# Patient Record
Sex: Male | Born: 1935 | Race: White | Hispanic: No | Marital: Married | Smoking: Former smoker
Health system: Southern US, Community
[De-identification: ages and names within clinical notes are randomized; demographics above are authoritative.]

## PROBLEM LIST (undated history)

## (undated) DIAGNOSIS — C801 Malignant (primary) neoplasm, unspecified: Secondary | ICD-10-CM

## (undated) DIAGNOSIS — I509 Heart failure, unspecified: Secondary | ICD-10-CM

## (undated) DIAGNOSIS — I82409 Acute embolism and thrombosis of unspecified deep veins of unspecified lower extremity: Secondary | ICD-10-CM

## (undated) DIAGNOSIS — D759 Disease of blood and blood-forming organs, unspecified: Secondary | ICD-10-CM

## (undated) DIAGNOSIS — I739 Peripheral vascular disease, unspecified: Secondary | ICD-10-CM

## (undated) DIAGNOSIS — K219 Gastro-esophageal reflux disease without esophagitis: Secondary | ICD-10-CM

## (undated) HISTORY — PX: BREAST SURGERY: SHX581

## (undated) HISTORY — PX: HERNIA REPAIR: SHX51

---

## 2005-04-05 ENCOUNTER — Ambulatory Visit: Payer: Self-pay | Admitting: Unknown Physician Specialty

## 2005-05-10 ENCOUNTER — Other Ambulatory Visit: Payer: Self-pay

## 2005-05-13 ENCOUNTER — Inpatient Hospital Stay: Payer: Self-pay | Admitting: Unknown Physician Specialty

## 2005-06-04 ENCOUNTER — Ambulatory Visit: Payer: Self-pay | Admitting: Surgery

## 2005-06-08 ENCOUNTER — Encounter: Payer: Self-pay | Admitting: Unknown Physician Specialty

## 2005-06-17 ENCOUNTER — Encounter: Payer: Self-pay | Admitting: Unknown Physician Specialty

## 2005-08-02 ENCOUNTER — Encounter: Payer: Self-pay | Admitting: Unknown Physician Specialty

## 2005-08-16 ENCOUNTER — Ambulatory Visit: Payer: Self-pay | Admitting: Urology

## 2005-08-27 ENCOUNTER — Ambulatory Visit: Payer: Self-pay | Admitting: Urology

## 2006-03-22 ENCOUNTER — Ambulatory Visit: Payer: Self-pay | Admitting: Urology

## 2006-07-18 ENCOUNTER — Ambulatory Visit: Payer: Self-pay | Admitting: Surgery

## 2007-02-09 ENCOUNTER — Ambulatory Visit: Payer: Self-pay | Admitting: Surgery

## 2007-02-14 ENCOUNTER — Ambulatory Visit: Payer: Self-pay | Admitting: Surgery

## 2007-06-27 ENCOUNTER — Ambulatory Visit: Payer: Self-pay | Admitting: Gastroenterology

## 2007-06-27 ENCOUNTER — Ambulatory Visit: Payer: Self-pay | Admitting: Unknown Physician Specialty

## 2007-12-07 IMAGING — CT CT ABDOMEN AND PELVIS WITHOUT AND WITH CONTRAST
2 of 4 series · 14 of 32 positions shown, 19 images · non-contrast
Comparison: none

REASON FOR EXAM: Renal Abnormalities seen on US [DATE] hyperechoic area
midpole of kidney
COMMENTS:

PROCEDURE:     CT  - CT ABDOMEN / PELVIS  W/WO  - August 27, 2005  [DATE]
RESULT:
REASON FOR CONSULTATION:   Renal abnormalities noted on Ultrasound of
08/16/05.

[Series 4: with · axial · 0.71mm/px · z∈[+282,+618]mm · 8 of 55 slices shown, 13 images]
[im 7/55  soft-tissue]
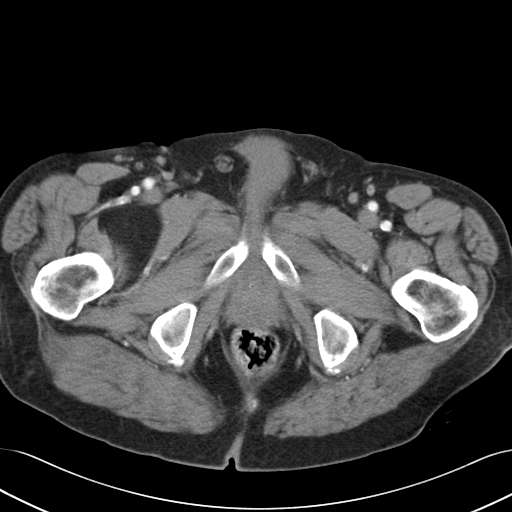
[im 7/55  bone]
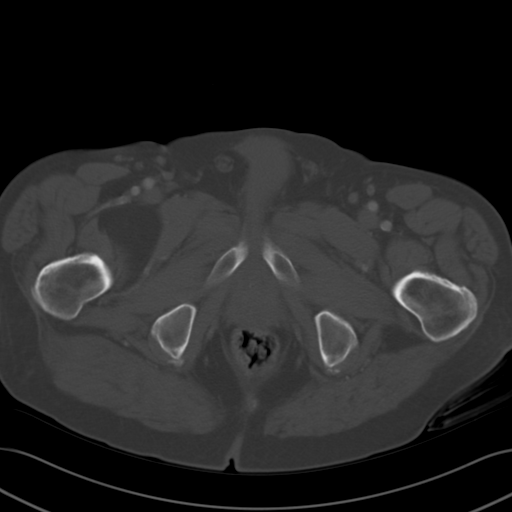
[im 13/55  soft-tissue]
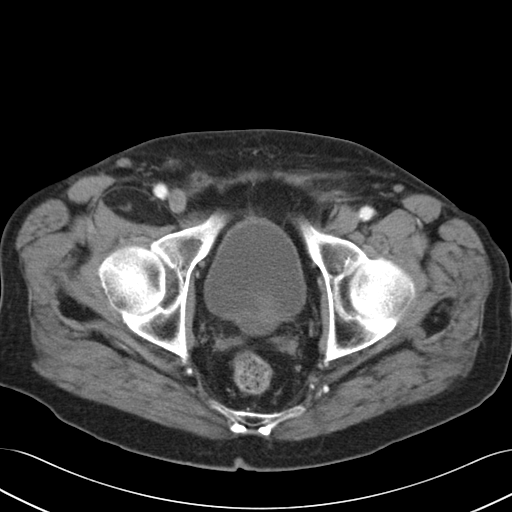
[im 19/55  soft-tissue]
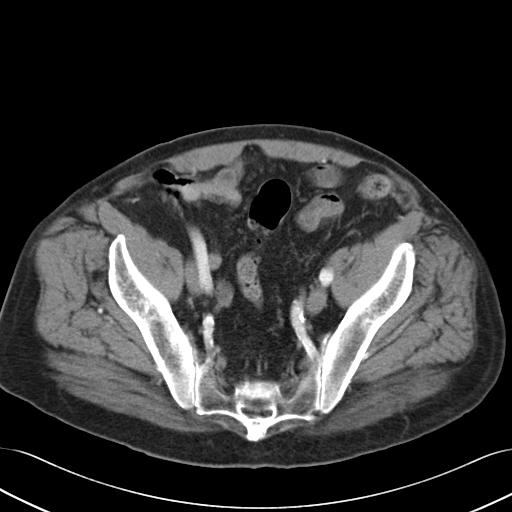
[im 25/55  soft-tissue]
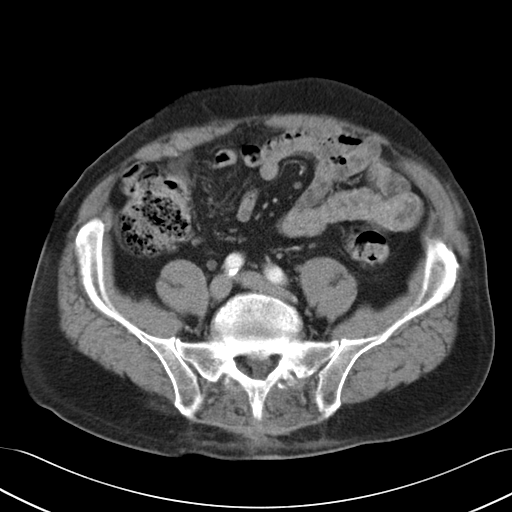
[im 31/55  soft-tissue]
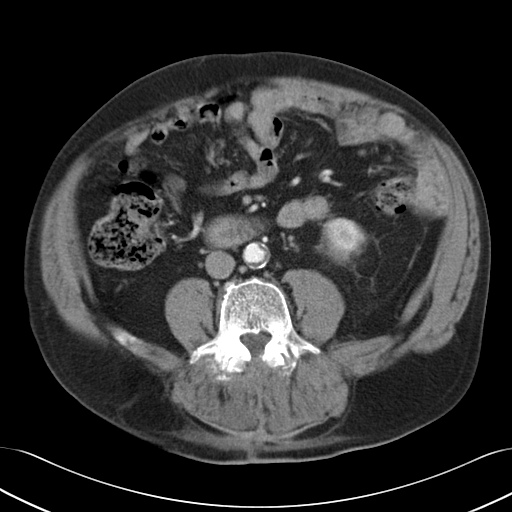
[im 31/55  lung]
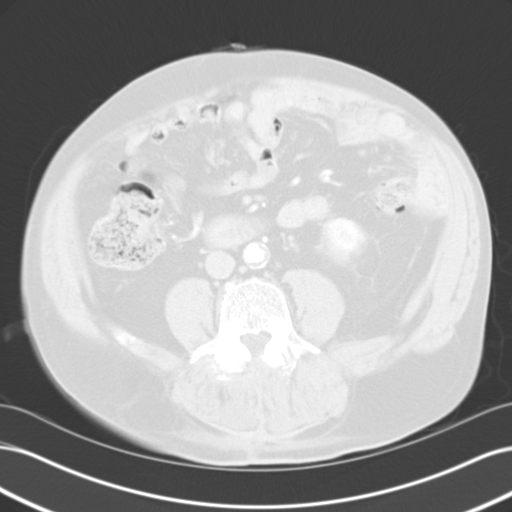
[im 37/55  soft-tissue]
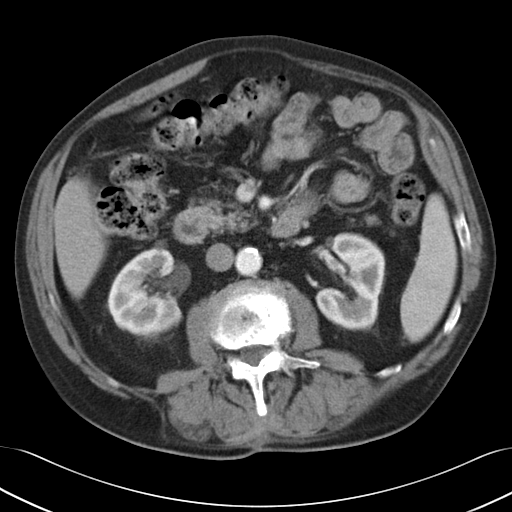
[im 37/55  lung]
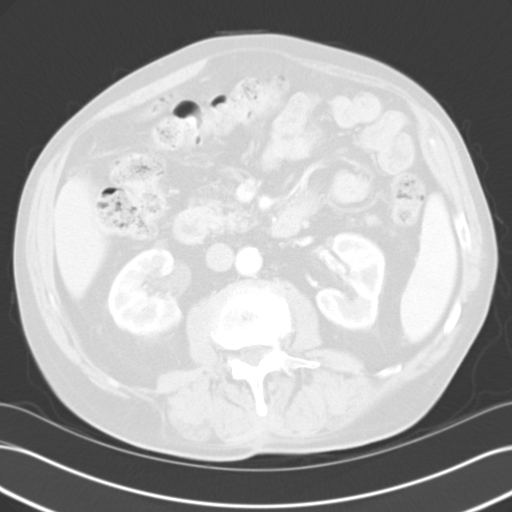
[im 43/55  soft-tissue]
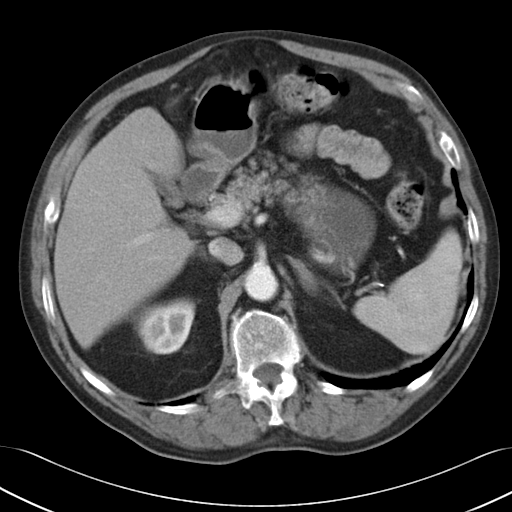
[im 43/55  lung]
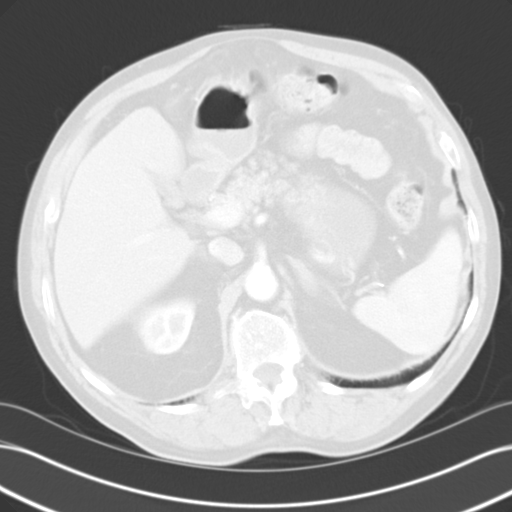
[im 49/55  soft-tissue]
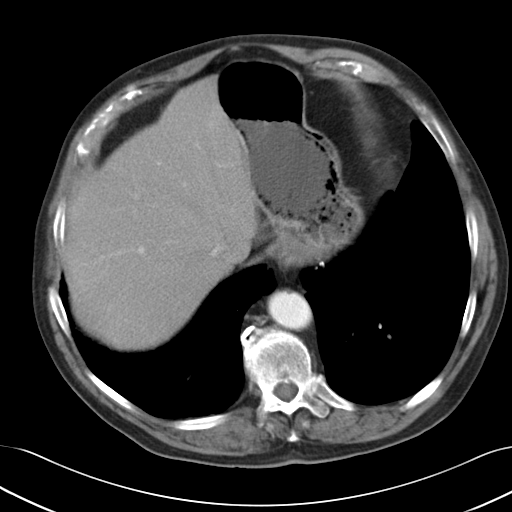
[im 49/55  lung]
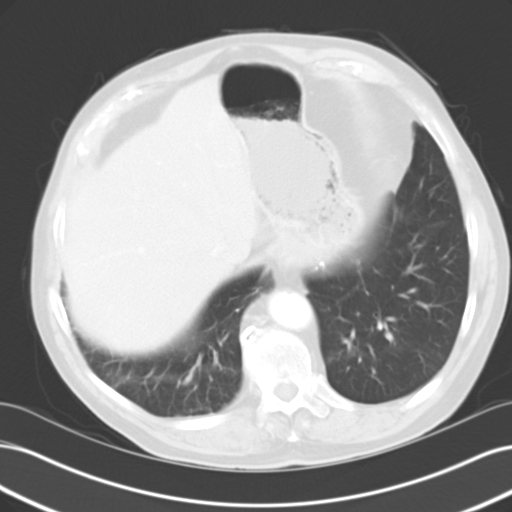

[Series 6: delay · axial · delayed · 0.71mm/px · z∈[+282,+522]mm · 6 of 55 slices shown]
[im 7/55  soft-tissue]
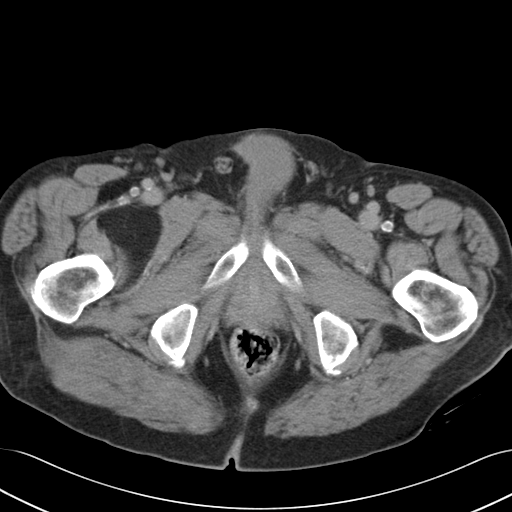
[im 13/55  soft-tissue]
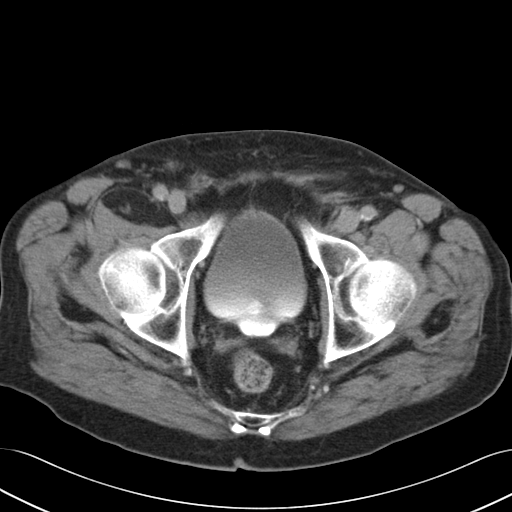
[im 19/55  soft-tissue]
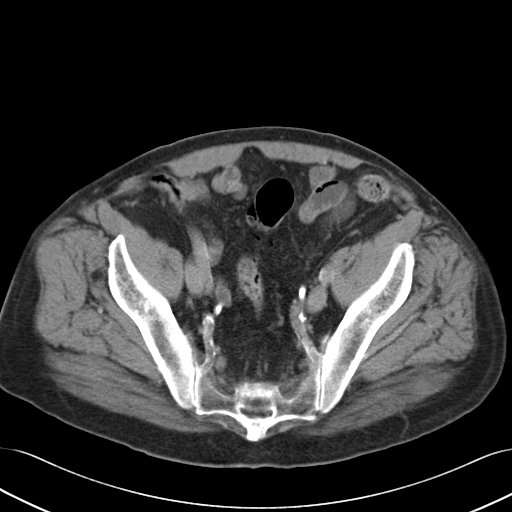
[im 25/55  soft-tissue]
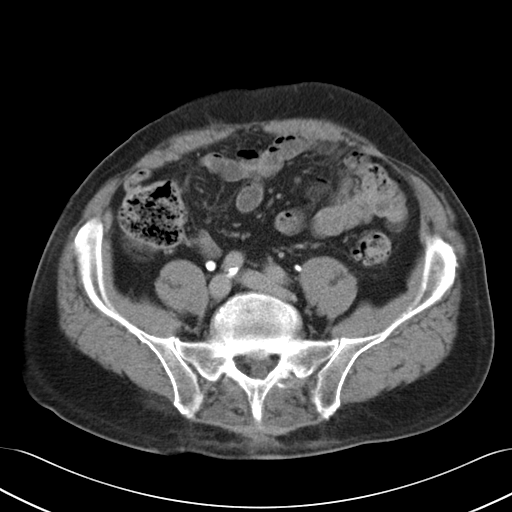
[im 31/55  soft-tissue]
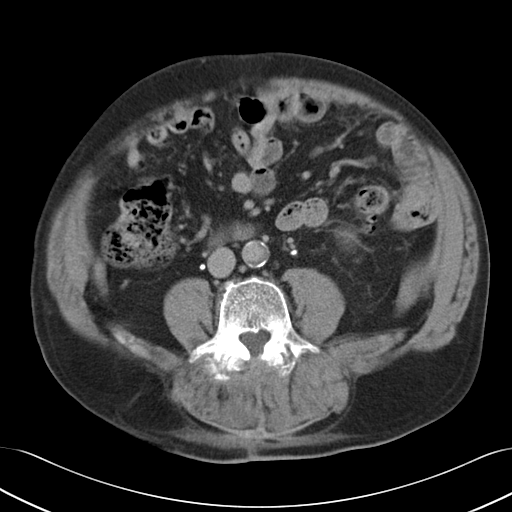
[im 37/55  soft-tissue]
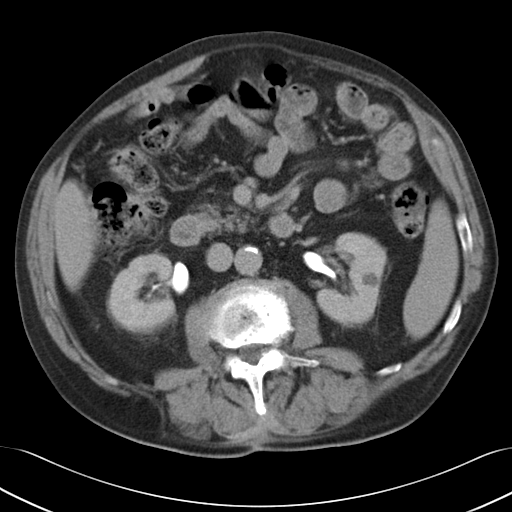

[14 of 32 positions shown; findings below may reference images not displayed]

FINDINGS: There is noted a 0.91 x 0.77 low attenuation area on the delayed
imaging in the midpole of the LEFT kidney.  It is barely perceptible on the
noncontrast study and CT attenuations of 38 on immediate post contrast  24
and 26 on the delayed images.  It does not represent a simple cyst. It does
not appear to enhance.  Follow-up CT or MR would be recommended.  There are
noted two smaller cysts in the RIGHT kidney, one exophytic in the midpole
anteriorly and one posteriorly in the renal pelvis.

Adrenal glands appear intact.  No retroperitoneal adenopathy is noted.
Pelvic adenopathy is seen.  There is incidentally noted a lipoma in the
distal iliacus on the RIGHT.  The liver and spleen appear intact.
IMPRESSION: Two small cysts identified in the RIGHT kidney.  Indeterminate small lesion
in the midpole of the LEFT kidney for which follow-up CT or MR would be
recommended.

## 2008-10-01 ENCOUNTER — Ambulatory Visit: Payer: Self-pay | Admitting: Urology

## 2009-11-07 ENCOUNTER — Ambulatory Visit: Payer: Self-pay | Admitting: Internal Medicine

## 2011-03-19 ENCOUNTER — Ambulatory Visit: Payer: Self-pay | Admitting: Internal Medicine

## 2011-08-02 ENCOUNTER — Ambulatory Visit: Payer: Self-pay | Admitting: Unknown Physician Specialty

## 2012-08-09 ENCOUNTER — Encounter: Payer: Self-pay | Admitting: Family Medicine

## 2012-08-17 ENCOUNTER — Encounter: Payer: Self-pay | Admitting: Family Medicine

## 2014-03-25 ENCOUNTER — Ambulatory Visit: Payer: Self-pay

## 2014-03-25 LAB — URINALYSIS, COMPLETE
BACTERIA: NEGATIVE
Bilirubin,UR: NEGATIVE
Blood: NEGATIVE
GLUCOSE, UR: NEGATIVE
KETONE: NEGATIVE
Leukocyte Esterase: NEGATIVE
NITRITE: NEGATIVE
Ph: 5 (ref 5.0–8.0)
SPECIFIC GRAVITY: 1.015 (ref 1.000–1.030)

## 2014-03-27 LAB — URINE CULTURE

## 2014-04-22 ENCOUNTER — Ambulatory Visit: Payer: Self-pay | Admitting: Family Medicine

## 2014-05-02 ENCOUNTER — Encounter: Payer: Self-pay | Admitting: General Surgery

## 2014-05-09 ENCOUNTER — Encounter: Payer: Self-pay | Admitting: Surgery

## 2014-05-20 ENCOUNTER — Encounter: Payer: Self-pay | Admitting: General Surgery

## 2014-05-20 ENCOUNTER — Encounter: Payer: Self-pay | Admitting: Surgery

## 2014-06-13 ENCOUNTER — Ambulatory Visit: Payer: Self-pay | Admitting: Emergency Medicine

## 2014-06-18 ENCOUNTER — Encounter
Admit: 2014-06-18 | Disposition: A | Discharge status: Home / Self Care | Payer: Self-pay | Attending: General Surgery | Admitting: General Surgery

## 2014-06-18 ENCOUNTER — Encounter
Admit: 2014-06-18 | Disposition: A | Discharge status: Home / Self Care | Payer: Self-pay | Attending: Surgery | Admitting: Surgery

## 2014-07-19 ENCOUNTER — Encounter
Admit: 2014-07-19 | Disposition: A | Discharge status: Home / Self Care | Payer: Self-pay | Attending: General Surgery | Admitting: General Surgery

## 2014-08-23 ENCOUNTER — Encounter: Payer: Medicare Other | Attending: Surgery | Admitting: Surgery

## 2014-08-23 DIAGNOSIS — I82401 Acute embolism and thrombosis of unspecified deep veins of right lower extremity: Secondary | ICD-10-CM | POA: Diagnosis not present

## 2014-08-23 DIAGNOSIS — I739 Peripheral vascular disease, unspecified: Secondary | ICD-10-CM | POA: Diagnosis not present

## 2014-08-23 DIAGNOSIS — I509 Heart failure, unspecified: Secondary | ICD-10-CM | POA: Insufficient documentation

## 2014-08-23 DIAGNOSIS — Z9221 Personal history of antineoplastic chemotherapy: Secondary | ICD-10-CM | POA: Insufficient documentation

## 2014-08-23 DIAGNOSIS — Y839 Surgical procedure, unspecified as the cause of abnormal reaction of the patient, or of later complication, without mention of misadventure at the time of the procedure: Secondary | ICD-10-CM | POA: Diagnosis not present

## 2014-08-23 DIAGNOSIS — S91301A Unspecified open wound, right foot, initial encounter: Secondary | ICD-10-CM | POA: Insufficient documentation

## 2014-08-23 DIAGNOSIS — C44722 Squamous cell carcinoma of skin of right lower limb, including hip: Secondary | ICD-10-CM | POA: Diagnosis not present

## 2014-08-23 NOTE — Progress Notes (Signed)
Philip [name] ([id number]) Visit Report for 08/23/2014 Chief Complaint Document Details Patient Name: Philip [name], Philip [name]. Date of Service: 08/23/2014 10:15 AM Medical Record Number: [id number] Patient Account Number: [id number] Date of Birth/Sex: 08-Jul-1918 (80 y.o. Male) Treating RN: Primary Care Physician: Hortencia Pilar Other Clinician: Referring Physician: Hortencia Pilar Treating Physician/Extender: Frann Rider in Treatment: 16 Information Obtained from: Patient Chief Complaint R foot ulcer. L forearm ulcer. 07/19/2014 -- about 2 weeks ago he had a surgical procedure done by dermatologist in [place] and has an open surgical wound on the dorsum of the right foot. Electronic Signature(s) Signed: 08/23/2014 12:08:58 PM By: Christin Fudge MD, FACS Entered By: Christin Fudge on 08/23/2014 10:50:26 Philip [name] ([id number]) -------------------------------------------------------------------------------- Debridement Details Patient Name: Philip [name]. Date of Service: 08/23/2014 10:15 AM Medical Record Number: [id number] Patient Account Number: [id number] Date of Birth/Sex: Jun 14, 1918 (81 y.o. Male) Treating RN: Primary Care Physician: Hortencia Pilar Other Clinician: Referring Physician: Hortencia Pilar Treating Physician/Extender: Frann Rider in Treatment: 16 Debridement Performed for Wound #3 Right,Dorsal Foot Assessment: Performed By: Physician Pat Patrick., MD Debridement: Open Wound/Selective Debridement Selective Description: Pre-procedure Yes Verification/Time Out Taken: Start Time: 10:40 Pain Control: Lidocaine 5% topical ointment Level: Non-Viable Tissue Total Area Debrided (L x 1 (cm) x 1 (cm) = 1 (cm) W): Tissue and other Non-Viable, Eschar, Exudate, Fibrin/Slough material debrided: Instrument: Forceps Bleeding: None End Time: 10:45 Procedural Pain: 2 Post Procedural Pain: 0 Response to Treatment: Procedure was tolerated well Post  Debridement Measurements of Total Wound Length: (cm) 1.1 Width: (cm) 1.1 Depth: (cm) 0.3 Volume: (cm) 0.285 Electronic Signature(s) Signed: 08/23/2014 12:08:58 PM By: Christin Fudge MD, FACS Entered By: Christin Fudge on 08/23/2014 10:50:17 Philip [name] ([id number]) -------------------------------------------------------------------------------- HPI Details Patient Name: Philip [name]. Date of Service: 08/23/2014 10:15 AM Medical Record Number: [id number] Patient Account Number: [id number] Date of Birth/Sex: December 25, 1918 (81 y.o. Male) Treating RN: Primary Care Physician: Hortencia Pilar Other Clinician: Referring Physician: Hortencia Pilar Treating Physician/Extender: Frann Rider in Treatment: 16 History of Present Illness Location: right leg Duration: Dec 2015 Modifying Factors: history of an injury to the right leg with resulting hematoma and thrombophlebitis and later an ulcer of posterior leg Associated Signs and Symptoms: marked lymphedema of the right leg. He is already on Eloquis. HPI Description: 06/14/14 -- He returns for followup today. He denies any fevers. no fresh issues and his daughter says he's been doing fine. 06/21/14 -- after he sustained a fall this week earlier he applied a bandage over this himself and did not seek any medical attention. he did however manage to control the bleeding and had a dressing in place the next morning when his son to the visit. In this dressing was removed there was further damaged skin. His right leg has been doing fine otherwise. 07/12/14 --Very pleasant 81 year old with past medical history significant for congestive heart failure (EF 15%), peripheral vascular disease, and chronic kidney disease. He was hospitalized at [facility] in December 2015 for congestive heart failure. He says that he fell during his hospital course and developed a hematoma over his right calf. He was also diagnosed with a right lower extremity DVT for which he  takes Eliquis. The hematoma subsequently turned into an ulceration around Christmas, which has healed. He subsequently developed an ulcer on his right dorsal foot and a traumatic left forearm ulcer. Per his report, he underwent biopsy of the right dorsal foot ulceration which demonstrated a skin cancer. His PCP and dermatologist office are both  closed today. I reviewed his records in [place] but find no report of biopsy or pathology. He s without complaints today. No significant pain. No fever or chills. Minimal drainage. 07/19/2014 - the patient and his son tell me that about 2 weeks ago the dermatologist did a skin biopsy and this was a large area on the dorsum of his right foot which was left open and no dressing instructions were recommended. Since then he has been called and told that it is a cancer and the need to do a further procedure but that will not happen until about 2 weeks from now. In the meanwhile the patient has not been taking care of his right foot. The left forearm where he had an abrasion and laceration is doing very well. 07/26/2014 -- Reports from 07/08/2014 from the dermatology group reviewed. A excision was done of a lesion located on the dorsum of the right foot and this was 1.7 cm in diameter which was a shave biopsy performed. The wound was left open after appropriate cauterization and the patient was given this dressing instructions. 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 08/02/2014 -- all the original problems he came with have completely resolved. He now has a surgical wound on his right foot dorsum where a skin cancer was excised. He goes to see his dermatologist this DASAN, HARDMAN ([id number]) coming Tuesday and will have definite news next Friday. 08/09/2014 he had gone to his dermatologist on Tuesday and she has injected the base of his ulcer with some  chemotherapeutic agent. He was supposed to bring some papers with him but forgot to get them and will bring them in next week. Other than that the dermatologist had suggested using Mehdi honey on the wound. 08/16/2014 -- the patient has brought in his notes from the dermatologist and on 08/06/2014 he received a injection of 5 FU, 500 mg grams into the lesion. the pathology report was also sent and it was a squamous cell carcinoma well-differentiated and edges were involved. They wanted him to use many honey for the wound dressing changes to be done 3 times a week. Electronic Signature(s) Signed: 08/23/2014 12:08:58 PM By: Christin Fudge MD, FACS Entered By: Christin Fudge on 08/23/2014 10:50:40 Philip [name] ([id number]) -------------------------------------------------------------------------------- Physical Exam Details Patient Name: MICHAIAH, HOLSOPPLE. Date of Service: 08/23/2014 10:15 AM Medical Record Number: [id number] Patient Account Number: [id number] Date of Birth/Sex: 1918/07/29 (81 y.o. Male) Treating RN: Primary Care Physician: Hortencia Pilar Other Clinician: Referring Physician: Hortencia Pilar Treating Physician/Extender: Frann Rider in Treatment: 16 Constitutional . Pulse regular. Respirations normal and unlabored. Afebrile. . Eyes Nonicteric. Reactive to light. Ears, Nose, Mouth, and Throat Lips, teeth, and gums WNL.Marland Kitchen Moist mucosa without lesions . Neck supple and nontender. No palpable supraclavicular or cervical adenopathy. Normal sized without goiter. Respiratory WNL. No retractions.. Cardiovascular Pedal Pulses WNL. No clubbing, cyanosis or edema. Integumentary (Hair, Skin) the ulcer base is down to the fascia on the dorsum of his foot and some of his is granulating now. Size of the wound has gotten smaller.Marland Kitchen Psychiatric Judgement and insight Intact.. No evidence of depression, anxiety, or agitation.. Electronic Signature(s) Signed: 08/23/2014 12:08:58 PM By:  Christin Fudge MD, FACS Entered By: Christin Fudge on 08/23/2014 10:51:25 Philip [name] ([id number]) -------------------------------------------------------------------------------- Physician Orders Details Patient Name: VISHAAL, STROLLO. Date of Service: 08/23/2014 10:15 AM Medical Record Number: [id number] Patient Account Number: [id number] Date of Birth/Sex:  October 26, 1918 (81 y.o. Male) Treating RN: Cornell Barman Primary Care Physician: Hortencia Pilar Other Clinician: Referring Physician: Hortencia Pilar Treating Physician/Extender: Frann Rider in Treatment: 44 Verbal / Phone Orders: Yes Clinician: Cornell Barman Read Back and Verified: Yes Diagnosis Coding Wound Cleansing Wound #3 Right,Dorsal Foot o Clean wound with Normal Saline. o May Shower, gently pat wound dry prior to applying new dressing. o May shower with protection. - DO NOT GET DRESSING WET. Anesthetic Wound #3 Right,Dorsal Foot o Topical Lidocaine 4% cream applied to wound bed prior to debridement Primary Wound Dressing Wound #3 Right,Dorsal Foot o Aquacel Ag Secondary Dressing Wound #3 Right,Dorsal Foot o Gauze and Kerlix/Conform Dressing Change Frequency Wound #3 Right,Dorsal Foot o Change dressing every other day. Follow-up Appointments Wound #3 Right,Dorsal Foot o Return Appointment in 1 week. Edema Control Wound #3 Right,Dorsal Foot o Tubigrip Home Health Wound #3 [place] Visits - [place] Nurse may visit PRN to address patientos wound care needs. JOEY, LIERMAN ([id number]) o FACE TO FACE ENCOUNTER: MEDICARE and MEDICAID PATIENTS: I certify that this patient is under my care and that I had a face-to-face encounter that meets the physician face-to-face encounter requirements with this patient on this date. The encounter with the patient was in whole or in part for the following MEDICAL CONDITION: (primary reason for [place]) MEDICAL NECESSITY: I certify, that based on my findings, NURSING services are a medically necessary home health service. HOME BOUND STATUS: I certify that my clinical findings support that this patient is homebound (i.e., Due to illness or injury, pt requires aid of supportive devices such as crutches, cane, wheelchairs, walkers, the use of special transportation or the assistance of another person to leave their place of residence. There is a normal inability to leave the home and doing so requires considerable and taxing effort. Other absences are for medical reasons / religious services and are infrequent or of short duration when for other reasons). o If current dressing causes regression in wound condition, may D/C ordered dressing product/s and apply Normal Saline Moist Dressing daily until next [place] / Other MD appointment. [place] of regression in wound condition at [phone]. o Please direct any NON-WOUND related issues/requests for orders to patient's Primary Care Physician Electronic Signature(s) Signed: 08/23/2014 12:08:58 PM By: Christin Fudge MD, FACS Signed: 08/23/2014 2:43:41 PM By: Gretta Cool RN, BSN, Kim RN, BSN Entered By: Gretta Cool, RN, BSN, Kim on 08/23/2014 10:45:31 Philip [name] ([id number]) -------------------------------------------------------------------------------- Problem List Details Patient Name: ELBRIDGE, MAGOWAN. Date of Service: 08/23/2014 10:15 AM Medical Record Number: [id number] Patient Account Number: [id number] Date of Birth/Sex: 10/08/1918 (81 y.o. Male) Treating RN: Primary Care Physician: Hortencia Pilar Other Clinician: Referring Physician: Hortencia Pilar Treating Physician/Extender: Frann Rider in Treatment: 16 Active Problems ICD-10 Encounter Code Description Active Date Diagnosis I70.232 Atherosclerosis of native arteries of right leg with 06/21/2014 Yes ulceration of calf I82.401 Acute  embolism and thrombosis of unspecified deep veins 06/21/2014 Yes of right lower extremity S91.301A Unspecified open wound, right foot, initial encounter 07/19/2014 Yes Z92.21 Personal history of antineoplastic chemotherapy 08/23/2014 Yes Inactive Problems Resolved Problems ICD-10 Code Description Active Date Resolved Date L97.212 Non-pressure chronic ulcer of right calf with fat layer 06/21/2014 06/21/2014 exposed S51.812A Laceration without foreign body of left forearm, initial 06/21/2014 06/21/2014 encounter Electronic Signature(s) Signed: 08/23/2014 12:08:58 PM By: Christin Fudge MD, FACS Entered By: Christin Fudge on 08/23/2014 10:48:47 Philip [name] ([id number]) Arlan Organ  F. ([id number]) -------------------------------------------------------------------------------- Progress Note Details Patient Name: ARMARION, GREEK. Date of Service: 08/23/2014 10:15 AM Medical Record Number: [id number] Patient Account Number: [id number] Date of Birth/Sex: 25-Sep-1918 (81 y.o. Male) Treating RN: Primary Care Physician: Hortencia Pilar Other Clinician: Referring Physician: Hortencia Pilar Treating Physician/Extender: Frann Rider in Treatment: 16 Subjective Chief Complaint Information obtained from Patient R foot ulcer. L forearm ulcer. 07/19/2014 -- about 2 weeks ago he had a surgical procedure done by dermatologist in [place] and has an open surgical wound on the dorsum of the right foot. History of Present Illness (HPI) The following HPI elements were documented for the patient's wound: Location: right leg Duration: Dec 2015 Modifying Factors: history of an injury to the right leg with resulting hematoma and thrombophlebitis and later an ulcer of posterior leg Associated Signs and Symptoms: marked lymphedema of the right leg. He is already on Eloquis. 06/14/14 -- He returns for followup today. He denies any fevers. no fresh issues and his daughter says he's been doing fine. 06/21/14 -- after he  sustained a fall this week earlier he applied a bandage over this himself and did not seek any medical attention. he did however manage to control the bleeding and had a dressing in place the next morning when his son to the visit. In this dressing was removed there was further damaged skin. His right leg has been doing fine otherwise. 07/12/14 --Very pleasant 81 year old with past medical history significant for congestive heart failure (EF 15%), peripheral vascular disease, and chronic kidney disease. He was hospitalized at [facility] in December 2015 for congestive heart failure. He says that he fell during his hospital course and developed a hematoma over his right calf. He was also diagnosed with a right lower extremity DVT for which he takes Eliquis. The hematoma subsequently turned into an ulceration around Christmas, which has healed. He subsequently developed an ulcer on his right dorsal foot and a traumatic left forearm ulcer. Per his report, he underwent biopsy of the right dorsal foot ulceration which demonstrated a skin cancer. His PCP and dermatologist office are both closed today. I reviewed his records in [place] but find no report of biopsy or pathology. He s without complaints today. No significant pain. No fever or chills. Minimal drainage. 07/19/2014 - the patient and his son tell me that about 2 weeks ago the dermatologist did a skin biopsy and this was a large area on the dorsum of his right foot which was left open and no dressing instructions were recommended. Since then he has been called and told that it is a cancer and the need to do a further procedure but that will not happen until about 2 weeks from now. In the meanwhile the patient has not been taking care of his right foot. The left forearm where he had an abrasion and laceration is doing very well. RUPERT, AZZARA ([id number]) 07/26/2014 -- Reports from 07/08/2014 from the dermatology group reviewed. A excision was done of  a lesion located on the dorsum of the right foot and this was 1.7 cm in diameter which was a shave biopsy performed. The wound was left open after appropriate cauterization and the patient was given this dressing instructions. 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 08/02/2014 -- all the original problems he came with have completely resolved. He now has a surgical wound on his right foot dorsum where a skin cancer was excised.  He goes to see his dermatologist this coming Tuesday and will have definite news next Friday. 08/09/2014 he had gone to his dermatologist on Tuesday and she has injected the base of his ulcer with some chemotherapeutic agent. He was supposed to bring some papers with him but forgot to get them and will bring them in next week. Other than that the dermatologist had suggested using Mehdi honey on the wound. 08/16/2014 -- the patient has brought in his notes from the dermatologist and on 08/06/2014 he received a injection of 5 FU, 500 mg grams into the lesion. the pathology report was also sent and it was a squamous cell carcinoma well-differentiated and edges were involved. They wanted him to use many honey for the wound dressing changes to be done 3 times a week. Objective Constitutional Pulse regular. Respirations normal and unlabored. Afebrile. Vitals Time Taken: 10:29 AM, Height: 69 in, Weight: 179 lbs, BMI: 26.4, Temperature: 97.8 F, Pulse: 59 bpm, Respiratory Rate: 18 breaths/min, Blood Pressure: 119/36 mmHg. Eyes Nonicteric. Reactive to light. Ears, Nose, Mouth, and Throat Lips, teeth, and gums WNL.Marland Kitchen Moist mucosa without lesions . Neck supple and nontender. No palpable supraclavicular or cervical adenopathy. Normal sized without goiter. Respiratory WNL. No retractions.. Cardiovascular Pedal Pulses WNL. No clubbing, cyanosis or edema. ARNOLD, KESTER  ([id number]) Psychiatric Judgement and insight Intact.. No evidence of depression, anxiety, or agitation.. Integumentary (Hair, Skin) the ulcer base is down to the fascia on the dorsum of his foot and some of his is granulating now. Size of the wound has gotten smaller.. Wound #3 status is Open. Original cause of wound was Surgical Injury. The wound is located on the Right,Dorsal Foot. The wound measures 1.1cm length x 1.1cm width x 0.3cm depth; 0.95cm^2 area and 0.285cm^3 volume. The wound is limited to skin breakdown. There is no tunneling or undermining noted. There is a small amount of serosanguineous drainage noted. The wound margin is distinct with the outline attached to the wound base. There is no granulation within the wound bed. There is a large (67-100%) amount of necrotic tissue within the wound bed including Adherent Slough. The periwound skin appearance exhibited: Localized Edema, Moist. The periwound skin appearance did not exhibit: Callus, Crepitus, Excoriation, Fluctuance, Friable, Induration, Rash, Scarring, Dry/Scaly, Maceration, Atrophie Blanche, Cyanosis, Ecchymosis, Hemosiderin Staining, Mottled, Pallor, Rubor, Erythema. Periwound temperature was noted as No Abnormality. 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 I82.401 - Acute embolism and thrombosis of unspecified deep veins of right lower extremity S91.301A - Unspecified open wound, right foot, initial encounter Z92.21 - Personal history of antineoplastic chemotherapy His wound is doing very well with the silver alginate and we will continue to use this. He does understand that because of his injection with chemotherapy locally the wound healing is going to be a bit slow. He receives his next injection of 5-FU next week. He will come back and see [place] in a week's time. Procedures ANDRUE, DINI ([id number]) Wound #3 Wound #3 is a Malignant  Wound located on the Right,Dorsal Foot . There was a Non-Viable Tissue Open Wound/Selective [phone]) debridement with total area of 1 sq cm performed by Sawsan Riggio, Jackson Latino., MD. with the following instrument(s): Forceps to remove Non-Viable tissue/material including Exudate, Fibrin/Slough, and Eschar after achieving pain control using Lidocaine 5% topical ointment. A time out was conducted prior to the start of the procedure. There was no bleeding. The procedure was tolerated well with a pain  level of 2 throughout and a pain level of 0 following the procedure. Post Debridement Measurements: 1.1cm length x 1.1cm width x 0.3cm depth; 0.285cm^3 volume. Plan Wound Cleansing: Wound #3 Right,Dorsal Foot: Clean wound with Normal Saline. May Shower, gently pat wound dry prior to applying new dressing. May shower with protection. - DO NOT GET DRESSING WET. Anesthetic: Wound #3 Right,Dorsal Foot: Topical Lidocaine 4% cream applied to wound bed prior to debridement Primary Wound Dressing: Wound #3 Right,Dorsal Foot: Aquacel Ag Secondary Dressing: Wound #3 Right,Dorsal Foot: Gauze and Kerlix/Conform Dressing Change Frequency: Wound #3 Right,Dorsal Foot: Change dressing every other day. Follow-up Appointments: Wound #3 Right,Dorsal Foot: Return Appointment in 1 week. Edema Control: Wound #3 Right,Dorsal Foot: Tubigrip Home Health: Wound #3 Right,Dorsal Foot: [place] Visits - [place] Nurse may visit PRN to address patient s wound care needs. FACE TO FACE ENCOUNTER: MEDICARE and MEDICAID PATIENTS: I certify that this patient is under my care and that I had a face-to-face encounter that meets the physician face-to-face encounter requirements with this patient on this date. The encounter with the patient was in whole or in part for the following MEDICAL CONDITION: (primary reason for [place]) MEDICAL NECESSITY: I certify, that based on my findings, NURSING  services are a medically necessary home health service. HOME BOUND STATUS: I certify that my clinical findings support that this patient is homebound (i.e., Due to illness or injury, pt requires aid of supportive devices such as crutches, cane, wheelchairs, walkers, the use TRINI, CHRISTIANSEN. ([phone]) of special transportation or the assistance of another person to leave their place of residence. There is a normal inability to leave the home and doing so requires considerable and taxing effort. Other absences are for medical reasons / religious services and are infrequent or of short duration when for other reasons). If current dressing causes regression in wound condition, may D/C ordered dressing product/s and apply Normal Saline Moist Dressing daily until next [place] / Other MD appointment. [place] of regression in wound condition at [phone]. Please direct any NON-WOUND related issues/requests for orders to patient's Primary Care Physician His wound is doing very well with the silver alginate and we will continue to use this. He does understand that because of his injection with chemotherapy locally the wound healing is going to be a bit slow. He receives his next injection of 5-FU next week. He will come back and see [place] in a week's time. Electronic Signature(s) Signed: 08/23/2014 12:08:58 PM By: Christin Fudge MD, FACS Entered By: Christin Fudge on 08/23/2014 10:52:26

## 2014-08-23 NOTE — Progress Notes (Signed)
BERND, CROM ([id number]) Visit Report for 08/23/2014 Arrival Information Details Patient Name: Philip [name], Philip [name]. Date of Service: 08/23/2014 10:15 AM Medical Record Number: [id number] Patient Account Number: [id number] Date of Birth/Sex: [birth year] (81 y.o. Male) Treating RN: Montey Hora Primary Care Physician: Hortencia Pilar Other Clinician: Referring Physician: Hortencia Pilar Treating Physician/Extender: Frann Rider in Treatment: 16 Visit Information History Since Last Visit Added or deleted any medications: No Patient Arrived: Cane Any new allergies or adverse reactions: No Arrival Time: 10:27 Had a fall or experienced change in No Accompanied By: cg activities of daily living that may affect Transfer Assistance: None risk of falls: Patient Identification Verified: Yes Signs or symptoms of abuse/neglect since last No Secondary Verification Process Yes visito Completed: Hospitalized since last visit: No Patient Requires Transmission- No Pain Present Now: No Based Precautions: Patient Has Alerts: Yes Patient Alerts: Patient on Blood Thinner Electronic Signature(s) Signed: 08/23/2014 2:47:10 PM By: Montey Hora Entered By: Montey Hora on 08/23/2014 10:28:21 [name] ([id number]) -------------------------------------------------------------------------------- Encounter Discharge Information Details Patient Name: Philip [name], Philip [name]. Date of Service: 08/23/2014 10:15 AM Medical Record Number: [id number] Patient Account Number: [id number] Date of Birth/Sex: [birth year] (81 y.o. Male) Treating RN: Montey Hora Primary Care Physician: Hortencia Pilar Other Clinician: Referring Physician: Hortencia Pilar Treating Physician/Extender: Frann Rider in Treatment: 16 Encounter Discharge Information Items Discharge Pain Level: 0 Discharge Condition: Stable Ambulatory Status: Cane Discharge Destination: Home Transportation: Private Auto Accompanied By:  Mady Gemma Schedule Follow-up Appointment: Yes Medication Reconciliation completed No and provided to Patient/Care Osborne Serio: Provided on Clinical Summary of Care: 08/23/2014 Form Type Recipient Paper Patient GT Electronic Signature(s) Signed: 08/23/2014 10:57:13 AM By: Ruthine Dose Entered By: Ruthine Dose on 08/23/2014 10:57:13 [name] ([id number]) -------------------------------------------------------------------------------- Lower Extremity Assessment Details Patient Name: Philip [name], Philip [name]. Date of Service: 08/23/2014 10:15 AM Medical Record Number: [id number] Patient Account Number: [id number] Date of Birth/Sex: 1918-09-05 (81 y.o. Male) Treating RN: Montey Hora Primary Care Physician: Hortencia Pilar Other Clinician: Referring Physician: Hortencia Pilar Treating Physician/Extender: Frann Rider in Treatment: 16 Edema Assessment Assessed: [Left: No] [Right: No] Edema: [Left: Ye] [Right: s] Vascular Assessment Pulses: Posterior Tibial Palpable: [Right:Yes] Dorsalis Pedis Palpable: [Right:Yes] Extremity colors, hair growth, and conditions: Extremity Color: [Right:Normal] Hair Growth on Extremity: [Right:No] Temperature of Extremity: [Right:Warm] Capillary Refill: [Right:< 3 seconds] Toe Nail Assessment Left: Right: Thick: Yes Discolored: Yes Deformed: No Improper Length and Hygiene: No Electronic Signature(s) Signed: 08/23/2014 2:47:10 PM By: Montey Hora Entered By: Montey Hora on 08/23/2014 10:32:50 [name] ([id number]) -------------------------------------------------------------------------------- Multi Wound Chart Details Patient Name: [name]. Date of Service: 08/23/2014 10:15 AM Medical Record Number: [id number] Patient Account Number: [id number] Date of Birth/Sex: 04/26/1918 (81 y.o. 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] [N/A:N/A] Comorbid History: [3:Cataracts, Arrhythmia, Congestive Heart Failure] [N/A:N/A] Date Acquired: [3:07/01/2014] [N/A:N/A] Weeks of Treatment: [3:6] [N/A:N/A] Wound Status: [3:Open] [N/A:N/A] Measurements L x W x D 1.1x1.1x0.3 [N/A:N/A] (cm) Area (cm) : [3:0.95] [N/A:N/A] Volume (cm) : [3:0.285] [N/A:N/A] % Reduction in Area: [3:49.60%] [N/A:N/A] % Reduction in Volume: 62.20% [N/A:N/A] Classification: [3:Full Thickness Without Exposed Support Structures] [N/A:N/A] Exudate Amount: [3:Small] [N/A:N/A] Exudate Type: [3:Serosanguineous] [N/A:N/A] Exudate Color: [  3:red, brown] [N/A:N/A] Wound Margin: [3:Distinct, outline attached] [N/A:N/A] Granulation Amount: [3:None Present (0%)] [N/A:N/A] Necrotic Amount: [3:Large (67-100%)] [N/A:N/A] Exposed Structures: [3:Fascia: No Fat: No Tendon: No Muscle: No Joint: No Bone: No] [N/A:N/A] Limited to Skin Breakdown Epithelialization: Small (1-33%) N/A N/A Periwound Skin Texture: Edema: Yes N/A N/A Excoriation: No Induration: No Callus: No Crepitus: No Fluctuance: No Friable: No Rash: No Scarring: No Periwound Skin Moist: Yes N/A N/A Moisture: Maceration: No Dry/Scaly: No Periwound Skin Color: Atrophie Blanche: No N/A N/A Cyanosis: No Ecchymosis: No Erythema: No Hemosiderin Staining: No Mottled: No Pallor: No Rubor: No Temperature: No Abnormality N/A N/A Tenderness on No N/A N/A Palpation: Wound Preparation: Ulcer Cleansing: N/A N/A Rinsed/Irrigated with Saline Topical Anesthetic Applied: Other:  lidocaine 4% Treatment Notes Electronic Signature(s) Signed: 08/23/2014 2:43:41 PM By: Gretta Cool, RN, BSN, Kim RN, BSN Entered By: Gretta Cool, RN, BSN, Kim on 08/23/2014 10:41:14 [name] ([id number]) -------------------------------------------------------------------------------- [place] Plan Details Patient Name: Philip [name], Philip [name]. Date of Service: 08/23/2014 10:15 AM Medical Record Number: [id number] Patient Account Number: [id number] Date of Birth/Sex: 1919/03/18 (81 y.o. Male) Treating RN: Cornell Barman Primary Care Physician: Hortencia Pilar Other Clinician: Referring Physician: Hortencia Pilar Treating Physician/Extender: Frann Rider in Treatment: 42 Active Inactive Abuse / Safety / Falls / Self Care Management Nursing Diagnoses: Abuse or neglect; actual or potential Potential for falls Goals: Patient will remain injury free Date Initiated: 05/02/2014 Goal Status: Active Interventions: Assess fall risk on admission and as needed Notes: Necrotic Tissue Nursing Diagnoses: Impaired tissue integrity related to necrotic/devitalized tissue Goals: Necrotic/devitalized tissue will be minimized in the wound bed Date Initiated: 05/02/2014 Goal Status: Active Interventions: Assess patient pain level pre-, during and post procedure and prior to discharge Treatment Activities: Apply topical anesthetic as ordered : 08/23/2014 Notes: Orientation to the Wound Care Program Nursing Diagnoses: Knowledge deficit related to the wound healing center program JAXXEN, VOONG ([id number]) Goals: Patient/caregiver will verbalize understanding of the [facility] Program Date Initiated: 05/02/2014 Goal Status: Active Interventions: Provide education on orientation to the wound center Notes: Electronic Signature(s) Signed: 08/23/2014 2:43:41 PM By: Gretta Cool, RN, BSN, Kim RN, BSN Entered By: Gretta Cool, RN, BSN, Kim on 08/23/2014 10:41:04 [name]  ([id number]) -------------------------------------------------------------------------------- Patient/Caregiver Education Details Patient Name: Philip [name], Philip [name]. Date of Service: 08/23/2014 10:15 AM Medical Record Number: [id number] Patient Account Number: [id number] Date of Birth/Gender: 1918-05-03 (81 y.o. Male) Treating RN: Montey Hora Primary Care Physician: Hortencia Pilar Other Clinician: Referring Physician: Hortencia Pilar Treating Physician/Extender: Frann Rider in Treatment: 16 Education Assessment Education Provided To: Patient and Caregiver Education Topics Provided Wound/Skin Impairment: Handouts: Caring for Your Ulcer, Skin Care Do's and Dont's Methods: Demonstration, Explain/Verbal Responses: State content correctly Electronic Signature(s) Signed: 08/23/2014 2:47:10 PM By: Montey Hora Entered By: Montey Hora on 08/23/2014 10:53:22 [name] ([id number]) -------------------------------------------------------------------------------- Wound Assessment Details Patient Name: Philip [name], Philip [name]. Date of Service: 08/23/2014 10:15 AM Medical Record Number: [id number] Patient Account Number: [id number] Date of Birth/Sex: 06/02/1918 (81 y.o. Male) Treating RN: Montey Hora Primary Care Physician: Hortencia Pilar Other Clinician: Referring Physician: Hortencia Pilar Treating Physician/Extender: Frann Rider in Treatment: 16 Wound Status Wound Number: 3 Primary Malignant Wound Etiology: Wound Location: Right Foot - Dorsal Wound Status: Open Wounding Event: Surgical Injury Comorbid Cataracts, Arrhythmia, Congestive Date Acquired: 07/01/2014 History: Heart Failure Weeks Of Treatment: 6 Clustered Wound: No Photos Photo Uploaded By: Montey Hora on 08/23/2014 13:55:24 Wound Measurements Length: (cm) 1.1 Width: (cm) 1.1 Depth: (cm) 0.3 Area: (cm) 0.95 Volume: (cm)  0.285 % Reduction in Area: 49.6% % Reduction in Volume:  62.2% Epithelialization: Small (1-33%) Tunneling: No Undermining: No Wound Description Full Thickness Without Exposed Classification: Support Structures Wound Margin: Distinct, outline attached Exudate Small Amount: Exudate Type: Serosanguineous Exudate Color: red, brown Foul Odor After Cleansing: No Wound Bed Granulation Amount: None Present (0%) Exposed Structure Necrotic Amount: Large (67-100%) Fascia Exposed: No Necrotic Quality: Adherent Slough Fat Layer Exposed: No Philip [name], Philip [name] ([id number]) Tendon Exposed: No Muscle Exposed: No Joint Exposed: No Bone Exposed: No Limited to Skin Breakdown Periwound Skin Texture Texture Color No Abnormalities Noted: No No Abnormalities Noted: No Callus: No Atrophie Blanche: No Crepitus: No Cyanosis: No Excoriation: No Ecchymosis: No Fluctuance: No Erythema: No Friable: No Hemosiderin Staining: No Induration: No Mottled: No Localized Edema: Yes Pallor: No Rash: No Rubor: No Scarring: No Temperature / Pain Moisture Temperature: No Abnormality No Abnormalities Noted: No Dry / Scaly: No Maceration: No Moist: Yes Wound Preparation Ulcer Cleansing: Rinsed/Irrigated with Saline Topical Anesthetic Applied: Other: lidocaine 4%, Treatment Notes Wound #3 (Right, Dorsal Foot) 1. Cleansed with: Clean wound with Normal Saline 2. Anesthetic Topical Lidocaine 4% cream to wound bed prior to debridement 3. Peri-wound Care: Skin Prep 4. Dressing Applied: Aquacel Ag 5. Secondary Dressing Applied Bordered Foam Dressing 7. Secured with Tubigrip Electronic Signature(s) Signed: 08/23/2014 2:47:10 PM By: Montey Hora Entered By: Montey Hora on 08/23/2014 10:36:48 Philip [name], Philip [name] ([id number], CRISCO ([id number]) -------------------------------------------------------------------------------- [place] Details Patient Name: Philip [name], Philip [name]. Date of Service: 08/23/2014 10:15 AM Medical Record Number:  [id number] Patient Account Number: [id number] Date of Birth/Sex: 04/26/1918 (81 y.o. Male) Treating RN: Montey Hora Primary Care Physician: Hortencia Pilar Other Clinician: Referring Physician: Hortencia Pilar Treating Physician/Extender: Frann Rider in Treatment: 16 Vital Signs Time Taken: 10:29 Temperature (F): 97.8 Height (in): 69 Pulse (bpm): 59 Weight (lbs): 179 Respiratory Rate (breaths/min): 18 Body Mass Index (BMI): 26.4 Blood Pressure (mmHg): 119/36 Reference Range: 80 - 120 mg / dl Electronic Signature(s) Signed: 08/23/2014 2:47:10 PM By: Montey Hora Entered By: Montey Hora on 08/23/2014 10:31:46

## 2014-08-30 ENCOUNTER — Encounter: Payer: Medicare Other | Admitting: Surgery

## 2014-08-30 DIAGNOSIS — S91301A Unspecified open wound, right foot, initial encounter: Secondary | ICD-10-CM | POA: Diagnosis not present

## 2014-08-30 NOTE — Progress Notes (Signed)
JAMERE, STIDHAM ([id number]) Visit Report for 08/30/2014 Arrival Information Details Patient Name: Philip [name], Philip [name]. Date of Service: 08/30/2014 9:30 AM Medical Record Number: [id number] Patient Account Number: [id number] Date of Birth/Sex: Sep 16, 1918 (81 y.o. Male) Treating RN: Cornell Barman Primary Care Physician: Hortencia Pilar Other Clinician: Referring Physician: Hortencia Pilar Treating Physician/Extender: Frann Rider in Treatment: 3 Visit Information History Since Last Visit Added or deleted any medications: No Patient Arrived: Philip [name] Any new allergies or adverse reactions: No Arrival Time: 09:40 Had a fall or experienced change in No Accompanied By: CAREGIVER activities of daily living that may affect Transfer Assistance: None risk of falls: Patient Identification Verified: Yes Signs or symptoms of abuse/neglect since last No Secondary Verification Process Yes visito Completed: Hospitalized since last visit: No Patient Requires Transmission- No Has Dressing in Place as Prescribed: Yes Based Precautions: Pain Present Now: No Patient Has Alerts: Yes Patient Alerts: Patient on Blood Thinner Electronic Signature(s) Signed: 08/30/2014 3:41:50 PM By: Gretta Cool, RN, BSN, Kim RN, BSN Entered By: Gretta Cool, RN, BSN, Kim on 08/30/2014 09:41:14 [name] ([id number]) -------------------------------------------------------------------------------- Encounter Discharge Information Details Patient Name: Philip [name], Philip [name]. Date of Service: 08/30/2014 9:30 AM Medical Record Number: [id number] Patient Account Number: [id number] Date of Birth/Sex: Jul 03, 1918 (81 y.o. Male) Treating RN: Cornell Barman Primary Care Physician: Hortencia Pilar Other Clinician: Referring Physician: Hortencia Pilar Treating Physician/Extender: Frann Rider in Treatment: 39 Encounter Discharge Information Items Discharge Pain Level: 0 Discharge Condition: Stable Ambulatory Status: Cane Discharge  Destination: Home Transportation: Private Auto [employer], Accompanied By: caregiver Schedule Follow-up Appointment: Yes Medication Reconciliation completed Yes and provided to Patient/Care Myrna Vonseggern: Provided on Clinical Summary of Care: 08/30/2014 Form Type Recipient Paper Patient GT Electronic Signature(s) Signed: 08/30/2014 10:06:11 AM By: Ruthine Dose Entered By: Ruthine Dose on 08/30/2014 10:06:11 [name] ([id number]) -------------------------------------------------------------------------------- Lower Extremity Assessment Details Patient Name: Philip [name]. Date of Service: 08/30/2014 9:30 AM Medical Record Number: [id number] Patient Account Number: [id number] Date of Birth/Sex: [birth year] (81 y.o. Male) Treating RN: Cornell Barman Primary Care Physician: Hortencia Pilar Other Clinician: Referring Physician: Hortencia Pilar Treating Physician/Extender: Frann Rider in Treatment: 17 Edema Assessment Assessed: [Left: No] [Right: No] Edema: [Left: N] [Right: o] Vascular Assessment Pulses: Posterior Tibial Palpable: [Right:Yes] Dorsalis Pedis Palpable: [Right:Yes] Extremity colors, hair growth, and conditions: Extremity Color: [Right:Mottled] Hair Growth on Extremity: [Right:Yes] Temperature of Extremity: [Right:Cool] Capillary Refill: [Right:< 3 seconds] Toe Nail Assessment Left: Right: Thick: Yes Discolored: Yes Deformed: No Improper Length and Hygiene: No Electronic Signature(s) Signed: 08/30/2014 3:41:50 PM By: Gretta Cool, RN, BSN, Kim RN, BSN Entered By: Gretta Cool, RN, BSN, Kim on 08/30/2014 09:46:16 [name] ([id number]) -------------------------------------------------------------------------------- Multi Wound Chart Details Patient Name: Philip [name], Philip [name]. Date of Service: 08/30/2014 9:30 AM Medical Record Number: [id number] Patient Account Number: [id number] Date of Birth/Sex: [birth year] (81 y.o. 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] [N/A:N/A] Comorbid History: [3:Cataracts, Arrhythmia, Congestive Heart Failure] [N/A:N/A] Date Acquired: [3:07/01/2014] [N/A:N/A] Weeks of Treatment: [3:7] [N/A:N/A] Wound Status: [3:Open] [N/A:N/A] Measurements L x W x D 1.1x0.9x0.3 [N/A:N/A] (cm) Area (cm) : [3:0.778] [N/A:N/A] Volume (cm) : [3:0.233] [N/A:N/A] % Reduction in Area: [3:58.70%] [N/A:N/A] % Reduction in Volume:  69.10% [N/A:N/A] Classification: [3:Full Thickness Without Exposed Support Structures] [N/A:N/A] Exudate Amount: [3:Small] [N/A:N/A] Exudate Type: [3:Serosanguineous] [N/A:N/A] Exudate Color: [3:red, brown] [N/A:N/A] Wound Margin: [3:Distinct, outline attached] [N/A:N/A] Granulation Amount: [3:None Present (0%)] [N/A:N/A] Necrotic Amount: [3:Large (67-100%)] [N/A:N/A] Exposed Structures: [3:Fascia: No Fat: No Tendon: No Muscle: No Joint: No Bone: No] [N/A:N/A] Limited to Skin Breakdown Epithelialization: Small (1-33%) N/A N/A Periwound Skin Texture: Edema: No N/A N/A Excoriation: No Induration: No Callus: No Crepitus: No Fluctuance: No Friable: No Rash: No Scarring: No Periwound Skin Maceration: Yes N/A N/A Moisture: Moist: Yes Dry/Scaly: No Periwound Skin Color: Atrophie Blanche: No N/A N/A Cyanosis: No Ecchymosis: No Erythema: No Hemosiderin Staining: No Mottled: No Pallor: No Rubor: No Temperature: No Abnormality N/A N/A Tenderness on No N/A N/A Palpation: Wound Preparation: Ulcer Cleansing: N/A  N/A Rinsed/Irrigated with Saline Topical Anesthetic Applied: Other: lidocaine 4% Treatment Notes Electronic Signature(s) Signed: 08/30/2014 3:41:50 PM By: Gretta Cool, RN, BSN, Kim RN, BSN Entered By: Gretta Cool, RN, BSN, Kim on 08/30/2014 09:49:13 [name] ([id number]) -------------------------------------------------------------------------------- Multi-Disciplinary Care Plan Details Patient Name: Philip [name], Philip [name]. Date of Service: 08/30/2014 9:30 AM Medical Record Number: [id number] Patient Account Number: [id number] Date of Birth/Sex: 09/11/1918 (81 y.o. Male) Treating RN: Cornell Barman Primary Care Physician: Hortencia Pilar Other Clinician: Referring Physician: Hortencia Pilar Treating Physician/Extender: Frann Rider in Treatment: 45 Active Inactive Abuse / Safety / Falls / Self Care Management Nursing Diagnoses: Abuse or neglect; actual or potential Potential for falls Goals: Patient will remain injury free Date Initiated: 05/02/2014 Goal Status: Active Interventions: Assess fall risk on admission and as needed Notes: Necrotic Tissue Nursing Diagnoses: Impaired tissue integrity related to necrotic/devitalized tissue Goals: Necrotic/devitalized tissue will be minimized in the wound bed Date Initiated: 05/02/2014 Goal Status: Active Interventions: Assess patient pain level pre-, during and post procedure and prior to discharge Treatment Activities: Apply topical anesthetic as ordered : 08/30/2014 Notes: Orientation to the Wound Care Program Nursing Diagnoses: Knowledge deficit related to the wound healing center program SANI, MADARIAGA ([id number]) Goals: Patient/caregiver will verbalize understanding of the [facility] Program Date Initiated: 05/02/2014 Goal Status: Active Interventions: Provide education on orientation to the wound center Notes: Electronic Signature(s) Signed: 08/30/2014 3:41:50 PM By: Gretta Cool, RN, BSN, Kim RN, BSN Entered By: Gretta Cool, RN,  BSN, Kim on 08/30/2014 09:48:24 [name] ([id number]) -------------------------------------------------------------------------------- Pain Assessment Details Patient Name: Philip [name], Philip [name]. Date of Service: 08/30/2014 9:30 AM Medical Record Number: [id number] Patient Account Number: [id number] Date of Birth/Sex: 01/24/1919 (81 y.o. Male) Treating RN: Cornell Barman Primary Care Physician: Hortencia Pilar Other Clinician: Referring Physician: Hortencia Pilar Treating Physician/Extender: Frann Rider in Treatment: 17 Active Problems Location of Pain Severity and Description of Pain Patient Has Paino No Site Locations Pain Management and Medication Current Pain Management: Electronic Signature(s) Signed: 08/30/2014 3:41:50 PM By: Gretta Cool, RN, BSN, Kim RN, BSN Entered By: Gretta Cool, RN, BSN, Kim on 08/30/2014 09:41:22 [name] ([id number]) -------------------------------------------------------------------------------- Patient/Caregiver Education Details Patient Name: WESLIE, PRETLOW. Date of Service: 08/30/2014 9:30 AM Medical Record Number: [id number] Patient Account Number: [id number] Date of Birth/Gender: January 19, 1919 (81 y.o. Male) Treating RN: Cornell Barman Primary Care Physician: Hortencia Pilar Other Clinician: Referring Physician: Hortencia Pilar Treating Physician/Extender: Frann Rider in Treatment: 61 Education Assessment Education Provided To: Patient Education Topics Provided Wound/Skin Impairment: Handouts: Caring for Your Ulcer, Skin Care Do's and Dont's Methods: Demonstration Responses: State content correctly Electronic Signature(s) Signed: 08/30/2014 3:41:50 PM By: Gretta Cool, RN, BSN, Kim RN, BSN Entered By: Gretta Cool, RN, BSN,  Kim on 08/30/2014 10:05:59 Philip [name], Philip [name] ([id number]) -------------------------------------------------------------------------------- Wound Assessment Details Patient Name: Philip [name], Philip [name]. Date of Service: 08/30/2014 9:30  AM Medical Record Number: [id number] Patient Account Number: [id number] Date of Birth/Sex: 03/23/1919 (81 y.o. Male) Treating RN: Cornell Barman Primary Care Physician: Hortencia Pilar Other Clinician: Referring Physician: Hortencia Pilar Treating Physician/Extender: Frann Rider in Treatment: 17 Wound Status Wound Number: 3 Primary Malignant Wound Etiology: Wound Location: Right Foot - Dorsal Wound Status: Open Wounding Event: Surgical Injury Comorbid Cataracts, Arrhythmia, Congestive Date Acquired: 07/01/2014 History: Heart Failure Weeks Of Treatment: 7 Clustered Wound: No Photos Photo Uploaded By: Gretta Cool, RN, BSN, Kim on 08/30/2014 10:43:28 Wound Measurements Length: (cm) 1.1 Width: (cm) 0.9 Depth: (cm) 0.3 Area: (cm) 0.778 Volume: (cm) 0.233 % Reduction in Area: 58.7% % Reduction in Volume: 69.1% Epithelialization: Small (1-33%) Tunneling: No Undermining: No Wound Description Full Thickness Without Exposed Classification: Support Structures Wound Margin: Distinct, outline attached Exudate Small Amount: Exudate Type: Serosanguineous Exudate Color: red, brown Foul Odor After Cleansing: No Wound Bed Granulation Amount: None Present (0%) Exposed Structure Necrotic Amount: Large (67-100%) Fascia Exposed: No Necrotic Quality: Adherent Slough Fat Layer Exposed: No CARROLL, LINGELBACH ([id number]) Tendon Exposed: No Muscle Exposed: No Joint Exposed: No Bone Exposed: No Limited to Skin Breakdown Periwound Skin Texture Texture Color No Abnormalities Noted: No No Abnormalities Noted: No Callus: No Atrophie Blanche: No Crepitus: No Cyanosis: No Excoriation: No Ecchymosis: No Fluctuance: No Erythema: No Friable: No Hemosiderin Staining: No Induration: No Mottled: No Localized Edema: No Pallor: No Rash: No Rubor: No Scarring: No Temperature / Pain Moisture Temperature: No Abnormality No Abnormalities Noted: No Dry / Scaly: No Maceration: Yes Moist:  Yes Wound Preparation Ulcer Cleansing: Rinsed/Irrigated with Saline Topical Anesthetic Applied: Other: lidocaine 4%, Treatment Notes Wound #3 (Right, Dorsal Foot) 1. Cleansed with: Clean wound with Normal Saline 2. Anesthetic Topical Lidocaine 4% cream to wound bed prior to debridement 4. Dressing Applied: Aquacel Ag 5. Secondary Dressing Applied Bordered Foam Dressing Electronic Signature(s) Signed: 08/30/2014 3:41:50 PM By: Gretta Cool, RN, BSN, Kim RN, BSN Entered By: Gretta Cool, RN, BSN, Kim on 08/30/2014 09:47:15 [name] ([id number]) -------------------------------------------------------------------------------- [place] Details Patient Name: Philip [name], Philip [name]. Date of Service: 08/30/2014 9:30 AM Medical Record Number: [id number] Patient Account Number: [id number] Date of Birth/Sex: Feb 13, 1919 (81 y.o. Male) Treating RN: Cornell Barman Primary Care Physician: Hortencia Pilar Other Clinician: Referring Physician: Hortencia Pilar Treating Physician/Extender: Frann Rider in Treatment: 17 Vital Signs Time Taken: 09:42 Temperature (F): 98 Height (in): 69 Pulse (bpm): 62 Weight (lbs): 179 Respiratory Rate (breaths/min): 18 Body Mass Index (BMI): 26.4 Blood Pressure (mmHg): 124/39 Reference Range: 80 - 120 mg / dl Electronic Signature(s) Signed: 08/30/2014 3:41:50 PM By: Gretta Cool, RN, BSN, Kim RN, BSN Entered By: Gretta Cool, RN, BSN, Kim on 08/30/2014 09:45:03

## 2014-08-30 NOTE — Progress Notes (Signed)
LEDARIUS, LEESON ([id number]) Visit Report for 08/30/2014 Chief Complaint Document Details Patient Name: Philip [name], Philip [name]. Date of Service: 08/30/2014 9:30 AM Medical Record Number: [id number] Patient Account Number: [id number] Date of Birth/Sex: 02-17-1919 (81 y.o. Male) Treating RN: Primary Care Physician: Hortencia Pilar Other Clinician: Referring Physician: Hortencia Pilar Treating Physician/Extender: Frann Rider in Treatment: 31 Information Obtained from: Patient Chief Complaint R foot ulcer. L forearm ulcer. 07/19/2014 -- about 2 weeks ago he had a surgical procedure done by dermatologist in [place] and has an open surgical wound on the dorsum of the right foot. Electronic Signature(s) Signed: 08/30/2014 12:47:20 PM By: Christin Fudge MD, FACS Entered By: Christin Fudge on 08/30/2014 10:04:11 Philip [name] ([id number]) -------------------------------------------------------------------------------- Debridement Details Patient Name: Philip [name]. Date of Service: 08/30/2014 9:30 AM Medical Record Number: [id number] Patient Account Number: [id number] Date of Birth/Sex: [birth year] (81 y.o. Male) Treating RN: Primary Care Physician: Hortencia Pilar Other Clinician: Referring Physician: Hortencia Pilar Treating Physician/Extender: Frann Rider in Treatment: 17 Debridement Performed for Wound #3 Right,Dorsal Foot Assessment: Performed By: Physician Pat Patrick., MD Debridement: Debridement Pre-procedure Yes Verification/Time Out Taken: Start Time: 09:54 Pain Control: Lidocaine 5% topical ointment Level: Skin/Subcutaneous Tissue Total Area Debrided (L x 1.1 (cm) x 0.9 (cm) = 0.99 (cm) W): Tissue and other Viable, Non-Viable, Eschar, Fibrin/Slough, Subcutaneous material debrided: Instrument: Curette Bleeding: Minimum End Time: 09:58 Procedural Pain: 0 Post Procedural Pain: 0 Response to Treatment: Procedure was tolerated well Post Debridement Measurements  of Total Wound Length: (cm) 1.1 Width: (cm) 0.9 Depth: (cm) 0.3 Volume: (cm) 0.233 Electronic Signature(s) Signed: 08/30/2014 12:47:20 PM By: Christin Fudge MD, FACS Entered By: Christin Fudge on 08/30/2014 10:04:04 Philip [name] ([id number]) -------------------------------------------------------------------------------- HPI Details Patient Name: Philip [name]. Date of Service: 08/30/2014 9:30 AM Medical Record Number: [id number] Patient Account Number: [id number] Date of Birth/Sex: [birth year] (81 y.o. Male) Treating RN: Primary Care Physician: Hortencia Pilar Other Clinician: Referring Physician: Hortencia Pilar Treating Physician/Extender: Frann Rider in Treatment: 17 History of Present Illness Location: right leg Duration: Dec 2015 Modifying Factors: history of an injury to the right leg with resulting hematoma and thrombophlebitis and later an ulcer of posterior leg Associated Signs and Symptoms: marked lymphedema of the right leg. He is already on Eloquis. HPI Description: 06/14/14 -- He returns for followup today. He denies any fevers. no fresh issues and his daughter says he's been doing fine. 06/21/14 -- after he sustained a fall this week earlier he applied a bandage over this himself and did not seek any medical attention. he did however manage to control the bleeding and had a dressing in place the next morning when his son to the visit. In this dressing was removed there was further damaged skin. His right leg has been doing fine otherwise. 07/12/14 --Very pleasant 81 year old with past medical history significant for congestive heart failure (EF 15%), peripheral vascular disease, and chronic kidney disease. He was hospitalized at [facility] in December 2015 for congestive heart failure. He says that he fell during his hospital course and developed a hematoma over his right calf. He was also diagnosed with a right lower extremity DVT for which he takes Eliquis.  The hematoma subsequently turned into an ulceration around Christmas, which has healed. He subsequently developed an ulcer on his right dorsal foot and a traumatic left forearm ulcer. Per his report, he underwent biopsy of the right dorsal foot ulceration which demonstrated a skin cancer. His PCP and dermatologist office are both closed today. I  reviewed his records in [place] but find no report of biopsy or pathology. He s without complaints today. No significant pain. No fever or chills. Minimal drainage. 07/19/2014 - the patient and his son tell me that about 2 weeks ago the dermatologist did a skin biopsy and this was a large area on the dorsum of his right foot which was left open and no dressing instructions were recommended. Since then he has been called and told that it is a cancer and the need to do a further procedure but that will not happen until about 2 weeks from now. In the meanwhile the patient has not been taking care of his right foot. The left forearm where he had an abrasion and laceration is doing very well. 07/26/2014 -- Reports from 07/08/2014 from the dermatology group reviewed. A excision was done of a lesion located on the dorsum of the right foot and this was 1.7 cm in diameter which was a shave biopsy performed. The wound was left open after appropriate cauterization and the patient was given this dressing instructions. 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 08/02/2014 -- all the original problems he came with have completely resolved. He now has a surgical wound on his right foot dorsum where a skin cancer was excised. He goes to see his dermatologist this [name], Philip [name] ([id number]) coming Tuesday and will have definite news next Friday. 08/09/2014 he had gone to his dermatologist on Tuesday and she has injected the base of his ulcer with some chemotherapeutic agent. He  was supposed to bring some papers with him but forgot to get them and will bring them in next week. Other than that the dermatologist had suggested using Mehdi honey on the wound. 08/16/2014 -- the patient has brought in his notes from the dermatologist and on 08/06/2014 he received a injection of 5 FU, 500 mg grams into the lesion. the pathology report was also sent and it was a squamous cell carcinoma well-differentiated and edges were involved. They wanted him to use many honey for the wound dressing changes to be done 3 times a week. 08/30/2014 -- he has finished his second injection of 5-FU and has the next one in 2 weeks' time. He is doing fine otherwise. Electronic Signature(s) Signed: 08/30/2014 12:47:20 PM By: Christin Fudge MD, FACS Entered By: Christin Fudge on 08/30/2014 10:04:41 Philip [name] ([id number]) -------------------------------------------------------------------------------- Physical Exam Details Patient Name: ANDREE, GOLPHIN. Date of Service: 08/30/2014 9:30 AM Medical Record Number: [id number] Patient Account Number: [id number] Date of Birth/Sex: 1918-11-25 (81 y.o. Male) Treating RN: Primary Care Physician: Hortencia Pilar Other Clinician: Referring Physician: Hortencia Pilar Treating Physician/Extender: Frann Rider in Treatment: 17 Constitutional . Pulse regular. Respirations normal and unlabored. Afebrile. . Eyes Nonicteric. Reactive to light. Ears, Nose, Mouth, and Throat Lips, teeth, and gums WNL.Marland Kitchen Moist mucosa without lesions . Neck supple and nontender. No palpable supraclavicular or cervical adenopathy. Normal sized without goiter. Respiratory WNL. No retractions.. Cardiovascular Pedal Pulses WNL. No clubbing, cyanosis or edema. Integumentary (Hair, Skin) the ulcerated area on the dorsum of his right foot has some slough and is down to his fascia.. No crepitus or fluctuance. No peri-wound warmth or erythema. No masses.Marland Kitchen Psychiatric Judgement  and insight Intact.. No evidence of depression, anxiety, or agitation.. Electronic Signature(s) Signed: 08/30/2014 12:47:20 PM By: Christin Fudge MD, FACS Entered By: Christin Fudge on 08/30/2014 10:05:28 Philip [name] ([id number]) -------------------------------------------------------------------------------- Physician Orders  Details Patient Name: DHRUV, CHRISTINA. Date of Service: 08/30/2014 9:30 AM Medical Record Number: [id number] Patient Account Number: [id number] Date of Birth/Sex: Jul 31, 1918 (81 y.o. Male) Treating RN: Cornell Barman Primary Care Physician: Hortencia Pilar Other Clinician: Referring Physician: Hortencia Pilar Treating Physician/Extender: Frann Rider in Treatment: 44 Verbal / Phone Orders: Yes Clinician: Cornell Barman Read Back and Verified: Yes Diagnosis Coding Wound Cleansing Wound #3 Right,Dorsal Foot o Clean wound with Normal Saline. o May Shower, gently pat wound dry prior to applying new dressing. o May shower with protection. - DO NOT GET DRESSING WET. Anesthetic Wound #3 Right,Dorsal Foot o Topical Lidocaine 4% cream applied to wound bed prior to debridement Primary Wound Dressing Wound #3 Right,Dorsal Foot o Aquacel Ag Secondary Dressing Wound #3 Right,Dorsal Foot o Gauze and Kerlix/Conform Dressing Change Frequency Wound #3 Right,Dorsal Foot o Change dressing every other day. Follow-up Appointments Wound #3 Right,Dorsal Foot o Return Appointment in 1 week. Home Health Wound #3 [place] Visits - [place] Nurse may visit PRN to address patientos wound care needs. o FACE TO FACE ENCOUNTER: MEDICARE and MEDICAID PATIENTS: I certify that this patient is under my care and that I had a face-to-face encounter that meets the physician face-to-face encounter requirements with this patient on this date. The encounter with the patient was in whole or in part for the following MEDICAL  CONDITION: (primary reason for [place]) MEDICAL NECESSITY: I certify, that based on my findings, NURSING services are a medically ANFERNEE, PESCHKE ([id number]) necessary home health service. HOME BOUND STATUS: I certify that my clinical findings support that this patient is homebound (i.e., Due to illness or injury, pt requires aid of supportive devices such as crutches, cane, wheelchairs, walkers, the use of special transportation or the assistance of another person to leave their place of residence. There is a normal inability to leave the home and doing so requires considerable and taxing effort. Other absences are for medical reasons / religious services and are infrequent or of short duration when for other reasons). o If current dressing causes regression in wound condition, may D/C ordered dressing product/s and apply Normal Saline Moist Dressing daily until next [place] / Other MD appointment. [place] of regression in wound condition at [phone]. o Please direct any NON-WOUND related issues/requests for orders to patient's Primary Care Physician Electronic Signature(s) Signed: 08/30/2014 12:47:20 PM By: Christin Fudge MD, FACS Signed: 08/30/2014 3:41:50 PM By: Gretta Cool RN, BSN, Kim RN, BSN Entered By: Gretta Cool, RN, BSN, Kim on 08/30/2014 09:58:05 Philip [name] ([id number]) -------------------------------------------------------------------------------- Problem List Details Patient Name: GERONIMO, DILIBERTO. Date of Service: 08/30/2014 9:30 AM Medical Record Number: [id number] Patient Account Number: [id number] Date of Birth/Sex: [birth year] (81 y.o. Male) Treating RN: Primary Care Physician: Hortencia Pilar Other Clinician: Referring Physician: Hortencia Pilar Treating Physician/Extender: Frann Rider in Treatment: 17 Active Problems ICD-10 Encounter Code Description Active Date Diagnosis I70.232 Atherosclerosis of native arteries of  right leg with 06/21/2014 Yes ulceration of calf I82.401 Acute embolism and thrombosis of unspecified deep veins 06/21/2014 Yes of right lower extremity S91.301A Unspecified open wound, right foot, initial encounter 07/19/2014 Yes Z92.21 Personal history of antineoplastic chemotherapy 08/23/2014 Yes Inactive Problems Resolved Problems ICD-10 Code Description Active Date Resolved Date L97.212 Non-pressure chronic ulcer of right calf with fat layer 06/21/2014 06/21/2014 exposed S51.812A Laceration without foreign body of left forearm, initial 06/21/2014 06/21/2014 encounter Electronic Signature(s) Signed: 08/30/2014 12:47:20 PM By: Christin Fudge  MD, FACS Entered By: Christin Fudge on 08/30/2014 10:02:51 Philip [name] ([id number], FALLIN ([id number]) -------------------------------------------------------------------------------- Progress Note Details Patient Name: GIOVAN, PINSKY. Date of Service: 08/30/2014 9:30 AM Medical Record Number: [id number] Patient Account Number: [id number] Date of Birth/Sex: March 07, 1919 (81 y.o. Male) Treating RN: Primary Care Physician: Hortencia Pilar Other Clinician: Referring Physician: Hortencia Pilar Treating Physician/Extender: Frann Rider in Treatment: 17 Subjective Chief Complaint Information obtained from Patient R foot ulcer. L forearm ulcer. 07/19/2014 -- about 2 weeks ago he had a surgical procedure done by dermatologist in [place] and has an open surgical wound on the dorsum of the right foot. History of Present Illness (HPI) The following HPI elements were documented for the patient's wound: Location: right leg Duration: Dec 2015 Modifying Factors: history of an injury to the right leg with resulting hematoma and thrombophlebitis and later an ulcer of posterior leg Associated Signs and Symptoms: marked lymphedema of the right leg. He is already on Eloquis. 06/14/14 -- He returns for followup today. He denies any fevers. no fresh issues and  his daughter says he's been doing fine. 06/21/14 -- after he sustained a fall this week earlier he applied a bandage over this himself and did not seek any medical attention. he did however manage to control the bleeding and had a dressing in place the next morning when his son to the visit. In this dressing was removed there was further damaged skin. His right leg has been doing fine otherwise. 07/12/14 --Very pleasant 81 year old with past medical history significant for congestive heart failure (EF 15%), peripheral vascular disease, and chronic kidney disease. He was hospitalized at [facility] in December 2015 for congestive heart failure. He says that he fell during his hospital course and developed a hematoma over his right calf. He was also diagnosed with a right lower extremity DVT for which he takes Eliquis. The hematoma subsequently turned into an ulceration around Christmas, which has healed. He subsequently developed an ulcer on his right dorsal foot and a traumatic left forearm ulcer. Per his report, he underwent biopsy of the right dorsal foot ulceration which demonstrated a skin cancer. His PCP and dermatologist office are both closed today. I reviewed his records in [place] but find no report of biopsy or pathology. He s without complaints today. No significant pain. No fever or chills. Minimal drainage. 07/19/2014 - the patient and his son tell me that about 2 weeks ago the dermatologist did a skin biopsy and this was a large area on the dorsum of his right foot which was left open and no dressing instructions were recommended. Since then he has been called and told that it is a cancer and the need to do a further procedure but that will not happen until about 2 weeks from now. In the meanwhile the patient has not been taking care of his right foot. The left forearm where he had an abrasion and laceration is doing very well. HOLLISTER, WESSLER ([id number]) 07/26/2014 -- Reports from 07/08/2014  from the dermatology group reviewed. A excision was done of a lesion located on the dorsum of the right foot and this was 1.7 cm in diameter which was a shave biopsy performed. The wound was left open after appropriate cauterization and the patient was given this dressing instructions. 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 08/02/2014 -- all the original problems he came with have completely resolved. He now  has a surgical wound on his right foot dorsum where a skin cancer was excised. He goes to see his dermatologist this coming Tuesday and will have definite news next Friday. 08/09/2014 he had gone to his dermatologist on Tuesday and she has injected the base of his ulcer with some chemotherapeutic agent. He was supposed to bring some papers with him but forgot to get them and will bring them in next week. Other than that the dermatologist had suggested using Mehdi honey on the wound. 08/16/2014 -- the patient has brought in his notes from the dermatologist and on 08/06/2014 he received a injection of 5 FU, 500 mg grams into the lesion. the pathology report was also sent and it was a squamous cell carcinoma well-differentiated and edges were involved. They wanted him to use many honey for the wound dressing changes to be done 3 times a week. 08/30/2014 -- he has finished his second injection of 5-FU and has the next one in 2 weeks' time. He is doing fine otherwise. Objective Constitutional Pulse regular. Respirations normal and unlabored. Afebrile. Vitals Time Taken: 9:42 AM, Height: 69 in, Weight: 179 lbs, BMI: 26.4, Temperature: 98 F, Pulse: 62 bpm, Respiratory Rate: 18 breaths/min, Blood Pressure: 124/39 mmHg. Eyes Nonicteric. Reactive to light. Ears, Nose, Mouth, and Throat Lips, teeth, and gums WNL.Marland Kitchen Moist mucosa without lesions . Neck supple and nontender. No palpable supraclavicular  or cervical adenopathy. Normal sized without goiter. Respiratory WNL. No retractions.Philip [name] ([id number]) Cardiovascular Pedal Pulses WNL. No clubbing, cyanosis or edema. Psychiatric Judgement and insight Intact.. No evidence of depression, anxiety, or agitation.. Integumentary (Hair, Skin) the ulcerated area on the dorsum of his right foot has some slough and is down to his fascia.. No crepitus or fluctuance. No peri-wound warmth or erythema. No masses.. Wound #3 status is Open. Original cause of wound was Surgical Injury. The wound is located on the Right,Dorsal Foot. The wound measures 1.1cm length x 0.9cm width x 0.3cm depth; 0.778cm^2 area and 0.233cm^3 volume. The wound is limited to skin breakdown. There is no tunneling or undermining noted. There is a small amount of serosanguineous drainage noted. The wound margin is distinct with the outline attached to the wound base. There is no granulation within the wound bed. There is a large (67-100%) amount of necrotic tissue within the wound bed including Adherent Slough. The periwound skin appearance exhibited: Maceration, Moist. The periwound skin appearance did not exhibit: Callus, Crepitus, Excoriation, Fluctuance, Friable, Induration, Localized Edema, Rash, Scarring, Dry/Scaly, Atrophie Blanche, Cyanosis, Ecchymosis, Hemosiderin Staining, Mottled, Pallor, Rubor, Erythema. Periwound temperature was noted as No Abnormality. 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 I82.401 - Acute embolism and thrombosis of unspecified deep veins of right lower extremity S91.301A - Unspecified open wound, right foot, initial encounter Z92.21 - Personal history of antineoplastic chemotherapy After sharp debridement with a curette he has some granulation tissue but most of the slough has still a little way to go. I will use silver alginate for another week and we will  continue local care. He will come back and see [place] next week. Procedures ADEM, [place] ([id number]) Wound #3 Wound #3 is a Malignant Wound located on the Right,Dorsal Foot . There was a Skin/Subcutaneous Tissue Debridement ([id number]) debridement with total area of 0.99 sq cm performed by Shamica Moree, Jackson Latino., MD. with the following instrument(s): Curette to remove Viable and Non-Viable tissue/material including Fibrin/Slough, Eschar, and Subcutaneous after achieving pain  control using Lidocaine 5% topical ointment. A time out was conducted prior to the start of the procedure. A Minimum amount of bleeding was controlled with N/A. The procedure was tolerated well with a pain level of 0 throughout and a pain level of 0 following the procedure. Post Debridement Measurements: 1.1cm length x 0.9cm width x 0.3cm depth; 0.233cm^3 volume. Plan Wound Cleansing: Wound #3 Right,Dorsal Foot: Clean wound with Normal Saline. May Shower, gently pat wound dry prior to applying new dressing. May shower with protection. - DO NOT GET DRESSING WET. Anesthetic: Wound #3 Right,Dorsal Foot: Topical Lidocaine 4% cream applied to wound bed prior to debridement Primary Wound Dressing: Wound #3 Right,Dorsal Foot: Aquacel Ag Secondary Dressing: Wound #3 Right,Dorsal Foot: Gauze and Kerlix/Conform Dressing Change Frequency: Wound #3 Right,Dorsal Foot: Change dressing every other day. Follow-up Appointments: Wound #3 Right,Dorsal Foot: Return Appointment in 1 week. Home Health: Wound #3 Right,Dorsal Foot: [place] Visits - [place] Nurse may visit PRN to address patient s wound care needs. FACE TO FACE ENCOUNTER: MEDICARE and MEDICAID PATIENTS: I certify that this patient is under my care and that I had a face-to-face encounter that meets the physician face-to-face encounter requirements with this patient on this date. The encounter with the patient was in whole or in part for  the following MEDICAL CONDITION: (primary reason for [place]) MEDICAL NECESSITY: I certify, that based on my findings, NURSING services are a medically necessary home health service. HOME BOUND STATUS: I certify that my clinical findings support that this patient is homebound (i.e., Due to illness or injury, pt requires aid of supportive devices such as crutches, cane, wheelchairs, walkers, the use of special transportation or the assistance of another person to leave their place of residence. There is a normal inability to leave the home and doing so requires considerable and taxing effort. Other absences are [place]ER[place], U[place] ([id number]) for medical reasons / religious services and are infrequent or of short duration when for other reasons). If current dressing causes regression in wound condition, may D/C ordered dressing product/s and apply Normal Saline Moist Dressing daily until next [place] / Other MD appointment. [place] of regression in wound condition at [phone]. Please direct any NON-WOUND related issues/requests for orders to patient's Primary Care Physician After sharp debridement with a curette he has some granulation tissue but most of the slough has still a little way to go. I will use silver alginate for another week and we will continue local care. He will come back and see [place] next week. Electronic Signature(s) Signed: 08/30/2014 12:47:20 PM By: Christin Fudge MD, FACS Entered By: Christin Fudge on 08/30/2014 10:06:34 Philip [name] ([id number]) -------------------------------------------------------------------------------- SuperBill Details Patient Name: JANTZ, MAIN. Date of Service: 08/30/2014 Medical Record Number: [id number] Patient Account Number: [id number] Date of Birth/Sex: 07/10/1918 (81 y.o. Male) Treating RN: Primary Care Physician: Hortencia Pilar Other Clinician: Referring Physician: Hortencia Pilar Treating  Physician/Extender: Frann Rider in Treatment: 17 Diagnosis Coding ICD-10 Codes Code Description [phone] 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 I82.401 Acute embolism and thrombosis of unspecified deep veins of right lower extremity S91.301A Unspecified open wound, right foot, initial encounter Z92.21 Personal history of antineoplastic chemotherapy Facility Procedures The patient participates with Medicare or their insurance follows the Medicare Facility Guidelines: CPT4 Description Modifier Quantity Code [id number] 11042 - DEB SUBQ TISSUE 20 SQ CM/< 1 ICD-10 Description Diagnosis I70.232 Atherosclerosis of native  arteries of right  leg with ulceration of calf I82.401 Acute embolism and thrombosis of unspecified deep veins of right lower extremity S91.301A Unspecified open wound, right foot, initial encounter Z92.21 Personal history of antineoplastic chemotherapy Physician Procedures CPT4: Description Modifier Quantity Code [id number] 20947 - WC PHYS SUBQ TISS 22 SQ CM 1 ICD-10 Description Diagnosis 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

## 2014-09-06 ENCOUNTER — Encounter: Payer: Medicare Other | Admitting: Surgery

## 2014-09-06 DIAGNOSIS — S91301A Unspecified open wound, right foot, initial encounter: Secondary | ICD-10-CM | POA: Diagnosis not present

## 2014-09-06 NOTE — Progress Notes (Addendum)
SHUAIB, CORSINO ([id number]) Visit Report for 09/06/2014 Chief Complaint Document Details Patient Name: Philip [name], Philip [name]. Date of Service: 09/06/2014 8:45 AM Medical Record Number: [id number] Patient Account Number: [id number] Date of Birth/Sex: 1919/03/14 (81 y.o. Male) Treating RN: Primary Care Physician: Hortencia Pilar Other Clinician: Referring Physician: Hortencia Pilar Treating Physician/Extender: Frann Rider in Treatment: 18 Information Obtained from: Patient Chief Complaint R foot ulcer. L forearm ulcer. 07/19/2014 -- about 2 weeks ago he had a surgical procedure done by dermatologist in [place] and has an open surgical wound on the dorsum of the right foot. Electronic Signature(s) Signed: 09/06/2014 12:33:17 PM By: Christin Fudge MD, FACS Entered By: Christin Fudge on 09/06/2014 09:24:15 Philip [name] ([id number]) -------------------------------------------------------------------------------- Debridement Details Patient Name: Philip [name], Philip [name]. Date of Service: 09/06/2014 8:45 AM Medical Record Number: [id number] Patient Account Number: [id number] Date of Birth/Sex: 1918/10/19 (81 y.o. Male) Treating RN: Primary Care Physician: Hortencia Pilar Other Clinician: Referring Physician: Hortencia Pilar Treating Physician/Extender: Frann Rider in Treatment: 18 Debridement Performed for Wound #3 Right,Dorsal Foot Assessment: Performed By: Physician Pat Patrick., MD Debridement: Debridement Pre-procedure Yes Verification/Time Out Taken: Start Time: 09:18 Pain Control: Lidocaine 5% topical ointment Level: Skin/Subcutaneous Tissue Total Area Debrided (L x 1.2 (cm) x 1.2 (cm) = 1.44 (cm) W): Tissue and other Viable, Non-Viable, Exudate, Fibrin/Slough, Subcutaneous material debrided: Instrument: Curette Bleeding: Minimum Hemostasis Achieved: Pressure End Time: 09:22 Procedural Pain: 0 Post Procedural Pain: 0 Response to Treatment: Procedure was tolerated  well Post Debridement Measurements of Total Wound Length: (cm) 1.2 Width: (cm) 1.2 Depth: (cm) 0.3 Volume: (cm) 0.339 Electronic Signature(s) Signed: 09/06/2014 12:33:17 PM By: Christin Fudge MD, FACS Entered By: Christin Fudge on 09/06/2014 09:24:05 Philip [name] ([id number]) -------------------------------------------------------------------------------- HPI Details Patient Name: Philip [name]. Date of Service: 09/06/2014 8:45 AM Medical Record Number: [id number] Patient Account Number: [id number] Date of Birth/Sex: [birth year] (81 y.o. Male) Treating RN: Primary Care Physician: Hortencia Pilar Other Clinician: Referring Physician: Hortencia Pilar Treating Physician/Extender: Frann Rider in Treatment: 18 History of Present Illness Location: right leg Duration: Dec 2015 Modifying Factors: history of an injury to the right leg with resulting hematoma and thrombophlebitis and later an ulcer of posterior leg Associated Signs and Symptoms: marked lymphedema of the right leg. He is already on Eloquis. HPI Description: 06/14/14 -- He returns for followup today. He denies any fevers. no fresh issues and his daughter says he's been doing fine. 06/21/14 -- after he sustained a fall this week earlier he applied a bandage over this himself and did not seek any medical attention. he did however manage to control the bleeding and had a dressing in place the next morning when his son to the visit. In this dressing was removed there was further damaged skin. His right leg has been doing fine otherwise. 07/12/14 --Very pleasant 81 year old with past medical history significant for congestive heart failure (EF 15%), peripheral vascular disease, and chronic kidney disease. He was hospitalized at [facility] in December 2015 for congestive heart failure. He says that he fell during his hospital course and developed a hematoma over his right calf. He was also diagnosed with a right lower extremity DVT  for which he takes Eliquis. The hematoma subsequently turned into an ulceration around Christmas, which has healed. He subsequently developed an ulcer on his right dorsal foot and a traumatic left forearm ulcer. Per his report, he underwent biopsy of the right dorsal foot ulceration which demonstrated a skin cancer. His PCP and dermatologist office are both  closed today. I reviewed his records in [place] but find no report of biopsy or pathology. He s without complaints today. No significant pain. No fever or chills. Minimal drainage. 07/19/2014 - the patient and his son tell me that about 2 weeks ago the dermatologist did a skin biopsy and this was a large area on the dorsum of his right foot which was left open and no dressing instructions were recommended. Since then he has been called and told that it is a cancer and the need to do a further procedure but that will not happen until about 2 weeks from now. In the meanwhile the patient has not been taking care of his right foot. The left forearm where he had an abrasion and laceration is doing very well. 07/26/2014 -- Reports from 07/08/2014 from the dermatology group reviewed. A excision was done of a lesion located on the dorsum of the right foot and this was 1.7 cm in diameter which was a shave biopsy performed. The wound was left open after appropriate cauterization and the patient was given this dressing instructions. 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 08/02/2014 -- all the original problems he came with have completely resolved. He now has a surgical wound on his right foot dorsum where a skin cancer was excised. He goes to see his dermatologist this Philip [name], Philip [name] ([id number]) coming Tuesday and will have definite news next Friday. 08/09/2014 he had gone to his dermatologist on Tuesday and she has injected the base of his ulcer with some  chemotherapeutic agent. He was supposed to bring some papers with him but forgot to get them and will bring them in next week. Other than that the dermatologist had suggested using Mehdi honey on the wound. 08/16/2014 -- the patient has brought in his notes from the dermatologist and on 08/06/2014 he received a injection of 5 FU, 500 mg grams into the lesion. the pathology report was also sent and it was a squamous cell carcinoma well-differentiated and edges were involved. They wanted him to use many honey for the wound dressing changes to be done 3 times a week. 08/30/2014 -- he has finished his second injection of 5-FU and has the next one in 2 weeks' time. He is doing fine otherwise. Electronic Signature(s) Signed: 09/06/2014 12:33:17 PM By: Christin Fudge MD, FACS Entered By: Christin Fudge on 09/06/2014 09:24:25 Philip [name] ([id number]) -------------------------------------------------------------------------------- Physical Exam Details Patient Name: Philip [name], Philip [name]. Date of Service: 09/06/2014 8:45 AM Medical Record Number: [id number] Patient Account Number: [id number] Date of Birth/Sex: 12-09-1918 (81 y.o. Male) Treating RN: Primary Care Physician: Hortencia Pilar Other Clinician: Referring Physician: Hortencia Pilar Treating Physician/Extender: Frann Rider in Treatment: 18 Constitutional . Pulse regular. Respirations normal and unlabored. Afebrile. . Eyes Nonicteric. Reactive to light. Ears, Nose, Mouth, and Throat Lips, teeth, and gums WNL.Marland Kitchen Moist mucosa without lesions . Neck supple and nontender. No palpable supraclavicular or cervical adenopathy. Normal sized without goiter. Respiratory WNL. No retractions.. Cardiovascular Pedal Pulses WNL. No clubbing, cyanosis or edema. Integumentary (Hair, Skin) there is minimum maceration on around his ulcer on the right dorsum of the foot but the base of the ulcer is fairly clean with minimal slough and debris.. No  crepitus or fluctuance. No peri-wound warmth or erythema. No masses.Marland Kitchen Psychiatric Judgement and insight Intact.. No evidence of depression, anxiety, or agitation.. Electronic Signature(s) Signed: 09/06/2014 12:33:17 PM By: Christin Fudge MD, FACS Entered  By: Christin Fudge on 09/06/2014 09:25:45 Philip [name] ([id number]) -------------------------------------------------------------------------------- Physician Orders Details Patient Name: Philip [name], Philip [name]. Date of Service: 09/06/2014 8:45 AM Medical Record Number: [id number] Patient Account Number: [id number] Date of Birth/Sex: [birth year] (81 y.o. Male) Treating RN: Cornell Barman Primary Care Physician: Hortencia Pilar Other Clinician: Referring Physician: Hortencia Pilar Treating Physician/Extender: Frann Rider in Treatment: 57 Verbal / Phone Orders: Yes Clinician: Cornell Barman Read Back and Verified: Yes Diagnosis Coding Wound Cleansing Wound #3 Right,Dorsal Foot o Clean wound with Normal Saline. o May Shower, gently pat wound dry prior to applying new dressing. o May shower with protection. - DO NOT GET DRESSING WET. Anesthetic Wound #3 Right,Dorsal Foot o Topical Lidocaine 4% cream applied to wound bed prior to debridement Skin Barriers/Peri-Wound Care Wound #3 Right,Dorsal Foot o Barrier cream Primary Wound Dressing Wound #3 Right,Dorsal Foot o Prisma Ag Secondary Dressing Wound #3 Right,Dorsal Foot o Boardered Foam Dressing Dressing Change Frequency Wound #3 Right,Dorsal Foot o Change dressing every other day. Follow-up Appointments Wound #3 Right,Dorsal Foot o Return Appointment in 1 week. Edema Control Wound #3 Right,Dorsal Foot o Tubigrip KRAIG, GENIS ([id number]) Home Health Wound #3 [place] Visits - [place] Nurse may visit PRN to address patientos wound care needs. o FACE TO FACE ENCOUNTER: MEDICARE and MEDICAID PATIENTS: I  certify that this patient is under my care and that I had a face-to-face encounter that meets the physician face-to-face encounter requirements with this patient on this date. The encounter with the patient was in whole or in part for the following MEDICAL CONDITION: (primary reason for [place]) MEDICAL NECESSITY: I certify, that based on my findings, NURSING services are a medically necessary home health service. HOME BOUND STATUS: I certify that my clinical findings support that this patient is homebound (i.e., Due to illness or injury, pt requires aid of supportive devices such as crutches, cane, wheelchairs, walkers, the use of special transportation or the assistance of another person to leave their place of residence. There is a normal inability to leave the home and doing so requires considerable and taxing effort. Other absences are for medical reasons / religious services and are infrequent or of short duration when for other reasons). o If current dressing causes regression in wound condition, may D/C ordered dressing product/s and apply Normal Saline Moist Dressing daily until next [place] / Other MD appointment. [place] of regression in wound condition at [phone]. o Please direct any NON-WOUND related issues/requests for orders to patient's Primary Care Physician Electronic Signature(s) Signed: 09/06/2014 12:33:17 PM By: Christin Fudge MD, FACS Signed: 09/06/2014 3:25:33 PM By: Gretta Cool RN, BSN, Kim RN, BSN Entered By: Gretta Cool, RN, BSN, Kim on 09/06/2014 09:21:30 Philip [name] ([id number]) -------------------------------------------------------------------------------- Problem List Details Patient Name: Philip [name], Philip [name]. Date of Service: 09/06/2014 8:45 AM Medical Record Number: [id number] Patient Account Number: [id number] Date of Birth/Sex: 06/21/1918 (81 y.o. Male) Treating RN: Primary Care Physician: Hortencia Pilar Other  Clinician: Referring Physician: Hortencia Pilar Treating Physician/Extender: Frann Rider in Treatment: 16 Active Problems ICD-10 Encounter Code Description Active Date Diagnosis I70.232 Atherosclerosis of native arteries of right leg with 06/21/2014 Yes ulceration of calf I82.401 Acute embolism and thrombosis of unspecified deep veins 06/21/2014 Yes of right lower extremity S91.301A Unspecified open wound, right foot, initial encounter 07/19/2014 Yes Z92.21 Personal history of antineoplastic chemotherapy 08/23/2014 Yes Inactive Problems Resolved Problems ICD-10 Code Description Active Date Resolved Date L97.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 Date of Service: 09/06/2014 8:45 AM Medical Record Number: [id number] Patient Account Number: [id number] Date of Birth/Sex: 1918/12/24 (81 y.o. Male) Treating RN: Primary Care Physician: Hortencia Pilar Other Clinician: Referring Physician: Hortencia Pilar Treating Physician/Extender: Frann Rider in Treatment: 59 Subjective Chief Complaint Information obtained from Patient R foot ulcer. L forearm ulcer. 07/19/2014 -- about 2 weeks ago he had a surgical procedure done by dermatologist in [place] and has an open surgical wound on the dorsum of the right foot. History of Present Illness (HPI) The following HPI elements were documented for the patient's wound: Location: right leg Duration: Dec 2015 Modifying Factors: history of an injury to the right leg with resulting  hematoma and thrombophlebitis and later an ulcer of posterior leg Associated Signs and Symptoms: marked lymphedema of the right leg. He is already on Eloquis. 06/14/14 -- He returns for followup today. He denies any fevers. no fresh issues and his daughter says he's been doing fine. 06/21/14 -- after he sustained a fall this week earlier he applied a bandage over this himself and did not seek any medical attention. he did however manage to control the bleeding and had a dressing in place the next morning when his son to the visit. In this dressing was removed there was further damaged skin. His right leg has been doing fine otherwise. 07/12/14 --Very pleasant 81 year old with past medical history significant for congestive heart failure (EF 15%), peripheral vascular disease, and chronic kidney disease. He was hospitalized at [facility] in December 2015 for congestive heart failure. He says that he fell during his hospital course and developed a hematoma over his right calf. He was also diagnosed with a right lower extremity DVT for which he takes Eliquis. The hematoma subsequently turned into an ulceration around Christmas, which has healed. He subsequently developed an ulcer on his right dorsal foot and a traumatic left forearm ulcer. Per his report, he underwent biopsy of the right dorsal foot ulceration which demonstrated a skin cancer. His PCP and dermatologist office are both closed today. I reviewed his records in [place] but find no report of biopsy or pathology. He s without complaints today. No significant pain. No fever or chills. Minimal drainage. 07/19/2014 - the patient and his son tell me that about 2 weeks ago the dermatologist did a skin biopsy and this was a large area on the dorsum of his right foot which was left open and no dressing instructions were recommended. Since then he has been called and told that it is a cancer and the need to do a further procedure but that will not happen  until about 2 weeks from now. In the meanwhile the patient has not been taking care of his right foot. The left forearm where he had an abrasion and laceration is doing very well. Philip [name], Philip [name] ([id number]) 07/26/2014 -- Reports from 07/08/2014 from the dermatology group reviewed. A excision was done of a lesion located on the dorsum of the right foot and this was 1.7 cm in diameter which was a shave biopsy performed. The wound was left open after appropriate cauterization and the patient was given this dressing instructions. 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 08/02/2014 -- all the original problems he came with have completely resolved. He now has a surgical wound on his right foot dorsum where a skin cancer was excised. He goes to see his dermatologist this coming Tuesday and will have definite news next Friday. 08/09/2014 he had gone to his dermatologist on Tuesday and she has injected the base of his ulcer with some chemotherapeutic agent. He was supposed to bring some papers with him but forgot to get them and will bring them in next week. Other than that the dermatologist had suggested using Mehdi honey on the wound. 08/16/2014 -- the patient has brought in his notes from the dermatologist and on 08/06/2014 he received a injection of 5 FU, 500 mg grams into the lesion. the pathology report was also sent and it was a squamous cell carcinoma well-differentiated and edges were involved. They wanted him to use many honey for the wound dressing changes to be done 3 times a week. 08/30/2014 -- he has finished his second injection of 5-FU and has the next one in 2 weeks' time. He is doing fine otherwise. Objective Constitutional Pulse regular. Respirations normal and unlabored. Afebrile. Vitals Time Taken: 9:01 AM, Height: 69 in, Weight: 179 lbs, BMI: 26.4, Temperature: 98.2 F, Pulse:  75 bpm, Respiratory Rate: 18 breaths/min, Blood Pressure: 144/73 mmHg. Eyes Nonicteric. Reactive to light. Ears, Nose, Mouth, and Throat Lips, teeth, and gums WNL.Marland Kitchen Moist mucosa without lesions . Neck supple and nontender. No palpable supraclavicular or cervical adenopathy. Normal sized without goiter. Respiratory WNL. No retractions.Philip [name] ([id number]) Cardiovascular Pedal Pulses WNL. No clubbing, cyanosis or edema. Psychiatric Judgement and insight Intact.. No evidence of depression, anxiety, or agitation.. Integumentary (Hair, Skin) there is minimum maceration on around his ulcer on the right dorsum of the foot but the base of the ulcer is fairly clean with minimal slough and debris.. No crepitus or fluctuance. No peri-wound warmth or erythema. No masses.. Wound #3 status is Open. Original cause of wound was Surgical Injury. The wound is located on the Right,Dorsal Foot. The wound measures 1.2cm length x 1.2cm width x 0.3cm depth; 1.131cm^2 area and 0.339cm^3 volume. The wound is limited to skin breakdown. There is no tunneling or undermining noted. There is a small amount of serosanguineous drainage noted. The wound margin is distinct with the outline attached to the wound base. There is medium (34-66%) red granulation within the wound bed. There is a medium (34-66%) amount of necrotic tissue within the wound bed including Adherent Slough. The periwound skin appearance exhibited: Maceration, Moist. The periwound skin appearance did not exhibit: Callus, Crepitus, Excoriation, Fluctuance, Friable, Induration, Localized Edema, Rash, Scarring, Dry/Scaly, Atrophie Blanche, Cyanosis, Ecchymosis, Hemosiderin Staining, Mottled, Pallor, Rubor, Erythema. Periwound temperature was noted as No Abnormality. 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 I82.401 - Acute embolism and thrombosis of unspecified deep  veins of right lower extremity S91.301A - Unspecified open wound, right foot, initial encounter Z92.21 - Personal history of antineoplastic chemotherapy The wound is coming along nicely and I will change over from silver alginate to silver collagen dressing and a light compression. He is going to see his dermatologist for the next injection of 5-FU this coming week and will see [place] on Friday Philip [name], Philip [name]. ([id number]) Procedures Wound #3 Wound #3 is a Malignant Wound located on the Right,Dorsal Foot .  There was a Skin/Subcutaneous Tissue Debridement ([id number]) debridement with total area of 1.44 sq cm performed by Philip [name], Philip [name]., MD. with the following instrument(s): Curette to remove Viable and Non-Viable tissue/material including Exudate, Fibrin/Slough, and Subcutaneous after achieving pain control using Lidocaine 5% topical ointment. A time out was conducted prior to the start of the procedure. A Minimum amount of bleeding was controlled with Pressure. The procedure was tolerated well with a pain level of 0 throughout and a pain level of 0 following the procedure. Post Debridement Measurements: 1.2cm length x 1.2cm width x 0.3cm depth; 0.339cm^3 volume. Plan Wound Cleansing: Wound #3 Right,Dorsal Foot: Clean wound with Normal Saline. May Shower, gently pat wound dry prior to applying new dressing. May shower with protection. - DO NOT GET DRESSING WET. Anesthetic: Wound #3 Right,Dorsal Foot: Topical Lidocaine 4% cream applied to wound bed prior to debridement Skin Barriers/Peri-Wound Care: Wound #3 Right,Dorsal Foot: Barrier cream Primary Wound Dressing: Wound #3 Right,Dorsal Foot: Prisma Ag Secondary Dressing: Wound #3 Right,Dorsal Foot: Boardered Foam Dressing Dressing Change Frequency: Wound #3 Right,Dorsal Foot: Change dressing every other day. Follow-up Appointments: Wound #3 Right,Dorsal Foot: Return Appointment in 1 week. Edema Control: Wound #3 Right,Dorsal  Foot: Tubigrip Home Health: Wound #3 Right,Dorsal Foot: [place] Visits - [place] Nurse may visit PRN to address patient s wound care needs. FACE TO FACE ENCOUNTER: MEDICARE and MEDICAID PATIENTS: I certify that this patient is under Philip [name], Philip [name] ([id number]) my care and that I had a face-to-face encounter that meets the physician face-to-face encounter requirements with this patient on this date. The encounter with the patient was in whole or in part for the following MEDICAL CONDITION: (primary reason for [place]) MEDICAL NECESSITY: I certify, that based on my findings, NURSING services are a medically necessary home health service. HOME BOUND STATUS: I certify that my clinical findings support that this patient is homebound (i.e., Due to illness or injury, pt requires aid of supportive devices such as crutches, cane, wheelchairs, walkers, the use of special transportation or the assistance of another person to leave their place of residence. There is a normal inability to leave the home and doing so requires considerable and taxing effort. Other absences are for medical reasons / religious services and are infrequent or of short duration when for other reasons). If current dressing causes regression in wound condition, may D/C ordered dressing product/s and apply Normal Saline Moist Dressing daily until next [place] / Other MD appointment. [place] of regression in wound condition at [phone]. Please direct any NON-WOUND related issues/requests for orders to patient's Primary Care Physician The wound is coming along nicely and I will change over from silver alginate to silver collagen dressing and a light compression. He is going to see his dermatologist for the next injection of 5-FU this coming week and will see [place] on Friday Electronic Signature(s) Signed: 09/09/2014 1:03:07 PM By: Christin Fudge MD, FACS Previous  Signature: 09/06/2014 12:33:17 PM Version By: Christin Fudge MD, FACS Entered By: Christin Fudge on 09/09/2014 12:57:53 Philip [name] ([id number]) -------------------------------------------------------------------------------- [place] Details Patient Name: Philip [name], NISHI. Date of Service: 09/06/2014 Medical Record Number: [id number] Patient Account Number: [id number] Date of Birth/Sex: 1918-05-27 (81 y.o. Male) Treating RN: Primary Care Physician: Hortencia Pilar Other Clinician: Referring Physician: Hortencia Pilar Treating Physician/Extender: Frann Rider in Treatment: 18 Diagnosis Coding ICD-10 Codes Code Description [phone] 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 I82.401 Acute embolism  and thrombosis of unspecified deep veins of right lower extremity S91.301A Unspecified open wound, right foot, initial encounter Z92.21 Personal history of antineoplastic chemotherapy Facility Procedures The patient participates with Medicare or their insurance follows the Medicare Facility Guidelines: CPT4 Code Description Modifier Quantity [id number] 11042 - DEB SUBQ TISSUE 20 SQ CM/< 1 ICD-10 Description Diagnosis I70.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

## 2014-09-06 NOTE — Progress Notes (Signed)
ISSIAC, JAMAR ([id number]) Visit Report for 09/06/2014 Arrival Information Details Patient Name: Philip [name], Philip [name]. Date of Service: 09/06/2014 8:45 AM Medical Record Number: [id number] Patient Account Number: [id number] Date of Birth/Sex: [birth year] (81 y.o. Male) Treating RN: Montey Hora Primary Care Physician: Hortencia Pilar Other Clinician: Referring Physician: Hortencia Pilar Treating Physician/Extender: Frann Rider in Treatment: 18 Visit Information History Since Last Visit Added or deleted any medications: No Patient Arrived: Ambulatory Any new allergies or adverse reactions: No Arrival Time: 09:03 Had a fall or experienced change in No Accompanied By: cg activities of daily living that may affect Transfer Assistance: None risk of falls: Patient Identification Verified: Yes Signs or symptoms of abuse/neglect since last No Secondary Verification Process Yes visito Completed: Hospitalized since last visit: No Patient Requires Transmission- No Pain Present Now: No Based Precautions: Patient Has Alerts: Yes Patient Alerts: Patient on Blood Thinner Electronic Signature(s) Signed: 09/06/2014 3:22:42 PM By: Montey Hora Entered By: Montey Hora on 09/06/2014 09:03:59 Philip [name] ([id number]) -------------------------------------------------------------------------------- Encounter Discharge Information Details Patient Name: Philip [name], Philip [name]. Date of Service: 09/06/2014 8:45 AM Medical Record Number: [id number] Patient Account Number: [id number] Date of Birth/Sex: 1918-06-30 (81 y.o. Male) Treating RN: Primary Care Physician: Hortencia Pilar Other Clinician: Referring Physician: Hortencia Pilar Treating Physician/Extender: Frann Rider in Treatment: 65 Encounter Discharge Information Items Discharge Pain Level: 0 Discharge Condition: Stable Ambulatory Status: Cane Discharge Destination: Home Transportation: Private Auto Accompanied By:  self Schedule Follow-up Appointment: Yes Medication Reconciliation completed No and provided to Patient/Care Tameron Lama: Provided on Clinical Summary of Care: 09/06/2014 Form Type Recipient Paper Patient GT Electronic Signature(s) Signed: 09/06/2014 3:22:42 PM By: Montey Hora Previous Signature: 09/06/2014 9:32:59 AM Version By: Ruthine Dose Entered By: Montey Hora on 09/06/2014 09:39:08 Philip [name] ([id number]) -------------------------------------------------------------------------------- Lower Extremity Assessment Details Patient Name: Philip [name], Philip [name]. Date of Service: 09/06/2014 8:45 AM Medical Record Number: [id number] Patient Account Number: [id number] Date of Birth/Sex: Feb 07, 1919 (81 y.o. Male) Treating RN: Montey Hora Primary Care Physician: Hortencia Pilar Other Clinician: Referring Physician: Hortencia Pilar Treating Physician/Extender: Frann Rider in Treatment: 60 Vascular Assessment Pulses: Posterior Tibial Palpable: [Right:Yes] Dorsalis Pedis Palpable: [Right:Yes] Extremity colors, hair growth, and conditions: Extremity Color: [Right:Hyperpigmented] Hair Growth on Extremity: [Right:No] Temperature of Extremity: [Right:Warm] Capillary Refill: [Right:< 3 seconds] Toe Nail Assessment Left: Right: Thick: Yes Discolored: No Deformed: No Improper Length and Hygiene: No Electronic Signature(s) Signed: 09/06/2014 3:22:42 PM By: Montey Hora Entered By: Montey Hora on 09/06/2014 09:11:41 Philip [name] ([id number]) -------------------------------------------------------------------------------- Multi Wound Chart Details Patient Name: Philip [name]. Date of Service: 09/06/2014 8:45 AM Medical Record Number: [id number] Patient Account Number: [id number] Date of Birth/Sex: [birth year] (81 y.o. 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] [N/A:N/A] Comorbid History: [3:Cataracts, Arrhythmia, Congestive Heart Failure] [N/A:N/A] Date Acquired: [3:07/01/2014] [N/A:N/A] Weeks of Treatment: [3:8] [N/A:N/A] Wound Status: [3:Open] [N/A:N/A] Measurements L x W x D 1.2x1.2x0.3 [N/A:N/A] (cm) Area (cm) : [3:1.131] [N/A:N/A] Volume (cm) : [3:0.339] [N/A:N/A] % Reduction in Area: [3:40.00%] [N/A:N/A] % Reduction in Volume: 55.00% [N/A:N/A] Classification: [3:Full Thickness Without Exposed Support Structures] [N/A:N/A] Exudate Amount: [3:Small] [N/A:N/A] Exudate Type: [3:Serosanguineous] [N/A:N/A] Exudate Color: [3:red, brown] [N/A:N/A] Wound Margin: [  3:Distinct, outline attached] [N/A:N/A] Granulation Amount: [3:Medium (34-66%)] [N/A:N/A] Granulation Quality: [3:Red] [N/A:N/A] Necrotic Amount: [3:Medium (34-66%)] [N/A:N/A] Exposed Structures: [3:Fascia: No Fat: No Tendon: No Muscle: No Joint: No] [N/A:N/A] Bone: No Limited to Skin Breakdown Epithelialization: Small (1-33%) N/A N/A Periwound Skin Texture: Edema: No N/A N/A Excoriation: No Induration: No Callus: No Crepitus: No Fluctuance: No Friable: No Rash: No Scarring: No Periwound Skin Maceration: Yes N/A N/A Moisture: Moist: Yes Dry/Scaly: No Periwound Skin Color: Atrophie Blanche: No N/A N/A Cyanosis: No Ecchymosis: No Erythema: No Hemosiderin Staining: No Mottled: No Pallor: No Rubor: No Temperature: No Abnormality N/A N/A Tenderness on No N/A N/A Palpation: Wound Preparation: Ulcer Cleansing: N/A N/A Rinsed/Irrigated with Saline Topical Anesthetic Applied: Other:  lidocaine 4% Treatment Notes Electronic Signature(s) Signed: 09/06/2014 3:25:33 PM By: Gretta Cool, RN, BSN, Kim RN, BSN Entered By: Gretta Cool, RN, BSN, Kim on 09/06/2014 09:16:25 Philip [name] ([id number]) -------------------------------------------------------------------------------- [place] Details Patient Name: Philip [name], Philip [name]. Date of Service: 09/06/2014 8:45 AM Medical Record Number: [id number] Patient Account Number: [id number] Date of Birth/Sex: 17-Nov-1918 (81 y.o. Male) Treating RN: Cornell Barman Primary Care Physician: Hortencia Pilar Other Clinician: Referring Physician: Hortencia Pilar Treating Physician/Extender: Frann Rider in Treatment: 64 Active Inactive Abuse / Safety / Falls / Self Care Management Nursing Diagnoses: Abuse or neglect; actual or potential Potential for falls Goals: Patient will remain injury free Date Initiated: 05/02/2014 Goal Status: Active Interventions: Assess fall risk on admission and as needed Notes: Necrotic Tissue Nursing Diagnoses: Impaired tissue integrity related to necrotic/devitalized tissue Goals: Necrotic/devitalized tissue will be minimized in the wound bed Date Initiated: 05/02/2014 Goal Status: Active Interventions: Assess patient pain level pre-, during and post procedure and prior to discharge Treatment Activities: Apply topical anesthetic as ordered : 09/06/2014 Notes: Orientation to the Wound Care Program Nursing Diagnoses: Knowledge deficit related to the wound healing center program Philip [name], Philip [name] ([id number]) Goals: Patient/caregiver will verbalize understanding of the [place] Program Date Initiated: 05/02/2014 Goal Status: Active Interventions: Provide education on orientation to the wound center Notes: Electronic Signature(s) Signed: 09/06/2014 3:25:33 PM By: Gretta Cool, RN, BSN, Kim RN, BSN Entered By: Gretta Cool, RN, BSN, Kim on 09/06/2014 09:16:17 Philip [name]  ([id number]) -------------------------------------------------------------------------------- Patient/Caregiver Education Details Patient Name: Philip [name], Philip [name]. Date of Service: 09/06/2014 8:45 AM Medical Record Number: [id number] Patient Account Number: [id number] Date of Birth/Gender: [birth year] (81 y.o. Male) Treating RN: Cornell Barman Primary Care Physician: Hortencia Pilar Other Clinician: Referring Physician: Hortencia Pilar Treating Physician/Extender: Frann Rider in Treatment: 39 Education Assessment Education Provided To: Patient Education Topics Provided Venous: Controlling Swelling with Compression Stockings , Other: Wear tubigrib from early morning to Handouts: bedtime Methods: Demonstration, Explain/Verbal Responses: State content correctly Electronic Signature(s) Signed: 09/06/2014 3:25:33 PM By: Gretta Cool, RN, BSN, Kim RN, BSN Entered By: Gretta Cool, RN, BSN, Kim on 09/06/2014 09:23:36 Philip [name] ([id number]) -------------------------------------------------------------------------------- Wound Assessment Details Patient Name: Philip [name], Philip [name]. Date of Service: 09/06/2014 8:45 AM Medical Record Number: [id number] Patient Account Number: [id number] Date of Birth/Sex: 06/22/1918 (81 y.o. Male) Treating RN: Montey Hora Primary Care Physician: Hortencia Pilar Other Clinician: Referring Physician: Hortencia Pilar Treating Physician/Extender: Frann Rider in Treatment: 18 Wound Status Wound Number: 3 Primary Malignant Wound Etiology: Wound Location: Right Foot - Dorsal Wound Status: Open Wounding Event: Surgical Injury Comorbid Cataracts, Arrhythmia, Congestive Date Acquired: 07/01/2014 History: Heart Failure Weeks Of Treatment: 8 Clustered Wound: No Photos Wound Measurements Length: (cm) 1.2 Width: (cm) 1.2 Depth: (cm) 0.3 Area: (cm) 1.131 Volume: (cm) 0.339 %  Reduction in Area: 40% % Reduction in Volume: 55% Epithelialization: Small  (1-33%) Tunneling: No Undermining: No Wound Description Full Thickness Without Exposed Classification: Support Structures Wound Margin: Distinct, outline attached Exudate Small Amount: Exudate Type: Serosanguineous Exudate Color: red, brown Foul Odor After Cleansing: No Wound Bed Granulation Amount: Medium (34-66%) Exposed Structure Granulation Quality: Red Fascia Exposed: No Necrotic Amount: Medium (34-66%) Fat Layer Exposed: No Philip [name], Philip [name] ([id number]) Necrotic Quality: Adherent Slough Tendon Exposed: No Muscle Exposed: No Joint Exposed: No Bone Exposed: No Limited to Skin Breakdown Periwound Skin Texture Texture Color No Abnormalities Noted: No No Abnormalities Noted: No Callus: No Atrophie Blanche: No Crepitus: No Cyanosis: No Excoriation: No Ecchymosis: No Fluctuance: No Erythema: No Friable: No Hemosiderin Staining: No Induration: No Mottled: No Localized Edema: No Pallor: No Rash: No Rubor: No Scarring: No Temperature / Pain Moisture Temperature: No Abnormality No Abnormalities Noted: No Dry / Scaly: No Maceration: Yes Moist: Yes Wound Preparation Ulcer Cleansing: Rinsed/Irrigated with Saline Topical Anesthetic Applied: Other: lidocaine 4%, Treatment Notes Wound #3 (Right, Dorsal Foot) 1. Cleansed with: Clean wound with Normal Saline 2. Anesthetic Topical Lidocaine 4% cream to wound bed prior to debridement 3. Peri-wound Care: Barrier cream Skin Prep 4. Dressing Applied: Prisma Ag 5. Secondary Dressing Applied Bordered Foam Dressing Electronic Signature(s) Signed: 09/06/2014 1:12:38 PM By: Montey Hora Entered By: Montey Hora on 09/06/2014 13:12:38 Philip [name], Philip [name] ([id number], Philip [name] ([id number]) -------------------------------------------------------------------------------- [place] Details Patient Name: Philip [name], Philip [name]. Date of Service: 09/06/2014 8:45 AM Medical Record Number: [id number] Patient Account Number:  [id number] Date of Birth/Sex: 04-Mar-1919 (81 y.o. Male) Treating RN: Montey Hora Primary Care Physician: Hortencia Pilar Other Clinician: Referring Physician: Hortencia Pilar Treating Physician/Extender: Frann Rider in Treatment: 18 Vital Signs Time Taken: 09:01 Temperature (F): 98.2 Height (in): 69 Pulse (bpm): 75 Weight (lbs): 179 Respiratory Rate (breaths/min): 18 Body Mass Index (BMI): 26.4 Blood Pressure (mmHg): 144/73 Reference Range: 80 - 120 mg / dl Electronic Signature(s) Signed: 09/06/2014 3:22:42 PM By: Montey Hora Entered By: Montey Hora on 09/06/2014 09:04:53

## 2014-09-13 ENCOUNTER — Encounter: Payer: Medicare Other | Admitting: Surgery

## 2014-09-13 DIAGNOSIS — S91301A Unspecified open wound, right foot, initial encounter: Secondary | ICD-10-CM | POA: Diagnosis not present

## 2014-09-17 NOTE — Progress Notes (Signed)
CORRELL, DENBOW ([id number]) Visit Report for 09/13/2014 Chief Complaint Document Details Patient Name: Philip [name], Philip [name]. Date of Service: 09/13/2014 9:30 AM Medical Record Number: [id number] Patient Account Number: [id number] Date of Birth/Sex: Aug 21, 1918 (81 y.o. Male) Treating RN: Primary Care Physician: Hortencia Pilar Other Clinician: Referring Physician: Hortencia Pilar Treating Physician/Extender: Frann Rider in Treatment: 3 Information Obtained from: Patient Chief Complaint R foot ulcer. L forearm ulcer. 07/19/2014 -- about 2 weeks ago he had a surgical procedure done by dermatologist in [place] and has an open surgical wound on the dorsum of the right foot. Electronic Signature(s) Signed: 09/13/2014 2:14:37 PM By: Loletha Grayer MD Signed: 09/17/2014 7:58:07 AM By: Christin Fudge MD, FACS Entered By: Loletha Grayer on 09/13/2014 10:29:06 [name] ([id number]) -------------------------------------------------------------------------------- Debridement Details Patient Name: Philip, [name]. Date of Service: 09/13/2014 9:30 AM Medical Record Number: [id number] Patient Account Number: [id number] Date of Birth/Sex: 08/23/1918 (81 y.o. Male) Treating RN: Primary Care Physician: Hortencia Pilar Other Clinician: Referring Physician: Hortencia Pilar Treating Physician/Extender: Frann Rider in Treatment: 19 Debridement Performed for Wound #3 Right,Dorsal Foot Assessment: Performed By: Physician Pat Patrick., MD Debridement: Debridement Pre-procedure No Verification/Time Out Taken: Start Time: 10:00 Pain Control: Other : lidocaine 4% Level: Skin/Subcutaneous Tissue Total Area Debrided (L x 1 (cm) x 1.1 (cm) = 1.1 (cm) W): Tissue and other Viable, Non-Viable, Exudate, Fat, Fibrin/Slough, Subcutaneous material debrided: Instrument: Curette Bleeding: Minimum Hemostasis Achieved: Pressure End Time: 10:05 Procedural Pain: 0 Post Procedural Pain:  0 Response to Treatment: Procedure was tolerated well Post Debridement Measurements of Total Wound Length: (cm) 1 Width: (cm) 1.1 Depth: (cm) 0.4 Volume: (cm) 0.346 Electronic Signature(s) Signed: 09/13/2014 2:14:37 PM By: Loletha Grayer MD Signed: 09/17/2014 7:58:07 AM By: Christin Fudge MD, FACS Entered By: Loletha Grayer on 09/13/2014 10:28:57 [name] ([id number]) -------------------------------------------------------------------------------- HPI Details Patient Name: Philip, [name]. Date of Service: 09/13/2014 9:30 AM Medical Record Number: [id number] Patient Account Number: [id number] Date of Birth/Sex: Dec 08, 1918 (81 y.o. Male) Treating RN: Primary Care Physician: Hortencia Pilar Other Clinician: Referring Physician: Hortencia Pilar Treating Physician/Extender: Frann Rider in Treatment: 4 History of Present Illness Location: right leg Duration: Dec 2015 Modifying Factors: history of an injury to the right leg with resulting hematoma and thrombophlebitis and later an ulcer of posterior leg Associated Signs and Symptoms: marked lymphedema of the right leg. He is already on Eloquis. HPI Description: 06/14/14 -- He returns for followup today. He denies any fevers. no fresh issues and his daughter says he's been doing fine. 06/21/14 -- after he sustained a fall this week earlier he applied a bandage over this himself and did not seek any medical attention. he did however manage to control the bleeding and had a dressing in place the next morning when his son to the visit. In this dressing was removed there was further damaged skin. His right leg has been doing fine otherwise. 07/12/14 --Very pleasant 81 year old with past medical history significant for congestive heart failure (EF 15%), peripheral vascular disease, and chronic kidney disease. He was hospitalized at [facility] in December 2015 for congestive heart failure. He says that he fell during his hospital  course and developed a hematoma over his right calf. He was also diagnosed with a right lower extremity DVT for which he takes Eliquis. The hematoma subsequently turned into an ulceration around Christmas, which has healed. He subsequently developed an ulcer on his right dorsal foot and a traumatic left forearm ulcer. Per his report,  he underwent biopsy of the right dorsal foot ulceration which demonstrated a skin cancer. His PCP and dermatologist office are both closed today. I reviewed his records in [place] but find no report of biopsy or pathology. He s without complaints today. No significant pain. No fever or chills. Minimal drainage. 07/19/2014 - the patient and his son tell me that about 2 weeks ago the dermatologist did a skin biopsy and this was a large area on the dorsum of his right foot which was left open and no dressing instructions were recommended. Since then he has been called and told that it is a cancer and the need to do a further procedure but that will not happen until about 2 weeks from now. In the meanwhile the patient has not been taking care of his right foot. The left forearm where he had an abrasion and laceration is doing very well. 07/26/2014 -- Reports from 07/08/2014 from the dermatology group reviewed. A excision was done of a lesion located on the dorsum of the right foot and this was 1.7 cm in diameter which was a shave biopsy performed. The wound was left open after appropriate cauterization and the patient was given this dressing instructions. 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 08/02/2014 -- all the original problems he came with have completely resolved. He now has a surgical wound on his right foot dorsum where a skin cancer was excised. He goes to see his dermatologist this Philip, [name] ([id number]) coming Tuesday and will have definite news next  Friday. 08/09/2014 he had gone to his dermatologist on Tuesday and she has injected the base of his ulcer with some chemotherapeutic agent. He was supposed to bring some papers with him but forgot to get them and will bring them in next week. Other than that the dermatologist had suggested using Mehdi honey on the wound. 08/16/2014 -- the patient has brought in his notes from the dermatologist and on 08/06/2014 he received a injection of 5 FU, 500 mg grams into the lesion. the pathology report was also sent and it was a squamous cell carcinoma well-differentiated and edges were involved. They wanted him to use many honey for the wound dressing changes to be done 3 times a week. 08/30/2014 -- he has finished his second injection of 5-FU and has the next one in 2 weeks' time. He is doing fine otherwise. 09/13/2014 - No new complaints. No significant pain. No fever or chills. Minimal drainage. Still receiving 5-FU injections. Electronic Signature(s) Signed: 09/13/2014 2:14:37 PM By: Loletha Grayer MD Signed: 09/17/2014 7:58:07 AM By: Christin Fudge MD, FACS Entered By: Loletha Grayer on 09/13/2014 10:30:09 [name] ([id number]) -------------------------------------------------------------------------------- Physical Exam Details Patient Name: RONALDO, CRILLY. Date of Service: 09/13/2014 9:30 AM Medical Record Number: [id number] Patient Account Number: [id number] Date of Birth/Sex: February 04, 1919 (81 y.o. Male) Treating RN: Primary Care Physician: Hortencia Pilar Other Clinician: Referring Physician: Hortencia Pilar Treating Physician/Extender: Frann Rider in Treatment: 19 Constitutional . Pulse regular. Respirations normal and unlabored. Afebrile. Marland Kitchen Respiratory WNL. No retractions.. Cardiovascular . Integumentary (Hair, Skin) .Marland Kitchen Neurological Sensation normal to touch, pin,and vibration. Psychiatric Judgement and insight Intact.. Oriented times 3.. No evidence of  depression, anxiety, or agitation.. Notes Right dorsal foot ulceration. Full-thickness. No exposed deep structures. No evidence for infection. Minimal edema. Faintly palpable PT. Electronic Signature(s) Signed: 09/13/2014 2:14:37 PM By: Loletha Grayer MD Signed: 09/17/2014 7:58:07  AM By: Christin Fudge MD, FACS Entered By: Loletha Grayer on 09/13/2014 10:31:16 [name] ([id number]) -------------------------------------------------------------------------------- Physician Orders Details Patient Name: JAMORRIS, NDIAYE. Date of Service: 09/13/2014 9:30 AM Medical Record Number: [id number] Patient Account Number: [id number] Date of Birth/Sex: 1919-01-21 (81 y.o. Male) Treating RN: Cornell Barman Primary Care Physician: Hortencia Pilar Other Clinician: Referring Physician: Hortencia Pilar Treating Physician/Extender: Frann Rider in Treatment: 40 Verbal / Phone Orders: Yes Clinician: Cornell Barman Read Back and Verified: Yes Diagnosis Coding Wound Cleansing Wound #3 Right,Dorsal Foot o Clean wound with Normal Saline. o May Shower, gently pat wound dry prior to applying new dressing. o May shower with protection. - DO NOT GET DRESSING WET. Anesthetic Wound #3 Right,Dorsal Foot o Topical Lidocaine 4% cream applied to wound bed prior to debridement Skin Barriers/Peri-Wound Care Wound #3 Right,Dorsal Foot o Barrier cream Primary Wound Dressing Wound #3 Right,Dorsal Foot o Prisma Ag Secondary Dressing Wound #3 Right,Dorsal Foot o Boardered Foam Dressing Dressing Change Frequency Wound #3 Right,Dorsal Foot o Change dressing every other day. Follow-up Appointments Wound #3 Right,Dorsal Foot o Return Appointment in 1 week. Edema Control Wound #3 Right,Dorsal Foot o Tubigrip YEHYA, BRENDLE ([id number]) Home Health Wound #3 [place] Visits - [place] Nurse may visit PRN to address patientos wound care  needs. o FACE TO FACE ENCOUNTER: MEDICARE and MEDICAID PATIENTS: I certify that this patient is under my care and that I had a face-to-face encounter that meets the physician face-to-face encounter requirements with this patient on this date. The encounter with the patient was in whole or in part for the following MEDICAL CONDITION: (primary reason for [place]) MEDICAL NECESSITY: I certify, that based on my findings, NURSING services are a medically necessary home health service. HOME BOUND STATUS: I certify that my clinical findings support that this patient is homebound (i.e., Due to illness or injury, pt requires aid of supportive devices such as crutches, cane, wheelchairs, walkers, the use of special transportation or the assistance of another person to leave their place of residence. There is a normal inability to leave the home and doing so requires considerable and taxing effort. Other absences are for medical reasons / religious services and are infrequent or of short duration when for other reasons). o If current dressing causes regression in wound condition, may D/C ordered dressing product/s and apply Normal Saline Moist Dressing daily until next [place] / Other MD appointment. [place]ner of regression in wound condition at [phone]. o Please direct any NON-WOUND related issues/requests for orders to patient's Primary Care Physician Electronic Signature(s) Signed: 09/13/2014 2:10:21 PM By: Gretta Cool RN, BSN, Kim RN, BSN Signed: 09/17/2014 7:58:07 AM By: Christin Fudge MD, FACS Entered By: Gretta Cool RN, BSN, Kim on 09/13/2014 10:05:59 [name] ([id number]) -------------------------------------------------------------------------------- Problem List Details Patient Name: NURI, LARMER. Date of Service: 09/13/2014 9:30 AM Medical Record Number: [id number] Patient Account Number: [id number] Date of Birth/Sex: 1918/07/24 (81 y.o.  Male) Treating RN: Primary Care Physician: Hortencia Pilar Other Clinician: Referring Physician: Hortencia Pilar Treating Physician/Extender: Frann Rider in Treatment: 60 Active Problems ICD-10 Encounter Code Description Active Date Diagnosis I70.232 Atherosclerosis of native arteries of right leg with 06/21/2014 Yes ulceration of calf I82.401 Acute embolism and thrombosis of unspecified deep veins 06/21/2014 Yes of right lower extremity S91.301A Unspecified open wound, right foot, initial encounter 07/19/2014 Yes Z92.21 Personal history of antineoplastic chemotherapy 08/23/2014 Yes Inactive Problems Resolved Problems ICD-10 Code  Description Active Date Resolved Date L97.212 Non-pressure chronic ulcer of right calf with fat layer 06/21/2014 06/21/2014 exposed S51.812A Laceration without foreign body of left forearm, initial 06/21/2014 06/21/2014 encounter Electronic Signature(s) Signed: 09/13/2014 2:14:37 PM By: Loletha Grayer MD Signed: 09/17/2014 7:58:07 AM By: Christin Fudge MD, FACS Entered By: Loletha Grayer on 09/13/2014 10:28:40 [name] ([id number]) XYLON, CROOM ([id number]) -------------------------------------------------------------------------------- Progress Note Details Patient Name: BRALLAN, DENIO. Date of Service: 09/13/2014 9:30 AM Medical Record Number: [id number] Patient Account Number: [id number] Date of Birth/Sex: January 07, 1919 (81 y.o. Male) Treating RN: Primary Care Physician: Hortencia Pilar Other Clinician: Referring Physician: Hortencia Pilar Treating Physician/Extender: Frann Rider in Treatment: 61 Subjective Chief Complaint Information obtained from Patient R foot ulcer. L forearm ulcer. 07/19/2014 -- about 2 weeks ago he had a surgical procedure done by dermatologist in [place] and has an open surgical wound on the dorsum of the right foot. History of Present Illness (HPI) The following HPI elements were documented for the patient's  wound: Location: right leg Duration: Dec 2015 Modifying Factors: history of an injury to the right leg with resulting hematoma and thrombophlebitis and later an ulcer of posterior leg Associated Signs and Symptoms: marked lymphedema of the right leg. He is already on Eloquis. 06/14/14 -- He returns for followup today. He denies any fevers. no fresh issues and his daughter says he's been doing fine. 06/21/14 -- after he sustained a fall this week earlier he applied a bandage over this himself and did not seek any medical attention. he did however manage to control the bleeding and had a dressing in place the next morning when his son to the visit. In this dressing was removed there was further damaged skin. His right leg has been doing fine otherwise. 07/12/14 --Very pleasant 81 year old with past medical history significant for congestive heart failure (EF 15%), peripheral vascular disease, and chronic kidney disease. He was hospitalized at [facility] in December 2015 for congestive heart failure. He says that he fell during his hospital course and developed a hematoma over his right calf. He was also diagnosed with a right lower extremity DVT for which he takes Eliquis. The hematoma subsequently turned into an ulceration around Christmas, which has healed. He subsequently developed an ulcer on his right dorsal foot and a traumatic left forearm ulcer. Per his report, he underwent biopsy of the right dorsal foot ulceration which demonstrated a skin cancer. His PCP and dermatologist office are both closed today. I reviewed his records in [place] but find no report of biopsy or pathology. He s without complaints today. No significant pain. No fever or chills. Minimal drainage. 07/19/2014 - the patient and his son tell me that about 2 weeks ago the dermatologist did a skin biopsy and this was a large area on the dorsum of his right foot which was left open and no dressing instructions were recommended. Since  then he has been called and told that it is a cancer and the need to do a further procedure but that will not happen until about 2 weeks from now. In the meanwhile the patient has not been taking care of his right foot. The left forearm where he had an abrasion and laceration is doing very well. ANCIL, DEWAN ([id number]) 07/26/2014 -- Reports from 07/08/2014 from the dermatology group reviewed. A excision was done of a lesion located on the dorsum of the right foot and this was 1.7 cm in diameter which was a shave biopsy performed. The  wound was left open after appropriate cauterization and the patient was given this dressing instructions. 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 08/02/2014 -- all the original problems he came with have completely resolved. He now has a surgical wound on his right foot dorsum where a skin cancer was excised. He goes to see his dermatologist this coming Tuesday and will have definite news next Friday. 08/09/2014 he had gone to his dermatologist on Tuesday and she has injected the base of his ulcer with some chemotherapeutic agent. He was supposed to bring some papers with him but forgot to get them and will bring them in next week. Other than that the dermatologist had suggested using Mehdi honey on the wound. 08/16/2014 -- the patient has brought in his notes from the dermatologist and on 08/06/2014 he received a injection of 5 FU, 500 mg grams into the lesion. the pathology report was also sent and it was a squamous cell carcinoma well-differentiated and edges were involved. They wanted him to use many honey for the wound dressing changes to be done 3 times a week. 08/30/2014 -- he has finished his second injection of 5-FU and has the next one in 2 weeks' time. He is doing fine otherwise. 09/13/2014 - No new complaints. No significant pain. No fever or chills.  Minimal drainage. Still receiving 5-FU injections. Objective Constitutional Pulse regular. Respirations normal and unlabored. Afebrile. Vitals Time Taken: 9:48 AM, Height: 69 in, Weight: 179 lbs, BMI: 26.4, Temperature: 97.7 F, Pulse: 66 bpm, Respiratory Rate: 18 breaths/min, Blood Pressure: 119/81 mmHg. Respiratory WNL. No retractions.. Neurological Sensation normal to touch, pin,and vibration. Psychiatric Judgement and insight Intact.. Oriented times 3.. No evidence of depression, anxiety, or agitation.JOSEMANUEL, EAKINS ([id number]) General Notes: Right dorsal foot ulceration. Full-thickness. No exposed deep structures. No evidence for infection. Minimal edema. Faintly palpable PT. Integumentary (Hair, Skin) Wound #3 status is Open. Original cause of wound was Surgical Injury. The wound is located on the Right,Dorsal Foot. The wound measures 1cm length x 1.1cm width x 0.3cm depth; 0.864cm^2 area and 0.259cm^3 volume. The wound is limited to skin breakdown. There is no tunneling or undermining noted. There is a small amount of serous drainage noted. The wound margin is distinct with the outline attached to the wound base. There is medium (34-66%) red granulation within the wound bed. There is a medium (34- 66%) amount of necrotic tissue within the wound bed including Adherent Slough. The periwound skin appearance exhibited: Localized Edema, Moist, Erythema. The periwound skin appearance did not exhibit: Callus, Crepitus, Excoriation, Fluctuance, Friable, Induration, Rash, Scarring, Dry/Scaly, Maceration, Atrophie Blanche, Cyanosis, Ecchymosis, Hemosiderin Staining, Mottled, Pallor, Rubor. The surrounding wound skin color is noted with erythema which is circumferential. Erythema is measured at 3 cm. Periwound temperature was noted as No Abnormality. 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 I82.401 - Acute  embolism and thrombosis of unspecified deep veins of right lower extremity S91.301A - Unspecified open wound, right foot, initial encounter Z92.21 - Personal history of antineoplastic chemotherapy Right dorsal foot ulceration, status post excision of squamous cell carcinoma. 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 There was a Skin/Subcutaneous Tissue Debridement ([id number]) debridement with total area of 1.1 sq cm performed by Britto, Jackson Latino., MD. with the following instrument(s): Curette to remove Viable and Non-Viable tissue/material including Exudate,  Fat, Fibrin/Slough, and Subcutaneous after achieving pain control using Other (lidocaine 4%). A time out was not conducted prior to the start of the procedure. A Minimum amount of bleeding was controlled with Pressure. The procedure was tolerated well with a pain level of 0 throughout and a pain level of 0 following the procedure. Post Debridement Measurements: 1cm length x 1.1cm width x 0.4cm depth; 0.346cm^3 OSRIC, KLOPF. ([id number]) volume. Plan Wound Cleansing: Wound #3 Right,Dorsal Foot: Clean wound with Normal Saline. May Shower, gently pat wound dry prior to applying new dressing. May shower with protection. - DO NOT GET DRESSING WET. Anesthetic: Wound #3 Right,Dorsal Foot: Topical Lidocaine 4% cream applied to wound bed prior to debridement Skin Barriers/Peri-Wound Care: Wound #3 Right,Dorsal Foot: Barrier cream Primary Wound Dressing: Wound #3 Right,Dorsal Foot: Prisma Ag Secondary Dressing: Wound #3 Right,Dorsal Foot: Boardered Foam Dressing Dressing Change Frequency: Wound #3 Right,Dorsal Foot: Change dressing every other day. Follow-up Appointments: Wound #3 Right,Dorsal Foot: Return Appointment in 1 week. Edema Control: Wound #3 Right,Dorsal Foot: Tubigrip Home Health: Wound #3 Right,Dorsal Foot: [place] Visits - [place] Nurse may  visit PRN to address patient s wound care needs. FACE TO FACE ENCOUNTER: MEDICARE and MEDICAID PATIENTS: I certify that this patient is under my care and that I had a face-to-face encounter that meets the physician face-to-face encounter requirements with this patient on this date. The encounter with the patient was in whole or in part for the following MEDICAL CONDITION: (primary reason for [place]) MEDICAL NECESSITY: I certify, that based on my findings, NURSING services are a medically necessary home health service. HOME BOUND STATUS: I certify that my clinical findings support that this patient is homebound (i.e., Due to illness or injury, pt requires aid of supportive devices such as crutches, cane, wheelchairs, walkers, the use of special transportation or the assistance of another person to leave their place of residence. There is a normal inability to leave the home and doing so requires considerable and taxing effort. Other absences are for medical reasons / religious services and are infrequent or of short duration when for other reasons). If current dressing causes regression in wound condition, may D/C ordered dressing product/s and apply Normal Saline Moist Dressing daily until next [place] / Other MD appointment. Notify Wound MARTY, UY ([id number]) [place] of regression in wound condition at [phone]. Please direct any NON-WOUND related issues/requests for orders to patient's Primary Care Physician Continue with Promogran Prisma dressing changes. Tubigrip for edema control. Consider skin substitute once he has completed 5-FU injections. Electronic Signature(s) Signed: 09/13/2014 2:14:37 PM By: Loletha Grayer MD Signed: 09/17/2014 7:58:07 AM By: Christin Fudge MD, FACS Entered By: Loletha Grayer on 09/13/2014 10:32:51 [name] ([id number]) -------------------------------------------------------------------------------- SuperBill  Details Patient Name: ESAIAH, WANLESS. Date of Service: 09/13/2014 Medical Record Number: [id number] Patient Account Number: [id number] Date of Birth/Sex: Aug 25, 1918 (81 y.o. Male) Treating RN: Primary Care Physician: Hortencia Pilar Other Clinician: Referring Physician: Hortencia Pilar Treating Physician/Extender: BURNS, Charlean Sanfilippo in Treatment: 19 Diagnosis Coding ICD-10 Codes Code Description I70.232 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 I82.401 Acute embolism and thrombosis of unspecified deep veins of right lower extremity S91.301A Unspecified open wound, right foot, initial encounter Z92.21 Personal history of antineoplastic chemotherapy Facility Procedures The patient participates with Medicare or their insurance follows the Medicare Facility Guidelines: CPT4 Code Description Modifier Quantity [id number] 11042 - DEB SUBQ TISSUE 20 SQ CM/< 1 ICD-10  Description Diagnosis S91.301A Unspecified open wound, right  foot, initial encounter Physician Procedures CPT4 Code: [id number] Description: WC PHYS LEVEL 3 o NEW PT ICD-10 Description Diagnosis S91.301A Unspecified open wound, right foot, initial encounter Modifier: Quantity: 1 CPT4 Code: [id number] Description: [place] - WC PHYS SUBQ TISS 20 SQ CM ICD-10 Description Diagnosis S91.301A Unspecified open wound, right foot, initial encounter Modifier: Quantity: 1 Electronic Signature(s) Signed: 09/13/2014 2:14:37 PM By: Loletha Grayer MD Entered By: Loletha Grayer on 09/13/2014 10:33:14

## 2014-09-17 NOTE — Progress Notes (Signed)
Philip, [name] ([id number]) Visit Report for 09/13/2014 Arrival Information Details Patient Name: Philip [name], Philip [name]. Date of Service: 09/13/2014 9:30 AM Medical Record Number: [id number] Patient Account Number: [id number] Date of Birth/Sex: 02/19/1919 (81 y.o. Male) Treating RN: Cornell Barman Primary Care Physician: Hortencia Pilar Other Clinician: Referring Physician: Hortencia Pilar Treating Physician/Extender: Frann Rider in Treatment: 55 Visit Information History Since Last Visit Added or deleted any medications: No Patient Arrived: Ambulatory Any new allergies or adverse reactions: No Arrival Time: 09:48 Had a fall or experienced change in No Accompanied By: self activities of daily living that may affect Transfer Assistance: None risk of falls: Patient Identification Verified: Yes Signs or symptoms of abuse/neglect since last No Secondary Verification Process Yes visito Completed: Hospitalized since last visit: No Patient Requires Transmission- No Has Dressing in Place as Prescribed: Yes Based Precautions: Pain Present Now: No Patient Has Alerts: Yes Patient Alerts: Patient on Blood Thinner Electronic Signature(s) Signed: 09/13/2014 2:10:21 PM By: Gretta Cool, RN, BSN, Kim RN, BSN Entered By: Gretta Cool, RN, BSN, Kim on 09/13/2014 09:48:50 Philip [name] ([id number]) -------------------------------------------------------------------------------- Encounter Discharge Information Details Patient Name: Philip [name], Philip [name]. Date of Service: 09/13/2014 9:30 AM Medical Record Number: [id number] Patient Account Number: [id number] Date of Birth/Sex: Jul 07, 1918 (81 y.o. Male) Treating RN: Primary Care Physician: Hortencia Pilar Other Clinician: Referring Physician: Hortencia Pilar Treating Physician/Extender: Frann Rider in Treatment: 46 Encounter Discharge Information Items Discharge Pain Level: 0 Discharge Condition: Stable Ambulatory Status: Cane Discharge Destination:  Home Transportation: Private Auto Accompanied By: caregiver Schedule Follow-up Appointment: Yes Medication Reconciliation completed Yes and provided to Patient/Care Philip [name]: Provided on Clinical Summary of Care: 09/13/2014 Form Type Recipient Paper Patient GT Electronic Signature(s) Signed: 09/13/2014 2:10:21 PM By: Gretta Cool RN, BSN, Kim RN, BSN Previous Signature: 09/13/2014 10:12:00 AM Version By: Ruthine Dose Entered By: Gretta Cool RN, BSN, Kim on 09/13/2014 10:18:43 Philip [name] ([id number]) -------------------------------------------------------------------------------- Lower Extremity Assessment Details Patient Name: Philip [name], Philip [name]. Date of Service: 09/13/2014 9:30 AM Medical Record Number: [id number] Patient Account Number: [id number] Date of Birth/Sex: 1918-09-08 (81 y.o. Male) Treating RN: Cornell Barman Primary Care Physician: Hortencia Pilar Other Clinician: Referring Physician: Hortencia Pilar Treating Physician/Extender: Frann Rider in Treatment: 44 Vascular Assessment Pulses: Posterior Tibial Dorsalis Pedis Palpable: [Right:Yes] Extremity colors, hair growth, and conditions: Extremity Color: [Right:Normal] Hair Growth on Extremity: [Right:No] Temperature of Extremity: [Right:Warm] Capillary Refill: [Right:< 3 seconds] Blanched when Elevated: [Right:No] Lipodermatosclerosis: [Right:No] Toe Nail Assessment Left: Right: Thick: Yes Discolored: Yes Deformed: No Improper Length and Hygiene: No Electronic Signature(s) Signed: 09/13/2014 2:10:21 PM By: Gretta Cool, RN, BSN, Kim RN, BSN Entered By: Gretta Cool, RN, BSN, Kim on 09/13/2014 09:53:30 Philip [name] ([id number]) -------------------------------------------------------------------------------- Multi Wound Chart Details Patient Name: Philip [name], Philip [name]. Date of Service: 09/13/2014 9:30 AM Medical Record Number: [id number] Patient Account Number: [id number] Date of Birth/Sex: 10/27/1918 (81 y.o. 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] [N/A:N/A] Comorbid History: [3:Cataracts, Arrhythmia, Congestive Heart Failure] [N/A:N/A] Date Acquired: [3:07/01/2014] [N/A:N/A] Weeks of Treatment: [3:9] [N/A:N/A] Wound Status: [3:Open] [N/A:N/A] Measurements L x W x D 1x1.1x0.3 [N/A:N/A] (cm) Area (cm) : [3:0.864] [N/A:N/A] Volume (cm) : [3:0.259] [N/A:N/A] % Reduction in Area: [3:54.20%] [N/A:N/A] %  Reduction in Volume: 65.60% [N/A:N/A] Classification: [3:Full Thickness Without Exposed Support Structures] [N/A:N/A] Exudate Amount: [3:Small] [N/A:N/A] Exudate Type: [3:Serous] [N/A:N/A] Exudate Color: [3:amber] [N/A:N/A] Wound Margin: [3:Distinct, outline attached] [N/A:N/A] Granulation Amount: [3:Medium (34-66%)] [N/A:N/A] Granulation Quality: [3:Red] [N/A:N/A] Necrotic Amount: [3:Medium (34-66%)] [N/A:N/A] Exposed Structures: [3:Fascia: No Fat: No Tendon: No Muscle: No Joint: No] [N/A:N/A] Bone: No Limited to Skin Breakdown Epithelialization: Small (1-33%) N/A N/A Periwound Skin Texture: Edema: Yes N/A N/A Excoriation: No Induration: No Callus: No Crepitus: No Fluctuance: No Friable: No Rash: No Scarring: No Periwound Skin Moist: Yes N/A N/A Moisture: Maceration: No Dry/Scaly: No Periwound Skin Color: Erythema: Yes N/A N/A Atrophie Blanche: No Cyanosis: No Ecchymosis: No Hemosiderin Staining: No Mottled: No Pallor: No Rubor: No Erythema Location: Circumferential N/A N/A Erythema Measurement:  Measured: 3cm N/A N/A Temperature: No Abnormality N/A N/A Tenderness on No N/A N/A Palpation: Wound Preparation: Ulcer Cleansing: N/A N/A Rinsed/Irrigated with Saline Topical Anesthetic Applied: Other: lidocaine 4% Treatment Notes Electronic Signature(s) Signed: 09/13/2014 2:10:21 PM By: Gretta Cool, RN, BSN, Kim RN, BSN Entered By: Gretta Cool, RN, BSN, Kim on 09/13/2014 09:58:03 Philip [name] ([id number]) -------------------------------------------------------------------------------- [place] Details Patient Name: Philip [name], Philip [name]. Date of Service: 09/13/2014 9:30 AM Medical Record Number: [id number] Patient Account Number: [id number] Date of Birth/Sex: Mar 18, 1919 (81 y.o. Male) Treating RN: Cornell Barman Primary Care Physician: Hortencia Pilar Other Clinician: Referring Physician: Hortencia Pilar Treating Physician/Extender: Frann Rider in Treatment: 60 Active Inactive Abuse / Safety / Falls / Self Care Management Nursing Diagnoses: Abuse or neglect; actual or potential Potential for falls Goals: Patient will remain injury free Date Initiated: 05/02/2014 Goal Status: Active Interventions: Assess fall risk on admission and as needed Notes: Necrotic Tissue Nursing Diagnoses: Impaired tissue integrity related to necrotic/devitalized tissue Goals: Necrotic/devitalized tissue will be minimized in the wound bed Date Initiated: 05/02/2014 Goal Status: Active Interventions: Assess patient pain level pre-, during and post procedure and prior to discharge Treatment Activities: Apply topical anesthetic as ordered : 09/13/2014 Notes: Orientation to the Wound Care Program Nursing Diagnoses: Knowledge deficit related to the wound healing center program Philip [name], Philip [name] ([id number]) Goals: Patient/caregiver will verbalize understanding of the [facility] Program Date Initiated: 05/02/2014 Goal Status: Active Interventions: Provide education on orientation  to the wound center Notes: Electronic Signature(s) Signed: 09/13/2014 2:10:21 PM By: Gretta Cool, RN, BSN, Kim RN, BSN Entered By: Gretta Cool, RN, BSN, Kim on 09/13/2014 09:57:56 Philip [name] ([id number]) -------------------------------------------------------------------------------- Pain Assessment Details Patient Name: Philip [name], Philip [name]. Date of Service: 09/13/2014 9:30 AM Medical Record Number: [id number] Patient Account Number: [id number] Date of Birth/Sex: [birth year] (81 y.o. Male) Treating RN: Cornell Barman Primary Care Physician: Hortencia Pilar Other Clinician: Referring Physician: Hortencia Pilar Treating Physician/Extender: Frann Rider in Treatment: 33 Active Problems Location of Pain Severity and Description of Pain Patient Has Paino No Site Locations Pain Management and Medication Current Pain Management: Electronic Signature(s) Signed: 09/13/2014 2:10:21 PM By: Gretta Cool, RN, BSN, Kim RN, BSN Entered By: Gretta Cool, RN, BSN, Kim on 09/13/2014 09:48:55 Philip [name] ([id number]) -------------------------------------------------------------------------------- Patient/Caregiver Education Details Patient Name: Philip [name], Philip [name]. Date of Service: 09/13/2014 9:30 AM Medical Record Number: [id number] Patient Account Number: [id number] Date of Birth/Gender: 08-10-1918 (81 y.o. Male) Treating RN: Cornell Barman Primary Care Physician: Hortencia Pilar Other Clinician: Referring Physician: Hortencia Pilar Treating Physician/Extender: Frann Rider in Treatment: 7 Education Assessment Education Provided To: Patient Education Topics Provided Wound/Skin Impairment: Handouts: Caring for Your Ulcer, Skin Care Do's and Dont's, Other: Do not get dressings wet. Methods: Demonstration  Responses: State content correctly Electronic Signature(s) Signed: 09/13/2014 2:10:21 PM By: Gretta Cool, RN, BSN, Kim RN, BSN Entered By: Gretta Cool, RN, BSN, Kim on 09/13/2014 10:19:21 Philip [name]  ([id number]) -------------------------------------------------------------------------------- Wound Assessment Details Patient Name: Philip [name], Philip [name]. Date of Service: 09/13/2014 9:30 AM Medical Record Number: [id number] Patient Account Number: [id number] Date of Birth/Sex: [birth year] (81 y.o. Male) Treating RN: Cornell Barman Primary Care Physician: Hortencia Pilar Other Clinician: Referring Physician: Hortencia Pilar Treating Physician/Extender: Frann Rider in Treatment: 19 Wound Status Wound Number: 3 Primary Malignant Wound Etiology: Wound Location: Right Foot - Dorsal Wound Status: Open Wounding Event: Surgical Injury Comorbid Cataracts, Arrhythmia, Congestive Date Acquired: 07/01/2014 History: Heart Failure Weeks Of Treatment: 9 Clustered Wound: No Photos Photo Uploaded By: Montey Hora on 09/13/2014 16:04:29 Wound Measurements Length: (cm) 1 Width: (cm) 1.1 Depth: (cm) 0.3 Area: (cm) 0.864 Volume: (cm) 0.259 % Reduction in Area: 54.2% % Reduction in Volume: 65.6% Epithelialization: Small (1-33%) Tunneling: No Undermining: No Wound Description Full Thickness Without Exposed Foul Odor Afte Classification: Support Structures Wound Margin: Distinct, outline attached Exudate Small Amount: Exudate Type: Serous Exudate Color: amber r Cleansing: No Wound Bed Granulation Amount: Medium (34-66%) Exposed Structure Granulation Quality: Red Fascia Exposed: No Necrotic Amount: Medium (34-66%) Fat Layer Exposed: No Philip [name], Philip [name] ([id number]) Necrotic Quality: Adherent Slough Tendon Exposed: No Muscle Exposed: No Joint Exposed: No Bone Exposed: No Limited to Skin Breakdown Periwound Skin Texture Texture Color No Abnormalities Noted: No No Abnormalities Noted: No Callus: No Atrophie Blanche: No Crepitus: No Cyanosis: No Excoriation: No Ecchymosis: No Fluctuance: No Erythema: Yes Friable: No Erythema Location: Circumferential Induration:  No Erythema Measurement: Measured Localized Edema: Yes 3 cm Rash: No Hemosiderin Staining: No Scarring: No Mottled: No Pallor: No Moisture Rubor: No No Abnormalities Noted: No Dry / Scaly: No Temperature / Pain Maceration: No Temperature: No Abnormality Moist: Yes Wound Preparation Ulcer Cleansing: Rinsed/Irrigated with Saline Topical Anesthetic Applied: Other: lidocaine 4%, Treatment Notes Wound #3 (Right, Dorsal Foot) 1. Cleansed with: Clean wound with Normal Saline 2. Anesthetic Topical Lidocaine 4% cream to wound bed prior to debridement 3. Peri-wound Care: Barrier cream 4. Dressing Applied: Prisma Ag 5. Secondary Dressing Applied Bordered Foam Dressing 7. Secured with Financial risk analyst) Signed: 09/13/2014 2:10:21 PM By: Gretta Cool, RN, BSN, Kim RN, BSN Entered By: Gretta Cool, RN, BSN, Kim on 09/13/2014 09:55:28 Philip [name], Philip [name] ([id number], Philip [name] ([id number]) -------------------------------------------------------------------------------- [place] Details Patient Name: [place], [place]. Date of Service: 09/13/2014 9:30 AM Medical Record Number: [id number] Patient Account Number: [id number] Date of Birth/Sex: 12/13/1918 (81 y.o. Male) Treating RN: Cornell Barman Primary Care Physician: Hortencia Pilar Other Clinician: Referring Physician: Hortencia Pilar Treating Physician/Extender: Frann Rider in Treatment: 56 Vital Signs Time Taken: 09:48 Temperature (F): 97.7 Height (in): 69 Pulse (bpm): 66 Weight (lbs): 179 Respiratory Rate (breaths/min): 18 Body Mass Index (BMI): 26.4 Blood Pressure (mmHg): 119/81 Reference Range: 80 - 120 mg / dl Electronic Signature(s) Signed: 09/13/2014 2:10:21 PM By: Gretta Cool, RN, BSN, Kim RN, BSN Entered By: Gretta Cool, RN, BSN, Kim on 09/13/2014 09:49:12

## 2014-09-20 ENCOUNTER — Encounter: Payer: Medicare Other | Attending: Surgery | Admitting: Surgery

## 2014-09-20 DIAGNOSIS — I739 Peripheral vascular disease, unspecified: Secondary | ICD-10-CM | POA: Diagnosis not present

## 2014-09-20 DIAGNOSIS — I82401 Acute embolism and thrombosis of unspecified deep veins of right lower extremity: Secondary | ICD-10-CM | POA: Insufficient documentation

## 2014-09-20 DIAGNOSIS — I509 Heart failure, unspecified: Secondary | ICD-10-CM | POA: Insufficient documentation

## 2014-09-20 DIAGNOSIS — I70232 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: Secondary | ICD-10-CM | POA: Diagnosis not present

## 2014-09-20 DIAGNOSIS — N189 Chronic kidney disease, unspecified: Secondary | ICD-10-CM | POA: Insufficient documentation

## 2014-09-20 DIAGNOSIS — Z9221 Personal history of antineoplastic chemotherapy: Secondary | ICD-10-CM | POA: Insufficient documentation

## 2014-09-20 DIAGNOSIS — X58XXXA Exposure to other specified factors, initial encounter: Secondary | ICD-10-CM | POA: Diagnosis not present

## 2014-09-20 DIAGNOSIS — C4492 Squamous cell carcinoma of skin, unspecified: Secondary | ICD-10-CM | POA: Insufficient documentation

## 2014-09-20 DIAGNOSIS — L97519 Non-pressure chronic ulcer of other part of right foot with unspecified severity: Secondary | ICD-10-CM | POA: Diagnosis not present

## 2014-09-20 DIAGNOSIS — L97522 Non-pressure chronic ulcer of other part of left foot with fat layer exposed: Secondary | ICD-10-CM | POA: Diagnosis not present

## 2014-09-20 DIAGNOSIS — S91301A Unspecified open wound, right foot, initial encounter: Secondary | ICD-10-CM | POA: Insufficient documentation

## 2014-09-20 NOTE — Progress Notes (Signed)
Philip [name] ([id number]) Visit Report for 09/20/2014 Chief Complaint Document Details Patient Name: Philip [name], Philip [name]. Date of Service: 09/20/2014 10:15 AM Medical Record Number: [id number] Patient Account Number: [id number] Date of Birth/Sex: 1918-05-27 (81 y.o. Male) Treating RN: Primary Care Physician: Hortencia Pilar Other Clinician: Referring Physician: Hortencia Pilar Treating Physician/Extender: Philip [name] in Treatment: 20 Information Obtained from: Patient Chief Complaint R foot ulcer. L forearm ulcer. 07/19/2014 -- about 2 weeks ago he had a surgical procedure done by dermatologist in [place] and has an open surgical wound on the dorsum of the right foot. Electronic Signature(s) Signed: 09/20/2014 12:18:49 PM By: Christin Fudge MD, FACS Entered By: Christin Fudge on 09/20/2014 11:35:31 Philip [name] ([id number]) -------------------------------------------------------------------------------- Debridement Details Patient Name: Philip [name]. Date of Service: 09/20/2014 10:15 AM Medical Record Number: [id number] Patient Account Number: [id number] Date of Birth/Sex: [birth year] (81 y.o. Male) Treating RN: Primary Care Physician: Hortencia Pilar Other Clinician: Referring Physician: Hortencia Pilar Treating Physician/Extender: Philip [name] in Treatment: 20 Debridement Performed for Wound #4 Left,Medial Malleolus Assessment: Performed By: Physician Pat Patrick., MD Debridement: Open Wound/Selective Debridement Selective Description: Pre-procedure Yes Verification/Time Out Taken: Start Time: 11:08 Pain Control: Other : lidocaine 4% Level: Skin/Epidermis Total Area Debrided (L x 0.4 (cm) x 0.5 (cm) = 0.2 (cm) W): Tissue and other Non-Viable, Eschar, Exudate, Fibrin/Slough material debrided: Instrument: Forceps Bleeding: None End Time: 11:13 Procedural Pain: 1 Post Procedural Pain: 1 Response to Treatment: Procedure was tolerated well Post Debridement  Measurements of Total Wound Length: (cm) 0.4 Width: (cm) 0.5 Depth: (cm) 0.1 Volume: (cm) 0.016 Electronic Signature(s) Signed: 09/20/2014 12:18:49 PM By: Christin Fudge MD, FACS Entered By: Christin Fudge on 09/20/2014 11:34:53 Philip [name] ([id number]) -------------------------------------------------------------------------------- Debridement Details Patient Name: Philip [name]. Date of Service: 09/20/2014 10:15 AM Medical Record Number: [id number] Patient Account Number: [id number] Date of Birth/Sex: [birth year] (81 y.o. Male) Treating RN: Primary Care Physician: Hortencia Pilar Other Clinician: Referring Physician: Hortencia Pilar Treating Physician/Extender: Philip [name] in Treatment: 20 Debridement Performed for Wound #3 Right,Dorsal Foot Assessment: Performed By: Physician Pat Patrick., MD Debridement: Open Wound/Selective Debridement Selective Description: Pre-procedure Yes Verification/Time Out Taken: Start Time: 11:08 Pain Control: Other : lidocaine 4% Level: Skin/Epidermis Total Area Debrided (L x 1.3 (cm) x 1.3 (cm) = 1.69 (cm) W): Tissue and other Non-Viable, Exudate, Fibrin/Slough material debrided: Instrument: Forceps, Other : gauze Bleeding: None End Time: 11:13 Procedural Pain: 1 Post Procedural Pain: 1 Response to Treatment: Procedure was tolerated well Post Debridement Measurements of Total Wound Length: (cm) 1.3 Width: (cm) 1.3 Depth: (cm) 0.3 Volume: (cm) 0.398 Electronic Signature(s) Signed: 09/20/2014 12:18:49 PM By: Christin Fudge MD, FACS Entered By: Christin Fudge on 09/20/2014 11:35:08 Philip [name] ([id number]) -------------------------------------------------------------------------------- HPI Details Patient Name: Philip [name]. Date of Service: 09/20/2014 10:15 AM Medical Record Number: [id number] Patient Account Number: [id number] Date of Birth/Sex: 1918-08-31 (81 y.o. Male) Treating RN: Primary Care Physician:  Hortencia Pilar Other Clinician: Referring Physician: Hortencia Pilar Treating Physician/Extender: Philip [name] in Treatment: 20 History of Present Illness Location: right leg Duration: Dec 2015 Modifying Factors: history of an injury to the right leg with resulting hematoma and thrombophlebitis and later an ulcer of posterior leg Associated Signs and Symptoms: marked lymphedema of the right leg. He is already on Eloquis. HPI Description: 06/14/14 -- He returns for followup today. He denies any fevers. no fresh issues and his daughter says he's been doing fine. 06/21/14 -- after he sustained a fall this week earlier he  applied a bandage over this himself and did not seek any medical attention. he did however manage to control the bleeding and had a dressing in place the next morning when his son to the visit. In this dressing was removed there was further damaged skin. His right leg has been doing fine otherwise. 07/12/14 --Very pleasant 81 year old with past medical history significant for congestive heart failure (EF 15%), peripheral vascular disease, and chronic kidney disease. He was hospitalized at [facility] in December 2015 for congestive heart failure. He says that he fell during his hospital course and developed a hematoma over his right calf. He was also diagnosed with a right lower extremity DVT for which he takes Eliquis. The hematoma subsequently turned into an ulceration around Christmas, which has healed. He subsequently developed an ulcer on his right dorsal foot and a traumatic left forearm ulcer. Per his report, he underwent biopsy of the right dorsal foot ulceration which demonstrated a skin cancer. His PCP and dermatologist office are both closed today. I reviewed his records in [place] but find no report of biopsy or pathology. He s without complaints today. No significant pain. No fever or chills. Minimal drainage. 07/19/2014 - the patient and his son tell me that about 2  weeks ago the dermatologist did a skin biopsy and this was a large area on the dorsum of his right foot which was left open and no dressing instructions were recommended. Since then he has been called and told that it is a cancer and the need to do a further procedure but that will not happen until about 2 weeks from now. In the meanwhile the patient has not been taking care of his right foot. The left forearm where he had an abrasion and laceration is doing very well. 07/26/2014 -- Reports from 07/08/2014 from the dermatology group reviewed. A excision was done of a lesion located on the dorsum of the right foot and this was 1.7 cm in diameter which was a shave biopsy performed. The wound was left open after appropriate cauterization and the patient was given this dressing instructions. 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 08/02/2014 -- all the original problems he came with have completely resolved. He now has a surgical wound on his right foot dorsum where a skin cancer was excised. He goes to see his dermatologist this Philip [name], Philip [name] ([id number]) coming Tuesday and will have definite news next Friday. 08/09/2014 he had gone to his dermatologist on Tuesday and she has injected the base of his ulcer with some chemotherapeutic agent. He was supposed to bring some papers with him but forgot to get them and will bring them in next week. Other than that the dermatologist had suggested using Mehdi honey on the wound. 08/16/2014 -- the patient has brought in his notes from the dermatologist and on 08/06/2014 he received a injection of 5 FU, 500 mg grams into the lesion. the pathology report was also sent and it was a squamous cell carcinoma well-differentiated and edges were involved. They wanted him to use many honey for the wound dressing changes to be done 3 times a week. 08/30/2014 -- he has  finished his second injection of 5-FU and has the next one in 2 weeks' time. He is doing fine otherwise. 09/13/2014 - No new complaints. No significant pain. No fever or chills. Minimal drainage. Still receiving 5-FU injections. 09/20/2014 -- He was seen by the  dermatologist on 09/10/2014 and Dr. Phillip Heal injected his foot both the right on the dorsum and left near the medial malleolus with 5-FU. The next dose of 5-FU is to be given after 3 months. The patient says he now has a spot on the left medial malleolus where he was injected with 5-FU. Electronic Signature(s) Signed: 09/20/2014 12:18:49 PM By: Christin Fudge MD, FACS Entered By: Christin Fudge on 09/20/2014 11:36:22 Philip [name], Philip [name] ([id number]) -------------------------------------------------------------------------------- Physical Exam Details Patient Name: CAYMEN, DUBRAY. Date of Service: 09/20/2014 10:15 AM Medical Record Number: [id number] Patient Account Number: [id number] Date of Birth/Sex: Apr 01, 1919 (81 y.o. Male) Treating RN: Primary Care Physician: Hortencia Pilar Other Clinician: Referring Physician: Hortencia Pilar Treating Physician/Extender: Philip [name] in Treatment: 20 Constitutional . Pulse regular. Respirations normal and unlabored. Afebrile. . Eyes Nonicteric. Reactive to light. Ears, Nose, Mouth, and Throat Lips, teeth, and gums WNL.Marland Kitchen Moist mucosa without lesions . Neck supple and nontender. No palpable supraclavicular or cervical adenopathy. Normal sized without goiter. Respiratory WNL. No retractions.. Cardiovascular Pedal Pulses WNL. No clubbing, cyanosis or edema. Integumentary (Hair, Skin) The dorsum of the right leg has a bit of debris which is possibly the Prisma which is stuck there. He has a new area on the left foot near the medial malleoli and this is where he has been recently given an injection.. No crepitus or fluctuance. No peri-wound warmth or erythema. No masses.Marland Kitchen Psychiatric Judgement  and insight Intact.. No evidence of depression, anxiety, or agitation.. Electronic Signature(s) Signed: 09/20/2014 12:18:49 PM By: Christin Fudge MD, FACS Entered By: Christin Fudge on 09/20/2014 11:37:52 Philip [name] ([id number]) -------------------------------------------------------------------------------- Physician Orders Details Patient Name: Philip [name], Philip [name]. Date of Service: 09/20/2014 10:15 AM Medical Record Number: [id number] Patient Account Number: [id number] Date of Birth/Sex: [birth year] (81 y.o. Male) Treating RN: Cornell Barman Primary Care Physician: Hortencia Pilar Other Clinician: Referring Physician: Hortencia Pilar Treating Physician/Extender: Philip [name] in Treatment: 20 Verbal / Phone Orders: Yes Clinician: Cornell Barman Read Back and Verified: Yes Diagnosis Coding Wound Cleansing Wound #3 Right,Dorsal Foot o Clean wound with Normal Saline. o May Shower, gently pat wound dry prior to applying new dressing. o May shower with protection. - DO NOT GET DRESSING WET. Wound #4 Left,Medial Malleolus o Clean wound with Normal Saline. o May Shower, gently pat wound dry prior to applying new dressing. o May shower with protection. - DO NOT GET DRESSING WET. Anesthetic Wound #3 Right,Dorsal Foot o Topical Lidocaine 4% cream applied to wound bed prior to debridement Wound #4 Left,Medial Malleolus o Topical Lidocaine 4% cream applied to wound bed prior to debridement Skin Barriers/Peri-Wound Care Wound #3 Right,Dorsal Foot o Barrier cream Wound #4 Left,Medial Malleolus o Barrier cream Primary Wound Dressing Wound #3 Right,Dorsal Foot o Prisma Ag Wound #4 Left,Medial Malleolus o Aquacel Ag Secondary Dressing Wound #3 Right,Dorsal Foot o Boardered Foam Dressing Philip [name], Philip [name]. ([id number]) Wound #4 Left,Medial Malleolus o Boardered Foam Dressing Dressing Change Frequency Wound #3 Right,Dorsal Foot o Change dressing every other  day. Wound #4 Left,Medial Malleolus o Change dressing every other day. Follow-up Appointments Wound #3 Right,Dorsal Foot o Return Appointment in 1 week. Wound #4 Left,Medial Malleolus o Return Appointment in 1 week. Home Health Wound #3 [place] Visits - [place] Nurse may visit PRN to address patientos wound care needs. o FACE TO FACE ENCOUNTER: MEDICARE and MEDICAID PATIENTS: I certify that this patient is under my care and that I had a face-to-face encounter that  meets the physician face-to-face encounter requirements with this patient on this date. The encounter with the patient was in whole or in part for the following MEDICAL CONDITION: (primary reason for [place]) MEDICAL NECESSITY: I certify, that based on my findings, NURSING services are a medically necessary home health service. HOME BOUND STATUS: I certify that my clinical findings support that this patient is homebound (i.e., Due to illness or injury, pt requires aid of supportive devices such as crutches, cane, wheelchairs, walkers, the use of special transportation or the assistance of another person to leave their place of residence. There is a normal inability to leave the home and doing so requires considerable and taxing effort. Other absences are for medical reasons / religious services and are infrequent or of short duration when for other reasons). o If current dressing causes regression in wound condition, may D/C ordered dressing product/s and apply Normal Saline Moist Dressing daily until next [place] / Other MD appointment. [place] of regression in wound condition at [phone]. o Please direct any NON-WOUND related issues/requests for orders to patient's Primary Care Physician Wound #4 [place] Visits - [place] Nurse may visit PRN to address patientos  wound care needs. o FACE TO FACE ENCOUNTER: MEDICARE and MEDICAID PATIENTS: I certify that this patient is under my care and that I had a face-to-face encounter that meets the physician face-to-face encounter requirements with this patient on this date. The encounter with the patient was in whole or in part for the following MEDICAL CONDITION: (primary reason for [place]) MEDICAL NECESSITY: I certify, that based on my findings, NURSING services are a medically Philip [name], Philip [name] ([id number]) necessary home health service. HOME BOUND STATUS: I certify that my clinical findings support that this patient is homebound (i.e., Due to illness or injury, pt requires aid of supportive devices such as crutches, cane, wheelchairs, walkers, the use of special transportation or the assistance of another person to leave their place of residence. There is a normal inability to leave the home and doing so requires considerable and taxing effort. Other absences are for medical reasons / religious services and are infrequent or of short duration when for other reasons). o If current dressing causes regression in wound condition, may D/C ordered dressing product/s and apply Normal Saline Moist Dressing daily until next [place] / Other MD appointment. [place] of regression in wound condition at [phone]. o Please direct any NON-WOUND related issues/requests for orders to patient's Primary Care Physician Electronic Signature(s) Signed: 09/20/2014 12:18:49 PM By: Christin Fudge MD, FACS Signed: 09/20/2014 1:28:45 PM By: Gretta Cool RN, BSN, Kim RN, BSN Entered By: Gretta Cool, RN, BSN, Kim on 09/20/2014 11:17:43 Philip [name] ([id number]) -------------------------------------------------------------------------------- Problem List Details Patient Name: Philip [name], Philip [name]. Date of Service: 09/20/2014 10:15 AM Medical Record Number: [id number] Patient Account Number: [id number] Date  of Birth/Sex: [birth year] (81 y.o. Male) Treating RN: Primary Care Physician: Hortencia Pilar Other Clinician: Referring Physician: Hortencia Pilar Treating Physician/Extender: Philip [name] in Treatment: 20 Active Problems ICD-10 Encounter Code Description Active Date Diagnosis I70.232 Atherosclerosis of native arteries of right leg with 06/21/2014 Yes ulceration of calf I82.401 Acute embolism and thrombosis of unspecified deep veins 06/21/2014 Yes of right lower extremity S91.301A Unspecified open wound, right foot, initial encounter 07/19/2014 Yes Z92.21 Personal history of antineoplastic chemotherapy 08/23/2014 Yes L97.522 Non-pressure chronic ulcer of other part of left foot with fat 09/20/2014 Yes layer exposed  Inactive Problems Resolved Problems ICD-10 Code Description Active Date Resolved Date L97.212 Non-pressure chronic ulcer of right calf with fat layer 06/21/2014 06/21/2014 exposed S51.812A Laceration without foreign body of left forearm, initial 06/21/2014 06/21/2014 encounter NYLES, MITTON ([id number]) Electronic Signature(s) Signed: 09/20/2014 12:18:49 PM By: Christin Fudge MD, FACS Entered By: Christin Fudge on 09/20/2014 11:22:38 Philip [name] ([id number]) -------------------------------------------------------------------------------- Progress Note Details Patient Name: Philip [name], Philip [name]. Date of Service: 09/20/2014 10:15 AM Medical Record Number: [id number] Patient Account Number: [id number] Date of Birth/Sex: 1918/09/10 (81 y.o. Male) Treating RN: Primary Care Physician: Hortencia Pilar Other Clinician: Referring Physician: Hortencia Pilar Treating Physician/Extender: Philip [name] in Treatment: 20 Subjective Chief Complaint Information obtained from Patient R foot ulcer. L forearm ulcer. 07/19/2014 -- about 2 weeks ago he had a surgical procedure done by dermatologist in [place] and has an open surgical wound on the dorsum of the right foot. History of Present  Illness (HPI) The following HPI elements were documented for the patient's wound: Location: right leg Duration: Dec 2015 Modifying Factors: history of an injury to the right leg with resulting hematoma and thrombophlebitis and later an ulcer of posterior leg Associated Signs and Symptoms: marked lymphedema of the right leg. He is already on Eloquis. 06/14/14 -- He returns for followup today. He denies any fevers. no fresh issues and his daughter says he's been doing fine. 06/21/14 -- after he sustained a fall this week earlier he applied a bandage over this himself and did not seek any medical attention. he did however manage to control the bleeding and had a dressing in place the next morning when his son to the visit. In this dressing was removed there was further damaged skin. His right leg has been doing fine otherwise. 07/12/14 --Very pleasant 81 year old with past medical history significant for congestive heart failure (EF 15%), peripheral vascular disease, and chronic kidney disease. He was hospitalized at [facility] in December 2015 for congestive heart failure. He says that he fell during his hospital course and developed a hematoma over his right calf. He was also diagnosed with a right lower extremity DVT for which he takes Eliquis. The hematoma subsequently turned into an ulceration around Christmas, which has healed. He subsequently developed an ulcer on his right dorsal foot and a traumatic left forearm ulcer. Per his report, he underwent biopsy of the right dorsal foot ulceration which demonstrated a skin cancer. His PCP and dermatologist office are both closed today. I reviewed his records in [place] but find no report of biopsy or pathology. He s without complaints today. No significant pain. No fever or chills. Minimal drainage. 07/19/2014 - the patient and his son tell me that about 2 weeks ago the dermatologist did a skin biopsy and this was a large area on the dorsum of his right  foot which was left open and no dressing instructions were recommended. Since then he has been called and told that it is a cancer and the need to do a further procedure but that will not happen until about 2 weeks from now. In the meanwhile the patient has not been taking care of his right foot. The left forearm where he had an abrasion and laceration is doing very well. Philip [name], Philip [name] ([id number]) 07/26/2014 -- Reports from 07/08/2014 from the dermatology group reviewed. A excision was done of a lesion located on the dorsum of the right foot and this was 1.7 cm in diameter which was a shave biopsy performed. The wound was left open  after appropriate cauterization and the patient was given this dressing instructions. 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 08/02/2014 -- all the original problems he came with have completely resolved. He now has a surgical wound on his right foot dorsum where a skin cancer was excised. He goes to see his dermatologist this coming Tuesday and will have definite news next Friday. 08/09/2014 he had gone to his dermatologist on Tuesday and she has injected the base of his ulcer with some chemotherapeutic agent. He was supposed to bring some papers with him but forgot to get them and will bring them in next week. Other than that the dermatologist had suggested using Mehdi honey on the wound. 08/16/2014 -- the patient has brought in his notes from the dermatologist and on 08/06/2014 he received a injection of 5 FU, 500 mg grams into the lesion. the pathology report was also sent and it was a squamous cell carcinoma well-differentiated and edges were involved. They wanted him to use many honey for the wound dressing changes to be done 3 times a week. 08/30/2014 -- he has finished his second injection of 5-FU and has the next one in 2 weeks' time. He is doing fine  otherwise. 09/13/2014 - No new complaints. No significant pain. No fever or chills. Minimal drainage. Still receiving 5-FU injections. 09/20/2014 -- He was seen by the dermatologist on 09/10/2014 and Dr. Phillip Heal injected his foot both the right on the dorsum and left near the medial malleolus with 5-FU. The next dose of 5-FU is to be given after 3 months. The patient says he now has a spot on the left medial malleolus where he was injected with 5-FU. Objective Constitutional Pulse regular. Respirations normal and unlabored. Afebrile. Vitals Time Taken: 10:55 AM, Height: 69 in, Weight: 179 lbs, BMI: 26.4, Temperature: 97.8 F, Pulse: 61 bpm, Respiratory Rate: 16 breaths/min, Blood Pressure: 124/49 mmHg. Eyes Nonicteric. Reactive to light. Ears, Nose, Mouth, and Throat Philip [name], Philip [name]. ([id number]) Lips, teeth, and gums WNL.Marland Kitchen Moist mucosa without lesions . Neck supple and nontender. No palpable supraclavicular or cervical adenopathy. Normal sized without goiter. Respiratory WNL. No retractions.. Cardiovascular Pedal Pulses WNL. No clubbing, cyanosis or edema. Psychiatric Judgement and insight Intact.. No evidence of depression, anxiety, or agitation.. Integumentary (Hair, Skin) The dorsum of the right leg has a bit of debris which is possibly the Prisma which is stuck there. He has a new area on the left foot near the medial malleoli and this is where he has been recently given an injection.. No crepitus or fluctuance. No peri-wound warmth or erythema. No masses.. Wound #3 status is Open. Original cause of wound was Surgical Injury. The wound is located on the Right,Dorsal Foot. The wound measures 1.3cm length x 1.3cm width x 0.3cm depth; 1.327cm^2 area and 0.398cm^3 volume. The wound is limited to skin breakdown. There is no tunneling or undermining noted. There is a small amount of serous drainage noted. The wound margin is distinct with the outline attached to the wound base. There is  medium (34-66%) red granulation within the wound bed. There is a medium (34- 66%) amount of necrotic tissue within the wound bed including Adherent Slough. The periwound skin appearance exhibited: Localized Edema, Moist, Erythema. The periwound skin appearance did not exhibit: Callus, Crepitus, Excoriation, Fluctuance, Friable, Induration, Rash, Scarring, Dry/Scaly, Maceration, Atrophie Blanche, Cyanosis, Ecchymosis, Hemosiderin Staining, Mottled, Pallor, Rubor. The surrounding wound skin color is noted  with erythema which is circumferential. Erythema is measured at 3 cm. Periwound temperature was noted as No Abnormality. Wound #4 status is Open. Original cause of wound was Other Lesion. The wound is located on the Left,Medial Malleolus. The wound measures 0.4cm length x 0.5cm width x 0.1cm depth; 0.157cm^2 area and 0.016cm^3 volume. The wound is limited to skin breakdown. There is no tunneling or undermining noted. There is a small amount of serous drainage noted. The wound margin is flat and intact. There is small (1-33%) red, pink granulation within the wound bed. There is a large (67-100%) amount of necrotic tissue within the wound bed including Adherent Slough. The periwound skin appearance did not exhibit: Callus, Crepitus, Excoriation, Fluctuance, Friable, Induration, Localized Edema, Rash, Scarring, Dry/Scaly, Maceration, Moist, Atrophie Blanche, Cyanosis, Ecchymosis, Hemosiderin Staining, Mottled, Pallor, Rubor, Erythema. Periwound temperature was noted as No Abnormality. The dorsum of the right leg has a bit of debris which is possibly the Prisma which is stuck there. He has a new area on the left foot near the medial malleoli and this is where he has been recently given an injection. 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 However there is a new wound on the left medial ankle where recently he has been injected with 5-FU. I presume this was for a seborrheic keratosis lesion. We will continue with Prisma on the right foot dorsum and on the left medial ankle I will start using silver alginate. He will come back and see me next week. 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 There was a Skin/Epidermis Open Wound/Selective [phone]) debridement with total area of 1.69 sq cm performed by Leyton Magoon, Jackson Latino., MD. with the following instrument(s): Forceps and gauze to remove Non-Viable tissue/material including Exudate and Fibrin/Slough after achieving pain control using Other (lidocaine 4%). A time out was conducted prior to the start of the procedure. There was no bleeding. The procedure was tolerated well with a pain level of 1 throughout and a pain level of 1 following the procedure. Post Debridement Measurements: 1.3cm length x 1.3cm width x 0.3cm depth; 0.398cm^3 volume. Wound #4 Wound #4 is a Malignant Wound located on the Left,Medial Malleolus . There was a Skin/Epidermis Open Wound/Selective [phone]) debridement with total area of 0.2 sq cm performed by Leonell Lobdell, Jackson Latino., MD. with the following instrument(s): Forceps to remove Non-Viable tissue/material including Exudate, Fibrin/Slough, and Eschar after achieving pain control using Other (lidocaine 4%). A time out was conducted prior to the start of the procedure. There was no bleeding. The procedure was tolerated well with a pain level of 1 throughout and a pain level of 1 following the procedure.  Post Debridement Measurements: 0.4cm length x 0.5cm width x 0.1cm depth; 0.016cm^3 volume. Plan Wound Cleansing: Philip [name], GASSMAN ([id number]) Wound #3 Right,Dorsal Foot: Clean wound with Normal Saline. May Shower, gently pat wound dry prior to applying new dressing. May shower with protection. - DO NOT GET DRESSING WET. Wound #4 Left,Medial Malleolus: Clean wound with Normal Saline. May Shower, gently pat wound dry  prior to applying new dressing. May shower with protection. - DO NOT GET DRESSING WET. Anesthetic: Wound #3 Right,Dorsal Foot: Topical Lidocaine 4% cream applied to wound bed prior to debridement Wound #4 Left,Medial Malleolus: Topical Lidocaine 4% cream applied to wound bed prior to debridement Skin Barriers/Peri-Wound Care: Wound #3 Right,Dorsal Foot: Barrier cream Wound #4 Left,Medial Malleolus: Barrier cream Primary Wound Dressing: Wound #3 Right,Dorsal Foot: Prisma Ag Wound #4 Left,Medial Malleolus: Aquacel Ag Secondary Dressing: Wound #3 Right,Dorsal Foot: Boardered Foam Dressing Wound #4 Left,Medial Malleolus: Boardered Foam Dressing Dressing Change Frequency: Wound #3 Right,Dorsal Foot: Change dressing every other day. Wound #4 Left,Medial Malleolus: Change dressing every other day. Follow-up Appointments: Wound #3 Right,Dorsal Foot: Return Appointment in 1 week. Wound #4 Left,Medial Malleolus: Return Appointment in 1 week. Home Health: Wound #3 Right,Dorsal Foot: [place] Visits - [place] Nurse may visit PRN to address patient s wound care needs. FACE TO FACE ENCOUNTER: MEDICARE and MEDICAID PATIENTS: I certify that this patient is under my care and that I had a face-to-face encounter that meets the physician face-to-face encounter requirements with this patient on this date. The encounter with the patient was in whole or in part for the following MEDICAL CONDITION: (primary reason for [place]) MEDICAL NECESSITY: I  certify, that based on my findings, NURSING services are a medically necessary home health service. HOME BOUND STATUS: I certify that my clinical findings support that this patient is homebound (i.e., Due to illness or injury, pt requires aid of supportive devices such as crutches, cane, wheelchairs, walkers, the use of special transportation or the assistance of another person to leave their place of residence. There is a normal inability to leave the home and doing so requires considerable and taxing effort. Other absences are JAIVIAN, BATTAGLINI ([id number]) for medical reasons / religious services and are infrequent or of short duration when for other reasons). If current dressing causes regression in wound condition, may D/C ordered dressing product/s and apply Normal Saline Moist Dressing daily until next [place] / Other MD appointment. [place] of regression in wound condition at [phone]. Please direct any NON-WOUND related issues/requests for orders to patient's Primary Care Physician Wound #4 Left,Medial Malleolus: [place] Visits - [place] Nurse may visit PRN to address patient s wound care needs. FACE TO FACE ENCOUNTER: MEDICARE and MEDICAID PATIENTS: I certify that this patient is under my care and that I had a face-to-face encounter that meets the physician face-to-face encounter requirements with this patient on this date. The encounter with the patient was in whole or in part for the following MEDICAL CONDITION: (primary reason for [place]) MEDICAL NECESSITY: I certify, that based on my findings, NURSING services are a medically necessary home health service. HOME BOUND STATUS: I certify that my clinical findings support that this patient is homebound (i.e., Due to illness or injury, pt requires aid of supportive devices such as crutches, cane, wheelchairs, walkers, the use of special transportation or the  assistance of another person to leave their place of residence. There is a normal inability to leave the home and doing so requires considerable and taxing effort. Other absences are for medical reasons / religious services and are infrequent or of short duration when for other reasons). If current dressing causes regression in wound condition, may D/C ordered dressing product/s and apply Normal Saline Moist Dressing daily until next [place] / Other MD appointment. [place] of  regression in wound condition at [phone]. Please direct any NON-WOUND related issues/requests for orders to patient's Primary Care Physician The right foot dorsum was fairly clean and we will continue with Prisma. However there is a new wound on the left medial ankle where recently he has been injected with 5-FU. I presume this was for a seborrheic keratosis lesion. We will continue with Prisma on the right foot dorsum and on the left medial ankle I will start using silver alginate. He will come back and see me next week. Electronic Signature(s) Signed: 09/20/2014 12:18:49 PM By: Christin Fudge MD, FACS Entered By: Christin Fudge on 09/20/2014 11:40:06 Philip [name] ([id number]) -------------------------------------------------------------------------------- SuperBill Details Patient Name: CLEVESTER, HELZER. Date of Service: 09/20/2014 Medical Record Number: [id number] Patient Account Number: [id number] Date of Birth/Sex: 08-03-1918 (81 y.o. 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 I82.401 Acute embolism and thrombosis of unspecified deep veins of right lower extremity S91.301A Unspecified open wound, right foot, initial  encounter Z92.21 Personal history of antineoplastic chemotherapy L97.522 Non-pressure chronic ulcer of other part of left foot with fat layer exposed Facility Procedures The patient participates with Medicare or their insurance follows the Medicare Facility Guidelines: CPT4 Code Description Modifier Quantity [id number] [phone] - WOUND CARE VISIT-LEV 2 EST PT 1 The patient participates with Medicare or their insurance follows the Medicare Facility Guidelines: [id number] - DEBRIDE WOUND 1ST 20 SQ CM OR < 1 ICD-10 Description Diagnosis S91.301A Unspecified open wound, right foot, initial encounter Z92.21  Personal history of antineoplastic chemotherapy L97.522 Non-pressure chronic ulcer of other part of left foot with fat layer exposed Physician Procedures CPT4 Code Description: [id number] - WC PHYS LEVEL 3 - EST PT ICD-10 Description Diagnosis S91.301A Unspecified open wound, right foot, initial encounter L97.522 Non-pressure chronic ulcer of other part of left foot with Modifier: 25 fat layer e Quantity: 1 xposed CPT4 Code Description: [id number] - WC PHYS DEBR WO ANESTH 20 SQ CM ICD-10 Description Diagnosis S91.301A Unspecified open wound, right foot, initial encounter Z92.21 Personal history of antineoplastic chemotherapy L97.522 Non-pressure chronic ulcer  of other part of left foot with ARIF, AMENDOLA ([id number]) Modifier: fat layer e Quantity: 1 xposed Electronic Signature(s) Signed: 09/20/2014 12:18:49 PM By: Christin Fudge MD, FACS Entered By: Christin Fudge on 09/20/2014 11:40:38

## 2014-09-21 NOTE — Progress Notes (Signed)
TYREASE, VANDEBERG ([id number]) Visit Report for 09/20/2014 Arrival Information Details Patient Name: Philip [name], Philip [name]. Date of Service: 09/20/2014 10:15 AM Medical Record Number: [id number] Patient Account Number: [id number] Date of Birth/Sex: 01/31/1919 (81 y.o. Male) Treating RN: Montey Hora Primary Care Physician: Hortencia Pilar Other Clinician: Referring Physician: Hortencia Pilar Treating Physician/Extender: Frann Rider in Treatment: 20 Visit Information History Since Last Visit Added or deleted any medications: No Patient Arrived: [name] Any new allergies or adverse reactions: No Arrival Time: 10:48 Had a fall or experienced change in No Accompanied By: cg activities of daily living that may affect Transfer Assistance: None risk of falls: Patient Identification Verified: Yes Signs or symptoms of abuse/neglect since last No Secondary Verification Process Yes visito Completed: Hospitalized since last visit: No Patient Requires Transmission- No Pain Present Now: No Based Precautions: Patient Has Alerts: Yes Patient Alerts: Patient on Blood Thinner Electronic Signature(s) Signed: 09/20/2014 5:16:47 PM By: Montey Hora Entered By: Montey Hora on 09/20/2014 10:54:46 Philip [name] ([id number]) -------------------------------------------------------------------------------- Clinic Level of Care Assessment Details Patient Name: Philip [name]. Date of Service: 09/20/2014 10:15 AM Medical Record Number: [id number] Patient Account Number: [id number] Date of Birth/Sex: March 08, 1919 (81 y.o. Male) Treating RN: Cornell Barman Primary Care Physician: Hortencia Pilar Other Clinician: Referring Physician: Hortencia Pilar Treating Physician/Extender: Frann Rider in Treatment: 20 Clinic Level of Care Assessment Items TOOL 3 Quantity Score []  - Use when EandM and Procedure is performed on FOLLOW-UP visit 0 ASSESSMENTS - Nursing Assessment / Reassessment X - Reassessment of  Co-morbidities (includes updates in patient status) 1 10 []  - Reassessment of Adherence to Treatment Plan 0 ASSESSMENTS - Wound and Skin Assessment / Reassessment []  - Points for Wound Assessment can only be taken for a new wound of unknown 0 or different etiology and a procedure is NOT performed to that wound X - Simple Wound Assessment / Reassessment - one wound 1 5 []  - Complex Wound Assessment / Reassessment - multiple wounds 0 []  - Dermatologic / Skin Assessment (not related to wound area) 0 ASSESSMENTS - Focused Assessment []  - Circumferential Edema Measurements - multi extremities 0 []  - Nutritional Assessment / Counseling / Intervention 0 []  - Lower Extremity Assessment (monofilament, tuning fork, pulses) 0 []  - Peripheral Arterial Disease Assessment (using hand held doppler) 0 ASSESSMENTS - Ostomy and/or Continence Assessment and Care []  - Incontinence Assessment and Management 0 []  - Ostomy Care Assessment and Management (repouching, etc.) 0 PROCESS - Coordination of Care []  - Points for Discharge Coordination can only be taken for a new wound of 0 unknown or different etiology and a procedure is NOT performed to that wound X - Simple Patient / Family Education for ongoing care 1 15 []  - Complex (extensive) Patient / Family Education for ongoing care 0 Philip [name], Philip [name] ([id number]) []  - Staff obtains Consents, Records, Test Results / Process Orders 0 []  - Staff telephones HHA, Nursing Homes / Clarify orders / etc 0 []  - Routine Transfer to another Facility (non-emergent condition) 0 []  - Routine Hospital Admission (non-emergent condition) 0 []  - New Admissions / Biomedical engineer / Ordering NPWT, Apligraf, etc. 0 []  - Emergency Hospital Admission (emergent condition) 0 X - Simple Discharge Coordination 1 10 []  - Complex (extensive) Discharge Coordination 0 PROCESS - Special Needs []  - Pediatric / Minor Patient Management 0 []  - Isolation Patient Management 0 []  -  Hearing / Language / Visual special needs 0 []  - Assessment of Community assistance (transportation, D/C planning, etc.) 0 []  -  Additional assistance / Altered mentation 0 []  - Support Surface(s) Assessment (bed, cushion, seat, etc.) 0 INTERVENTIONS - Wound Cleansing / Measurement []  - Points for Wound Cleaning / Measurement, Wound Dressing, Specimen 0 Collection and Specimen taken to lab can only be taken for a new wound of unknown or different etiology and a procedure is NOT performed to that wound X - Simple Wound Cleansing - one wound 1 5 []  - Complex Wound Cleansing - multiple wounds 0 X - Wound Imaging (photographs - any number of wounds) 1 5 []  - Wound Tracing (instead of photographs) 0 X - Simple Wound Measurement - one wound 1 5 []  - Complex Wound Measurement - multiple wounds 0 INTERVENTIONS - Wound Dressings X - Small Wound Dressing one or multiple wounds 1 10 Philip [name], Philip [name]. ([id number]) []  - Medium Wound Dressing one or multiple wounds 0 []  - Large Wound Dressing one or multiple wounds 0 INTERVENTIONS - Miscellaneous []  - External ear exam 0 []  - Specimen Collection (cultures, biopsies, blood, body fluids, etc.) 0 []  - Specimen(s) / Culture(s) sent or taken to Lab for analysis 0 []  - Patient Transfer (multiple staff / Civil Service fast streamer / Similar devices) 0 []  - Simple Staple / Suture removal (25 or less) 0 []  - Complex Staple / Suture removal (26 or more) 0 []  - Hypo / Hyperglycemic Management (close monitor of Blood Glucose) 0 []  - Ankle / Brachial Index (ABI) - do not check if billed separately 0 X - Vital Signs 1 5 Has the patient been seen at the hospital within the last three years: Yes Total Score: 70 Level Of Care: New/Established - Level 2 Electronic Signature(s) Signed: 09/20/2014 1:28:45 PM By: Gretta Cool, RN, BSN, Kim RN, BSN Entered By: Gretta Cool, RN, BSN, Kim on 09/20/2014 11:19:26 Philip [name]  ([id number]) -------------------------------------------------------------------------------- Encounter Discharge Information Details Patient Name: Philip [name], Philip [name]. Date of Service: 09/20/2014 10:15 AM Medical Record Number: [id number] Patient Account Number: [id number] Date of Birth/Sex: 1918-07-13 (81 y.o. Male) Treating RN: Primary Care Physician: Hortencia Pilar Other Clinician: Referring Physician: Hortencia Pilar Treating Physician/Extender: Frann Rider in Treatment: 20 Encounter Discharge Information Items Discharge Pain Level: 0 Discharge Condition: Stable Ambulatory Status: Cane Discharge Destination: Home Transportation: Private Auto Accompanied By: Mady Gemma Schedule Follow-up Appointment: Yes Medication Reconciliation completed No and provided to Patient/Care Manuel Lawhead: Provided on Clinical Summary of Care: 09/20/2014 Form Type Recipient Paper Patient GT Electronic Signature(s) Signed: 09/20/2014 5:16:47 PM By: Montey Hora Previous Signature: 09/20/2014 11:28:08 AM Version By: Ruthine Dose Entered By: Montey Hora on 09/20/2014 11:43:41 Philip [name] ([id number]) -------------------------------------------------------------------------------- Lower Extremity Assessment Details Patient Name: Philip [name], Philip [name]. Date of Service: 09/20/2014 10:15 AM Medical Record Number: [id number] Patient Account Number: [id number] Date of Birth/Sex: 10/21/1918 (81 y.o. Male) Treating RN: Montey Hora Primary Care Physician: Hortencia Pilar Other Clinician: Referring Physician: Hortencia Pilar Treating Physician/Extender: Frann Rider in Treatment: 20 Vascular Assessment Pulses: Posterior Tibial Dorsalis Pedis Palpable: [Left:Yes] [Right:Yes] Extremity colors, hair growth, and conditions: Extremity Color: [Left:Normal] [Right:Normal] Hair Growth on Extremity: [Left:No] [Right:No] Temperature of Extremity: [Left:Warm] [Right:Warm] Capillary Refill: [Left:< 3 seconds]  [Right:< 3 seconds] Toe Nail Assessment Left: Right: Thick: Yes Yes Discolored: Yes Yes Deformed: Yes Yes Improper Length and Hygiene: No No Electronic Signature(s) Signed: 09/20/2014 5:16:47 PM By: Montey Hora Entered By: Montey Hora on 09/20/2014 11:06:15 Philip [name] ([id number]) -------------------------------------------------------------------------------- Multi Wound Chart Details Patient Name: Philip [name]. Date of Service: 09/20/2014 10:15 AM Medical Record Number: [id number] Patient Account Number: [id number] Date  of Birth/Sex: [birth year] (81 y.o. 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] [4:Malignant Wound] [N/A:N/A] Comorbid History: [3:Cataracts, Arrhythmia, Congestive Heart Failure] [4:Cataracts, Arrhythmia, Congestive Heart Failure] [N/A:N/A] Date Acquired: [3:07/01/2014] [4:09/20/2014] [N/A:N/A] Weeks of Treatment: [3:10] [4:0] [N/A:N/A] Wound Status: [3:Open] [4:Open] [N/A:N/A] Measurements L x W x D 1.3x1.3x0.3 [4:0.4x0.5x0.1] [N/A:N/A] (cm) Area (cm) : [3:1.327] [4:0.157] [N/A:N/A] Volume (cm) : [3:0.398] [4:0.016] [N/A:N/A] % Reduction in Area: [3:29.60%] [4:0.00%] [N/A:N/A] % Reduction in Volume: 47.20% [4:0.00%] [N/A:N/A] Classification: [3:Full Thickness Without Exposed Support Structures] [4:Partial Thickness] [N/A:N/A] Exudate Amount: [3:Small] [4:Small] [N/A:N/A] Exudate Type: [3:Serous] [4:Serous]  [N/A:N/A] Exudate Color: [3:amber] [4:amber] [N/A:N/A] Wound Margin: [3:Distinct, outline attached] [4:Flat and Intact] [N/A:N/A] Granulation Amount: [3:Medium (34-66%)] [4:Small (1-33%)] [N/A:N/A] Granulation Quality: [3:Red] [4:Red, Pink] [N/A:N/A] Necrotic Amount: [3:Medium (34-66%)] [4:Large (67-100%)] [N/A:N/A] Exposed Structures: [3:Fascia: No Fat: No Tendon: No Muscle: No Joint: No] [4:Fascia: No Fat: No Tendon: No Muscle: No Joint: No] [N/A:N/A] Bone: No Bone: No Limited to Skin Limited to Skin Breakdown Breakdown Epithelialization: Small (1-33%) None N/A Periwound Skin Texture: Edema: Yes Edema: No N/A Excoriation: No Excoriation: No Induration: No Induration: No Callus: No Callus: No Crepitus: No Crepitus: No Fluctuance: No Fluctuance: No Friable: No Friable: No Rash: No Rash: No Scarring: No Scarring: No Periwound Skin Moist: Yes Maceration: No N/A Moisture: Maceration: No Moist: No Dry/Scaly: No Dry/Scaly: No Periwound Skin Color: Erythema: Yes Atrophie Blanche: No N/A Atrophie Blanche: No Cyanosis: No Cyanosis: No Ecchymosis: No Ecchymosis: No Erythema: No Hemosiderin Staining: No Hemosiderin Staining: No Mottled: No Mottled: No Pallor: No Pallor: No Rubor: No Rubor: No Erythema Location: Circumferential N/A N/A Erythema Measurement: Measured: 3cm N/A N/A Temperature: No Abnormality No Abnormality N/A Tenderness on No No N/A Palpation: Wound Preparation: Ulcer Cleansing: Ulcer Cleansing: N/A Rinsed/Irrigated with Rinsed/Irrigated with Saline Saline Topical Anesthetic Topical Anesthetic Applied: Other: lidocaine Applied: Other: lidocaine 4% 4% Treatment Notes Electronic Signature(s) Signed: 09/20/2014 1:28:45 PM By: Gretta Cool, RN, BSN, Kim RN, BSN Entered By: Gretta Cool, RN, BSN, Kim on 09/20/2014 11:09:35 Philip [name] ([id number]) -------------------------------------------------------------------------------- Multi-Disciplinary Care Plan  Details Patient Name: Philip [name], Philip [name]. Date of Service: 09/20/2014 10:15 AM Medical Record Number: [id number] Patient Account Number: [id number] Date of Birth/Sex: 05-26-1918 (81 y.o. Male) Treating RN: Cornell Barman Primary Care Physician: Hortencia Pilar Other Clinician: Referring Physician: Hortencia Pilar Treating Physician/Extender: Frann Rider in Treatment: 44 Active Inactive Abuse / Safety / Falls / Self Care Management Nursing Diagnoses: Abuse or neglect; actual or potential Potential for falls Goals: Patient will remain injury free Date Initiated: 05/02/2014 Goal Status: Active Interventions: Assess fall risk on admission and as needed Notes: Necrotic Tissue Nursing Diagnoses: Impaired tissue integrity related to necrotic/devitalized tissue Goals: Necrotic/devitalized tissue will be minimized in the wound bed Date Initiated: 05/02/2014 Goal Status: Active Interventions: Assess patient pain level pre-, during and post procedure and prior to discharge Treatment Activities: Apply topical anesthetic as ordered : 09/20/2014 Notes: Orientation to the Wound Care Program Nursing Diagnoses: Knowledge deficit related to the wound healing center program Philip [name], Philip [name] ([id number]) Goals: Patient/caregiver will verbalize understanding of the Jack Date Initiated: 05/02/2014 Goal  Status: Active Interventions: Provide education on orientation to the wound center Notes: Electronic Signature(s) Signed: 09/20/2014 1:28:45 PM By: Gretta Cool, RN, BSN, Kim RN, BSN Entered By: Gretta Cool, RN, BSN, Kim on 09/20/2014 11:09:25 Philip [name] ([id number]) -------------------------------------------------------------------------------- Patient/Caregiver Education Details Patient Name: Philip [name], Philip [name]. Date of Service: 09/20/2014 10:15 AM Medical Record Number: [id number] Patient Account Number: [id number] Date of Birth/Gender: 1918/11/07 (81 y.o. Male) Treating RN: Montey Hora Primary Care Physician: Hortencia Pilar Other Clinician: Referring Physician: Hortencia Pilar Treating Physician/Extender: Frann Rider in Treatment: 20 Education Assessment Education Provided To: Patient and Caregiver Education Topics Provided Wound/Skin Impairment: Handouts: Other: wound care as ordered Methods: Demonstration, Explain/Verbal Responses: State content correctly Electronic Signature(s) Signed: 09/20/2014 5:16:47 PM By: Montey Hora Entered By: Montey Hora on 09/20/2014 11:43:59 Philip [name] ([id number]) -------------------------------------------------------------------------------- Wound Assessment Details Patient Name: Philip [name], Philip [name]. Date of Service: 09/20/2014 10:15 AM Medical Record Number: [id number] Patient Account Number: [id number] Date of Birth/Sex: 04-Aug-1918 (81 y.o. Male) Treating RN: Montey Hora Primary Care Physician: Hortencia Pilar Other Clinician: Referring Physician: Hortencia Pilar Treating Physician/Extender: Frann Rider in Treatment: 20 Wound Status Wound Number: 3 Primary Malignant Wound Etiology: Wound Location: Right Foot - Dorsal Wound Status: Open Wounding Event: Surgical Injury Comorbid Cataracts, Arrhythmia, Congestive Date Acquired: 07/01/2014 History: Heart Failure Weeks Of Treatment: 10 Clustered Wound: No Photos Photo Uploaded By: Montey Hora on 09/20/2014 13:06:00 Wound Measurements Length: (cm) 1.3 Width: (cm) 1.3 Depth: (cm) 0.3 Area: (cm) 1.327 Volume: (cm) 0.398 % Reduction in Area: 29.6% % Reduction in Volume: 47.2% Epithelialization: Small (1-33%) Tunneling: No Undermining: No Wound Description Full Thickness Without Exposed Foul Odor Afte Classification: Support Structures Wound Margin: Distinct, outline attached Exudate Small Amount: Exudate Type: Serous Exudate Color: amber r Cleansing: No Wound Bed Granulation Amount: Medium (34-66%) Exposed  Structure Granulation Quality: Red Fascia Exposed: No Necrotic Amount: Medium (34-66%) Fat Layer Exposed: No Philip [name], Philip [name] ([id number]) Necrotic Quality: Adherent Slough Tendon Exposed: No Muscle Exposed: No Joint Exposed: No Bone Exposed: No Limited to Skin Breakdown Periwound Skin Texture Texture Color No Abnormalities Noted: No No Abnormalities Noted: No Callus: No Atrophie Blanche: No Crepitus: No Cyanosis: No Excoriation: No Ecchymosis: No Fluctuance: No Erythema: Yes Friable: No Erythema Location: Circumferential Induration: No Erythema Measurement: Measured Localized Edema: Yes 3 cm Rash: No Hemosiderin Staining: No Scarring: No Mottled: No Pallor: No Moisture Rubor: No No Abnormalities Noted: No Dry / Scaly: No Temperature / Pain Maceration: No Temperature: No Abnormality Moist: Yes Wound Preparation Ulcer Cleansing: Rinsed/Irrigated with Saline Topical Anesthetic Applied: Other: lidocaine 4%, Treatment Notes Wound #3 (Right, Dorsal Foot) 1. Cleansed with: Clean wound with Normal Saline 2. Anesthetic Topical Lidocaine 4% cream to wound bed prior to debridement 3. Peri-wound Care: Skin Prep 4. Dressing Applied: Prisma Ag 5. Secondary Dressing Applied Bordered Foam Dressing Electronic Signature(s) Signed: 09/20/2014 5:16:47 PM By: Montey Hora Entered By: Montey Hora on 09/20/2014 11:06:34 Philip [name] ([id number]) -------------------------------------------------------------------------------- Wound Assessment Details Patient Name: Philip [name], Philip [name]. Date of Service: 09/20/2014 10:15 AM Medical Record Number: [id number] Patient Account Number: [id number] Date of Birth/Sex: 1918/10/10 (81 y.o. Male) Treating RN: Montey Hora Primary Care Physician: Hortencia Pilar Other Clinician: Referring Physician: Hortencia Pilar Treating Physician/Extender: Frann Rider in Treatment: 20 Wound Status Wound Number: 4 Primary Malignant  Wound Etiology: Wound Location: Left Malleolus - Medial Wound Status: Open Wounding Event: Other Lesion Comorbid Cataracts, Arrhythmia, Congestive Date Acquired: 09/20/2014 History: Heart Failure Weeks Of Treatment: 0 Clustered Wound: No Photos Photo Uploaded By:  Montey Hora on 09/20/2014 13:06:33 Wound Measurements Length: (cm) 0.4 Width: (cm) 0.5 Depth: (cm) 0.1 Area: (cm) 0.157 Volume: (cm) 0.016 % Reduction in Area: 0% % Reduction in Volume: 0% Epithelialization: None Tunneling: No Undermining: No Wound Description Classification: Partial Thickness Wound Margin: Flat and Intact Exudate Amount: Small Exudate Type: Serous Exudate Color: amber Foul Odor After Cleansing: No Wound Bed Granulation Amount: Small (1-33%) Exposed Structure Granulation Quality: Red, Pink Fascia Exposed: No Necrotic Amount: Large (67-100%) Fat Layer Exposed: No Necrotic Quality: Adherent Slough Tendon Exposed: No Philip [name], Philip [name] ([id number]) Muscle Exposed: No Joint Exposed: No Bone Exposed: No Limited to Skin Breakdown Periwound Skin Texture Texture Color No Abnormalities Noted: No No Abnormalities Noted: No Callus: No Atrophie Blanche: No Crepitus: No Cyanosis: No Excoriation: No Ecchymosis: No Fluctuance: No Erythema: No Friable: No Hemosiderin Staining: No Induration: No Mottled: No Localized Edema: No Pallor: No Rash: No Rubor: No Scarring: No Temperature / Pain Moisture Temperature: No Abnormality No Abnormalities Noted: No Dry / Scaly: No Maceration: No Moist: No Wound Preparation Ulcer Cleansing: Rinsed/Irrigated with Saline Topical Anesthetic Applied: Other: lidocaine 4%, Treatment Notes Wound #4 (Left, Medial Malleolus) 1. Cleansed with: Clean wound with Normal Saline 2. Anesthetic Topical Lidocaine 4% cream to wound bed prior to debridement 3. Peri-wound Care: Skin Prep 4. Dressing Applied: Aquacel Ag 5. Secondary Dressing Applied Bordered  Foam Dressing Electronic Signature(s) Signed: 09/20/2014 5:16:47 PM By: Montey Hora Entered By: Montey Hora on 09/20/2014 11:07:19 Philip [name] ([id number]) -------------------------------------------------------------------------------- [place] Details Patient Name: Philip [name], CUTLER. Date of Service: 09/20/2014 10:15 AM Medical Record Number: [id number] Patient Account Number: [id number] Date of Birth/Sex: May 25, 1918 (81 y.o. Male) Treating RN: Montey Hora Primary Care Physician: Hortencia Pilar Other Clinician: Referring Physician: Hortencia Pilar Treating Physician/Extender: Frann Rider in Treatment: 20 Vital Signs Time Taken: 10:55 Temperature (F): 97.8 Height (in): 69 Pulse (bpm): 61 Weight (lbs): 179 Respiratory Rate (breaths/min): 16 Body Mass Index (BMI): 26.4 Blood Pressure (mmHg): 124/49 Reference Range: 80 - 120 mg / dl Electronic Signature(s) Signed: 09/20/2014 5:16:47 PM By: Montey Hora Entered By: Montey Hora on 09/20/2014 10:55:19

## 2014-09-27 ENCOUNTER — Encounter: Payer: Medicare Other | Admitting: Surgery

## 2014-09-27 DIAGNOSIS — S91301A Unspecified open wound, right foot, initial encounter: Secondary | ICD-10-CM | POA: Diagnosis not present

## 2014-09-28 NOTE — Progress Notes (Signed)
Philip [name] ([id number]) Visit Report for 09/27/2014 Arrival Information Details Patient Name: Philip [name], Philip [name]. Date of Service: 09/27/2014 9:00 AM Medical Record Number: [id number] Patient Account Number: [id number] Date of Birth/Sex: 11-29-1918 (81 y.o. Male) Treating RN: Montey Hora Primary Care Physician: Hortencia Pilar Other Clinician: Referring Physician: Hortencia Pilar Treating Physician/Extender: Frann Rider in Treatment: 21 Visit Information History Since Last Visit Added or deleted any medications: No Patient Arrived: Cane Any new allergies or adverse reactions: No Arrival Time: 09:08 Had a fall or experienced change in No Accompanied By: dtr activities of daily living that may affect Transfer Assistance: None risk of falls: Patient Identification Verified: Yes Signs or symptoms of abuse/neglect since last No Secondary Verification Process Yes visito Completed: Hospitalized since last visit: No Patient Requires Transmission- No Pain Present Now: No Based Precautions: Patient Has Alerts: Yes Patient Alerts: Patient on Blood Thinner Electronic Signature(s) Signed: 09/27/2014 4:12:17 PM By: Montey Hora Entered By: Montey Hora on 09/27/2014 09:14:04 [name] ([id number]) -------------------------------------------------------------------------------- Encounter Discharge Information Details Patient Name: Philip [name]. Date of Service: 09/27/2014 9:00 AM Medical Record Number: [id number] Patient Account Number: [id number] Date of Birth/Sex: 1919/04/02 (81 y.o. Male) Treating RN: Primary Care Physician: Hortencia Pilar Other Clinician: Referring Physician: Hortencia Pilar Treating Physician/Extender: Frann Rider in Treatment: 21 Encounter Discharge Information Items Discharge Pain Level: 0 Discharge Condition: Stable Ambulatory Status: Cane Discharge Destination: Home Transportation: Private Auto Accompanied By: dtr Schedule  Follow-up Appointment: Yes Medication Reconciliation completed No and provided to Patient/Care Josie Mesa: Provided on Clinical Summary of Care: 09/27/2014 Form Type Recipient Paper Patient GT Electronic Signature(s) Signed: 09/27/2014 2:23:08 PM By: Montey Hora Previous Signature: 09/27/2014 9:54:01 AM Version By: Ruthine Dose Entered By: Montey Hora on 09/27/2014 14:23:08 [name] ([id number]) -------------------------------------------------------------------------------- Lower Extremity Assessment Details Patient Name: Philip [name]. Date of Service: 09/27/2014 9:00 AM Medical Record Number: [id number] Patient Account Number: [id number] Date of Birth/Sex: [birth year] (81 y.o. Male) Treating RN: Montey Hora Primary Care Physician: Hortencia Pilar Other Clinician: Referring Physician: Hortencia Pilar Treating Physician/Extender: Frann Rider in Treatment: 21 Vascular Assessment Pulses: Posterior Tibial Dorsalis Pedis Palpable: [Left:Yes] [Right:Yes] Electronic Signature(s) Signed: 09/27/2014 4:12:17 PM By: Montey Hora Entered By: Montey Hora on 09/27/2014 09:22:35 [name] ([id number]) -------------------------------------------------------------------------------- Multi Wound Chart Details Patient Name: Philip [name]. Date of Service: 09/27/2014 9:00 AM Medical Record Number: [id number] Patient Account Number: [id number] Date of Birth/Sex: [birth year] (81 y.o. Male) Treating RN: Cornell Barman Primary Care Physician: Hortencia Pilar Other Clinician: Referring Physician: Hortencia Pilar Treating Physician/Extender: Frann Rider in Treatment: 21 Vital Signs Height(in): 69 Pulse(bpm): 65 Weight(lbs): 179 Blood Pressure 116/57 (mmHg): Body Mass Index(BMI): 26 Temperature(F): 97.9 Respiratory Rate 18 (breaths/min): Photos: [3:No Photos] [4:No Photos] [N/A:N/A] Wound Location: [3:Right Foot - Dorsal] [4:Left Malleolus - Medial]  [N/A:N/A] Wounding Event: [3:Surgical Injury] [4:Other Lesion] [N/A:N/A] Primary Etiology: [3:Malignant Wound] [4:Malignant Wound] [N/A:N/A] Comorbid History: [3:Cataracts, Arrhythmia, Congestive Heart Failure] [4:Cataracts, Arrhythmia, Congestive Heart Failure] [N/A:N/A] Date Acquired: [3:07/01/2014] [4:09/20/2014] [N/A:N/A] Weeks of Treatment: [3:11] [4:1] [N/A:N/A] Wound Status: [3:Open] [4:Open] [N/A:N/A] Measurements L x W x D 1.2x1.3x0.3 [4:0.6x0.8x0.2] [N/A:N/A] (cm) Area (cm) : [3:1.225] [4:0.377] [N/A:N/A] Volume (cm) : [3:0.368] [4:0.075] [N/A:N/A] % Reduction in Area: [3:35.00%] [4:-140.10%] [N/A:N/A] % Reduction in Volume: 51.20% [4:-368.70%] [N/A:N/A] Classification: [3:Full Thickness Without Exposed Support Structures] [4:Partial Thickness] [N/A:N/A] Exudate Amount: [3:Small] [4:Small] [N/A:N/A] Exudate Type: [3:Serous] [4:Serous] [N/A:N/A] Exudate Color: [3:amber] [4:amber] [N/A:N/A] Wound Margin: [3:Distinct, outline attached] [4:Flat and Intact] [N/A:N/A] Granulation Amount: [3:Medium (34-66%)] [4:Small (1-33%)] [  N/A:N/A] Granulation Quality: [3:Red] [4:Red, Pink] [N/A:N/A] Necrotic Amount: [3:Medium (34-66%)] [4:Large (67-100%)] [N/A:N/A] Exposed Structures: [3:Fascia: No Fat: No Tendon: No Muscle: No Joint: No] [4:Fascia: No Fat: No Tendon: No Muscle: No Joint: No] [N/A:N/A] Bone: No Bone: No Limited to Skin Limited to Skin Breakdown Breakdown Epithelialization: Small (1-33%) None N/A Periwound Skin Texture: Edema: Yes Edema: No N/A Excoriation: No Excoriation: No Induration: No Induration: No Callus: No Callus: No Crepitus: No Crepitus: No Fluctuance: No Fluctuance: No Friable: No Friable: No Rash: No Rash: No Scarring: No Scarring: No Periwound Skin Moist: Yes Maceration: No N/A Moisture: Maceration: No Moist: No Dry/Scaly: No Dry/Scaly: No Periwound Skin Color: Erythema: Yes Atrophie Blanche: No N/A Atrophie Blanche: No Cyanosis:  No Cyanosis: No Ecchymosis: No Ecchymosis: No Erythema: No Hemosiderin Staining: No Hemosiderin Staining: No Mottled: No Mottled: No Pallor: No Pallor: No Rubor: No Rubor: No Erythema Location: Circumferential N/A N/A Erythema Measurement: Measured: 3cm N/A N/A Temperature: No Abnormality No Abnormality N/A Tenderness on No No N/A Palpation: Wound Preparation: Ulcer Cleansing: Ulcer Cleansing: N/A Rinsed/Irrigated with Rinsed/Irrigated with Saline Saline Topical Anesthetic Topical Anesthetic Applied: Other: lidocaine Applied: Other: lidocaine 4% 4% Treatment Notes Electronic Signature(s) Signed: 09/27/2014 4:53:24 PM By: Gretta Cool, RN, BSN, Kim RN, BSN Entered By: Gretta Cool, RN, BSN, Kim on 09/27/2014 09:31:56 [name] ([id number]) -------------------------------------------------------------------------------- [place] Details Patient Name: ANA, LIAW. Date of Service: 09/27/2014 9:00 AM Medical Record Number: [id number] Patient Account Number: [id number] Date of Birth/Sex: 04/06/1919 (81 y.o. Male) Treating RN: Cornell Barman Primary Care Physician: Hortencia Pilar Other Clinician: Referring Physician: Hortencia Pilar Treating Physician/Extender: Frann Rider in Treatment: 78 Active Inactive Abuse / Safety / Falls / Self Care Management Nursing Diagnoses: Abuse or neglect; actual or potential Potential for falls Goals: Patient will remain injury free Date Initiated: 05/02/2014 Goal Status: Active Interventions: Assess fall risk on admission and as needed Notes: Necrotic Tissue Nursing Diagnoses: Impaired tissue integrity related to necrotic/devitalized tissue Goals: Necrotic/devitalized tissue will be minimized in the wound bed Date Initiated: 05/02/2014 Goal Status: Active Interventions: Assess patient pain level pre-, during and post procedure and prior to discharge Treatment Activities: Apply topical anesthetic as ordered :  09/27/2014 Notes: Orientation to the Wound Care Program Nursing Diagnoses: Knowledge deficit related to the wound healing center program JAMONE, GARRIDO ([id number]) Goals: Patient/caregiver will verbalize understanding of the [place] Program Date Initiated: 05/02/2014 Goal Status: Active Interventions: Provide education on orientation to the wound center Notes: Electronic Signature(s) Signed: 09/27/2014 4:53:24 PM By: Gretta Cool, RN, BSN, Kim RN, BSN Entered By: Gretta Cool, RN, BSN, Kim on 09/27/2014 09:31:48 [name] ([id number]) -------------------------------------------------------------------------------- Patient/Caregiver Education Details Patient Name: KEONA, SHEFFLER. Date of Service: 09/27/2014 9:00 AM Medical Record Number: [id number] Patient Account Number: [id number] Date of Birth/Gender: 01-28-1919 (81 y.o. Male) Treating RN: Montey Hora Primary Care Physician: Hortencia Pilar Other Clinician: Referring Physician: Hortencia Pilar Treating Physician/Extender: Frann Rider in Treatment: 21 Education Assessment Education Provided To: Patient and Caregiver Education Topics Provided Wound/Skin Impairment: Handouts: Other: wound care as ordered Methods: Demonstration, Explain/Verbal Responses: State content correctly Electronic Signature(s) Signed: 09/27/2014 2:23:27 PM By: Montey Hora Entered By: Montey Hora on 09/27/2014 14:23:27 [name] ([id number]) -------------------------------------------------------------------------------- Wound Assessment Details Patient Name: JASEAN, AMBROSIA. Date of Service: 09/27/2014 9:00 AM Medical Record Number: [id number] Patient Account Number: [id number] Date of Birth/Sex: 1918-07-29 (81 y.o. Male) Treating RN: Montey Hora Primary Care Physician: Hortencia Pilar Other Clinician: Referring Physician: Hortencia Pilar Treating Physician/Extender: Frann Rider  in Treatment: 21 Wound  Status Wound Number: 3 Primary Malignant Wound Etiology: Wound Location: Right Foot - Dorsal Wound Status: Open Wounding Event: Surgical Injury Comorbid Cataracts, Arrhythmia, Congestive Date Acquired: 07/01/2014 History: Heart Failure Weeks Of Treatment: 11 Clustered Wound: No Photos Photo Uploaded By: Montey Hora on 09/27/2014 13:37:44 Wound Measurements Length: (cm) 1.2 Width: (cm) 1.3 Depth: (cm) 0.3 Area: (cm) 1.225 Volume: (cm) 0.368 % Reduction in Area: 35% % Reduction in Volume: 51.2% Epithelialization: Small (1-33%) Tunneling: No Undermining: No Wound Description Full Thickness Without Exposed Foul Odor Afte Classification: Support Structures Wound Margin: Distinct, outline attached Exudate Small Amount: Exudate Type: Serous Exudate Color: amber r Cleansing: No Wound Bed Granulation Amount: Medium (34-66%) Exposed Structure Granulation Quality: Red Fascia Exposed: No Necrotic Amount: Medium (34-66%) Fat Layer Exposed: No MICHAEAL, DAVIS ([id number]) Necrotic Quality: Adherent Slough Tendon Exposed: No Muscle Exposed: No Joint Exposed: No Bone Exposed: No Limited to Skin Breakdown Periwound Skin Texture Texture Color No Abnormalities Noted: No No Abnormalities Noted: No Callus: No Atrophie Blanche: No Crepitus: No Cyanosis: No Excoriation: No Ecchymosis: No Fluctuance: No Erythema: Yes Friable: No Erythema Location: Circumferential Induration: No Erythema Measurement: Measured Localized Edema: Yes 3 cm Rash: No Hemosiderin Staining: No Scarring: No Mottled: No Pallor: No Moisture Rubor: No No Abnormalities Noted: No Dry / Scaly: No Temperature / Pain Maceration: No Temperature: No Abnormality Moist: Yes Wound Preparation Ulcer Cleansing: Rinsed/Irrigated with Saline Topical Anesthetic Applied: Other: lidocaine 4%, Treatment Notes Wound #3 (Right, Dorsal Foot) 1. Cleansed with: Clean wound with Normal Saline 2.  Anesthetic Topical Lidocaine 4% cream to wound bed prior to debridement 3. Peri-wound Care: Skin Prep 4. Dressing Applied: Prisma Ag 5. Secondary Dressing Applied Bordered Foam Dressing Electronic Signature(s) Signed: 09/27/2014 9:28:54 AM By: Montey Hora Entered By: Montey Hora on 09/27/2014 08:65:78 [name] ([id number]) -------------------------------------------------------------------------------- Wound Assessment Details Patient Name: [name], ASHE. Date of Service: 09/27/2014 9:00 AM Medical Record Number: [id number] Patient Account Number: [id number] Date of Birth/Sex: 1918-06-04 (81 y.o. Male) Treating RN: Montey Hora Primary Care Physician: Hortencia Pilar Other Clinician: Referring Physician: Hortencia Pilar Treating Physician/Extender: Frann Rider in Treatment: 21 Wound Status Wound Number: 4 Primary Malignant Wound Etiology: Wound Location: Left Malleolus - Medial Wound Status: Open Wounding Event: Other Lesion Comorbid Cataracts, Arrhythmia, Congestive Date Acquired: 09/20/2014 History: Heart Failure Weeks Of Treatment: 1 Clustered Wound: No Photos Photo Uploaded By: Montey Hora on 09/27/2014 13:37:45 Wound Measurements Length: (cm) 0.6 Width: (cm) 0.8 Depth: (cm) 0.2 Area: (cm) 0.377 Volume: (cm) 0.075 % Reduction in Area: -140.1% % Reduction in Volume: -368.7% Epithelialization: None Tunneling: No Undermining: No Wound Description Classification: Partial Thickness Foul Odor Afte Wound Margin: Flat and Intact Exudate Amount: Small Exudate Type: Serous Exudate Color: amber r Cleansing: No Wound Bed Granulation Amount: Small (1-33%) Exposed Structure Granulation Quality: Red, Pink Fascia Exposed: No Necrotic Amount: Large (67-100%) Fat Layer Exposed: No Necrotic Quality: Adherent Slough Tendon Exposed: No AKASHDEEP, CHUBA ([id number]) Muscle Exposed: No Joint Exposed: No Bone Exposed: No Limited to Skin  Breakdown Periwound Skin Texture Texture Color No Abnormalities Noted: No No Abnormalities Noted: No Callus: No Atrophie Blanche: No Crepitus: No Cyanosis: No Excoriation: No Ecchymosis: No Fluctuance: No Erythema: No Friable: No Hemosiderin Staining: No Induration: No Mottled: No Localized Edema: No Pallor: No Rash: No Rubor: No Scarring: No Temperature / Pain Moisture Temperature: No Abnormality No Abnormalities Noted: No Dry / Scaly: No Maceration: No Moist: No Wound Preparation Ulcer Cleansing: Rinsed/Irrigated with Saline Topical  Anesthetic Applied: Other: lidocaine 4%, Treatment Notes Wound #4 (Left, Medial Malleolus) 1. Cleansed with: Clean wound with Normal Saline 2. Anesthetic Topical Lidocaine 4% cream to wound bed prior to debridement Hurricaine Topical Anesthetic Spray 3. Peri-wound Care: Skin Prep 4. Dressing Applied: Aquacel Ag 5. Secondary Dressing Applied Bordered Foam Dressing Electronic Signature(s) Signed: 09/27/2014 9:29:25 AM By: Montey Hora Entered By: Montey Hora on 09/27/2014 32:44:01 [name] ([id number]) -------------------------------------------------------------------------------- [place] Details Patient Name: QUINNTON, BURY. Date of Service: 09/27/2014 9:00 AM Medical Record Number: [id number] Patient Account Number: [id number] Date of Birth/Sex: Jul 02, 1918 (81 y.o. Male) Treating RN: Montey Hora Primary Care Physician: Hortencia Pilar Other Clinician: Referring Physician: Hortencia Pilar Treating Physician/Extender: Frann Rider in Treatment: 21 Vital Signs Time Taken: 09:14 Temperature (F): 97.9 Height (in): 69 Pulse (bpm): 65 Weight (lbs): 179 Respiratory Rate (breaths/min): 18 Body Mass Index (BMI): 26.4 Blood Pressure (mmHg): 116/57 Reference Range: 80 - 120 mg / dl Electronic Signature(s) Signed: 09/27/2014 4:12:17 PM By: Montey Hora Entered By: Montey Hora on 09/27/2014 09:14:36

## 2014-09-28 NOTE — Progress Notes (Signed)
KERRI, ASCHE ([id number]) Visit Report for 09/27/2014 Chief Complaint Document Details Patient Name: Philip [name], Philip [name]. Date of Service: 09/27/2014 9:00 AM Medical Record Number: [id number] Patient Account Number: [id number] Date of Birth/Sex: 15-Oct-1918 (81 y.o. Male) Treating RN: Primary Care Physician: Hortencia Pilar Other Clinician: Referring Physician: Hortencia Pilar Treating Physician/Extender: Frann Rider in Treatment: 21 Information Obtained from: Patient Chief Complaint R foot ulcer. L forearm ulcer. 07/19/2014 -- about 2 weeks ago he had a surgical procedure done by dermatologist in [place] and has an open surgical wound on the dorsum of the right foot. Electronic Signature(s) Signed: 09/27/2014 12:20:52 PM By: Christin Fudge MD, FACS Entered By: Christin Fudge on 09/27/2014 09:45:13 Philip [name] ([id number]) -------------------------------------------------------------------------------- Debridement Details Patient Name: Philip [name], Philip [name]. Date of Service: 09/27/2014 9:00 AM Medical Record Number: [id number] Patient Account Number: [id number] Date of Birth/Sex: May 23, 1918 (81 y.o. Male) Treating RN: Primary Care Physician: Hortencia Pilar Other Clinician: Referring Physician: Hortencia Pilar Treating Physician/Extender: Frann Rider in Treatment: 21 Debridement Performed for Wound #3 Right,Dorsal Foot Assessment: Performed By: Physician Pat Patrick., MD Debridement: Debridement Pre-procedure Yes Verification/Time Out Taken: Start Time: 09:34 Pain Control: Other : lodocaine 4% Level: Skin/Subcutaneous Tissue Total Area Debrided (L x 1.2 (cm) x 1.3 (cm) = 1.56 (cm) W): Tissue and other Viable, Non-Viable, Exudate, Fibrin/Slough, Subcutaneous material debrided: Instrument: Curette Bleeding: Minimum Hemostasis Achieved: Pressure End Time: 09:36 Procedural Pain: 0 Post Procedural Pain: 0 Response to Treatment: Procedure was tolerated well Post  Debridement Measurements of Total Wound Length: (cm) 1.2 Width: (cm) 1.3 Depth: (cm) 0.4 Volume: (cm) 0.49 Electronic Signature(s) Signed: 09/27/2014 12:20:52 PM By: Christin Fudge MD, FACS Entered By: Christin Fudge on 09/27/2014 09:44:49 Philip [name] ([id number]) -------------------------------------------------------------------------------- Debridement Details Patient Name: Philip [name]. Date of Service: 09/27/2014 9:00 AM Medical Record Number: [id number] Patient Account Number: [id number] Date of Birth/Sex: 04-21-1918 (81 y.o. Male) Treating RN: Primary Care Physician: Hortencia Pilar Other Clinician: Referring Physician: Hortencia Pilar Treating Physician/Extender: Frann Rider in Treatment: 21 Debridement Performed for Wound #4 Left,Medial Malleolus Assessment: Performed By: Physician Pat Patrick., MD Debridement: Debridement Pre-procedure Yes Verification/Time Out Taken: Start Time: 09:34 Pain Control: Other : lodocaine 4% Level: Skin/Subcutaneous Tissue Total Area Debrided (L x 0.6 (cm) x 0.8 (cm) = 0.48 (cm) W): Tissue and other Viable, Non-Viable, Exudate, Subcutaneous material debrided: Instrument: Curette Bleeding: Minimum Hemostasis Achieved: Pressure End Time: 09:36 Procedural Pain: 0 Post Procedural Pain: 0 Response to Treatment: Procedure was tolerated well Post Debridement Measurements of Total Wound Length: (cm) 0.6 Width: (cm) 0.8 Depth: (cm) 0.3 Volume: (cm) 0.113 Electronic Signature(s) Signed: 09/27/2014 12:20:52 PM By: Christin Fudge MD, FACS Entered By: Christin Fudge on 09/27/2014 09:45:03 Philip [name] ([id number]) -------------------------------------------------------------------------------- HPI Details Patient Name: Philip [name]. Date of Service: 09/27/2014 9:00 AM Medical Record Number: [id number] Patient Account Number: [id number] Date of Birth/Sex: 1918-09-23 (81 y.o. Male) Treating RN: Primary Care Physician:  Hortencia Pilar Other Clinician: Referring Physician: Hortencia Pilar Treating Physician/Extender: Frann Rider in Treatment: 21 History of Present Illness Location: right leg Duration: Dec 2015 Modifying Factors: history of an injury to the right leg with resulting hematoma and thrombophlebitis and later an ulcer of posterior leg Associated Signs and Symptoms: marked lymphedema of the right leg. He is already on Eloquis. HPI Description: 06/14/14 -- He returns for followup today. He denies any fevers. no fresh issues and his daughter says he's been doing fine. 06/21/14 -- after he sustained a fall this week earlier he applied  a bandage over this himself and did not seek any medical attention. he did however manage to control the bleeding and had a dressing in place the next morning when his son to the visit. In this dressing was removed there was further damaged skin. His right leg has been doing fine otherwise. 07/12/14 --Very pleasant 81 year old with past medical history significant for congestive heart failure (EF 15%), peripheral vascular disease, and chronic kidney disease. He was hospitalized at [facility] in December 2015 for congestive heart failure. He says that he fell during his hospital course and developed a hematoma over his right calf. He was also diagnosed with a right lower extremity DVT for which he takes Eliquis. The hematoma subsequently turned into an ulceration around Christmas, which has healed. He subsequently developed an ulcer on his right dorsal foot and a traumatic left forearm ulcer. Per his report, he underwent biopsy of the right dorsal foot ulceration which demonstrated a skin cancer. His PCP and dermatologist office are both closed today. I reviewed his records in [place] but find no report of biopsy or pathology. He s without complaints today. No significant pain. No fever or chills. Minimal drainage. 07/19/2014 - the patient and his son tell me that about 2  weeks ago the dermatologist did a skin biopsy and this was a large area on the dorsum of his right foot which was left open and no dressing instructions were recommended. Since then he has been called and told that it is a cancer and the need to do a further procedure but that will not happen until about 2 weeks from now. In the meanwhile the patient has not been taking care of his right foot. The left forearm where he had an abrasion and laceration is doing very well. 07/26/2014 -- Reports from 07/08/2014 from the dermatology group reviewed. A excision was done of a lesion located on the dorsum of the right foot and this was 1.7 cm in diameter which was a shave biopsy performed. The wound was left open after appropriate cauterization and the patient was given this dressing instructions. 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 08/02/2014 -- all the original problems he came with have completely resolved. He now has a surgical wound on his right foot dorsum where a skin cancer was excised. He goes to see his dermatologist this Philip [name], COSENS ([id number]) coming Tuesday and will have definite news next Friday. 08/09/2014 he had gone to his dermatologist on Tuesday and she has injected the base of his ulcer with some chemotherapeutic agent. He was supposed to bring some papers with him but forgot to get them and will bring them in next week. Other than that the dermatologist had suggested using Mehdi honey on the wound. 08/16/2014 -- the patient has brought in his notes from the dermatologist and on 08/06/2014 he received a injection of 5 FU, 500 mg grams into the lesion. the pathology report was also sent and it was a squamous cell carcinoma well-differentiated and edges were involved. They wanted him to use many honey for the wound dressing changes to be done 3 times a week. 08/30/2014 -- he has  finished his second injection of 5-FU and has the next one in 2 weeks' time. He is doing fine otherwise. 09/13/2014 - No new complaints. No significant pain. No fever or chills. Minimal drainage. Still receiving 5-FU injections. 09/20/2014 -- He was seen by the dermatologist  on 09/10/2014 and Dr. Phillip Heal injected his foot both the right on the dorsum and left near the medial malleolus with 5-FU. The next dose of 5-FU is to be given after 3 months. The patient says he now has a spot on the left medial malleolus where he was injected with 5-FU. 09/27/2014 -- the area on the left ankle where he was injected with 5-FU is now a full-blown ulcer. He also has mild pain in both ankle areas. Electronic Signature(s) Signed: 09/27/2014 12:20:52 PM By: Christin Fudge MD, FACS Entered By: Christin Fudge on 09/27/2014 09:45:57 Philip [name] ([id number]) -------------------------------------------------------------------------------- Physical Exam Details Patient Name: Philip [name], Philip [name]. Date of Service: 09/27/2014 9:00 AM Medical Record Number: [id number] Patient Account Number: [id number] Date of Birth/Sex: May 22, 1918 (81 y.o. Male) Treating RN: Primary Care Physician: Hortencia Pilar Other Clinician: Referring Physician: Hortencia Pilar Treating Physician/Extender: Frann Rider in Treatment: 21 Constitutional . Pulse regular. Respirations normal and unlabored. Afebrile. . Eyes Nonicteric. Reactive to light. Ears, Nose, Mouth, and Throat Lips, teeth, and gums WNL.Marland Kitchen Moist mucosa without lesions . Neck supple and nontender. No palpable supraclavicular or cervical adenopathy. Normal sized without goiter. Respiratory WNL. No retractions.. Cardiovascular Pedal Pulses WNL. No clubbing, cyanosis or edema. Integumentary (Hair, Skin) the area on the left ankle where he was injected now is a full-blown ulcer. The ulcer on the dorsum of the right foot looks cleaner.. No crepitus or fluctuance. No  peri-wound warmth or erythema. No masses.Marland Kitchen Psychiatric Judgement and insight Intact.. No evidence of depression, anxiety, or agitation.. Electronic Signature(s) Signed: 09/27/2014 12:20:52 PM By: Christin Fudge MD, FACS Entered By: Christin Fudge on 09/27/2014 09:46:46 Philip [name] ([id number]) -------------------------------------------------------------------------------- Physician Orders Details Patient Name: Philip [name], Philip [name]. Date of Service: 09/27/2014 9:00 AM Medical Record Number: [id number] Patient Account Number: [id number] Date of Birth/Sex: 1918/08/20 (81 y.o. Male) Treating RN: Cornell Barman Primary Care Physician: Hortencia Pilar Other Clinician: Referring Physician: Hortencia Pilar Treating Physician/Extender: Frann Rider in Treatment: 46 Verbal / Phone Orders: Yes Clinician: Cornell Barman Read Back and Verified: Yes Diagnosis Coding Wound Cleansing Wound #3 Right,Dorsal Foot o Clean wound with Normal Saline. o May Shower, gently pat wound dry prior to applying new dressing. o May shower with protection. - DO NOT GET DRESSING WET. Wound #4 Left,Medial Malleolus o Clean wound with Normal Saline. o May Shower, gently pat wound dry prior to applying new dressing. o May shower with protection. - DO NOT GET DRESSING WET. Anesthetic Wound #3 Right,Dorsal Foot o Topical Lidocaine 4% cream applied to wound bed prior to debridement Wound #4 Left,Medial Malleolus o Topical Lidocaine 4% cream applied to wound bed prior to debridement Skin Barriers/Peri-Wound Care Wound #3 Right,Dorsal Foot o Barrier cream Wound #4 Left,Medial Malleolus o Barrier cream Primary Wound Dressing Wound #3 Right,Dorsal Foot o Prisma Ag Wound #4 Left,Medial Malleolus o Aquacel Ag Secondary Dressing Wound #3 Right,Dorsal Foot o Boardered Foam Dressing Philip [name], Philip [name]. ([id number]) Wound #4 Left,Medial Malleolus o Boardered Foam Dressing Dressing Change  Frequency Wound #3 Right,Dorsal Foot o Change dressing every other day. Wound #4 Left,Medial Malleolus o Change dressing every other day. Follow-up Appointments Wound #3 Right,Dorsal Foot o Return Appointment in 1 week. Wound #4 Left,Medial Malleolus o Return Appointment in 1 week. Edema Control Wound #3 Right,Dorsal Foot o Patient to wear own compression stockings Wound #4 Left,Medial Malleolus o Patient to wear own compression stockings Home Health Wound #3 [place] Visits - [place] Nurse may visit  PRN to address patientos wound care needs. o FACE TO FACE ENCOUNTER: MEDICARE and MEDICAID PATIENTS: I certify that this patient is under my care and that I had a face-to-face encounter that meets the physician face-to-face encounter requirements with this patient on this date. The encounter with the patient was in whole or in part for the following MEDICAL CONDITION: (primary reason for [place]) MEDICAL NECESSITY: I certify, that based on my findings, NURSING services are a medically necessary home health service. HOME BOUND STATUS: I certify that my clinical findings support that this patient is homebound (i.e., Due to illness or injury, pt requires aid of supportive devices such as crutches, cane, wheelchairs, walkers, the use of special transportation or the assistance of another person to leave their place of residence. There is a normal inability to leave the home and doing so requires considerable and taxing effort. Other absences are for medical reasons / religious services and are infrequent or of short duration when for other reasons). o If current dressing causes regression in wound condition, may D/C ordered dressing product/s and apply Normal Saline Moist Dressing daily until next [place] / Other MD appointment. [place] of regression in wound condition at  [phone]. o Please direct any NON-WOUND related issues/requests for orders to patient's Primary Care Physician Wound #4 [address] Philip [name], Philip [name] ([id number]) o [place] Nurse may visit PRN to address patientos wound care needs. o FACE TO FACE ENCOUNTER: MEDICARE and MEDICAID PATIENTS: I certify that this patient is under my care and that I had a face-to-face encounter that meets the physician face-to-face encounter requirements with this patient on this date. The encounter with the patient was in whole or in part for the following MEDICAL CONDITION: (primary reason for [place]) MEDICAL NECESSITY: I certify, that based on my findings, NURSING services are a medically necessary home health service. HOME BOUND STATUS: I certify that my clinical findings support that this patient is homebound (i.e., Due to illness or injury, pt requires aid of supportive devices such as crutches, cane, wheelchairs, walkers, the use of special transportation or the assistance of another person to leave their place of residence. There is a normal inability to leave the home and doing so requires considerable and taxing effort. Other absences are for medical reasons / religious services and are infrequent or of short duration when for other reasons). o If current dressing causes regression in wound condition, may D/C ordered dressing product/s and apply Normal Saline Moist Dressing daily until next [place] / Other MD appointment. [place] of regression in wound condition at [phone]. o Please direct any NON-WOUND related issues/requests for orders to patient's Primary Care Physician Electronic Signature(s) Signed: 09/27/2014 12:20:52 PM By: Christin Fudge MD, FACS Signed: 09/27/2014 4:53:24 PM By: Gretta Cool RN, BSN, Kim RN, BSN Entered By: Gretta Cool, RN, BSN, Kim on 09/27/2014 09:41:59 Philip [name]  ([id number]) -------------------------------------------------------------------------------- Problem List Details Patient Name: Philip [name], Philip [name]. Date of Service: 09/27/2014 9:00 AM Medical Record Number: [id number] Patient Account Number: [id number] Date of Birth/Sex: 1918-04-29 (81 y.o. Male) Treating RN: Primary Care Physician: Hortencia Pilar Other Clinician: Referring Physician: Hortencia Pilar Treating Physician/Extender: Frann Rider in Treatment: 21 Active Problems ICD-10 Encounter Code Description Active Date Diagnosis I70.232 Atherosclerosis of native arteries of right leg with 06/21/2014 Yes ulceration of calf I82.401 Acute embolism and thrombosis of unspecified deep veins 06/21/2014 Yes of right lower extremity S91.301A  Unspecified open wound, right foot, initial encounter 07/19/2014 Yes Z92.21 Personal history of antineoplastic chemotherapy 08/23/2014 Yes L97.522 Non-pressure chronic ulcer of other part of left foot with fat 09/20/2014 Yes layer exposed Inactive Problems Resolved Problems ICD-10 Code Description Active Date Resolved Date L97.212 Non-pressure chronic ulcer of right calf with fat layer 06/21/2014 06/21/2014 exposed S51.812A Laceration without foreign body of left forearm, initial 06/21/2014 06/21/2014 encounter JADER, DESAI ([id number]) Electronic Signature(s) Signed: 09/27/2014 12:20:52 PM By: Christin Fudge MD, FACS Entered By: Christin Fudge on 09/27/2014 09:44:31 Philip [name] ([id number]) -------------------------------------------------------------------------------- Progress Note Details Patient Name: Philip [name]. Date of Service: 09/27/2014 9:00 AM Medical Record Number: [id number] Patient Account Number: [id number] Date of Birth/Sex: May 19, 1918 (81 y.o. Male) Treating RN: Primary Care Physician: Hortencia Pilar Other Clinician: Referring Physician: Hortencia Pilar Treating Physician/Extender: Frann Rider in Treatment:  21 Subjective Chief Complaint Information obtained from Patient R foot ulcer. L forearm ulcer. 07/19/2014 -- about 2 weeks ago he had a surgical procedure done by dermatologist in [place] and has an open surgical wound on the dorsum of the right foot. History of Present Illness (HPI) The following HPI elements were documented for the patient's wound: Location: right leg Duration: Dec 2015 Modifying Factors: history of an injury to the right leg with resulting hematoma and thrombophlebitis and later an ulcer of posterior leg Associated Signs and Symptoms: marked lymphedema of the right leg. He is already on Eloquis. 06/14/14 -- He returns for followup today. He denies any fevers. no fresh issues and his daughter says he's been doing fine. 06/21/14 -- after he sustained a fall this week earlier he applied a bandage over this himself and did not seek any medical attention. he did however manage to control the bleeding and had a dressing in place the next morning when his son to the visit. In this dressing was removed there was further damaged skin. His right leg has been doing fine otherwise. 07/12/14 --Very pleasant 81 year old with past medical history significant for congestive heart failure (EF 15%), peripheral vascular disease, and chronic kidney disease. He was hospitalized at [facility] in December 2015 for congestive heart failure. He says that he fell during his hospital course and developed a hematoma over his right calf. He was also diagnosed with a right lower extremity DVT for which he takes Eliquis. The hematoma subsequently turned into an ulceration around Christmas, which has healed. He subsequently developed an ulcer on his right dorsal foot and a traumatic left forearm ulcer. Per his report, he underwent biopsy of the right dorsal foot ulceration which demonstrated a skin cancer. His PCP and dermatologist office are both closed today. I reviewed his records in [place] but find no report  of biopsy or pathology. He s without complaints today. No significant pain. No fever or chills. Minimal drainage. 07/19/2014 - the patient and his son tell me that about 2 weeks ago the dermatologist did a skin biopsy and this was a large area on the dorsum of his right foot which was left open and no dressing instructions were recommended. Since then he has been called and told that it is a cancer and the need to do a further procedure but that will not happen until about 2 weeks from now. In the meanwhile the patient has not been taking care of his right foot. The left forearm where he had an abrasion and laceration is doing very well. Philip [name], Philip [name] ([id number]) 07/26/2014 -- Reports from 07/08/2014 from the dermatology group reviewed.  A excision was done of a lesion located on the dorsum of the right foot and this was 1.7 cm in diameter which was a shave biopsy performed. The wound was left open after appropriate cauterization and the patient was given this dressing instructions. 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 08/02/2014 -- all the original problems he came with have completely resolved. He now has a surgical wound on his right foot dorsum where a skin cancer was excised. He goes to see his dermatologist this coming Tuesday and will have definite news next Friday. 08/09/2014 he had gone to his dermatologist on Tuesday and she has injected the base of his ulcer with some chemotherapeutic agent. He was supposed to bring some papers with him but forgot to get them and will bring them in next week. Other than that the dermatologist had suggested using Mehdi honey on the wound. 08/16/2014 -- the patient has brought in his notes from the dermatologist and on 08/06/2014 he received a injection of 5 FU, 500 mg grams into the lesion. the pathology report was also sent and it was a squamous cell  carcinoma well-differentiated and edges were involved. They wanted him to use many honey for the wound dressing changes to be done 3 times a week. 08/30/2014 -- he has finished his second injection of 5-FU and has the next one in 2 weeks' time. He is doing fine otherwise. 09/13/2014 - No new complaints. No significant pain. No fever or chills. Minimal drainage. Still receiving 5-FU injections. 09/20/2014 -- He was seen by the dermatologist on 09/10/2014 and Dr. Phillip Heal injected his foot both the right on the dorsum and left near the medial malleolus with 5-FU. The next dose of 5-FU is to be given after 3 months. The patient says he now has a spot on the left medial malleolus where he was injected with 5-FU. 09/27/2014 -- the area on the left ankle where he was injected with 5-FU is now a full-blown ulcer. He also has mild pain in both ankle areas. Objective Constitutional Pulse regular. Respirations normal and unlabored. Afebrile. Vitals Time Taken: 9:14 AM, Height: 69 in, Weight: 179 lbs, BMI: 26.4, Temperature: 97.9 F, Pulse: 65 bpm, Respiratory Rate: 18 breaths/min, Blood Pressure: 116/57 mmHg. Eyes Nonicteric. Reactive to light. JELANI, TRUEBA ([id number]) Ears, Nose, Mouth, and Throat Lips, teeth, and gums WNL.Marland Kitchen Moist mucosa without lesions . Neck supple and nontender. No palpable supraclavicular or cervical adenopathy. Normal sized without goiter. Respiratory WNL. No retractions.. Cardiovascular Pedal Pulses WNL. No clubbing, cyanosis or edema. Psychiatric Judgement and insight Intact.. No evidence of depression, anxiety, or agitation.. Integumentary (Hair, Skin) the area on the left ankle where he was injected now is a full-blown ulcer. The ulcer on the dorsum of the right foot looks cleaner.. No crepitus or fluctuance. No peri-wound warmth or erythema. No masses.. Wound #3 status is Open. Original cause of wound was Surgical Injury. The wound is located on the Right,Dorsal  Foot. The wound measures 1.2cm length x 1.3cm width x 0.3cm depth; 1.225cm^2 area and 0.368cm^3 volume. The wound is limited to skin breakdown. There is no tunneling or undermining noted. There is a small amount of serous drainage noted. The wound margin is distinct with the outline attached to the wound base. There is medium (34-66%) red granulation within the wound bed. There is a medium (34- 66%) amount of necrotic tissue within the wound bed including Adherent  Slough. The periwound skin appearance exhibited: Localized Edema, Moist, Erythema. The periwound skin appearance did not exhibit: Callus, Crepitus, Excoriation, Fluctuance, Friable, Induration, Rash, Scarring, Dry/Scaly, Maceration, Atrophie Blanche, Cyanosis, Ecchymosis, Hemosiderin Staining, Mottled, Pallor, Rubor. The surrounding wound skin color is noted with erythema which is circumferential. Erythema is measured at 3 cm. Periwound temperature was noted as No Abnormality. Wound #4 status is Open. Original cause of wound was Other Lesion. The wound is located on the Left,Medial Malleolus. The wound measures 0.6cm length x 0.8cm width x 0.2cm depth; 0.377cm^2 area and 0.075cm^3 volume. The wound is limited to skin breakdown. There is no tunneling or undermining noted. There is a small amount of serous drainage noted. The wound margin is flat and intact. There is small (1-33%) red, pink granulation within the wound bed. There is a large (67-100%) amount of necrotic tissue within the wound bed including Adherent Slough. The periwound skin appearance did not exhibit: Callus, Crepitus, Excoriation, Fluctuance, Friable, Induration, Localized Edema, Rash, Scarring, Dry/Scaly, Maceration, Moist, Atrophie Blanche, Cyanosis, Ecchymosis, Hemosiderin Staining, Mottled, Pallor, Rubor, Erythema. Periwound temperature was noted as No Abnormality. 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 He will continue to see as regularly except for the Fourth of July weekend where he goes off to the beach. 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 There was a Skin/Subcutaneous Tissue Debridement (37628-31517) debridement with total area of 1.56 sq cm performed by Damia Bobrowski, Jackson Latino., MD. with the following instrument(s): Curette to remove Viable and Non-Viable tissue/material including Exudate, Fibrin/Slough, and Subcutaneous after achieving pain control using Other (lodocaine 4%). A time out was conducted prior to the start of the procedure. A Minimum amount of bleeding was controlled with Pressure. The procedure was tolerated well with a pain level of 0 throughout and a pain level of 0 following the procedure. Post Debridement Measurements: 1.2cm length x 1.3cm width x 0.4cm depth; 0.49cm^3 volume. Wound #4 Wound #4 is a Malignant Wound located on the Left,Medial Malleolus . There was a Skin/Subcutaneous Tissue Debridement ([id number]) debridement with total area of 0.48 sq cm performed by Rasean Joos, Jackson Latino., MD. with the following instrument(s): Curette to remove Viable and Non-Viable tissue/material including Exudate and Subcutaneous after achieving pain control using Other (lodocaine 4%). A time out was conducted prior to the start of the procedure. A Minimum amount of bleeding was controlled with Pressure. The procedure was tolerated well with a pain level of 0 throughout and a pain level of 0 following  the procedure. Post Debridement Measurements: 0.6cm length x 0.8cm width x 0.3cm depth; 0.113cm^3 volume. Plan Wound Cleansing: Wound #3 Right,Dorsal Foot: Clean wound with Normal Saline. Philip [name], Philip [name] ([id number]) May Shower, gently pat wound dry prior to applying new dressing. May shower with protection. - DO NOT GET DRESSING WET. Wound #4 Left,Medial Malleolus: Clean wound with Normal Saline. May Shower, gently pat wound dry prior to applying new dressing. May shower with protection. - DO NOT GET DRESSING WET. Anesthetic: Wound #3 Right,Dorsal  Foot: Topical Lidocaine 4% cream applied to wound bed prior to debridement Wound #4 Left,Medial Malleolus: Topical Lidocaine 4% cream applied to wound bed prior to debridement Skin Barriers/Peri-Wound Care: Wound #3 Right,Dorsal Foot: Barrier cream Wound #4 Left,Medial Malleolus: Barrier cream Primary Wound Dressing: Wound #3 Right,Dorsal Foot: Prisma Ag Wound #4 Left,Medial Malleolus: Aquacel Ag Secondary Dressing: Wound #3 Right,Dorsal Foot: Boardered Foam Dressing Wound #4 Left,Medial Malleolus: Boardered Foam Dressing Dressing Change Frequency: Wound #3 Right,Dorsal Foot: Change dressing every other day. Wound #4 Left,Medial Malleolus: Change dressing every other day. Follow-up Appointments: Wound #3 Right,Dorsal Foot: Return Appointment in 1 week. Wound #4 Left,Medial Malleolus: Return Appointment in 1 week. Edema Control: Wound #3 Right,Dorsal Foot: Patient to wear own compression stockings Wound #4 Left,Medial Malleolus: Patient to wear own compression stockings Home Health: Wound #3 Right,Dorsal Foot: [place] Visits - [place] Nurse may visit PRN to address patient s wound care needs. FACE TO FACE ENCOUNTER: MEDICARE and MEDICAID PATIENTS: I certify that this patient is under my care and that I had a face-to-face encounter that meets the physician face-to-face encounter requirements  with this patient on this date. The encounter with the patient was in whole or in part for the following MEDICAL CONDITION: (primary reason for [place]) MEDICAL NECESSITY: I certify, that based on my findings, NURSING services are a medically necessary home health service. HOME BOUND STATUS: I certify that my clinical findings support that this patient is homebound (i.e., Due to MARKE, GOODWYN. ([id number]) illness or injury, pt requires aid of supportive devices such as crutches, cane, wheelchairs, walkers, the use of special transportation or the assistance of another person to leave their place of residence. There is a normal inability to leave the home and doing so requires considerable and taxing effort. Other absences are for medical reasons / religious services and are infrequent or of short duration when for other reasons). If current dressing causes regression in wound condition, may D/C ordered dressing product/s and apply Normal Saline Moist Dressing daily until next [place] / Other MD appointment. [place] of regression in wound condition at [phone]. Please direct any NON-WOUND related issues/requests for orders to patient's Primary Care Physician Wound #4 Left,Medial Malleolus: [place] Visits - [place] Nurse may visit PRN to address patient s wound care needs. FACE TO FACE ENCOUNTER: MEDICARE and MEDICAID PATIENTS: I certify that this patient is under my care and that I had a face-to-face encounter that meets the physician face-to-face encounter requirements with this patient on this date. The encounter with the patient was in whole or in part for the following MEDICAL CONDITION: (primary reason for [place]) MEDICAL NECESSITY: I certify, that based on my findings, NURSING services are a medically necessary home health service. HOME BOUND STATUS: I certify that my clinical findings support that this  patient is homebound (i.e., Due to illness or injury, pt requires aid of supportive devices such as crutches, cane, wheelchairs, walkers, the use of special transportation or the assistance of another person to leave their place of residence. There is a normal inability to leave the home and doing so requires considerable and taxing effort. Other absences are for medical reasons / religious services and are infrequent or of short duration when for other reasons). If current dressing causes regression in wound condition, may D/C ordered dressing product/s and apply Normal Saline Moist Dressing daily until next [place] / Other MD appointment. [facility]ify [facility] of regression in wound condition at [phone]. Please direct any NON-WOUND related issues/requests for orders to patient's Primary Care Physician We will continue to use silver alginate on the left ankle and prisma AG on the right dorsum of the foot. He will continue to see as regularly except for the Fourth of July weekend where he goes off to the beach. His daughter is at the bedside today and she has got all the facts straight. Electronic Signature(s) Signed: 09/27/2014 12:20:52 PM By: Christin Fudge MD, FACS Entered By: Christin Fudge on 09/27/2014 09:48:01 Philip [name] ([id number]) -------------------------------------------------------------------------------- SuperBill Details Patient Name: MUSAB, WINGARD. Date of Service: 09/27/2014 Medical Record Number: [id number] Patient Account Number: [id number] Date of Birth/Sex: 1918/09/20 (81 y.o. 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 Facility Procedures The patient participates with Medicare or their insurance follows the Medicare Facility Guidelines: CPT4 Description Modifier Quantity Code [id number] 11042 - DEB SUBQ TISSUE 20 SQ CM/< 1 ICD-10 Description Diagnosis I70.232 Atherosclerosis of native  arteries of right leg with ulceration of calf I82.401 Acute embolism and thrombosis of unspecified deep veins of right lower extremity Z92.21 Personal history of antineoplastic chemotherapy Physician Procedures CPT4: Description Modifier Quantity Code [id number] 11042 - WC PHYS SUBQ TISS 20 SQ CM 1 ICD-10 Description Diagnosis I70.232 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 I82.401 Acute embolism and thrombosis of unspecified deep  veins of right lower extremity Z92.21 Personal history of antineoplastic chemotherapy Electronic Signature(s) Signed: 09/27/2014 12:20:52 PM By: Christin Fudge MD, FACS LIO, WEHRLY ([id number]) Entered By: Christin Fudge on 09/27/2014 09:48:16

## 2014-10-01 ENCOUNTER — Other Ambulatory Visit: Payer: Self-pay | Admitting: Family Medicine

## 2014-10-01 DIAGNOSIS — I82411 Acute embolism and thrombosis of right femoral vein: Secondary | ICD-10-CM

## 2014-10-04 ENCOUNTER — Encounter: Payer: Medicare Other | Admitting: Surgery

## 2014-10-04 DIAGNOSIS — S91301A Unspecified open wound, right foot, initial encounter: Secondary | ICD-10-CM | POA: Diagnosis not present

## 2014-10-07 ENCOUNTER — Ambulatory Visit
Admission: RE | Admit: 2014-10-07 | Discharge: 2014-10-07 | Disposition: A | Discharge status: Home / Self Care | Payer: Medicare Other | Source: Ambulatory Visit | Attending: Family Medicine | Admitting: Family Medicine

## 2014-10-07 DIAGNOSIS — I82411 Acute embolism and thrombosis of right femoral vein: Secondary | ICD-10-CM | POA: Diagnosis not present

## 2014-10-11 ENCOUNTER — Encounter: Payer: Medicare Other | Admitting: Surgery

## 2014-10-11 DIAGNOSIS — S91301A Unspecified open wound, right foot, initial encounter: Secondary | ICD-10-CM | POA: Diagnosis not present

## 2014-10-11 NOTE — Progress Notes (Addendum)
[name], [name] ([id number]) Visit Report for 10/11/2014 Chief Complaint Document Details Patient Name: Philip [name], Philip [name]. Date of Service: 10/11/2014 9:00 AM Medical Record Number: [id number] Patient Account Number: [id number] Date of Birth/Sex: December 29, 1918 (81 y.o. Male) Treating RN: Primary Care Physician: Hortencia Pilar Other Clinician: Referring Physician: Hortencia Pilar Treating Physician/Extender: Frann Rider in Treatment: 55 Information Obtained from: Patient Chief Complaint R foot ulcer. L forearm ulcer. 07/19/2014 -- about 2 weeks ago he had a surgical procedure done by dermatologist in [place] and has an open surgical wound on the dorsum of the right foot. Electronic Signature(s) Signed: 10/11/2014 10:16:48 AM By: Christin Fudge MD, FACS Entered By: Christin Fudge on 10/11/2014 09:33:10 Philip [name] ([id number]) -------------------------------------------------------------------------------- Debridement Details Patient Name: Philip [name]. Date of Service: 10/11/2014 9:00 AM Medical Record Number: [id number] Patient Account Number: [id number] Date of Birth/Sex: 1918/09/27 (81 y.o. Male) Treating RN: Primary Care Physician: Hortencia Pilar Other Clinician: Referring Physician: Hortencia Pilar Treating Physician/Extender: Frann Rider in Treatment: 23 Debridement Performed for Wound #4 Left,Medial Malleolus Assessment: Performed By: Physician Pat Patrick., MD Debridement: Open Wound/Selective Debridement Selective Description: Pre-procedure Yes Verification/Time Out Taken: Start Time: 09:28 Pain Control: Lidocaine 4% Topical Solution Level: Non-Viable Tissue Total Area Debrided (L x 1 (cm) x 1.2 (cm) = 1.2 (cm) W): Tissue and other Non-Viable, Exudate, Fibrin/Slough, Subcutaneous material debrided: Instrument: Forceps Bleeding: Minimum Hemostasis Achieved: Pressure End Time: 09:28 Procedural Pain: 0 Post Procedural Pain: 0 Response to  Treatment: Procedure was tolerated well Post Debridement Measurements of Total Wound Length: (cm) 1 Width: (cm) 1.2 Depth: (cm) 0.3 Volume: (cm) 0.283 Electronic Signature(s) Signed: 10/11/2014 10:16:48 AM By: Christin Fudge MD, FACS Entered By: Christin Fudge on 10/11/2014 09:33:03 Philip [name] ([id number]) -------------------------------------------------------------------------------- Debridement Details Patient Name: Philip [name]. Date of Service: 10/11/2014 9:00 AM Medical Record Number: [id number] Patient Account Number: [id number] Date of Birth/Sex: 1918/11/29 (81 y.o. Male) Treating RN: Afful, RN, BSN, [place] Primary Care Physician: Hortencia Pilar Other Clinician: Referring Physician: Hortencia Pilar Treating Physician/Extender: Frann Rider in Treatment: 23 Debridement Performed for Wound #3 Right,Dorsal Foot Assessment: Performed By: Physician Pat Patrick., MD Debridement: Open Wound/Selective Debridement Selective Description: Pre-procedure Yes Verification/Time Out Taken: Start Time: 09:29 Pain Control: Lidocaine 4% Topical Solution Level: Non-Viable Tissue Total Area Debrided (L x 1.3 (cm) x 1.3 (cm) = 1.69 (cm) W): Tissue and other Non-Viable, Exudate, Fibrin/Slough, Subcutaneous material debrided: Instrument: Forceps Bleeding: Minimum Hemostasis Achieved: Pressure End Time: 09:29 Procedural Pain: 0 Post Procedural Pain: 0 Response to Treatment: Procedure was tolerated well Post Debridement Measurements of Total Wound Length: (cm) 1.3 Width: (cm) 1.3 Depth: (cm) 0.3 Volume: (cm) 0.398 Electronic Signature(s) Signed: 10/11/2014 10:16:48 AM By: Christin Fudge MD, FACS Signed: 10/11/2014 2:01:00 PM By: Regan Lemming BSN, RN Entered By: Regan Lemming on 10/11/2014 09:49:00 Philip [name] ([id number]) -------------------------------------------------------------------------------- HPI Details Patient Name: Philip [name], Philip [name]. Date of Service:  10/11/2014 9:00 AM Medical Record Number: [id number] Patient Account Number: [id number] Date of Birth/Sex: 13-Feb-1919 (81 y.o. Male) Treating RN: Primary Care Physician: Hortencia Pilar Other Clinician: Referring Physician: Hortencia Pilar Treating Physician/Extender: Frann Rider in Treatment: 23 History of Present Illness Location: right leg Duration: Dec 2015 Modifying Factors: history of an injury to the right leg with resulting hematoma and thrombophlebitis and later an ulcer of posterior leg Associated Signs and Symptoms: marked lymphedema of the right leg. He is already on Eloquis. HPI Description: 06/14/14 -- He returns for followup today. He denies any fevers. no fresh issues  and his daughter says he's been doing fine. 06/21/14 -- after he sustained a fall this week earlier he applied a bandage over this himself and did not seek any medical attention. he did however manage to control the bleeding and had a dressing in place the next morning when his son to the visit. In this dressing was removed there was further damaged skin. His right leg has been doing fine otherwise. 07/12/14 --Very pleasant 81 year old with past medical history significant for congestive heart failure (EF 15%), peripheral vascular disease, and chronic kidney disease. He was hospitalized at [facility] in December 2015 for congestive heart failure. He says that he fell during his hospital course and developed a hematoma over his right calf. He was also diagnosed with a right lower extremity DVT for which he takes Eliquis. The hematoma subsequently turned into an ulceration around Christmas, which has healed. He subsequently developed an ulcer on his right dorsal foot and a traumatic left forearm ulcer. Per his report, he underwent biopsy of the right dorsal foot ulceration which demonstrated a skin cancer. His PCP and dermatologist office are both closed today. I reviewed his records in [place] but find no report  of biopsy or pathology. He s without complaints today. No significant pain. No fever or chills. Minimal drainage. 07/19/2014 - the patient and his son tell me that about 2 weeks ago the dermatologist did a skin biopsy and this was a large area on the dorsum of his right foot which was left open and no dressing instructions were recommended. Since then he has been called and told that it is a cancer and the need to do a further procedure but that will not happen until about 2 weeks from now. In the meanwhile the patient has not been taking care of his right foot. The left forearm where he had an abrasion and laceration is doing very well. 07/26/2014 -- Reports from 07/08/2014 from the dermatology group reviewed. A excision was done of a lesion located on the dorsum of the right foot and this was 1.7 cm in diameter which was a shave biopsy performed. The wound was left open after appropriate cauterization and the patient was given this dressing instructions. 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 08/02/2014 -- all the original problems he came with have completely resolved. He now has a surgical wound on his right foot dorsum where a skin cancer was excised. He goes to see his dermatologist this Philip [name], Philip [name] ([id number]) coming Tuesday and will have definite news next Friday. 08/09/2014 he had gone to his dermatologist on Tuesday and she has injected the base of his ulcer with some chemotherapeutic agent. He was supposed to bring some papers with him but forgot to get them and will bring them in next week. Other than that the dermatologist had suggested using Mehdi honey on the wound. 08/16/2014 -- the patient has brought in his notes from the dermatologist and on 08/06/2014 he received a injection of 5 FU, 500 mg grams into the lesion. the pathology report was also sent and it was a squamous cell  carcinoma well-differentiated and edges were involved. They wanted him to use many honey for the wound dressing changes to be done 3 times a week. 08/30/2014 -- he has finished his second injection of 5-FU and has the next one in 2 weeks' time. He is doing fine otherwise. 09/13/2014 - No new complaints. No  significant pain. No fever or chills. Minimal drainage. Still receiving 5-FU injections. 09/20/2014 -- He was seen by the dermatologist on 09/10/2014 and Dr. Phillip Heal injected his foot both the right on the dorsum and left near the medial malleolus with 5-FU. The next dose of 5-FU is to be given after 3 months. The patient says he now has a spot on the left medial malleolus where he was injected with 5-FU. 09/27/2014 -- the area on the left ankle where he was injected with 5-FU is now a full-blown ulcer. He also has mild pain in both ankle areas. 10/04/2014 - large had a bit of a fall and injured his right arm last evening and has had a laceration with no evidence of any foreign body in the right forearm. Electronic Signature(s) Signed: 10/11/2014 10:16:48 AM By: Christin Fudge MD, FACS Entered By: Christin Fudge on 10/11/2014 09:33:14 Philip [name] ([id number]) -------------------------------------------------------------------------------- Physical Exam Details Patient Name: JARRYD, Philip [name]. Date of Service: 10/11/2014 9:00 AM Medical Record Number: [id number] Patient Account Number: [id number] Date of Birth/Sex: 01/05/1919 (81 y.o. Male) Treating RN: Primary Care Physician: Hortencia Pilar Other Clinician: Referring Physician: Hortencia Pilar Treating Physician/Extender: Frann Rider in Treatment: 23 Constitutional . Pulse regular. Respirations normal and unlabored. Afebrile. . Eyes Nonicteric. Reactive to light. Ears, Nose, Mouth, and Throat Lips, teeth, and gums WNL.Marland Kitchen Moist mucosa without lesions . Neck supple and nontender. No palpable supraclavicular or cervical  adenopathy. Normal sized without goiter. Respiratory WNL. No retractions.. Cardiovascular Pedal Pulses WNL. No clubbing, cyanosis or edema. Integumentary (Hair, Skin) the right forearm has got fairly healthy granulation tissue in the V-shaped skin is intact. Not much change as far as the slough on the left ankle and the right dorsum of the foot.Marland Kitchen No crepitus or fluctuance. No peri- wound warmth or erythema. No masses.Marland Kitchen Psychiatric Judgement and insight Intact.. No evidence of depression, anxiety, or agitation.. Notes the right forearm has got fairly healthy granulation tissue in the V-shaped skin is intact. Not much change as far as the slough on the left ankle and the right dorsum of the foot. Electronic Signature(s) Signed: 10/11/2014 10:16:48 AM By: Christin Fudge MD, FACS Entered By: Christin Fudge on 10/11/2014 09:36:32 Philip [name] ([id number]) -------------------------------------------------------------------------------- Physician Orders Details Patient Name: Philip [name], Philip [name]. Date of Service: 10/11/2014 9:00 AM Medical Record Number: [id number] Patient Account Number: [id number] Date of Birth/Sex: May 10, 1918 (81 y.o. Male) Treating RN: Baruch Gouty, RN, BSN, Velva Harman Primary Care Physician: Hortencia Pilar Other Clinician: Referring Physician: Hortencia Pilar Treating Physician/Extender: Frann Rider in Treatment: 37 Verbal / Phone Orders: Yes Clinician: Afful, RN, BSN, Rita Read Back and Verified: Yes Diagnosis Coding Wound Cleansing Wound #3 Right,Dorsal Foot o Cleanse wound with mild soap and water o May Shower, gently pat wound dry prior to applying new dressing. o May shower with protection. Wound #4 Left,Medial Malleolus o Cleanse wound with mild soap and water o May Shower, gently pat wound dry prior to applying new dressing. o May shower with protection. Skin Barriers/Peri-Wound Care Wound #3 Right,Dorsal Foot o Skin Prep Wound #4 Left,Medial  Malleolus o Skin Prep Wound #5 Right,Lateral Forearm o Skin Prep Primary Wound Dressing Wound #3 Right,Dorsal Foot o Aquacel Ag Wound #4 Left,Medial Malleolus o Aquacel Ag Wound #5 Right,Lateral Forearm o Prisma Ag o Other: - mepitel Secondary Dressing Wound #3 Right,Dorsal Foot o Boardered Foam Dressing Wound #4 Left,Medial Malleolus Philip [name], Philip [name]. ([id number]) o Boardered Foam Dressing Wound #5 Right,Lateral Forearm o Boardered Foam Dressing Dressing Change Frequency  Wound #3 Right,Dorsal Foot o Change dressing every other day. Wound #4 Left,Medial Malleolus o Change dressing every other day. Wound #5 Right,Lateral Forearm o Change dressing every other day. Follow-up Appointments Wound #3 Right,Dorsal Foot o Return Appointment in 1 week. Wound #4 Left,Medial Malleolus o Return Appointment in 1 week. Wound #5 Right,Lateral Forearm o Return Appointment in 1 week. Home Health Wound #3 [place] Visits - [place] Nurse may visit PRN to address patientos wound care needs. o FACE TO FACE ENCOUNTER: MEDICARE and MEDICAID PATIENTS: I certify that this patient is under my care and that I had a face-to-face encounter that meets the physician face-to-face encounter requirements with this patient on this date. The encounter with the patient was in whole or in part for the following MEDICAL CONDITION: (primary reason for [place]) MEDICAL NECESSITY: I certify, that based on my findings, NURSING services are a medically necessary home health service. HOME BOUND STATUS: I certify that my clinical findings support that this patient is homebound (i.e., Due to illness or injury, pt requires aid of supportive devices such as crutches, cane, wheelchairs, walkers, the use of special transportation or the assistance of another person to leave their place of residence. There is a normal  inability to leave the home and doing so requires considerable and taxing effort. Other absences are for medical reasons / religious services and are infrequent or of short duration when for other reasons). o If current dressing causes regression in wound condition, may D/C ordered dressing product/s and apply Normal Saline Moist Dressing daily until next [place] / Other MD appointment. [place] of regression in wound condition at [phone]. o Please direct any NON-WOUND related issues/requests for orders to patient's Primary Care Physician Wound #4 [id number] Philip [name], Philip [name] ([id number]) o [place] Nurse may visit PRN to address patientos wound care needs. o FACE TO FACE ENCOUNTER: MEDICARE and MEDICAID PATIENTS: I certify that this patient is under my care and that I had a face-to-face encounter that meets the physician face-to-face encounter requirements with this patient on this date. The encounter with the patient was in whole or in part for the following MEDICAL CONDITION: (primary reason for [place]) MEDICAL NECESSITY: I certify, that based on my findings, NURSING services are a medically necessary home health service. HOME BOUND STATUS: I certify that my clinical findings support that this patient is homebound (i.e., Due to illness or injury, pt requires aid of supportive devices such as crutches, cane, wheelchairs, walkers, the use of special transportation or the assistance of another person to leave their place of residence. There is a normal inability to leave the home and doing so requires considerable and taxing effort. Other absences are for medical reasons / religious services and are infrequent or of short duration when for other reasons). o If current dressing causes regression in wound condition, may D/C ordered dressing product/s and apply Normal  Saline Moist Dressing daily until next [place] / Other MD appointment. [place] of regression in wound condition at [phone]. o Please direct any NON-WOUND related issues/requests for orders to patient's Primary Care Physician Wound #5 Right,Lateral Forearm o [place] Visits - [place] Nurse may visit PRN to address patientos wound care needs. o FACE TO FACE ENCOUNTER: MEDICARE and MEDICAID PATIENTS: I certify that this patient  is under my care and that I had a face-to-face encounter that meets the physician face-to-face encounter requirements with this patient on this date. The encounter with the patient was in whole or in part for the following MEDICAL CONDITION: (primary reason for [place]) MEDICAL NECESSITY: I certify, that based on my findings, NURSING services are a medically necessary home health service. HOME BOUND STATUS: I certify that my clinical findings support that this patient is homebound (i.e., Due to illness or injury, pt requires aid of supportive devices such as crutches, cane, wheelchairs, walkers, the use of special transportation or the assistance of another person to leave their place of residence. There is a normal inability to leave the home and doing so requires considerable and taxing effort. Other absences are for medical reasons / religious services and are infrequent or of short duration when for other reasons). o If current dressing causes regression in wound condition, may D/C ordered dressing product/s and apply Normal Saline Moist Dressing daily until next [place] / Other MD appointment. [place] of regression in wound condition at [phone]. o Please direct any NON-WOUND related issues/requests for orders to patient's Primary Care Physician Electronic Signature(s) Signed: 10/11/2014 10:16:48 AM By: Christin Fudge MD, FACS Signed:  10/11/2014 2:01:00 PM By: Regan Lemming BSN, RN Entered By: Regan Lemming on 10/11/2014 09:27:56 Philip [name] ([id number]) -------------------------------------------------------------------------------- Problem List Details Patient Name: GIO, JANOSKI. Date of Service: 10/11/2014 9:00 AM Medical Record Number: [id number] Patient Account Number: [id number] Date of Birth/Sex: [birth year] (81 y.o. Male) Treating RN: Primary Care Physician: Hortencia Pilar Other Clinician: Referring Physician: Hortencia Pilar Treating Physician/Extender: Frann Rider in Treatment: 23 Active Problems ICD-10 Encounter Code Description Active Date Diagnosis I70.232 Atherosclerosis of native arteries of right leg with 06/21/2014 Yes ulceration of calf I82.401 Acute embolism and thrombosis of unspecified deep veins 06/21/2014 Yes of right lower extremity S91.301A Unspecified open wound, right foot, initial encounter 07/19/2014 Yes Z92.21 Personal history of antineoplastic chemotherapy 08/23/2014 Yes L97.522 Non-pressure chronic ulcer of other part of left foot with fat 09/20/2014 Yes layer exposed S41.111A Laceration without foreign body of right upper arm, initial 10/04/2014 Yes encounter Inactive Problems Resolved Problems ICD-10 Code Description Active Date Resolved Date L97.212 Non-pressure chronic ulcer of right calf with fat layer 06/21/2014 06/21/2014 exposed S51.812A Laceration without foreign body of left forearm, initial 06/21/2014 06/21/2014 encounter Philip [name], Philip [name] ([id number]) Electronic Signature(s) Signed: 10/11/2014 10:16:48 AM By: Christin Fudge MD, FACS Entered By: Christin Fudge on 10/11/2014 09:32:50 Philip [name] ([id number]) -------------------------------------------------------------------------------- Progress Note Details Patient Name: Philip [name]. Date of Service: 10/11/2014 9:00 AM Medical Record Number: [id number] Patient Account Number: [id number] Date of Birth/Sex: [birth year] (81  y.o. Male) Treating RN: Primary Care Physician: Hortencia Pilar Other Clinician: Referring Physician: Hortencia Pilar Treating Physician/Extender: Frann Rider in Treatment: 34 Subjective Chief Complaint Information obtained from Patient R foot ulcer. L forearm ulcer. 07/19/2014 -- about 2 weeks ago he had a surgical procedure done by dermatologist in [place] and has an open surgical wound on the dorsum of the right foot. History of Present Illness (HPI) The following HPI elements were documented for the patient's wound: Location: right leg Duration: Dec 2015 Modifying Factors: history of an injury to the right leg with resulting hematoma and thrombophlebitis and later an ulcer of posterior leg Associated Signs and Symptoms: marked lymphedema of the right leg. He is already on Eloquis. 06/14/14 -- He returns for followup today. He denies any fevers. no fresh  issues and his daughter says he's been doing fine. 06/21/14 -- after he sustained a fall this week earlier he applied a bandage over this himself and did not seek any medical attention. he did however manage to control the bleeding and had a dressing in place the next morning when his son to the visit. In this dressing was removed there was further damaged skin. His right leg has been doing fine otherwise. 07/12/14 --Very pleasant 81 year old with past medical history significant for congestive heart failure (EF 15%), peripheral vascular disease, and chronic kidney disease. He was hospitalized at [facility] in December 2015 for congestive heart failure. He says that he fell during his hospital course and developed a hematoma over his right calf. He was also diagnosed with a right lower extremity DVT for which he takes Eliquis. The hematoma subsequently turned into an ulceration around Christmas, which has healed. He subsequently developed an ulcer on his right dorsal foot and a traumatic left forearm ulcer. Per his report, he underwent  biopsy of the right dorsal foot ulceration which demonstrated a skin cancer. His PCP and dermatologist office are both closed today. I reviewed his records in [place] but find no report of biopsy or pathology. He s without complaints today. No significant pain. No fever or chills. Minimal drainage. 07/19/2014 - the patient and his son tell me that about 2 weeks ago the dermatologist did a skin biopsy and this was a large area on the dorsum of his right foot which was left open and no dressing instructions were recommended. Since then he has been called and told that it is a cancer and the need to do a further procedure but that will not happen until about 2 weeks from now. In the meanwhile the patient has not been taking care of his right foot. The left forearm where he had an abrasion and laceration is doing very well. JERMELL, HOLEMAN ([id number]) 07/26/2014 -- Reports from 07/08/2014 from the dermatology group reviewed. A excision was done of a lesion located on the dorsum of the right foot and this was 1.7 cm in diameter which was a shave biopsy performed. The wound was left open after appropriate cauterization and the patient was given this dressing instructions. 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 08/02/2014 -- all the original problems he came with have completely resolved. He now has a surgical wound on his right foot dorsum where a skin cancer was excised. He goes to see his dermatologist this coming Tuesday and will have definite news next Friday. 08/09/2014 he had gone to his dermatologist on Tuesday and she has injected the base of his ulcer with some chemotherapeutic agent. He was supposed to bring some papers with him but forgot to get them and will bring them in next week. Other than that the dermatologist had suggested using Mehdi honey on the wound. 08/16/2014 -- the patient has  brought in his notes from the dermatologist and on 08/06/2014 he received a injection of 5 FU, 500 mg grams into the lesion. the pathology report was also sent and it was a squamous cell carcinoma well-differentiated and edges were involved. They wanted him to use many honey for the wound dressing changes to be done 3 times a week. 08/30/2014 -- he has finished his second injection of 5-FU and has the next one in 2 weeks' time. He is doing fine otherwise. 09/13/2014 - No new complaints.  No significant pain. No fever or chills. Minimal drainage. Still receiving 5-FU injections. 09/20/2014 -- He was seen by the dermatologist on 09/10/2014 and Dr. Phillip Heal injected his foot both the right on the dorsum and left near the medial malleolus with 5-FU. The next dose of 5-FU is to be given after 3 months. The patient says he now has a spot on the left medial malleolus where he was injected with 5-FU. 09/27/2014 -- the area on the left ankle where he was injected with 5-FU is now a full-blown ulcer. He also has mild pain in both ankle areas. 10/04/2014 - large had a bit of a fall and injured his right arm last evening and has had a laceration with no evidence of any foreign body in the right forearm. Objective Constitutional Pulse regular. Respirations normal and unlabored. Afebrile. Vitals Time Taken: 9:08 AM, Height: 69 in, Weight: 179 lbs, BMI: 26.4, Temperature: 97.7 F, Pulse: 75 bpm, Respiratory Rate: 16 breaths/min, Blood Pressure: 138/60 mmHg. Philip [name], WILLEMSEN ([id number]) Eyes Nonicteric. Reactive to light. Ears, Nose, Mouth, and Throat Lips, teeth, and gums WNL.Marland Kitchen Moist mucosa without lesions . Neck supple and nontender. No palpable supraclavicular or cervical adenopathy. Normal sized without goiter. Respiratory WNL. No retractions.. Cardiovascular Pedal Pulses WNL. No clubbing, cyanosis or edema. Psychiatric Judgement and insight Intact.. No evidence of depression, anxiety, or  agitation.. General Notes: the right forearm has got fairly healthy granulation tissue in the V-shaped skin is intact. Not much change as far as the slough on the left ankle and the right dorsum of the foot. Integumentary (Hair, Skin) the right forearm has got fairly healthy granulation tissue in the V-shaped skin is intact. Not much change as far as the slough on the left ankle and the right dorsum of the foot.Marland Kitchen No crepitus or fluctuance. No peri- wound warmth or erythema. No masses.. Wound #3 status is Open. Original cause of wound was Surgical Injury. The wound is located on the Right,Dorsal Foot. The wound measures 1.3cm length x 1.3cm width x 0.3cm depth; 1.327cm^2 area and 0.398cm^3 volume. The wound is limited to skin breakdown. There is no tunneling or undermining noted. There is a medium amount of serosanguineous drainage noted. The wound margin is distinct with the outline attached to the wound base. There is small (1-33%) pink granulation within the wound bed. There is a medium (34-66%) amount of necrotic tissue within the wound bed including Adherent Slough. The periwound skin appearance exhibited: Localized Edema, Moist. The periwound skin appearance did not exhibit: Callus, Crepitus, Excoriation, Fluctuance, Friable, Induration, Rash, Scarring, Dry/Scaly, Maceration, Atrophie Blanche, Cyanosis, Ecchymosis, Hemosiderin Staining, Mottled, Pallor, Rubor, Erythema. Periwound temperature was noted as No Abnormality. Wound #4 status is Open. Original cause of wound was Other Lesion. The wound is located on the Left,Medial Malleolus. The wound measures 1cm length x 1.2cm width x 0.3cm depth; 0.942cm^2 area and 0.283cm^3 volume. The wound is limited to skin breakdown. There is no undermining noted. There is a small amount of serosanguineous drainage noted. The wound margin is distinct with the outline attached to the wound base. There is small (1-33%) pink granulation within the wound bed.  There is a medium (34- 66%) amount of necrotic tissue within the wound bed including Adherent Slough. The periwound skin appearance exhibited: Localized Edema, Moist. The periwound skin appearance did not exhibit: Callus, Crepitus, Excoriation, Fluctuance, Friable, Induration, Rash, Scarring, Dry/Scaly, Maceration, Atrophie Blanche, Cyanosis, Ecchymosis, Hemosiderin Staining, Mottled, Pallor, Rubor, Erythema. Periwound temperature was noted as No Abnormality. Wound #  5 status is Open. Original cause of wound was Shear/Friction. The wound is located on the Right,Lateral Forearm. The wound measures 3.7cm length x 2.6cm width x 0.1cm depth; 7.556cm^2 area Philip [name], Philip [name]. ([id number]) and 0.756cm^3 volume. The wound is limited to skin breakdown. There is no tunneling or undermining noted. There is a small amount of serous drainage noted. The wound margin is distinct with the outline attached to the wound base. There is large (67-100%) red granulation within the wound bed. There is no necrotic tissue within the wound bed. The periwound skin appearance exhibited: Moist. The periwound skin appearance did not exhibit: Callus, Crepitus, Excoriation, Fluctuance, Friable, Induration, Localized Edema, Rash, Scarring, Dry/Scaly, Maceration, Atrophie Blanche, Cyanosis, Ecchymosis, Hemosiderin Staining, Mottled, Pallor, Rubor, Erythema. 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 There was a Non-Viable Tissue Open Wound/Selective [phone]) debridement with total area of 1.69 sq cm performed by Jaystin Mcgarvey, Jackson Latino., MD. with the following instrument(s): Forceps to remove Non-Viable tissue/material including Exudate, Fibrin/Slough, and Subcutaneous after achieving pain control using Lidocaine 4% Topical Solution. A time out was conducted prior to the start of the procedure. A Minimum amount of bleeding was controlled with Pressure. The procedure was tolerated well with a pain level of 0 throughout and a pain level of 0 following the procedure. Post Debridement Measurements: 1.3cm length x 1.3cm width x 0.3cm depth; 0.398cm^3 volume. Wound #4 Wound #4 is a Malignant Wound located on the Left,Medial Malleolus . There was a Non-Viable Tissue Open Wound/Selective [phone]) debridement with total area of 1.2 sq cm performed by Laakea Pereira, Jackson Latino., MD. with the following instrument(s): Forceps to remove Non-Viable tissue/material including Exudate, Fibrin/Slough, and Subcutaneous after achieving pain control using Lidocaine 4% Topical Solution. A time out was conducted prior to the start of the procedure. A Minimum amount of bleeding was controlled with Pressure. The procedure was tolerated well with a pain level of 0 throughout and a pain level of 0 following the procedure. Post Debridement Measurements: 1cm length x 1.2cm width x 0.3cm depth; 0.283cm^3 volume. Philip [name], Philip [name] ([id number]) Plan Wound Cleansing: Wound #3 Right,Dorsal Foot: Cleanse wound with mild soap and water May Shower, gently pat wound dry prior to applying new dressing. May shower with protection. Wound #4 Left,Medial Malleolus: Cleanse wound with mild soap and water May Shower, gently pat wound dry prior to applying new dressing. May shower with protection. Skin Barriers/Peri-Wound Care: Wound #3 Right,Dorsal Foot: Skin Prep Wound #4 Left,Medial Malleolus: Skin Prep Wound #5 Right,Lateral  Forearm: Skin Prep Primary Wound Dressing: Wound #3 Right,Dorsal Foot: Aquacel Ag Wound #4 Left,Medial Malleolus: Aquacel Ag Wound #5 Right,Lateral Forearm: Prisma Ag Other: - mepitel Secondary Dressing: Wound #3 Right,Dorsal Foot: Boardered Foam Dressing Wound #4 Left,Medial Malleolus: Boardered Foam Dressing Wound #5 Right,Lateral Forearm: Boardered Foam Dressing Dressing Change Frequency: Wound #3 Right,Dorsal Foot: Change dressing every other day. Wound #4 Left,Medial Malleolus: Change dressing every other day. Wound #5 Right,Lateral Forearm: Change dressing every other day. Follow-up Appointments: Wound #3 Right,Dorsal Foot: Return Appointment in 1  week. Wound #4 Left,Medial Malleolus: Return Appointment in 1 week. Wound #5 Right,Lateral Forearm: Return Appointment in 1 week. Philip [name], Philip [name] ([id number]) Home Health: Wound #3 Right,Dorsal Foot: [place] Visits - [place] Nurse may visit PRN to address patient s wound care needs. FACE TO FACE ENCOUNTER: MEDICARE and MEDICAID PATIENTS: I certify that this patient is under my care and that I had a face-to-face encounter that meets the physician face-to-face encounter requirements with this patient on this date. The encounter with the patient was in whole or in part for the following MEDICAL CONDITION: (primary reason for [place]) MEDICAL NECESSITY: I certify, that based on my findings, NURSING services are a medically necessary home health service. HOME BOUND STATUS: I certify that my clinical findings support that this patient is homebound (i.e., Due to illness or injury, pt requires aid of supportive devices such as crutches, cane, wheelchairs, walkers, the use of special transportation or the assistance of another person to leave their place of residence. There is a normal inability to leave the home and doing so requires considerable and taxing effort. Other absences  are for medical reasons / religious services and are infrequent or of short duration when for other reasons). If current dressing causes regression in wound condition, may D/C ordered dressing product/s and apply Normal Saline Moist Dressing daily until next [place] / Other MD appointment. [place] of regression in wound condition at [phone]. Please direct any NON-WOUND related issues/requests for orders to patient's Primary Care Physician Wound #4 Left,Medial Malleolus: [place] Visits - [place] Nurse may visit PRN to address patient s wound care needs. FACE TO FACE ENCOUNTER: MEDICARE and MEDICAID PATIENTS: I certify that this patient is under my care and that I had a face-to-face encounter that meets the physician face-to-face encounter requirements with this patient on this date. The encounter with the patient was in whole or in part for the following MEDICAL CONDITION: (primary reason for [place]) MEDICAL NECESSITY: I certify, that based on my findings, NURSING services are a medically necessary home health service. HOME BOUND STATUS: I certify that my clinical findings support that this patient is homebound (i.e., Due to illness or injury, pt requires aid of supportive devices such as crutches, cane, wheelchairs, walkers, the use of special transportation or the assistance of another person to leave their place of residence. There is a normal inability to leave the home and doing so requires considerable and taxing effort. Other absences are for medical reasons / religious services and are infrequent or of short duration when for other reasons). If current dressing causes regression in wound condition, may D/C ordered dressing product/s and apply Normal Saline Moist Dressing daily until next [place] / Other MD appointment. [place] of regression in wound condition at  [phone]. Please direct any NON-WOUND related issues/requests for orders to patient's Primary Care Physician Wound #5 Right,Lateral Forearm: [place] Nurse may visit PRN to address patient s wound care needs. FACE TO FACE ENCOUNTER: MEDICARE and MEDICAID PATIENTS: I certify that this patient is under my care and that I had a face-to-face encounter that meets the physician face-to-face encounter requirements with this patient on this date. The encounter with the patient was in whole or in part for the following MEDICAL CONDITION: (primary reason for [place]) MEDICAL NECESSITY: I certify, that based on my  findings, NURSING services are a medically necessary home health service. HOME BOUND STATUS: I certify that my clinical findings support that this patient is homebound (i.e., Due to illness or injury, pt requires aid of supportive devices such as crutches, cane, wheelchairs, walkers, the use of special transportation or the assistance of another person to leave their place of residence. There is a normal inability to leave the home and doing so requires considerable and taxing effort. Other absences are for medical reasons / religious services and are infrequent or of short duration when for other reasons). If current dressing causes regression in wound condition, may D/C ordered dressing product/s and apply Normal Saline Moist Dressing daily until next [place] / Other MD appointment. [place] of regression in wound condition at [phone]. JOAQUIM, TOLEN ([id number]) Please direct any NON-WOUND related issues/requests for orders to patient's Primary Care Physician We will use a piece of Prisma on the granulation tissue on his right forearm and covered with Mepitel and a Tegaderm. We will leave this on all week. On his right foot I would use Prisma AG on his left ankle I will use silver alginate.  The dressing on the feet can be changed every other day. He'll see me back next week. Electronic Signature(s) Signed: 10/11/2014 3:04:48 PM By: Christin Fudge MD, FACS Previous Signature: 10/11/2014 10:16:48 AM Version By: Christin Fudge MD, FACS Entered By: Christin Fudge on 10/11/2014 15:04:48 Philip [name] ([id number]) -------------------------------------------------------------------------------- SuperBill Details Patient Name: TREYVEN, LAFAUCI. Date of Service: 10/11/2014 Medical Record Number: [id number] Patient Account Number: [id number] Date of Birth/Sex: November 03, 1918 (81 y.o. 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 I82.401 Acute embolism and thrombosis of unspecified deep veins of right lower extremity S91.301A Unspecified open wound, right foot, initial encounter Z92.21 Personal history of antineoplastic chemotherapy L97.522 Non-pressure chronic ulcer of other part of left foot with fat layer exposed S41.111A Laceration without foreign body of right upper arm, initial encounter Facility Procedures The patient participates with Medicare or their insurance follows the Medicare Facility Guidelines: CPT4 Code Description Modifier Quantity [id number] - DEBRIDE WOUND 1ST 20 SQ CM OR < 1 ICD-10 Description Diagnosis I70.232 Atherosclerosis of native  arteries of right leg with ulceration of calf L97.522 Non-pressure chronic ulcer of other part of left foot with fat layer exposed Physician Procedures CPT4 Code Description: [id number] - WC PHYS DEBR WO ANESTH 20 SQ CM ICD-10 Description Diagnosis I70.232 Atherosclerosis of native arteries of right leg with ulcera L97.522 Non-pressure chronic ulcer of other part of left foot  with Modifier: tion of cal fat layer e Quantity: 1 f xposed Electronic Signature(s) Signed: 10/11/2014 10:16:48 AM By: Christin Fudge MD, FACS Entered By: Christin Fudge on 10/11/2014 09:38:05

## 2014-10-11 NOTE — Progress Notes (Signed)
Philip [name] ([id number]) Visit Report for 10/11/2014 Arrival Information Details Patient Name: Philip [name], Philip [name]. Date of Service: 10/11/2014 9:00 AM Medical Record Number: [id number] Patient Account Number: [id number] Date of Birth/Sex: 1918-10-18 (81 y.o. Male) Treating RN: Baruch Gouty, RN, BSN, Velva Harman Primary Care Physician: Hortencia Pilar Other Clinician: Referring Physician: Hortencia Pilar Treating Physician/Extender: Frann Rider in Treatment: 23 Visit Information History Since Last Visit Any new allergies or adverse reactions: No Patient Arrived: [name] Had a fall or experienced change in No Arrival Time: 09:07 activities of daily living that may affect Accompanied By: caergiver risk of falls: Transfer Assistance: None Signs or symptoms of abuse/neglect since last No Patient Identification Verified: Yes visito Secondary Verification Process Yes Hospitalized since last visit: No Completed: Has Dressing in Place as Prescribed: Yes Patient Requires Transmission- No Pain Present Now: No Based Precautions: Patient Has Alerts: Yes Patient Alerts: Patient on Blood Thinner Electronic Signature(s) Signed: 10/11/2014 2:01:00 PM By: Regan Lemming BSN, RN Entered By: Regan Lemming on 10/11/2014 09:07:58 Philip [name] ([id number]) -------------------------------------------------------------------------------- Encounter Discharge Information Details Patient Name: Philip [name]. Date of Service: 10/11/2014 9:00 AM Medical Record Number: [id number] Patient Account Number: [id number] Date of Birth/Sex: 1918/10/28 (81 y.o. Male) Treating RN: Primary Care Physician: Hortencia Pilar Other Clinician: Referring Physician: Hortencia Pilar Treating Physician/Extender: Frann Rider in Treatment: 3 Encounter Discharge Information Items Schedule Follow-up Appointment: No Medication Reconciliation completed No and provided to Patient/Care Jayma Volpi: Provided on Clinical Summary of  Care: 10/11/2014 Form Type Recipient Paper Patient GT Electronic Signature(s) Signed: 10/11/2014 9:45:08 AM By: Ruthine Dose Entered By: Ruthine Dose on 10/11/2014 09:45:08 Philip [name] ([id number]) -------------------------------------------------------------------------------- Lower Extremity Assessment Details Patient Name: Philip [name]. Date of Service: 10/11/2014 9:00 AM Medical Record Number: [id number] Patient Account Number: [id number] Date of Birth/Sex: [birth year] (81 y.o. Male) Treating RN: Baruch Gouty, RN, BSN, Velva Harman Primary Care Physician: Hortencia Pilar Other Clinician: Referring Physician: Hortencia Pilar Treating Physician/Extender: Frann Rider in Treatment: 23 Vascular Assessment Pulses: Posterior Tibial Dorsalis Pedis Palpable: [Right:Yes] Extremity colors, hair growth, and conditions: Extremity Color: [Right:Mottled] Hair Growth on Extremity: [Right:No] Temperature of Extremity: [Right:Warm] Capillary Refill: [Right:< 3 seconds] Dependent Rubor: [Right:No] Blanched when Elevated: [Right:No] Lipodermatosclerosis: [Right:No] Toe Nail Assessment Left: Right: Thick: Yes Discolored: Yes Deformed: Yes Improper Length and Hygiene: No Electronic Signature(s) Signed: 10/11/2014 2:01:00 PM By: Regan Lemming BSN, RN Entered By: Regan Lemming on 10/11/2014 09:10:00 Philip [name] ([id number]) -------------------------------------------------------------------------------- Multi Wound Chart Details Patient Name: Philip [name]. Date of Service: 10/11/2014 9:00 AM Medical Record Number: [id number] Patient Account Number: [id number] Date of Birth/Sex: [birth year] (81 y.o. 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] [4:Malignant Wound] [5:Skin Tear] Comorbid History: [3:Cataracts, Arrhythmia, Congestive Heart Failure] [4:Cataracts, Arrhythmia, Congestive Heart Failure] [5:Cataracts, Arrhythmia, Congestive Heart Failure] Date Acquired: [3:07/01/2014] [4:09/20/2014] [5:10/01/2014] Weeks of Treatment: [3:13] [4:3] [5:1] Wound Status: [3:Open] [4:Open] [5:Open] Measurements L x W x D 1.3x1.3x0.3 [4:1x1.2x0.3] [5:3.7x2.6x0.1] (cm) Area (cm) : [3:1.327] [4:0.942] [5:7.556] Volume (cm) : [3:0.398] [4:0.283] [5:0.756] % Reduction in Area: [3:29.60%] [4:-500.00%] [5:-20.30%] % Reduction in Volume: 47.20% [4:-1668.70%] [5:-20.40%] Classification: [  3:Full Thickness Without Exposed Support Structures] [4:Partial Thickness] [5:Partial Thickness] Exudate Amount: [3:Medium] [4:Small] [5:Small] Exudate Type: [3:Serosanguineous] [4:Serosanguineous] [5:Serous] Exudate Color: [3:red, brown] [4:red, brown] [5:amber] Wound Margin: [3:Distinct, outline attached] [4:Distinct, outline attached] [5:Distinct, outline attached] Granulation Amount: [3:Small (1-33%)] [4:Small (1-33%)] [5:Large (67-100%)] Granulation Quality: [3:Pink] [4:Pink] [5:Red] Necrotic Amount: [3:Medium (34-66%)] [4:Medium (34-66%)] [5:None Present (0%)] Exposed Structures: [3:Fascia: No Fat: No Tendon: No Muscle: No Joint: No] [4:Fascia: No Fat: No Tendon: No Muscle: No Joint: No] [5:Fascia: No Fat: No Tendon: No Muscle: No Joint: No] Bone: No Bone: No Bone: No Limited to Skin Limited to Skin Limited to Skin Breakdown Breakdown Breakdown Epithelialization: None None Large  (67-100%) Periwound Skin Texture: Edema: Yes Edema: Yes Edema: No Excoriation: No Excoriation: No Excoriation: No Induration: No Induration: No Induration: No Callus: No Callus: No Callus: No Crepitus: No Crepitus: No Crepitus: No Fluctuance: No Fluctuance: No Fluctuance: No Friable: No Friable: No Friable: No Rash: No Rash: No Rash: No Scarring: No Scarring: No Scarring: No Periwound Skin Moist: Yes Moist: Yes Moist: Yes Moisture: Maceration: No Maceration: No Maceration: No Dry/Scaly: No Dry/Scaly: No Dry/Scaly: No Periwound Skin Color: Atrophie Blanche: No Atrophie Blanche: No Atrophie Blanche: No Cyanosis: No Cyanosis: No Cyanosis: No Ecchymosis: No Ecchymosis: No Ecchymosis: No Erythema: No Erythema: No Erythema: No Hemosiderin Staining: No Hemosiderin Staining: No Hemosiderin Staining: No Mottled: No Mottled: No Mottled: No Pallor: No Pallor: No Pallor: No Rubor: No Rubor: No Rubor: No Temperature: No Abnormality No Abnormality N/A Tenderness on No No No Palpation: Wound Preparation: Ulcer Cleansing: Ulcer Cleansing: Ulcer Cleansing: Rinsed/Irrigated with Rinsed/Irrigated with Rinsed/Irrigated with Saline Saline Saline Topical Anesthetic Topical Anesthetic Topical Anesthetic Applied: Other: lidocaine Applied: Other: lidocaine Applied: None 4% 4% Treatment Notes Electronic Signature(s) Signed: 10/11/2014 2:01:00 PM By: Regan Lemming BSN, RN Entered By: Regan Lemming on 10/11/2014 09:18:27 Philip [name] ([id number]) -------------------------------------------------------------------------------- [place] Details Patient Name: RECTOR, DEVONSHIRE. Date of Service: 10/11/2014 9:00 AM Medical Record Number: [id number] Patient Account Number: [id number] Date of Birth/Sex: 08-23-1918 (81 y.o. Male) Treating RN: Baruch Gouty, RN, BSN, Velva Harman Primary Care Physician: Hortencia Pilar Other Clinician: Referring Physician: Hortencia Pilar Treating Physician/Extender: Frann Rider in Treatment: 37 Active Inactive Abuse / Safety / Falls / Self Care Management Nursing Diagnoses: Abuse or neglect; actual or potential Potential for falls Goals: Patient will remain injury free Date Initiated: 05/02/2014 Goal Status: Active Interventions: Assess fall risk on admission and as needed Notes: Necrotic Tissue Nursing Diagnoses: Impaired tissue integrity related to necrotic/devitalized tissue Goals: Necrotic/devitalized tissue will be minimized in the wound bed Date Initiated: 05/02/2014 Goal Status: Active Interventions: Assess patient pain level pre-, during and post procedure and prior to discharge Treatment Activities: Apply topical anesthetic as ordered : 10/11/2014 Notes: Orientation to the Wound Care Program Nursing Diagnoses: Knowledge deficit related to the wound healing center program ALUCARD, FEARNOW ([id number]) Goals: Patient/caregiver will verbalize understanding of the [facility] Date Initiated: 05/02/2014 Goal Status: Active Interventions: Provide education on orientation to the wound center Notes: Electronic Signature(s) Signed: 10/11/2014 2:01:00 PM By: Regan Lemming BSN, RN Entered By: Regan Lemming on 10/11/2014 09:18:17 Philip [name] ([id number]) -------------------------------------------------------------------------------- Pain Assessment Details Patient Name: Philip [name]. Date of Service: 10/11/2014 9:00 AM Medical Record Number: [id number] Patient Account Number: [id number] Date of Birth/Sex: 02-18-1919 (81 y.o. Male) Treating RN: Baruch Gouty, RN, BSN, Velva Harman Primary Care Physician: Hortencia Pilar Other Clinician: Referring Physician: Hortencia Pilar Treating Physician/Extender: Frann Rider in Treatment: 48 Active Problems Location  of Pain Severity and Description of Pain Patient Has Paino No Site Locations Pain Management and Medication Current Pain  Management: Electronic Signature(s) Signed: 10/11/2014 2:01:00 PM By: Regan Lemming BSN, RN Entered By: Regan Lemming on 10/11/2014 09:08:06 Philip [name] ([id number]) -------------------------------------------------------------------------------- Wound Assessment Details Patient Name: BRYSYN, BRANDENBERGER. Date of Service: 10/11/2014 9:00 AM Medical Record Number: [id number] Patient Account Number: [id number] Date of Birth/Sex: 28-Mar-1919 (81 y.o. Male) Treating RN: Baruch Gouty, RN, BSN, [place] Primary Care Physician: Hortencia Pilar Other Clinician: Referring Physician: Hortencia Pilar Treating Physician/Extender: Frann Rider in Treatment: 23 Wound Status Wound Number: 3 Primary Malignant Wound Etiology: Wound Location: Right Foot - Dorsal Wound Status: Open Wounding Event: Surgical Injury Comorbid Cataracts, Arrhythmia, Congestive Date Acquired: 07/01/2014 History: Heart Failure Weeks Of Treatment: 13 Clustered Wound: No Photos Photo Uploaded By: Regan Lemming on 10/11/2014 15:16:46 Wound Measurements Length: (cm) 1.3 Width: (cm) 1.3 Depth: (cm) 0.3 Area: (cm) 1.327 Volume: (cm) 0.398 % Reduction in Area: 29.6% % Reduction in Volume: 47.2% Epithelialization: None Tunneling: No Undermining: No Wound Description Full Thickness Without Exposed Classification: Support Structures Wound Margin: Distinct, outline attached Exudate Medium Amount: Exudate Type: Serosanguineous Exudate Color: red, brown Foul Odor After Cleansing: No Wound Bed Granulation Amount: Small (1-33%) Exposed Structure Granulation Quality: Pink Fascia Exposed: No Necrotic Amount: Medium (34-66%) Fat Layer Exposed: No STACEY, MAURA ([id number]) Necrotic Quality: Adherent Slough Tendon Exposed: No Muscle Exposed: No Joint Exposed: No Bone Exposed: No Limited to Skin Breakdown Periwound Skin Texture Texture Color No Abnormalities Noted: No No Abnormalities Noted: No Callus: No Atrophie  Blanche: No Crepitus: No Cyanosis: No Excoriation: No Ecchymosis: No Fluctuance: No Erythema: No Friable: No Hemosiderin Staining: No Induration: No Mottled: No Localized Edema: Yes Pallor: No Rash: No Rubor: No Scarring: No Temperature / Pain Moisture Temperature: No Abnormality No Abnormalities Noted: No Dry / Scaly: No Maceration: No Moist: Yes Wound Preparation Ulcer Cleansing: Rinsed/Irrigated with Saline Topical Anesthetic Applied: Other: lidocaine 4%, Electronic Signature(s) Signed: 10/11/2014 2:01:00 PM By: Regan Lemming BSN, RN Entered By: Regan Lemming on 10/11/2014 09:17:13 Philip [name] ([id number]) -------------------------------------------------------------------------------- Wound Assessment Details Patient Name: TAE, VONADA. Date of Service: 10/11/2014 9:00 AM Medical Record Number: [id number] Patient Account Number: [id number] Date of Birth/Sex: [birth year] (81 y.o. Male) Treating RN: Baruch Gouty, RN, BSN, [place] Primary Care Physician: Hortencia Pilar Other Clinician: Referring Physician: Hortencia Pilar Treating Physician/Extender: Frann Rider in Treatment: 23 Wound Status Wound Number: 4 Primary Malignant Wound Etiology: Wound Location: Left Malleolus - Medial Wound Status: Open Wounding Event: Other Lesion Comorbid Cataracts, Arrhythmia, Congestive Date Acquired: 09/20/2014 History: Heart Failure Weeks Of Treatment: 3 Clustered Wound: No Photos Photo Uploaded By: Regan Lemming on 10/11/2014 15:16:47 Wound Measurements Length: (cm) 1 Width: (cm) 1.2 Depth: (cm) 0.3 Area: (cm) 0.942 Volume: (cm) 0.283 % Reduction in Area: -500% % Reduction in Volume: -1668.7% Epithelialization: None Undermining: No Wound Description Classification: Partial Thickness Wound Margin: Distinct, outline attached Exudate Amount: Small Exudate Type: Serosanguineous Exudate Color: red, brown Foul Odor After Cleansing: No Wound Bed Granulation Amount: Small  (1-33%) Exposed Structure Granulation Quality: Pink Fascia Exposed: No Necrotic Amount: Medium (34-66%) Fat Layer Exposed: No Necrotic Quality: Adherent Slough Tendon Exposed: No MICHA, DOSANJH ([id number]) Muscle Exposed: No Joint Exposed: No Bone Exposed: No Limited to Skin Breakdown Periwound Skin Texture Texture Color No Abnormalities Noted: No No Abnormalities Noted: No Callus: No Atrophie Blanche: No Crepitus: No Cyanosis: No Excoriation: No Ecchymosis: No Fluctuance: No Erythema: No Friable: No Hemosiderin Staining: No  Induration: No Mottled: No Localized Edema: Yes Pallor: No Rash: No Rubor: No Scarring: No Temperature / Pain Moisture Temperature: No Abnormality No Abnormalities Noted: No Dry / Scaly: No Maceration: No Moist: Yes Wound Preparation Ulcer Cleansing: Rinsed/Irrigated with Saline Topical Anesthetic Applied: Other: lidocaine 4%, Electronic Signature(s) Signed: 10/11/2014 2:01:00 PM By: Regan Lemming BSN, RN Entered By: Regan Lemming on 10/11/2014 09:17:53 Philip [name] ([id number]) -------------------------------------------------------------------------------- Wound Assessment Details Patient Name: PAYNE, GARSKE. Date of Service: 10/11/2014 9:00 AM Medical Record Number: [id number] Patient Account Number: [id number] Date of Birth/Sex: Nov 30, 1918 (81 y.o. Male) Treating RN: Baruch Gouty, RN, BSN, Velva Harman Primary Care Physician: Hortencia Pilar Other Clinician: Referring Physician: Hortencia Pilar Treating Physician/Extender: Frann Rider in Treatment: 23 Wound Status Wound Number: 5 Primary Skin Tear Etiology: Wound Location: Right Forearm - Lateral Wound Status: Open Wounding Event: Shear/Friction Comorbid Cataracts, Arrhythmia, Congestive Date Acquired: 10/01/2014 History: Heart Failure Weeks Of Treatment: 1 Clustered Wound: No Photos Photo Uploaded By: Regan Lemming on 10/11/2014 15:16:47 Wound Measurements Length: (cm) 3.7 Width:  (cm) 2.6 Depth: (cm) 0.1 Area: (cm) 7.556 Volume: (cm) 0.756 % Reduction in Area: -20.3% % Reduction in Volume: -20.4% Epithelialization: Large (67-100%) Tunneling: No Undermining: No Wound Description Classification: Partial Thickness Wound Margin: Distinct, outline attached Exudate Amount: Small Exudate Type: Serous Exudate Color: amber Foul Odor After Cleansing: No Wound Bed Granulation Amount: Large (67-100%) Exposed Structure Granulation Quality: Red Fascia Exposed: No Necrotic Amount: None Present (0%) Fat Layer Exposed: No Tendon Exposed: No JAXTIN, RAIMONDO ([id number]) Muscle Exposed: No Joint Exposed: No Bone Exposed: No Limited to Skin Breakdown Periwound Skin Texture Texture Color No Abnormalities Noted: No No Abnormalities Noted: No Callus: No Atrophie Blanche: No Crepitus: No Cyanosis: No Excoriation: No Ecchymosis: No Fluctuance: No Erythema: No Friable: No Hemosiderin Staining: No Induration: No Mottled: No Localized Edema: No Pallor: No Rash: No Rubor: No Scarring: No Moisture No Abnormalities Noted: No Dry / Scaly: No Maceration: No Moist: Yes Wound Preparation Ulcer Cleansing: Rinsed/Irrigated with Saline Topical Anesthetic Applied: None Electronic Signature(s) Signed: 10/11/2014 2:01:00 PM By: Regan Lemming BSN, RN Entered By: Regan Lemming on 10/11/2014 09:18:07 Philip [name] ([id number]) -------------------------------------------------------------------------------- [place] Details Patient Name: Philip [name]. Date of Service: 10/11/2014 9:00 AM Medical Record Number: [id number] Patient Account Number: [id number] Date of Birth/Sex: [birth year] (81 y.o. Male) Treating RN: Baruch Gouty, RN, BSN, [place] Primary Care Physician: Hortencia Pilar Other Clinician: Referring Physician: Hortencia Pilar Treating Physician/Extender: Frann Rider in Treatment: 23 Vital Signs Time Taken: 09:08 Temperature (F): 97.7 Height (in): 69 Pulse  (bpm): 75 Weight (lbs): 179 Respiratory Rate (breaths/min): 16 Body Mass Index (BMI): 26.4 Blood Pressure (mmHg): 138/60 Reference Range: 80 - 120 mg / dl Electronic Signature(s) Signed: 10/11/2014 2:01:00 PM By: Regan Lemming BSN, RN Entered ByRegan Lemming on 10/11/2014 11:57:26

## 2014-10-17 ENCOUNTER — Encounter: Payer: Medicare Other | Admitting: Surgery

## 2014-10-17 DIAGNOSIS — S91301A Unspecified open wound, right foot, initial encounter: Secondary | ICD-10-CM | POA: Diagnosis not present

## 2014-10-18 NOTE — Progress Notes (Signed)
Philip [name], Philip [name] ([id number]) Visit Report for 10/17/2014 Chief Complaint Document Details Patient Name: Philip [name], Philip [name]. Date of Service: 10/17/2014 9:30 AM Medical Record Number: [id number] Patient Account Number: [id number] Date of Birth/Sex: Nov 03, 1918 (81 y.o. Male) Treating RN: Primary Care Physician: Hortencia Pilar Other Clinician: Referring Physician: Hortencia Pilar Treating Physician/Extender: Frann Rider in Treatment: 24 Information Obtained from: Patient Chief Complaint R foot ulcer. L forearm ulcer. 07/19/2014 -- about 2 weeks ago he had a surgical procedure done by dermatologist in [place] and has an open surgical wound on the dorsum of the right foot. Electronic Signature(s) Signed: 10/17/2014 1:47:03 PM By: Christin Fudge MD, FACS Entered By: Christin Fudge on 10/17/2014 10:28:10 Philip [name] ([id number]) -------------------------------------------------------------------------------- Debridement Details Patient Name: Philip [name], Philip [name]. Date of Service: 10/17/2014 9:30 AM Medical Record Number: [id number] Patient Account Number: [id number] Date of Birth/Sex: 1918/05/19 (81 y.o. Male) Treating RN: Primary Care Physician: Hortencia Pilar Other Clinician: Referring Physician: Hortencia Pilar Treating Physician/Extender: Frann Rider in Treatment: 24 Debridement Performed for Wound #3 Right,Dorsal Foot Assessment: Performed By: Physician Pat Patrick., MD Debridement: Debridement Pre-procedure Yes Verification/Time Out Taken: Start Time: 10:15 Pain Control: Lidocaine 4% Topical Solution Level: Skin/Subcutaneous Tissue Total Area Debrided (L x 1.3 (cm) x 1.3 (cm) = 1.69 (cm) W): Tissue and other Non-Viable, Exudate, Fibrin/Slough, Subcutaneous material debrided: Instrument: Curette Bleeding: Minimum Hemostasis Achieved: Pressure End Time: 10:17 Procedural Pain: 0 Post Procedural Pain: 0 Response to Treatment: Procedure was tolerated well Post  Debridement Measurements of Total Wound Length: (cm) 1.3 Width: (cm) 1.3 Depth: (cm) 0.5 Volume: (cm) 0.664 Electronic Signature(s) Signed: 10/17/2014 1:47:03 PM By: Christin Fudge MD, FACS Entered By: Christin Fudge on 10/17/2014 10:28:03 Philip [name] ([id number]) -------------------------------------------------------------------------------- HPI Details Patient Name: Philip [name], Philip [name]. Date of Service: 10/17/2014 9:30 AM Medical Record Number: [id number] Patient Account Number: [id number] Date of Birth/Sex: 19-Jul-1918 (81 y.o. Male) Treating RN: Primary Care Physician: Hortencia Pilar Other Clinician: Referring Physician: Hortencia Pilar Treating Physician/Extender: Frann Rider in Treatment: 24 History of Present Illness Location: right leg Duration: Dec 2015 Modifying Factors: history of an injury to the right leg with resulting hematoma and thrombophlebitis and later an ulcer of posterior leg Associated Signs and Symptoms: marked lymphedema of the right leg. He is already on Eloquis. HPI Description: 06/14/14 -- He returns for followup today. He denies any fevers. no fresh issues and his daughter says he's been doing fine. 06/21/14 -- after he sustained a fall this week earlier he applied a bandage over this himself and did not seek any medical attention. he did however manage to control the bleeding and had a dressing in place the next morning when his son to the visit. In this dressing was removed there was further damaged skin. His right leg has been doing fine otherwise. 07/12/14 --Very pleasant 81 year old with past medical history significant for congestive heart failure (EF 15%), peripheral vascular disease, and chronic kidney disease. He was hospitalized at [facility] in December 2015 for congestive heart failure. He says that he fell during his hospital course and developed a hematoma over his right calf. He was also diagnosed with a right lower extremity DVT for which he  takes Eliquis. The hematoma subsequently turned into an ulceration around Christmas, which has healed. He subsequently developed an ulcer on his right dorsal foot and a traumatic left forearm ulcer. Per his report, he underwent biopsy of the right dorsal foot ulceration which demonstrated a skin cancer. His PCP and dermatologist office are both closed  today. I reviewed his records in [place] but find no report of biopsy or pathology. He s without complaints today. No significant pain. No fever or chills. Minimal drainage. 07/19/2014 - the patient and his son tell me that about 2 weeks ago the dermatologist did a skin biopsy and this was a large area on the dorsum of his right foot which was left open and no dressing instructions were recommended. Since then he has been called and told that it is a cancer and the need to do a further procedure but that will not happen until about 2 weeks from now. In the meanwhile the patient has not been taking care of his right foot. The left forearm where he had an abrasion and laceration is doing very well. 07/26/2014 -- Reports from 07/08/2014 from the dermatology group reviewed. A excision was done of a lesion located on the dorsum of the right foot and this was 1.7 cm in diameter which was a shave biopsy performed. The wound was left open after appropriate cauterization and the patient was given this dressing instructions. 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 08/02/2014 -- all the original problems he came with have completely resolved. He now has a surgical wound on his right foot dorsum where a skin cancer was excised. He goes to see his dermatologist this AZLAN, HANWAY ([id number]) coming Tuesday and will have definite news next Friday. 08/09/2014 he had gone to his dermatologist on Tuesday and she has injected the base of his ulcer with some  chemotherapeutic agent. He was supposed to bring some papers with him but forgot to get them and will bring them in next week. Other than that the dermatologist had suggested using Mehdi honey on the wound. 08/16/2014 -- the patient has brought in his notes from the dermatologist and on 08/06/2014 he received a injection of 5 FU, 500 mg grams into the lesion. the pathology report was also sent and it was a squamous cell carcinoma well-differentiated and edges were involved. They wanted him to use many honey for the wound dressing changes to be done 3 times a week. 08/30/2014 -- he has finished his second injection of 5-FU and has the next one in 2 weeks' time. He is doing fine otherwise. 09/13/2014 - No new complaints. No significant pain. No fever or chills. Minimal drainage. Still receiving 5-FU injections. 09/20/2014 -- He was seen by the dermatologist on 09/10/2014 and Dr. Phillip Heal injected his foot both the right on the dorsum and left near the medial malleolus with 5-FU. The next dose of 5-FU is to be given after 3 months. The patient says he now has a spot on the left medial malleolus where he was injected with 5-FU. 09/27/2014 -- the area on the left ankle where he was injected with 5-FU is now a full-blown ulcer. He also has mild pain in both ankle areas. 10/04/2014 - large had a bit of a fall and injured his right arm last evening and has had a laceration with no evidence of any foreign body in the right forearm. Electronic Signature(s) Signed: 10/17/2014 1:47:03 PM By: Christin Fudge MD, FACS Entered By: Christin Fudge on 10/17/2014 10:28:14 Philip [name] ([id number]) -------------------------------------------------------------------------------- Physical Exam Details Patient Name: Philip [name], Philip [name]. Date of Service: 10/17/2014 9:30 AM Medical Record Number: [id number] Patient Account Number: [id number] Date of Birth/Sex: 05-06-1918 (81 y.o. Male) Treating RN: Primary Care Physician:  Hortencia Pilar  Other Clinician: Referring Physician: Hortencia Pilar Treating Physician/Extender: Frann Rider in Treatment: 24 Constitutional . Pulse regular. Respirations normal and unlabored. Afebrile. . Eyes Nonicteric. Reactive to light. Ears, Nose, Mouth, and Throat Lips, teeth, and gums WNL.Marland Kitchen Moist mucosa without lesions . Neck supple and nontender. No palpable supraclavicular or cervical adenopathy. Normal sized without goiter. Respiratory WNL. No retractions.. Cardiovascular Pedal Pulses WNL. No clubbing, cyanosis or edema. Musculoskeletal Adexa without tenderness or enlargement.. Digits and nails w/o clubbing, cyanosis, infection, petechiae, ischemia, or inflammatory conditions.. Integumentary (Hair, Skin) No suspicious lesions. No crepitus or fluctuance. No peri-wound warmth or erythema. No masses.Marland Kitchen Psychiatric Judgement and insight Intact.. No evidence of depression, anxiety, or agitation.. Notes The right forearm is almost completely healed and the right dorsum of the foot looks very clean. His left ankle still has some slough and needs sharp debridement. Electronic Signature(s) Signed: 10/17/2014 1:47:03 PM By: Christin Fudge MD, FACS Entered By: Christin Fudge on 10/17/2014 10:29:23 Philip [name] ([id number]) -------------------------------------------------------------------------------- Physician Orders Details Patient Name: KENDRA, WOOLFORD. Date of Service: 10/17/2014 9:30 AM Medical Record Number: [id number] Patient Account Number: [id number] Date of Birth/Sex: April 06, 1919 (81 y.o. Male) Treating RN: Baruch Gouty, RN, BSN, Velva Harman Primary Care Physician: Hortencia Pilar Other Clinician: Referring Physician: Hortencia Pilar Treating Physician/Extender: Frann Rider in Treatment: 5 Verbal / Phone Orders: Yes Clinician: Afful, RN, BSN, Rita Read Back and Verified: Yes Diagnosis Coding Wound Cleansing Wound #3 Right,Dorsal Foot o Cleanse wound with mild  soap and water o May Shower, gently pat wound dry prior to applying new dressing. o May shower with protection. Wound #4 Left,Medial Malleolus o Cleanse wound with mild soap and water o May Shower, gently pat wound dry prior to applying new dressing. o May shower with protection. Skin Barriers/Peri-Wound Care Wound #3 Right,Dorsal Foot o Skin Prep Wound #4 Left,Medial Malleolus o Skin Prep Wound #5 Right,Lateral Forearm o Skin Prep Primary Wound Dressing Wound #3 Right,Dorsal Foot o Prisma Ag Wound #4 Left,Medial Malleolus o Aquacel Ag Wound #5 Right,Lateral Forearm o Prisma Ag o Other: - mepitel Secondary Dressing Wound #3 Right,Dorsal Foot o Boardered Foam Dressing Wound #4 Left,Medial Malleolus DELOS, KLICH. ([id number]) o Boardered Foam Dressing Wound #5 Right,Lateral Forearm o Boardered Foam Dressing Dressing Change Frequency Wound #3 Right,Dorsal Foot o Change dressing every other day. Wound #4 Left,Medial Malleolus o Change dressing every other day. Wound #5 Right,Lateral Forearm o Change dressing every other day. Follow-up Appointments Wound #4 Left,Medial Malleolus o Return Appointment in 2 weeks. Wound #5 Right,Lateral Forearm o Return Appointment in 2 weeks. Home Health Wound #3 [place] Visits - [place] Nurse may visit PRN to address patientos wound care needs. o FACE TO FACE ENCOUNTER: MEDICARE and MEDICAID PATIENTS: I certify that this patient is under my care and that I had a face-to-face encounter that meets the physician face-to-face encounter requirements with this patient on this date. The encounter with the patient was in whole or in part for the following MEDICAL CONDITION: (primary reason for [place]) MEDICAL NECESSITY: I certify, that based on my findings, NURSING services are a medically necessary home health service. HOME BOUND  STATUS: I certify that my clinical findings support that this patient is homebound (i.e., Due to illness or injury, pt requires aid of supportive devices such as crutches, cane, wheelchairs, walkers, the use of special transportation or the assistance of another person to leave their place of residence. There is a normal inability  to leave the home and doing so requires considerable and taxing effort. Other absences are for medical reasons / religious services and are infrequent or of short duration when for other reasons). o If current dressing causes regression in wound condition, may D/C ordered dressing product/s and apply Normal Saline Moist Dressing daily until next [place] / Other MD appointment. [place] of regression in wound condition at [phone]. o Please direct any NON-WOUND related issues/requests for orders to patient's Primary Care Physician Wound #4 [place] Visits - [place] Nurse may visit PRN to address patientos wound care needs. CROSS, JORGE ([id number]) o FACE TO FACE ENCOUNTER: MEDICARE and MEDICAID PATIENTS: I certify that this patient is under my care and that I had a face-to-face encounter that meets the physician face-to-face encounter requirements with this patient on this date. The encounter with the patient was in whole or in part for the following MEDICAL CONDITION: (primary reason for [place]) MEDICAL NECESSITY: I certify, that based on my findings, NURSING services are a medically necessary home health service. HOME BOUND STATUS: I certify that my clinical findings support that this patient is homebound (i.e., Due to illness or injury, pt requires aid of supportive devices such as crutches, cane, wheelchairs, walkers, the use of special transportation or the assistance of another person to leave their place of residence. There is a normal  inability to leave the home and doing so requires considerable and taxing effort. Other absences are for medical reasons / religious services and are infrequent or of short duration when for other reasons). o If current dressing causes regression in wound condition, may D/C ordered dressing product/s and apply Normal Saline Moist Dressing daily until next [place] / Other MD appointment. [place] of regression in wound condition at [phone]. o Please direct any NON-WOUND related issues/requests for orders to patient's Primary Care Physician Wound #5 Right,Lateral Forearm o [place] Visits - [place] Nurse may visit PRN to address patientos wound care needs. o FACE TO FACE ENCOUNTER: MEDICARE and MEDICAID PATIENTS: I certify that this patient is under my care and that I had a face-to-face encounter that meets the physician face-to-face encounter requirements with this patient on this date. The encounter with the patient was in whole or in part for the following MEDICAL CONDITION: (primary reason for [place]) MEDICAL NECESSITY: I certify, that based on my findings, NURSING services are a medically necessary home health service. HOME BOUND STATUS: I certify that my clinical findings support that this patient is homebound (i.e., Due to illness or injury, pt requires aid of supportive devices such as crutches, cane, wheelchairs, walkers, the use of special transportation or the assistance of another person to leave their place of residence. There is a normal inability to leave the home and doing so requires considerable and taxing effort. Other absences are for medical reasons / religious services and are infrequent or of short duration when for other reasons). o If current dressing causes regression in wound condition, may D/C ordered dressing product/s and apply Normal Saline Moist Dressing daily until  next [place] / Other MD appointment. [place] of regression in wound condition at [phone]. o Please direct any NON-WOUND related issues/requests for orders to patient's Primary Care Physician Electronic Signature(s) Signed: 10/17/2014 1:47:03 PM By: Christin Fudge MD, FACS Signed: 10/17/2014 4:58:02 PM By: Baruch Gouty  Velva Harman BSN, RN Entered By: Regan Lemming on 10/17/2014 10:15:54 Philip [name] ([id number]) -------------------------------------------------------------------------------- Problem List Details Patient Name: CHASKA, HAGGER. Date of Service: 10/17/2014 9:30 AM Medical Record Number: [id number] Patient Account Number: [id number] Date of Birth/Sex: 1918-06-08 (81 y.o. Male) Treating RN: Primary Care Physician: Hortencia Pilar Other Clinician: Referring Physician: Hortencia Pilar Treating Physician/Extender: Frann Rider in Treatment: 24 Active Problems ICD-10 Encounter Code Description Active Date Diagnosis I70.232 Atherosclerosis of native arteries of right leg with 06/21/2014 Yes ulceration of calf I82.401 Acute embolism and thrombosis of unspecified deep veins 06/21/2014 Yes of right lower extremity S91.301A Unspecified open wound, right foot, initial encounter 07/19/2014 Yes Z92.21 Personal history of antineoplastic chemotherapy 08/23/2014 Yes L97.522 Non-pressure chronic ulcer of other part of left foot with fat 09/20/2014 Yes layer exposed S41.111A Laceration without foreign body of right upper arm, initial 10/04/2014 Yes encounter Inactive Problems Resolved Problems ICD-10 Code Description Active Date Resolved Date L97.212 Non-pressure chronic ulcer of right calf with fat layer 06/21/2014 06/21/2014 exposed S51.812A Laceration without foreign body of left forearm, initial 06/21/2014 06/21/2014 encounter Philip [name], Philip [name] ([id number]) Electronic Signature(s) Signed: 10/17/2014 1:47:03 PM By: Christin Fudge MD, FACS Entered By: Christin Fudge on  10/17/2014 10:27:54 Philip [name] ([id number]) -------------------------------------------------------------------------------- Progress Note Details Patient Name: Philip [name]. Date of Service: 10/17/2014 9:30 AM Medical Record Number: [id number] Patient Account Number: [id number] Date of Birth/Sex: 11/29/1918 (81 y.o. Male) Treating RN: Primary Care Physician: Hortencia Pilar Other Clinician: Referring Physician: Hortencia Pilar Treating Physician/Extender: Frann Rider in Treatment: 24 Subjective Chief Complaint Information obtained from Patient R foot ulcer. L forearm ulcer. 07/19/2014 -- about 2 weeks ago he had a surgical procedure done by dermatologist in [place] and has an open surgical wound on the dorsum of the right foot. History of Present Illness (HPI) The following HPI elements were documented for the patient's wound: Location: right leg Duration: Dec 2015 Modifying Factors: history of an injury to the right leg with resulting hematoma and thrombophlebitis and later an ulcer of posterior leg Associated Signs and Symptoms: marked lymphedema of the right leg. He is already on Eloquis. 06/14/14 -- He returns for followup today. He denies any fevers. no fresh issues and his daughter says he's been doing fine. 06/21/14 -- after he sustained a fall this week earlier he applied a bandage over this himself and did not seek any medical attention. he did however manage to control the bleeding and had a dressing in place the next morning when his son to the visit. In this dressing was removed there was further damaged skin. His right leg has been doing fine otherwise. 07/12/14 --Very pleasant 81 year old with past medical history significant for congestive heart failure (EF 15%), peripheral vascular disease, and chronic kidney disease. He was hospitalized at [facility] in December 2015 for congestive heart failure. He says that he fell during his hospital course and developed a  hematoma over his right calf. He was also diagnosed with a right lower extremity DVT for which he takes Eliquis. The hematoma subsequently turned into an ulceration around Christmas, which has healed. He subsequently developed an ulcer on his right dorsal foot and a traumatic left forearm ulcer. Per his report, he underwent biopsy of the right dorsal foot ulceration which demonstrated a skin cancer. His PCP and dermatologist office are both closed today. I reviewed his records in [place] but find no report of biopsy or pathology. He s without complaints today. No significant pain. No fever or chills. Minimal drainage.  07/19/2014 - the patient and his son tell me that about 2 weeks ago the dermatologist did a skin biopsy and this was a large area on the dorsum of his right foot which was left open and no dressing instructions were recommended. Since then he has been called and told that it is a cancer and the need to do a further procedure but that will not happen until about 2 weeks from now. In the meanwhile the patient has not been taking care of his right foot. The left forearm where he had an abrasion and laceration is doing very well. Philip [name], Philip [name] ([id number]) 07/26/2014 -- Reports from 07/08/2014 from the dermatology group reviewed. A excision was done of a lesion located on the dorsum of the right foot and this was 1.7 cm in diameter which was a shave biopsy performed. The wound was left open after appropriate cauterization and the patient was given this dressing instructions. 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 08/02/2014 -- all the original problems he came with have completely resolved. He now has a surgical wound on his right foot dorsum where a skin cancer was excised. He goes to see his dermatologist this coming Tuesday and will have definite news next Friday. 08/09/2014 he had gone  to his dermatologist on Tuesday and she has injected the base of his ulcer with some chemotherapeutic agent. He was supposed to bring some papers with him but forgot to get them and will bring them in next week. Other than that the dermatologist had suggested using Mehdi honey on the wound. 08/16/2014 -- the patient has brought in his notes from the dermatologist and on 08/06/2014 he received a injection of 5 FU, 500 mg grams into the lesion. the pathology report was also sent and it was a squamous cell carcinoma well-differentiated and edges were involved. They wanted him to use many honey for the wound dressing changes to be done 3 times a week. 08/30/2014 -- he has finished his second injection of 5-FU and has the next one in 2 weeks' time. He is doing fine otherwise. 09/13/2014 - No new complaints. No significant pain. No fever or chills. Minimal drainage. Still receiving 5-FU injections. 09/20/2014 -- He was seen by the dermatologist on 09/10/2014 and Dr. Phillip Heal injected his foot both the right on the dorsum and left near the medial malleolus with 5-FU. The next dose of 5-FU is to be given after 3 months. The patient says he now has a spot on the left medial malleolus where he was injected with 5-FU. 09/27/2014 -- the area on the left ankle where he was injected with 5-FU is now a full-blown ulcer. He also has mild pain in both ankle areas. 10/04/2014 - large had a bit of a fall and injured his right arm last evening and has had a laceration with no evidence of any foreign body in the right forearm. Objective Constitutional Pulse regular. Respirations normal and unlabored. Afebrile. Vitals Time Taken: 9:40 AM, Height: 69 in, Weight: 179 lbs, BMI: 26.4, Temperature: 97.7 F, Pulse: 73 bpm, Respiratory Rate: 16 breaths/min, Blood Pressure: 128/60 mmHg. Philip [name], Philip [name] ([id number]) Eyes Nonicteric. Reactive to light. Ears, Nose, Mouth, and Throat Lips, teeth, and gums WNL.Marland Kitchen Moist  mucosa without lesions . Neck supple and nontender. No palpable supraclavicular or cervical adenopathy. Normal sized without goiter. Respiratory WNL. No retractions.. Cardiovascular Pedal Pulses WNL. No clubbing, cyanosis or edema. Musculoskeletal  Adexa without tenderness or enlargement.. Digits and nails w/o clubbing, cyanosis, infection, petechiae, ischemia, or inflammatory conditions.Marland Kitchen Psychiatric Judgement and insight Intact.. No evidence of depression, anxiety, or agitation.. General Notes: The right forearm is almost completely healed and the right dorsum of the foot looks very clean. His left ankle still has some slough and needs sharp debridement. Integumentary (Hair, Skin) No suspicious lesions. No crepitus or fluctuance. No peri-wound warmth or erythema. No masses.. Wound #3 status is Open. Original cause of wound was Surgical Injury. The wound is located on the Right,Dorsal Foot. The wound measures 1.3cm length x 1.3cm width x 0.4cm depth; 1.327cm^2 area and 0.531cm^3 volume. The wound is limited to skin breakdown. There is a medium amount of serosanguineous drainage noted. The wound margin is distinct with the outline attached to the wound base. There is small (1-33%) pink granulation within the wound bed. There is a medium (34-66%) amount of necrotic tissue within the wound bed including Adherent Slough. The periwound skin appearance exhibited: Localized Edema, Moist. The periwound skin appearance did not exhibit: Callus, Crepitus, Excoriation, Fluctuance, Friable, Induration, Rash, Scarring, Dry/Scaly, Maceration, Atrophie Blanche, Cyanosis, Ecchymosis, Hemosiderin Staining, Mottled, Pallor, Rubor, Erythema. Periwound temperature was noted as No Abnormality. Wound #4 status is Open. Original cause of wound was Other Lesion. The wound is located on the Left,Medial Malleolus. The wound measures 1cm length x 1.4cm width x 0.4cm depth; 1.1cm^2 area and 0.44cm^3 volume. The wound  is limited to skin breakdown. There is no tunneling or undermining noted. There is a small amount of serosanguineous drainage noted. The wound margin is distinct with the outline attached to the wound base. There is small (1-33%) pink granulation within the wound bed. There is a medium (34-66%) amount of necrotic tissue within the wound bed including Adherent Slough. The periwound skin appearance exhibited: Localized Edema, Moist. The periwound skin appearance did not exhibit: Callus, Crepitus, Excoriation, Fluctuance, Friable, Induration, Rash, Scarring, Dry/Scaly, Maceration, Atrophie Blanche, Cyanosis, Ecchymosis, Hemosiderin Staining, Mottled, Pallor, Rubor, Erythema. Periwound temperature was noted as No Abnormality. The periwound has tenderness on palpation. Philip [name], Philip [name] ([id number]) Wound #5 status is Open. Original cause of wound was Shear/Friction. The wound is located on the Right,Lateral Forearm. The wound measures 4cm length x 2.3cm width x 0.1cm depth; 7.226cm^2 area and 0.723cm^3 volume. The wound is limited to skin breakdown. There is no tunneling or undermining noted. There is a small amount of serous drainage noted. The wound margin is distinct with the outline attached to the wound base. There is large (67-100%) red granulation within the wound bed. There is no necrotic tissue within the wound bed. The periwound skin appearance exhibited: Moist. The periwound skin appearance did not exhibit: Callus, Crepitus, Excoriation, Fluctuance, Friable, Induration, Localized Edema, Rash, Scarring, Dry/Scaly, Maceration, Atrophie Blanche, Cyanosis, Ecchymosis, Hemosiderin Staining, Mottled, Pallor, Rubor, Erythema. Periwound temperature was noted as No Abnormality. The right forearm is almost completely healed and the right dorsum of the foot looks very clean. His left ankle still has some slough and needs sharp debridement. 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 His right lower extremity will use Prisma and change it every other day and his left lower extremity we will use silver alginate. He is going to be away on vacation for a week and come back and see [place] soon after that. 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 There was a Skin/Subcutaneous Tissue Debridement ([id number]) debridement with total area of 1.69 sq cm performed by Pat Patrick., MD. with the following instrument(s): Curette to remove Non-Viable tissue/material including Exudate, Fibrin/Slough, and Subcutaneous after achieving pain control using Lidocaine 4% Topical Solution. A time out was conducted prior to the start of the procedure. A Minimum amount of bleeding was controlled with Pressure. The procedure was tolerated well with a pain level of 0 throughout and a pain level of 0 following the procedure. Post Debridement Measurements: 1.3cm length x 1.3cm width x 0.5cm depth; 0.664cm^3 Philip [name], Philip [name]. ([id number]) volume. Plan Wound Cleansing: Wound #3 Right,Dorsal Foot: Cleanse wound with mild soap and water May Shower, gently pat wound dry prior to applying new dressing. May shower with protection. Wound #4 Left,Medial Malleolus: Cleanse wound with mild soap and water May Shower, gently pat wound dry prior to applying new dressing. May shower with protection. Skin Barriers/Peri-Wound Care: Wound #3  Right,Dorsal Foot: Skin Prep Wound #4 Left,Medial Malleolus: Skin Prep Wound #5 Right,Lateral Forearm: Skin Prep Primary Wound Dressing: Wound #3 Right,Dorsal Foot: Prisma Ag Wound #4 Left,Medial Malleolus: Aquacel Ag Wound #5 Right,Lateral Forearm: Prisma Ag Other: - mepitel Secondary Dressing: Wound #3 Right,Dorsal Foot: Boardered Foam Dressing Wound #4 Left,Medial Malleolus: Boardered Foam Dressing Wound #5 Right,Lateral Forearm: Boardered Foam Dressing Dressing Change Frequency: Wound #3 Right,Dorsal Foot: Change dressing every other day. Wound #4 Left,Medial Malleolus: Change dressing every other day. Wound #5 Right,Lateral Forearm: Change dressing every other day. Follow-up Appointments: Wound #4 Left,Medial Malleolus: Return Appointment in 2 weeks. Wound #5 Right,Lateral Forearm: Philip [name], Philip [name] ([id number]) Return Appointment in 2 weeks. Home Health: Wound #3 Right,Dorsal Foot: [place] Visits - [place] Nurse may visit PRN to address patient s wound care needs. FACE TO FACE ENCOUNTER: MEDICARE and MEDICAID PATIENTS: I certify that this patient is under my care and that I had a face-to-face encounter that meets the physician face-to-face encounter requirements with this patient on this date. The encounter with the patient was in whole or in part for the following MEDICAL CONDITION: (primary reason for [place]) MEDICAL NECESSITY: I certify, that based on my findings, NURSING services are a medically necessary home health service. HOME BOUND STATUS: I certify that my clinical findings support that this patient is homebound (i.e., Due to illness or injury, pt requires aid of supportive devices such as crutches, cane, wheelchairs, walkers, the use of special transportation or the assistance of another person to leave their place of residence. There is a normal inability to leave the home and doing so requires considerable  and taxing effort. Other absences are for medical reasons / religious services and are infrequent or of short duration when for other reasons). If current dressing causes regression in wound condition, may D/C ordered dressing product/s and apply Normal Saline Moist Dressing daily until next [place] / Other MD appointment. [place] of regression in wound condition at [phone]. Please direct any NON-WOUND related issues/requests for orders to patient's Primary Care Physician Wound #4 Left,Medial  Malleolus: [place] Visits - [place] Nurse may visit PRN to address patient s wound care needs. FACE TO FACE ENCOUNTER: MEDICARE and MEDICAID PATIENTS: I certify that this patient is under my care and that I had a face-to-face encounter that meets the physician face-to-face encounter requirements with this patient on this date. The encounter with the patient was in whole or in part for the following MEDICAL CONDITION: (primary reason for [place]) MEDICAL NECESSITY: I certify, that based on my findings, NURSING services are a medically necessary home health service. HOME BOUND STATUS: I certify that my clinical findings support that this patient is homebound (i.e., Due to illness or injury, pt requires aid of supportive devices such as crutches, cane, wheelchairs, walkers, the use of special transportation or the assistance of another person to leave their place of residence. There is a normal inability to leave the home and doing so requires considerable and taxing effort. Other absences are for medical reasons / religious services and are infrequent or of short duration when for other reasons). If current dressing causes regression in wound condition, may D/C ordered dressing product/s and apply Normal Saline Moist Dressing daily until next [place] / Other MD appointment. [place] of  regression in wound condition at [phone]. Please direct any NON-WOUND related issues/requests for orders to patient's Primary Care Physician Wound #5 Right,Lateral Forearm: [place] Nurse may visit PRN to address patient s wound care needs. FACE TO FACE ENCOUNTER: MEDICARE and MEDICAID PATIENTS: I certify that this patient is under my care and that I had a face-to-face encounter that meets the physician face-to-face encounter requirements with this patient on this date. The encounter with the patient was in whole or in part for the following MEDICAL CONDITION: (primary reason for [place]) MEDICAL NECESSITY: I certify, that based on my findings, NURSING services are a medically necessary home health service. HOME BOUND STATUS: I certify that my clinical findings support that this patient is homebound (i.e., Due to illness or injury, pt requires aid of supportive devices such as crutches, cane, wheelchairs, walkers, the use of special transportation or the assistance of another person to leave their place of residence. There is a normal inability to leave the home and doing so requires considerable and taxing effort. Other absences are for medical reasons / religious services and are infrequent or of short duration when for other reasons). If current dressing causes regression in wound condition, may D/C ordered dressing product/s and apply Normal Saline Moist Dressing daily until next [place] / Other MD appointment. Notify Wound Philip [name], ALMOND ([id number]) [place] of regression in wound condition at [phone]. Please direct any NON-WOUND related issues/requests for orders to patient's Primary Care Physician We will use a piece of Prisma and Mepitel on his right forearm and cover this for a week. His right lower extremity will use Prisma and change it every other day and his left lower extremity we will use  silver alginate. He is going to be away on vacation for a week and come back and see [place] soon after that. Electronic Signature(s) Signed: 10/17/2014 1:47:03 PM By: Christin Fudge MD, FACS Entered By: Christin Fudge on 10/17/2014 10:30:24 Philip [name] ([id number]) -------------------------------------------------------------------------------- SuperBill Details Patient Name: REFUGIO, VANDEVOORDE. Date of Service: 10/17/2014 Medical Record Number: [id number] Patient Account Number: [id number] Date of Birth/Sex: [birth year] (81 y.o. 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 S41.111A Laceration without foreign body of right upper arm, initial encounter Facility Procedures The patient participates with Medicare or their insurance follows the Medicare Facility Guidelines: CPT4 Code Description Modifier Quantity [phone] 11042 - DEB SUBQ TISSUE 20 SQ CM/< 1 ICD-10 Description Diagnosis S91.301A Unspecified open wound, right  foot, initial encounter Z92.21 Personal history of antineoplastic chemotherapy Physician Procedures CPT4 Code: [id number] Description: 63335 - WC PHYS SUBQ TISS 20 SQ CM ICD-10 Description Diagnosis S91.301A Unspecified open wound, right foot, initial encounter Z92.21 Personal history of antineoplastic chemotherapy Modifier: Quantity: 1 Electronic Signature(s) Signed: 10/17/2014 1:47:03 PM By: Christin Fudge MD, FACS Entered By: Christin Fudge on 10/17/2014 10:30:42

## 2014-10-18 NOTE — Progress Notes (Signed)
SAVAUGHN, KARWOWSKI ([id number]) Visit Report for 10/17/2014 Arrival Information Details Patient Name: Philip [name], Philip [name]. Date of Service: 10/17/2014 9:30 AM Medical Record Number: [id number] Patient Account Number: [id number] Date of Birth/Sex: 1918-05-03 (81 y.o. Male) Treating RN: Baruch Gouty, RN, BSN, Velva Harman Primary Care Physician: Hortencia Pilar Other Clinician: Referring Physician: Hortencia Pilar Treating Physician/Extender: Frann Rider in Treatment: 24 Visit Information History Since Last Visit Any new allergies or adverse reactions: No Patient Arrived: Crutches Had a fall or experienced change in No Arrival Time: 09:38 activities of daily living that may affect Accompanied By: son risk of falls: Transfer Assistance: None Signs or symptoms of abuse/neglect since last No Patient Identification Verified: Yes visito Secondary Verification Process Yes Has Dressing in Place as Prescribed: Yes Completed: Pain Present Now: No Patient Requires Transmission- No Based Precautions: Patient Has Alerts: Yes Patient Alerts: Patient on Blood Thinner Electronic Signature(s) Signed: 10/17/2014 4:58:02 PM By: Regan Lemming BSN, RN Entered By: Regan Lemming on 10/17/2014 09:39:11 Philip [name] ([id number]) -------------------------------------------------------------------------------- Encounter Discharge Information Details Patient Name: Philip [name], Philip [name]. Date of Service: 10/17/2014 9:30 AM Medical Record Number: [id number] Patient Account Number: [id number] Date of Birth/Sex: December 25, 1918 (81 y.o. Male) Treating RN: Baruch Gouty, RN, BSN, Velva Harman Primary Care Physician: Hortencia Pilar Other Clinician: Referring Physician: Hortencia Pilar Treating Physician/Extender: Frann Rider in Treatment: 24 Encounter Discharge Information Items Discharge Pain Level: 0 Discharge Condition: Stable Ambulatory Status: Cane Discharge Destination: Home Private Transportation: Auto Accompanied By:  son Schedule Follow-up Appointment: No Medication Reconciliation completed and No provided to Patient/Care Larron Armor: Patient Clinical Summary of Care: Declined Electronic Signature(s) Signed: 10/17/2014 10:42:28 AM By: Ruthine Dose Entered By: Ruthine Dose on 10/17/2014 10:42:28 Philip [name] ([id number]) -------------------------------------------------------------------------------- Lower Extremity Assessment Details Patient Name: Philip [name], Philip [name]. Date of Service: 10/17/2014 9:30 AM Medical Record Number: [id number] Patient Account Number: [id number] Date of Birth/Sex: 1918/09/12 (81 y.o. Male) Treating RN: Baruch Gouty, RN, BSN, Velva Harman Primary Care Physician: Hortencia Pilar Other Clinician: Referring Physician: Hortencia Pilar Treating Physician/Extender: Frann Rider in Treatment: 24 Vascular Assessment Pulses: Posterior Tibial Palpable: [Left:No] [Right:No] Doppler: [Left:Monophasic] [Right:Monophasic] Dorsalis Pedis Palpable: [Left:No] [Right:No] Doppler: [Left:Monophasic] [Right:Monophasic] Extremity colors, hair growth, and conditions: Extremity Color: [Left:Mottled] [Right:Mottled] Hair Growth on Extremity: [Left:No] [Right:No] Temperature of Extremity: [Left:Warm] [Right:Warm] Capillary Refill: [Left:< 3 seconds] [Right:< 3 seconds] Dependent Rubor: [Left:No] [Right:No] Blanched when Elevated: [Left:No] [Right:No] Lipodermatosclerosis: [Left:No] [Right:No] Toe Nail Assessment Left: Right: Thick: Yes Yes Discolored: Yes Yes Deformed: No No Improper Length and Hygiene: No No Electronic Signature(s) Signed: 10/17/2014 4:58:02 PM By: Regan Lemming BSN, RN Entered By: Regan Lemming on 10/17/2014 09:48:14 Philip [name] ([id number]) -------------------------------------------------------------------------------- Multi Wound Chart Details Patient Name: Philip [name]. Date of Service: 10/17/2014 9:30 AM Medical Record Number: [id number] Patient Account Number:  [id number] Date of Birth/Sex: [birth year] (81 y.o. 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] [4:Malignant Wound] [5:Skin Tear] Comorbid History: [3:Cataracts, Arrhythmia, Congestive Heart Failure] [4:Cataracts, Arrhythmia, Congestive Heart Failure] [5:Cataracts, Arrhythmia, Congestive Heart Failure] Date Acquired: [3:07/01/2014] [4:09/20/2014] [5:10/01/2014] Weeks of Treatment: [3:13] [4:3] [5:1] Wound Status: [3:Open] [4:Open] [5:Open] Measurements L x  W x D 1.3x1.3x0.4 [4:1x1.4x0.4] [5:4x2.3x0.1] (cm) Area (cm) : [3:1.327] [4:1.1] [5:7.226] Volume (cm) : [3:0.531] [4:0.44] [5:0.723] % Reduction in Area: [3:29.60%] [4:-600.60%] [5:-15.00%] % Reduction in Volume: 29.60% [4:-2650.00%] [5:-15.10%] Classification: [3:Full Thickness Without Exposed Support Structures] [4:Partial Thickness] [5:Partial Thickness] Exudate Amount: [3:Medium] [4:Small] [5:Small] Exudate Type: [3:Serosanguineous] [4:Serosanguineous] [5:Serous] Exudate Color: [3:red, brown] [4:red, brown] [5:amber] Wound Margin: [3:Distinct, outline attached] [4:Distinct, outline attached] [5:Distinct, outline attached] Granulation Amount: [3:Small (1-33%)] [4:Small (1-33%)] [5:Large (67-100%)] Granulation Quality: [3:Pink] [4:Pink] [5:Red] Necrotic Amount: [3:Medium (34-66%)] [4:Medium (34-66%)] [5:None Present (0%)] Exposed Structures: [3:Fascia:  No Fat: No Tendon: No Muscle: No Joint: No] [4:Fascia: No Fat: No Tendon: No Muscle: No Joint: No] [5:Fascia: No Fat: No Tendon: No Muscle: No Joint: No] Bone: No Bone: No Bone: No Limited to Skin Limited to Skin Limited to Skin Breakdown Breakdown Breakdown Epithelialization: None None Large (67-100%) Periwound Skin Texture: Edema: Yes Edema: Yes Edema: No Excoriation: No Excoriation: No Excoriation: No Induration: No Induration: No Induration: No Callus: No Callus: No Callus: No Crepitus: No Crepitus: No Crepitus: No Fluctuance: No Fluctuance: No Fluctuance: No Friable: No Friable: No Friable: No Rash: No Rash: No Rash: No Scarring: No Scarring: No Scarring: No Periwound Skin Moist: Yes Moist: Yes Moist: Yes Moisture: Maceration: No Maceration: No Maceration: No Dry/Scaly: No Dry/Scaly: No Dry/Scaly: No Periwound Skin Color: Atrophie Blanche: No Atrophie Blanche: No Atrophie Blanche: No Cyanosis: No Cyanosis: No Cyanosis: No Ecchymosis: No Ecchymosis: No Ecchymosis: No Erythema: No Erythema: No Erythema: No Hemosiderin Staining: No Hemosiderin Staining: No Hemosiderin Staining: No Mottled: No Mottled: No Mottled: No Pallor: No Pallor: No Pallor: No Rubor: No Rubor: No Rubor: No Temperature: No Abnormality No Abnormality No Abnormality Tenderness on No Yes No Palpation: Wound Preparation: Ulcer Cleansing: Ulcer Cleansing: Ulcer Cleansing: Rinsed/Irrigated with Rinsed/Irrigated with Rinsed/Irrigated with Saline Saline Saline Topical Anesthetic Topical Anesthetic Topical Anesthetic Applied: Other: lidocaine Applied: Other: lidocaine Applied: None 4% 4% Treatment Notes Electronic Signature(s) Signed: 10/17/2014 4:58:02 PM By: Regan Lemming BSN, RN Entered By: Regan Lemming on 10/17/2014 10:12:25 Philip [name] ([id number]) -------------------------------------------------------------------------------- [place]  Details Patient Name: Philip [name], Philip [name]. Date of Service: 10/17/2014 9:30 AM Medical Record Number: [id number] Patient Account Number: [id number] Date of Birth/Sex: February 22, 1919 (81 y.o. Male) Treating RN: Baruch Gouty, RN, BSN, Velva Harman Primary Care Physician: Hortencia Pilar Other Clinician: Referring Physician: Hortencia Pilar Treating Physician/Extender: Frann Rider in Treatment: 39 Active Inactive Abuse / Safety / Falls / Self Care Management Nursing Diagnoses: Abuse or neglect; actual or potential Potential for falls Goals: Patient will remain injury free Date Initiated: 05/02/2014 Goal Status: Active Interventions: Assess fall risk on admission and as needed Notes: Necrotic Tissue Nursing Diagnoses: Impaired tissue integrity related to necrotic/devitalized tissue Goals: Necrotic/devitalized tissue will be minimized in the wound bed Date Initiated: 05/02/2014 Goal Status: Active Interventions: Assess patient pain level pre-, during and post procedure and prior to discharge Treatment Activities: Apply topical anesthetic as ordered : 10/17/2014 Notes: Orientation to the Wound Care Program Nursing Diagnoses: Knowledge deficit related to the wound healing center program Philip [name], Philip [name] ([id number]) Goals: Patient/caregiver will verbalize understanding of the [facility] Program Date Initiated: 05/02/2014 Goal Status: Active Interventions: Provide education on orientation to the wound center Notes: Electronic Signature(s) Signed: 10/17/2014 4:58:02 PM By: Regan Lemming BSN, RN Entered By: Regan Lemming on 10/17/2014 10:11:47 Philip [name] ([id number]) -------------------------------------------------------------------------------- Pain Assessment Details Patient Name: Philip [name]. Date of Service: 10/17/2014 9:30 AM Medical Record Number: [id number] Patient Account Number: [id number]  Date of Birth/Sex: [birth year] (81 y.o. Male) Treating RN: Baruch Gouty, RN, BSN,  Velva Harman Primary Care Physician: Hortencia Pilar Other Clinician: Referring Physician: Hortencia Pilar Treating Physician/Extender: Frann Rider in Treatment: 24 Active Problems Location of Pain Severity and Description of Pain Patient Has Paino No Site Locations Pain Management and Medication Current Pain Management: Electronic Signature(s) Signed: 10/17/2014 4:58:02 PM By: Regan Lemming BSN, RN Entered By: Regan Lemming on 10/17/2014 09:39:16 Philip [name] ([id number]) -------------------------------------------------------------------------------- Patient/Caregiver Education Details Patient Name: NYLES, MITTON. Date of Service: 10/17/2014 9:30 AM Medical Record Number: [id number] Patient Account Number: [id number] Date of Birth/Gender: Feb 03, 1919 (81 y.o. Male) Treating RN: Baruch Gouty, RN, BSN, Velva Harman Primary Care Physician: Hortencia Pilar Other Clinician: Referring Physician: Hortencia Pilar Treating Physician/Extender: Frann Rider in Treatment: 24 Education Assessment Education Provided To: Patient Education Topics Provided Basic Hygiene: Methods: Explain/Verbal Responses: State content correctly Welcome To [facility]: Methods: Explain/Verbal Responses: State content correctly Electronic Signature(s) Signed: 10/17/2014 4:58:02 PM By: Regan Lemming BSN, RN Entered By: Regan Lemming on 10/17/2014 10:18:45 Philip [name] ([id number]) -------------------------------------------------------------------------------- Wound Assessment Details Patient Name: Philip [name], Philip [name]. Date of Service: 10/17/2014 9:30 AM Medical Record Number: [id number] Patient Account Number: [id number] Date of Birth/Sex: 1919/04/04 (81 y.o. Male) Treating RN: Baruch Gouty, RN, BSN, [place] Primary Care Physician: Hortencia Pilar Other Clinician: Referring Physician: Hortencia Pilar Treating Physician/Extender: Frann Rider in Treatment: 24 Wound Status Wound Number: 3 Primary Malignant  Wound Etiology: Wound Location: Right Foot - Dorsal Wound Status: Open Wounding Event: Surgical Injury Comorbid Cataracts, Arrhythmia, Congestive Date Acquired: 07/01/2014 History: Heart Failure Weeks Of Treatment: 13 Clustered Wound: No Photos Photo Uploaded By: Regan Lemming on 10/17/2014 16:57:46 Wound Measurements Length: (cm) 1.3 Width: (cm) 1.3 Depth: (cm) 0.4 Area: (cm) 1.327 Volume: (cm) 0.531 % Reduction in Area: 29.6% % Reduction in Volume: 29.6% Epithelialization: None Wound Description Full Thickness Without Exposed Classification: Support Structures Wound Margin: Distinct, outline attached Exudate Medium Amount: Exudate Type: Serosanguineous Exudate Color: red, brown Foul Odor After Cleansing: No Wound Bed Granulation Amount: Small (1-33%) Exposed Structure Granulation Quality: Pink Fascia Exposed: No Necrotic Amount: Medium (34-66%) Fat Layer Exposed: No DODGE, ATOR ([id number]) Necrotic Quality: Adherent Slough Tendon Exposed: No Muscle Exposed: No Joint Exposed: No Bone Exposed: No Limited to Skin Breakdown Periwound Skin Texture Texture Color No Abnormalities Noted: No No Abnormalities Noted: No Callus: No Atrophie Blanche: No Crepitus: No Cyanosis: No Excoriation: No Ecchymosis: No Fluctuance: No Erythema: No Friable: No Hemosiderin Staining: No Induration: No Mottled: No Localized Edema: Yes Pallor: No Rash: No Rubor: No Scarring: No Temperature / Pain Moisture Temperature: No Abnormality No Abnormalities Noted: No Dry / Scaly: No Maceration: No Moist: Yes Wound Preparation Ulcer Cleansing: Rinsed/Irrigated with Saline Topical Anesthetic Applied: Other: lidocaine 4%, Treatment Notes Wound #3 (Right, Dorsal Foot) 1. Cleansed with: Clean wound with Normal Saline 3. Peri-wound Care: Skin Prep 4. Dressing Applied: Prisma Ag 5. Secondary Dressing Applied Bordered Foam Dressing Electronic Signature(s) Signed:  10/17/2014 4:58:02 PM By: Regan Lemming BSN, RN Entered By: Regan Lemming on 10/17/2014 09:52:19 CHLOE, BAIG ([id number]) -------------------------------------------------------------------------------- Wound Assessment Details Patient Name: Philip [name], Philip [name]. Date of Service: 10/17/2014 9:30 AM Medical Record Number: [id number] Patient Account Number: [id number] Date of Birth/Sex: November 20, 1918 (81 y.o. Male) Treating RN: Baruch Gouty, RN, BSN, Velva Harman Primary Care Physician: Hortencia Pilar Other Clinician: Referring Physician: Hortencia Pilar Treating Physician/Extender: Frann Rider in Treatment: 24 Wound Status Wound Number: 4 Primary Malignant Wound Etiology: Wound Location: Left Malleolus - Medial Wound  Status: Open Wounding Event: Other Lesion Comorbid Cataracts, Arrhythmia, Congestive Date Acquired: 09/20/2014 History: Heart Failure Weeks Of Treatment: 3 Clustered Wound: No Photos Photo Uploaded By: Regan Lemming on 10/17/2014 16:57:47 Wound Measurements Length: (cm) 1 Width: (cm) 1.4 Depth: (cm) 0.4 Area: (cm) 1.1 Volume: (cm) 0.44 % Reduction in Area: -600.6% % Reduction in Volume: -2650% Epithelialization: None Tunneling: No Undermining: No Wound Description Classification: Partial Thickness Wound Margin: Distinct, outline attached Exudate Amount: Small Exudate Type: Serosanguineous Exudate Color: red, brown Foul Odor After Cleansing: No Wound Bed Granulation Amount: Small (1-33%) Exposed Structure Granulation Quality: Pink Fascia Exposed: No Necrotic Amount: Medium (34-66%) Fat Layer Exposed: No Necrotic Quality: Adherent Slough Tendon Exposed: No Philip [name], Philip [name] ([id number]) Muscle Exposed: No Joint Exposed: No Bone Exposed: No Limited to Skin Breakdown Periwound Skin Texture Texture Color No Abnormalities Noted: No No Abnormalities Noted: No Callus: No Atrophie Blanche: No Crepitus: No Cyanosis: No Excoriation: No Ecchymosis: No Fluctuance:  No Erythema: No Friable: No Hemosiderin Staining: No Induration: No Mottled: No Localized Edema: Yes Pallor: No Rash: No Rubor: No Scarring: No Temperature / Pain Moisture Temperature: No Abnormality No Abnormalities Noted: No Tenderness on Palpation: Yes Dry / Scaly: No Maceration: No Moist: Yes Wound Preparation Ulcer Cleansing: Rinsed/Irrigated with Saline Topical Anesthetic Applied: Other: lidocaine 4%, Treatment Notes Wound #4 (Left, Medial Malleolus) 1. Cleansed with: Clean wound with Normal Saline 3. Peri-wound Care: Skin Prep 4. Dressing Applied: Aquacel Ag 5. Secondary Dressing Applied Bordered Foam Dressing Electronic Signature(s) Signed: 10/17/2014 4:58:02 PM By: Regan Lemming BSN, RN Entered By: Regan Lemming on 10/17/2014 09:53:33 Philip [name] ([id number]) -------------------------------------------------------------------------------- Wound Assessment Details Patient Name: Philip [name], Philip [name]. Date of Service: 10/17/2014 9:30 AM Medical Record Number: [id number] Patient Account Number: [id number] Date of Birth/Sex: 03-04-1919 (81 y.o. Male) Treating RN: Baruch Gouty, RN, BSN, [place] Primary Care Physician: Hortencia Pilar Other Clinician: Referring Physician: Hortencia Pilar Treating Physician/Extender: Frann Rider in Treatment: 24 Wound Status Wound Number: 5 Primary Skin Tear Etiology: Wound Location: Right Forearm - Lateral Wound Status: Open Wounding Event: Shear/Friction Comorbid Cataracts, Arrhythmia, Congestive Date Acquired: 10/01/2014 History: Heart Failure Weeks Of Treatment: 1 Clustered Wound: No Photos Photo Uploaded By: Regan Lemming on 10/17/2014 16:57:47 Wound Measurements Length: (cm) 4 Width: (cm) 2.3 Depth: (cm) 0.1 Area: (cm) 7.226 Volume: (cm) 0.723 % Reduction in Area: -15% % Reduction in Volume: -15.1% Epithelialization: Large (67-100%) Tunneling: No Undermining: No Wound Description Classification: Partial  Thickness Wound Margin: Distinct, outline attached Exudate Amount: Small Exudate Type: Serous Exudate Color: amber Foul Odor After Cleansing: No Wound Bed Granulation Amount: Large (67-100%) Exposed Structure Granulation Quality: Red Fascia Exposed: No Necrotic Amount: None Present (0%) Fat Layer Exposed: No Tendon Exposed: No Philip [name], Philip [name] ([id number]) Muscle Exposed: No Joint Exposed: No Bone Exposed: No Limited to Skin Breakdown Periwound Skin Texture Texture Color No Abnormalities Noted: No No Abnormalities Noted: No Callus: No Atrophie Blanche: No Crepitus: No Cyanosis: No Excoriation: No Ecchymosis: No Fluctuance: No Erythema: No Friable: No Hemosiderin Staining: No Induration: No Mottled: No Localized Edema: No Pallor: No Rash: No Rubor: No Scarring: No Temperature / Pain Moisture Temperature: No Abnormality No Abnormalities Noted: No Dry / Scaly: No Maceration: No Moist: Yes Wound Preparation Ulcer Cleansing: Rinsed/Irrigated with Saline Topical Anesthetic Applied: None Treatment Notes Wound #5 (Right, Lateral Forearm) 1. Cleansed with: Clean wound with Normal Saline 3. Peri-wound Care: Skin Prep 4. Dressing Applied: Prisma Ag 5. Secondary Dressing Applied Bordered Foam Dressing Electronic Signature(s) Signed: 10/17/2014 4:58:02 PM By:  [employer], [employer], RN Entered By: Regan Lemming on 10/17/2014 09:54:01 Philip [name] ([id number]) -------------------------------------------------------------------------------- [place] Details Patient Name: AMAREON, PHUNG. Date of Service: 10/17/2014 9:30 AM Medical Record Number: [id number] Patient Account Number: [id number] Date of Birth/Sex: 1918-07-21 (81 y.o. Male) Treating RN: Baruch Gouty, RN, BSN, [place] Primary Care Physician: Hortencia Pilar Other Clinician: Referring Physician: Hortencia Pilar Treating Physician/Extender: Frann Rider in Treatment: 24 Vital Signs Time Taken: 09:40 Temperature  (F): 97.7 Height (in): 69 Pulse (bpm): 73 Weight (lbs): 179 Respiratory Rate (breaths/min): 16 Body Mass Index (BMI): 26.4 Blood Pressure (mmHg): 128/60 Reference Range: 80 - 120 mg / dl Electronic Signature(s) Signed: 10/17/2014 4:58:02 PM By: Regan Lemming BSN, RN Entered By: Regan Lemming on 10/17/2014 09:40:09

## 2014-10-28 ENCOUNTER — Encounter: Payer: Medicare Other | Attending: Surgery | Admitting: Surgery

## 2014-10-28 DIAGNOSIS — I739 Peripheral vascular disease, unspecified: Secondary | ICD-10-CM | POA: Diagnosis not present

## 2014-10-28 DIAGNOSIS — I509 Heart failure, unspecified: Secondary | ICD-10-CM | POA: Diagnosis not present

## 2014-10-28 DIAGNOSIS — L97521 Non-pressure chronic ulcer of other part of left foot limited to breakdown of skin: Secondary | ICD-10-CM | POA: Insufficient documentation

## 2014-10-28 DIAGNOSIS — Z9221 Personal history of antineoplastic chemotherapy: Secondary | ICD-10-CM | POA: Insufficient documentation

## 2014-10-28 DIAGNOSIS — R600 Localized edema: Secondary | ICD-10-CM | POA: Insufficient documentation

## 2014-10-28 DIAGNOSIS — L97511 Non-pressure chronic ulcer of other part of right foot limited to breakdown of skin: Secondary | ICD-10-CM | POA: Insufficient documentation

## 2014-10-28 DIAGNOSIS — N189 Chronic kidney disease, unspecified: Secondary | ICD-10-CM | POA: Insufficient documentation

## 2014-10-28 DIAGNOSIS — I70232 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: Secondary | ICD-10-CM | POA: Insufficient documentation
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
## 2014-10-29 NOTE — Progress Notes (Signed)
TIMTOHY, BROSKI ([id number]) Visit Report for 10/28/2014 Chief Complaint Document Details Patient Name: Philip [name], Philip [name]. Date of Service: 10/28/2014 9:30 AM Medical Record Number: [id number] Patient Account Number: [id number] Date of Birth/Sex: [birth year] (81 y.o. Male) Treating RN: Primary Care Physician: Hortencia Pilar Other Clinician: Referring Physician: Hortencia Pilar Treating Physician/Extender: Frann Rider in Treatment: 25 Information Obtained from: Patient Chief Complaint R foot ulcer. L forearm ulcer. 07/19/2014 -- about 2 weeks ago he had a surgical procedure done by dermatologist in [place] and has an open surgical wound on the dorsum of the right foot. Electronic Signature(s) Signed: 10/28/2014 10:22:51 AM By: Christin Fudge MD, FACS Entered By: Christin Fudge on 10/28/2014 10:22:50 Philip [name] ([id number]) -------------------------------------------------------------------------------- Debridement Details Patient Name: Philip [name]. Date of Service: 10/28/2014 9:30 AM Medical Record Number: [id number] Patient Account Number: [id number] Date of Birth/Sex: [birth year] (81 y.o. Male) Treating RN: Primary Care Physician: Hortencia Pilar Other Clinician: Referring Physician: Hortencia Pilar Treating Physician/Extender: Frann Rider in Treatment: 25 Debridement Performed for Wound #3 Right,Dorsal Foot Assessment: Performed By: Physician Pat Patrick., MD Debridement: Open Wound/Selective Debridement Selective Description: Pre-procedure Yes Verification/Time Out Taken: Start Time: 10:03 Pain Control: Lidocaine 5% topical ointment Level: Non-Viable Tissue Total Area Debrided (L x 1 (cm) x 0.8 (cm) = 0.8 (cm) W): Instrument: Forceps Bleeding: None End Time: 10:12 Procedural Pain: 0 Post Procedural Pain: 0 Response to Treatment: Procedure was tolerated well Post Debridement Measurements of Total Wound Length: (cm) 1 Width: (cm) 0.8 Depth: (cm)  0.3 Volume: (cm) 0.188 Electronic Signature(s) Signed: 10/28/2014 10:20:50 AM By: Christin Fudge MD, FACS Entered By: Christin Fudge on 10/28/2014 10:20:50 Philip [name] ([id number]) -------------------------------------------------------------------------------- Debridement Details Patient Name: Philip [name]. Date of Service: 10/28/2014 9:30 AM Medical Record Number: [id number] Patient Account Number: [id number] Date of Birth/Sex: Apr 22, 1918 (81 y.o. Male) Treating RN: Primary Care Physician: Hortencia Pilar Other Clinician: Referring Physician: Hortencia Pilar Treating Physician/Extender: Frann Rider in Treatment: 25 Debridement Performed for Wound #4 Left,Medial Malleolus Assessment: Performed By: Physician Pat Patrick., MD Debridement: Debridement Pre-procedure Yes Verification/Time Out Taken: Start Time: 10:03 Pain Control: Lidocaine 5% topical ointment Level: Skin/Subcutaneous Tissue Total Area Debrided (L x 1.2 (cm) x 1.5 (cm) = 1.8 (cm) W): Tissue and other Viable, Non-Viable, Eschar, Fibrin/Slough, Subcutaneous material debrided: Instrument: Forceps Bleeding: Minimum Hemostasis Achieved: Pressure End Time: 10:05 Procedural Pain: 3 Post Procedural Pain: 0 Response to Treatment: Procedure was tolerated well Post Debridement Measurements of Total Wound Length: (cm) 1.2 Width: (cm) 1.5 Depth: (cm) 0.3 Volume: (cm) 0.424 Electronic Signature(s) Signed: 10/28/2014 10:21:43 AM By: Christin Fudge MD, FACS Entered By: Christin Fudge on 10/28/2014 10:21:43 Philip [name] ([id number]) -------------------------------------------------------------------------------- Debridement Details Patient Name: Philip [name]. Date of Service: 10/28/2014 9:30 AM Medical Record Number: [id number] Patient Account Number: [id number] Date of Birth/Sex: [birth year] (81 y.o. Male) Treating RN: Primary Care Physician: Hortencia Pilar Other Clinician: Referring Physician:  Hortencia Pilar Treating Physician/Extender: Frann Rider in Treatment: 25 Debridement Performed for Wound #5 Right,Lateral Forearm Assessment: Performed By: Physician Pat Patrick., MD Debridement: Open Wound/Selective Debridement Selective Description: Pre-procedure Yes Verification/Time Out Taken: Start Time: 10:03 Pain Control: Lidocaine 5% topical ointment Level: Non-Viable Tissue Total Area Debrided (L x 3 (cm) x 3.2 (cm) = 9.6 (cm) W): Tissue and other Non-Viable, Eschar material debrided: Instrument: Forceps Bleeding: None Hemostasis Achieved: Pressure End Time: 10:05 Procedural Pain: 0 Post Procedural Pain: 0 Response to Treatment: Procedure was tolerated well Post Debridement Measurements of Total Wound Length: (cm)  0 Width: (cm) 0 Depth: (cm) 0 Volume: (cm) 0 Electronic Signature(s) Signed: 10/28/2014 10:22:44 AM By: Christin Fudge MD, FACS Entered By: Christin Fudge on 10/28/2014 10:22:43 Philip [name] ([id number]) -------------------------------------------------------------------------------- HPI Details Patient Name: Philip, [name]. Date of Service: 10/28/2014 9:30 AM Medical Record Number: [id number] Patient Account Number: [id number] Date of Birth/Sex: [birth year] (81 y.o. Male) Treating RN: Primary Care Physician: Hortencia Pilar Other Clinician: Referring Physician: Hortencia Pilar Treating Physician/Extender: Frann Rider in Treatment: 25 History of Present Illness Location: right leg Duration: Dec 2015 Modifying Factors: history of an injury to the right leg with resulting hematoma and thrombophlebitis and later an ulcer of posterior leg Associated Signs and Symptoms: marked lymphedema of the right leg. He is already on Eloquis. HPI Description: 06/14/14 -- He returns for followup today. He denies any fevers. no fresh issues and his daughter says he's been doing fine. 06/21/14 -- after he sustained a fall this week earlier he  applied a bandage over this himself and did not seek any medical attention. he did however manage to control the bleeding and had a dressing in place the next morning when his son to the visit. In this dressing was removed there was further damaged skin. His right leg has been doing fine otherwise. 07/12/14 --Very pleasant 81 year old with past medical history significant for congestive heart failure (EF 15%), peripheral vascular disease, and chronic kidney disease. He was hospitalized at [facility] in December 2015 for congestive heart failure. He says that he fell during his hospital course and developed a hematoma over his right calf. He was also diagnosed with a right lower extremity DVT for which he takes Eliquis. The hematoma subsequently turned into an ulceration around Christmas, which has healed. He subsequently developed an ulcer on his right dorsal foot and a traumatic left forearm ulcer. Per his report, he underwent biopsy of the right dorsal foot ulceration which demonstrated a skin cancer. His PCP and dermatologist office are both closed today. I reviewed his records in [place] but find no report of biopsy or pathology. He s without complaints today. No significant pain. No fever or chills. Minimal drainage. 07/19/2014 - the patient and his son tell me that about 2 weeks ago the dermatologist did a skin biopsy and this was a large area on the dorsum of his right foot which was left open and no dressing instructions were recommended. Since then he has been called and told that it is a cancer and the need to do a further procedure but that will not happen until about 2 weeks from now. In the meanwhile the patient has not been taking care of his right foot. The left forearm where he had an abrasion and laceration is doing very well. 07/26/2014 -- Reports from 07/08/2014 from the dermatology group reviewed. A excision was done of a lesion located on the dorsum of the right foot and this was 1.7  cm in diameter which was a shave biopsy performed. The wound was left open after appropriate cauterization and the patient was given this dressing instructions. 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 08/02/2014 -- all the original problems he came with have completely resolved. He now has a surgical wound on his right foot dorsum where a skin cancer was excised. He goes to see his dermatologist this RUDDY, SWIRE ([id number]) coming Tuesday and will have definite news next Friday. 08/09/2014 he had gone to his  dermatologist on Tuesday and she has injected the base of his ulcer with some chemotherapeutic agent. He was supposed to bring some papers with him but forgot to get them and will bring them in next week. Other than that the dermatologist had suggested using Mehdi honey on the wound. 08/16/2014 -- the patient has brought in his notes from the dermatologist and on 08/06/2014 he received a injection of 5 FU, 500 mg grams into the lesion. the pathology report was also sent and it was a squamous cell carcinoma well-differentiated and edges were involved. They wanted him to use many honey for the wound dressing changes to be done 3 times a week. 08/30/2014 -- he has finished his second injection of 5-FU and has the next one in 2 weeks' time. He is doing fine otherwise. 09/13/2014 - No new complaints. No significant pain. No fever or chills. Minimal drainage. Still receiving 5-FU injections. 09/20/2014 -- He was seen by the dermatologist on 09/10/2014 and Dr. Phillip Heal injected his foot both the right on the dorsum and left near the medial malleolus with 5-FU. The next dose of 5-FU is to be given after 3 months. The patient says he now has a spot on the left medial malleolus where he was injected with 5-FU. 09/27/2014 -- the area on the left ankle where he was injected with 5-FU is now a full-blown  ulcer. He also has mild pain in both ankle areas. 10/04/2014 - large had a bit of a fall and injured his right arm last evening and has had a laceration with no evidence of any foreign body in the right forearm. Electronic Signature(s) Signed: 10/28/2014 10:22:58 AM By: Christin Fudge MD, FACS Entered By: Christin Fudge on 10/28/2014 10:22:57 Philip [name] ([id number]) -------------------------------------------------------------------------------- Physical Exam Details Patient Name: ABDULAHAD, MEDEROS. Date of Service: 10/28/2014 9:30 AM Medical Record Number: [id number] Patient Account Number: [id number] Date of Birth/Sex: June 05, 1918 (81 y.o. Male) Treating RN: Primary Care Physician: Hortencia Pilar Other Clinician: Referring Physician: Hortencia Pilar Treating Physician/Extender: Frann Rider in Treatment: 25 Constitutional . Pulse regular. Respirations normal and unlabored. Afebrile. . Eyes Nonicteric. Reactive to light. Ears, Nose, Mouth, and Throat Lips, teeth, and gums WNL.Marland Kitchen Moist mucosa without lesions . Neck supple and nontender. No palpable supraclavicular or cervical adenopathy. Normal sized without goiter. Respiratory WNL. No retractions.. Cardiovascular Pedal Pulses WNL. No clubbing, cyanosis or edema. Gastrointestinal (GI) Abdomen without masses or tenderness.. No liver or spleen enlargement or tenderness.. Genitourinary (GU) No hydrocele, spermatocele, tenderness of the cord, or testicular mass.Marland Kitchen Penis without lesions.Lowella Fairy without lesions. No cystocele, or rectocele. Pelvic support intact, no discharge. Marland Kitchen Urethra without masses, tenderness or scarring.Marland Kitchen Lymphatic No adneopathy. No adenopathy. No adenopathy. Musculoskeletal Adexa without tenderness or enlargement.. Digits and nails w/o clubbing, cyanosis, infection, petechiae, ischemia, or inflammatory conditions.. Integumentary (Hair, Skin) No suspicious lesions. No crepitus or fluctuance. No  peri-wound warmth or erythema. No masses.Marland Kitchen Psychiatric Judgement and insight Intact.. No evidence of depression, anxiety, or agitation.. Notes The right forearm wound is completely healed. the right dorsum of the foot has a clean base and good epithelialization and we will continue with Prisma. The left ankle needs debridement and has some slough at the base. Electronic Signature(s) Signed: 10/28/2014 10:23:45 AM By: Christin Fudge MD, FACS BRENNON, OTTERNESS ([id number]) Entered By: Christin Fudge on 10/28/2014 10:23:44 Philip [name] ([id number]) -------------------------------------------------------------------------------- Physician Orders Details Patient Name: BEXLEY, MCLESTER. Date of Service: 10/28/2014 9:30 AM Medical Record Number: [id number] Patient Account Number: [id number]  Date of Birth/Sex: 1919/03/26 (81 y.o. Male) Treating RN: Cornell Barman Primary Care Physician: Hortencia Pilar Other Clinician: Referring Physician: Hortencia Pilar Treating Physician/Extender: Frann Rider in Treatment: 25 Verbal / Phone Orders: Yes Clinician: Cornell Barman Read Back and Verified: Yes Diagnosis Coding Wound Cleansing Wound #3 Right,Dorsal Foot o Cleanse wound with mild soap and water o May Shower, gently pat wound dry prior to applying new dressing. o May shower with protection. Wound #4 Left,Medial Malleolus o Cleanse wound with mild soap and water o May Shower, gently pat wound dry prior to applying new dressing. o May shower with protection. Wound #5 Right,Lateral Forearm o Cleanse wound with mild soap and water o May Shower, gently pat wound dry prior to applying new dressing. o May shower with protection. Anesthetic Wound #3 Right,Dorsal Foot o Topical Lidocaine 4% cream applied to wound bed prior to debridement Wound #4 Left,Medial Malleolus o Topical Lidocaine 4% cream applied to wound bed prior to debridement Wound #5 Right,Lateral Forearm o  Topical Lidocaine 4% cream applied to wound bed prior to debridement Skin Barriers/Peri-Wound Care Wound #3 Right,Dorsal Foot o Skin Prep Wound #4 Left,Medial Malleolus o Skin Prep Wound #5 Right,Lateral Forearm o Skin Prep Primary Wound Dressing OLAND, ARQUETTE ([id number]) Wound #3 Right,Dorsal Foot o Prisma Ag Wound #4 Left,Medial Malleolus o Aquacel Ag Wound #5 Right,Lateral Forearm o Other: - mepitel Secondary Dressing Wound #3 Right,Dorsal Foot o Boardered Foam Dressing Wound #4 Left,Medial Malleolus o Boardered Foam Dressing Wound #5 Right,Lateral Forearm o Boardered Foam Dressing Dressing Change Frequency Wound #3 Right,Dorsal Foot o Change dressing every other day. Wound #4 Left,Medial Malleolus o Change dressing every other day. Wound #5 Right,Lateral Forearm o Change dressing every week Follow-up Appointments Wound #3 Right,Dorsal Foot o Return Appointment in 1 week. Wound #4 Left,Medial Malleolus o Return Appointment in 1 week. Wound #5 Right,Lateral Forearm o Return Appointment in 1 week. Home Health Wound #3 [place] Visits - [place] Nurse may visit PRN to address patientos wound care needs. o FACE TO FACE ENCOUNTER: MEDICARE and MEDICAID PATIENTS: I certify that this patient is under my care and that I had a face-to-face encounter that meets the physician face-to-face encounter requirements with this patient on this date. The encounter with the patient was in whole or in part for the following MEDICAL CONDITION: (primary reason for [place]) MEDICAL NECESSITY: I certify, that based on my findings, NURSING services are a medically LYTLE, MALBURG ([id number]) necessary home health service. HOME BOUND STATUS: I certify that my clinical findings support that this patient is homebound (i.e., Due to illness or injury, pt requires aid of supportive devices  such as crutches, cane, wheelchairs, walkers, the use of special transportation or the assistance of another person to leave their place of residence. There is a normal inability to leave the home and doing so requires considerable and taxing effort. Other absences are for medical reasons / religious services and are infrequent or of short duration when for other reasons). o If current dressing causes regression in wound condition, may D/C ordered dressing product/s and apply Normal Saline Moist Dressing daily until next [place] / Other MD appointment. [place] of regression in wound condition at [phone]. o Please direct any NON-WOUND related issues/requests for orders to patient's Primary Care Physician Wound #4 [place] Visits - [place] Nurse may visit PRN to  address patientos wound care needs. o FACE TO FACE ENCOUNTER: MEDICARE and MEDICAID PATIENTS: I certify that this patient is under my care and that I had a face-to-face encounter that meets the physician face-to-face encounter requirements with this patient on this date. The encounter with the patient was in whole or in part for the following MEDICAL CONDITION: (primary reason for [place]) MEDICAL NECESSITY: I certify, that based on my findings, NURSING services are a medically necessary home health service. HOME BOUND STATUS: I certify that my clinical findings support that this patient is homebound (i.e., Due to illness or injury, pt requires aid of supportive devices such as crutches, cane, wheelchairs, walkers, the use of special transportation or the assistance of another person to leave their place of residence. There is a normal inability to leave the home and doing so requires considerable and taxing effort. Other absences are for medical reasons / religious services and are infrequent or of short duration when  for other reasons). o If current dressing causes regression in wound condition, may D/C ordered dressing product/s and apply Normal Saline Moist Dressing daily until next [place] / Other MD appointment. [place] of regression in wound condition at [phone]. o Please direct any NON-WOUND related issues/requests for orders to patient's Primary Care Physician Wound #5 Right,Lateral Forearm o [place] Visits - [place] Nurse may visit PRN to address patientos wound care needs. o FACE TO FACE ENCOUNTER: MEDICARE and MEDICAID PATIENTS: I certify that this patient is under my care and that I had a face-to-face encounter that meets the physician face-to-face encounter requirements with this patient on this date. The encounter with the patient was in whole or in part for the following MEDICAL CONDITION: (primary reason for [place]) MEDICAL NECESSITY: I certify, that based on my findings, NURSING services are a medically necessary home health service. HOME BOUND STATUS: I certify that my clinical findings support that this patient is homebound (i.e., Due to illness or injury, pt requires aid of supportive devices such as crutches, cane, wheelchairs, walkers, the use of special transportation or the assistance of another person to leave their place of residence. There is a normal inability to leave the home and doing so requires considerable and taxing effort. Other absences are for medical reasons / religious services and are infrequent or of short duration when for other reasons). JACQUEL, MCCAMISH ([id number]) o If current dressing causes regression in wound condition, may D/C ordered dressing product/s and apply Normal Saline Moist Dressing daily until next [place] / Other MD appointment. [place] of regression in wound condition at [phone]. o Please direct any  NON-WOUND related issues/requests for orders to patient's Primary Care Physician Electronic Signature(s) Signed: 10/28/2014 12:34:21 PM By: Christin Fudge MD, FACS Signed: 10/28/2014 5:40:40 PM By: Gretta Cool RN, BSN, Kim RN, BSN Entered By: Gretta Cool, RN, BSN, Kim on 10/28/2014 10:09:26 Philip [name] ([id number]) -------------------------------------------------------------------------------- Problem List Details Patient Name: EDRICK, WHITEHORN. Date of Service: 10/28/2014 9:30 AM Medical Record Number: [id number] Patient Account Number: [id number] Date of Birth/Sex: 1919-02-05 (81 y.o. Male) Treating RN: Primary Care Physician: Hortencia Pilar Other Clinician: Referring Physician: Hortencia Pilar Treating Physician/Extender: Frann Rider in Treatment: 25 Active Problems ICD-10 Encounter Code Description Active Date Diagnosis I70.232 Atherosclerosis of native arteries of right leg with 06/21/2014 Yes ulceration of calf I82.401 Acute embolism and thrombosis of unspecified deep veins 06/21/2014 Yes of right lower extremity S91.301A  Unspecified open wound, right foot, initial encounter 07/19/2014 Yes Z92.21 Personal history of antineoplastic chemotherapy 08/23/2014 Yes L97.522 Non-pressure chronic ulcer of other part of left foot with fat 09/20/2014 Yes layer exposed S41.111A Laceration without foreign body of right upper arm, initial 10/04/2014 Yes encounter Inactive Problems Resolved Problems ICD-10 Code Description Active Date Resolved Date L97.212 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 Date of Service: 10/28/2014 9:30 AM Medical Record Number: [id number] Patient Account Number: [id number] Date of Birth/Sex: 05-Jun-1918 (81 y.o. Male) Treating RN: Primary Care Physician: Hortencia Pilar Other Clinician: Referring Physician: Hortencia Pilar Treating Physician/Extender: Frann Rider in Treatment: 25 Subjective Chief Complaint Information obtained from Patient R foot ulcer. L forearm ulcer. 07/19/2014 -- about 2 weeks ago he had a surgical procedure done by dermatologist in [place] and has an open surgical wound on the dorsum of the right foot. History of Present Illness (HPI) The following HPI elements were documented for the patient's wound: Location: right leg Duration: Dec 2015 Modifying Factors: history of an injury to the right leg with resulting hematoma and thrombophlebitis and later an ulcer of posterior leg Associated Signs and Symptoms: marked lymphedema of the right leg. He is already on Eloquis. 06/14/14 -- He returns for followup today. He denies any fevers. no fresh issues and his daughter says he's been doing fine. 06/21/14 -- after he sustained a fall this week earlier he applied a bandage over this himself and did not seek any medical attention. he did however manage to control the bleeding and had a dressing in place the next morning when his son to the visit. In this dressing was removed there was further damaged skin. His right leg has been doing fine otherwise. 07/12/14 --Very pleasant 81 year old with past medical history significant for congestive heart failure (EF 15%), peripheral vascular disease, and chronic kidney disease. He was hospitalized at [facility] in December 2015 for congestive heart failure. He says that he fell during his hospital course and developed a hematoma over his right calf. He was also  diagnosed with a right lower extremity DVT for which he takes Eliquis. The hematoma subsequently turned into an ulceration around Christmas, which has healed. He subsequently developed an ulcer on his right dorsal foot and a traumatic left forearm ulcer. Per his report, he underwent biopsy of the right dorsal foot ulceration which demonstrated a skin cancer. His PCP and dermatologist office are both closed today. I reviewed his records in [place] but find no report of biopsy or pathology. He s without complaints today. No significant pain. No fever or chills. Minimal drainage. 07/19/2014 - the patient and his son tell me that about 2 weeks ago the dermatologist did a skin biopsy and this was a large area on the dorsum of his right foot which was left open and no dressing instructions were recommended. Since then he has been called and told that it is a cancer and the need to do a further procedure but that will not happen until about 2 weeks from now. In the meanwhile the patient has not been taking care of his right foot. The left forearm where he had an abrasion and laceration is doing very well. Mccartt,  AIZEN DUVAL ([id number]) 07/26/2014 -- Reports from 07/08/2014 from the dermatology group reviewed. A excision was done of a lesion located on the dorsum of the right foot and this was 1.7 cm in diameter which was a shave biopsy performed. The wound was left open after appropriate cauterization and the patient was given this dressing instructions. 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 08/02/2014 -- all the original problems he came with have completely resolved. He now has a surgical wound on his right foot dorsum where a skin cancer was excised. He goes to see his dermatologist this coming Tuesday and will have definite news next Friday. 08/09/2014 he had gone to his dermatologist on Tuesday and she has  injected the base of his ulcer with some chemotherapeutic agent. He was supposed to bring some papers with him but forgot to get them and will bring them in next week. Other than that the dermatologist had suggested using Mehdi honey on the wound. 08/16/2014 -- the patient has brought in his notes from the dermatologist and on 08/06/2014 he received a injection of 5 FU, 500 mg grams into the lesion. the pathology report was also sent and it was a squamous cell carcinoma well-differentiated and edges were involved. They wanted him to use many honey for the wound dressing changes to be done 3 times a week. 08/30/2014 -- he has finished his second injection of 5-FU and has the next one in 2 weeks' time. He is doing fine otherwise. 09/13/2014 - No new complaints. No significant pain. No fever or chills. Minimal drainage. Still receiving 5-FU injections. 09/20/2014 -- He was seen by the dermatologist on 09/10/2014 and Dr. Phillip Heal injected his foot both the right on the dorsum and left near the medial malleolus with 5-FU. The next dose of 5-FU is to be given after 3 months. The patient says he now has a spot on the left medial malleolus where he was injected with 5-FU. 09/27/2014 -- the area on the left ankle where he was injected with 5-FU is now a full-blown ulcer. He also has mild pain in both ankle areas. 10/04/2014 - large had a bit of a fall and injured his right arm last evening and has had a laceration with no evidence of any foreign body in the right forearm. Objective Constitutional Pulse regular. Respirations normal and unlabored. Afebrile. Vitals Time Taken: 9:37 AM, Height: 69 in, Weight: 179 lbs, BMI: 26.4, Temperature: 97.8 F, Pulse: 64 bpm, Respiratory Rate: 16 breaths/min, Blood Pressure: 108/38 mmHg. General Notes: BP 108/38 MD notified. Patient is asymptomatic. MAOR, MECKEL ([id number]) Eyes Nonicteric. Reactive to light. Ears, Nose, Mouth, and Throat Lips, teeth, and  gums WNL.Marland Kitchen Moist mucosa without lesions . Neck supple and nontender. No palpable supraclavicular or cervical adenopathy. Normal sized without goiter. Respiratory WNL. No retractions.. Cardiovascular Pedal Pulses WNL. No clubbing, cyanosis or edema. Gastrointestinal (GI) Abdomen without masses or tenderness.. No liver or spleen enlargement or tenderness.. Genitourinary (GU) No hydrocele, spermatocele, tenderness of the cord, or testicular mass.Marland Kitchen Penis without lesions.Lowella Fairy without lesions. No cystocele, or rectocele. Pelvic support intact, no discharge. Marland Kitchen Urethra without masses, tenderness or scarring.Marland Kitchen Lymphatic No adneopathy. No adenopathy. No adenopathy. Musculoskeletal Adexa without tenderness or enlargement.. Digits and nails w/o clubbing, cyanosis, infection, petechiae, ischemia, or inflammatory conditions.Marland Kitchen Psychiatric Judgement and insight Intact.. No evidence of depression, anxiety, or agitation.. General Notes: The right forearm wound is completely healed. the right  dorsum of the foot has a clean base and good epithelialization and we will continue with Prisma. The left ankle needs debridement and has some slough at the base. Integumentary (Hair, Skin) No suspicious lesions. No crepitus or fluctuance. No peri-wound warmth or erythema. No masses.. Wound #3 status is Open. Original cause of wound was Surgical Injury. The wound is located on the Right,Dorsal Foot. The wound measures 1cm length x 0.8cm width x 0.3cm depth; 0.628cm^2 area and 0.188cm^3 volume. The wound is limited to skin breakdown. There is a medium amount of serosanguineous drainage noted. The wound margin is distinct with the outline attached to the wound base. There is small (1-33%) pink granulation within the wound bed. There is a medium (34-66%) amount of necrotic tissue within the wound bed including Adherent Slough. The periwound skin appearance exhibited: Localized Edema, Moist. The periwound skin  appearance did not exhibit: Callus, Crepitus, Excoriation, Fluctuance, Friable, Induration, Rash, Scarring, Dry/Scaly, Maceration, Atrophie Blanche, Cyanosis, Ecchymosis, Hemosiderin Staining, Mottled, Pallor, Rubor, Erythema. Periwound temperature was noted as DAMANTE, SPRAGG. ([id number]) No Abnormality. Wound #4 status is Open. Original cause of wound was Other Lesion. The wound is located on the Left,Medial Malleolus. The wound measures 1.2cm length x 1.5cm width x 0.3cm depth; 1.414cm^2 area and 0.424cm^3 volume. The wound is limited to skin breakdown. There is a small amount of serosanguineous drainage noted. The wound margin is distinct with the outline attached to the wound base. There is small (1-33%) pink granulation within the wound bed. There is a medium (34-66%) amount of necrotic tissue within the wound bed including Adherent Slough. The periwound skin appearance exhibited: Localized Edema, Moist. The periwound skin appearance did not exhibit: Callus, Crepitus, Excoriation, Fluctuance, Friable, Induration, Rash, Scarring, Dry/Scaly, Maceration, Atrophie Blanche, Cyanosis, Ecchymosis, Hemosiderin Staining, Mottled, Pallor, Rubor, Erythema. Periwound temperature was noted as No Abnormality. The periwound has tenderness on palpation. Wound #5 status is Open. Original cause of wound was Shear/Friction. The wound is located on the Right,Lateral Forearm. The wound measures 3cm length x 3.2cm width x 0.1cm depth; 7.54cm^2 area and 0.754cm^3 volume. The wound is limited to skin breakdown. There is a small amount of serous drainage noted. The wound margin is distinct with the outline attached to the wound base. There is large (67-100%) red granulation within the wound bed. There is no necrotic tissue within the wound bed. The periwound skin appearance exhibited: Dry/Scaly. The periwound skin appearance did not exhibit: Callus, Crepitus, Excoriation, Fluctuance, Friable, Induration, Localized  Edema, Rash, Scarring, Maceration, Moist, Atrophie Blanche, Cyanosis, Ecchymosis, Hemosiderin Staining, Mottled, Pallor, Rubor, Erythema. Periwound temperature was noted as No Abnormality. 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 His right foot will need Prisma AG and the left ankle will need silver alginate. He will come and see me next week. DAISHAWN, LAUF ([id number]) Procedures Wound #3 Wound #3 is a Malignant Wound located on the Right,Dorsal Foot . There was a Non-Viable Tissue Open Wound/Selective [phone]) debridement with total area of 0.8  sq cm performed by Pat Patrick., MD. with the following instrument(s): Forceps after achieving pain control using Lidocaine 5% topical ointment. A time out was conducted prior to the start of the procedure. There was no bleeding. The procedure was tolerated well with a pain level of 0 throughout and a pain level of 0 following the procedure. Post Debridement Measurements: 1cm length x 0.8cm width x 0.3cm depth; 0.188cm^3 volume. Wound #4 Wound #4 is a Malignant Wound located on the Left,Medial Malleolus . There was a Skin/Subcutaneous Tissue Debridement ([id number]) debridement with total area of 1.8 sq cm performed by Akiva Josey, Jackson Latino., MD. with the following instrument(s): Forceps to remove Viable and Non-Viable tissue/material including Fibrin/Slough, Eschar, and  Subcutaneous after achieving pain control using Lidocaine 5% topical ointment. A time out was conducted prior to the start of the procedure. A Minimum amount of bleeding was controlled with Pressure. The procedure was tolerated well with a pain level of 3 throughout and a pain level of 0 following the procedure. Post Debridement Measurements: 1.2cm length x 1.5cm width x 0.3cm depth; 0.424cm^3 volume. Wound #5 Wound #5 is a Skin Tear located on the Right,Lateral Forearm . There was a Non-Viable Tissue Open Wound/Selective [phone]) debridement with total area of 9.6 sq cm performed by Logyn Dedominicis, Jackson Latino., MD. with the following instrument(s): Forceps to remove Non-Viable tissue/material including Eschar after achieving pain control using Lidocaine 5% topical ointment. A time out was conducted prior to the start of the procedure. There was no bleeding. The procedure was tolerated well with a pain level of 0 throughout and a pain level of 0 following the procedure. Post Debridement Measurements: 0cm length x 0cm width x 0cm depth; 0cm^3 volume. Plan Wound Cleansing: Wound #3 Right,Dorsal Foot: Cleanse wound with mild soap and water May Shower, gently pat wound dry prior to applying new dressing. May shower with protection. Wound #4 Left,Medial Malleolus: Cleanse wound with mild soap and water May Shower, gently pat wound dry prior to applying new dressing. May shower with protection. Wound #5 Right,Lateral Forearm: Cleanse wound with mild soap and water May Shower, gently pat wound dry prior to applying new dressing. May shower with protection. Anesthetic: TAEVIN, MCFERRAN ([id number]) Wound #3 Right,Dorsal Foot: Topical Lidocaine 4% cream applied to wound bed prior to debridement Wound #4 Left,Medial Malleolus: Topical Lidocaine 4% cream applied to wound bed prior to debridement Wound #5 Right,Lateral Forearm: Topical Lidocaine 4% cream applied to wound bed prior to debridement Skin  Barriers/Peri-Wound Care: Wound #3 Right,Dorsal Foot: Skin Prep Wound #4 Left,Medial Malleolus: Skin Prep Wound #5 Right,Lateral Forearm: Skin Prep Primary Wound Dressing: Wound #3 Right,Dorsal Foot: Prisma Ag Wound #4 Left,Medial Malleolus: Aquacel Ag Wound #5 Right,Lateral Forearm: Other: - mepitel Secondary Dressing: Wound #3 Right,Dorsal Foot: Boardered Foam Dressing Wound #4 Left,Medial Malleolus: Boardered Foam Dressing Wound #5 Right,Lateral Forearm: Boardered Foam Dressing Dressing Change Frequency: Wound #3 Right,Dorsal Foot: Change dressing every other day. Wound #4 Left,Medial Malleolus: Change dressing every other day. Wound #5 Right,Lateral Forearm: Change dressing every week Follow-up Appointments: Wound #3 Right,Dorsal Foot: Return Appointment in 1 week. Wound #4 Left,Medial Malleolus: Return Appointment in 1 week. Wound #5 Right,Lateral Forearm: Return Appointment in 1 week. Home Health: Wound #3 Right,Dorsal Foot: [place] Visits - [place] Nurse may visit PRN to address patient s wound care needs. FACE TO FACE ENCOUNTER: MEDICARE and MEDICAID PATIENTS: I certify that this patient is under my care and that I had a face-to-face  encounter that meets the physician face-to-face encounter requirements with this patient on this date. The encounter with the patient was in whole or in part for the following MEDICAL CONDITION: (primary reason for [place]) MEDICAL NECESSITY: I certify, that based on my findings, NURSING services are a medically necessary home health service. HOME BOUND STATUS: I certify that my clinical findings support that this patient is homebound (i.e., Due to ALANTE, TOLAN. ([id number]) illness or injury, pt requires aid of supportive devices such as crutches, cane, wheelchairs, walkers, the use of special transportation or the assistance of another person to leave their place of residence. There  is a normal inability to leave the home and doing so requires considerable and taxing effort. Other absences are for medical reasons / religious services and are infrequent or of short duration when for other reasons). If current dressing causes regression in wound condition, may D/C ordered dressing product/s and apply Normal Saline Moist Dressing daily until next [place] / Other MD appointment. Liborio Negron Torres of regression in wound condition at [phone]. Please direct any NON-WOUND related issues/requests for orders to patient's Primary Care Physician Wound #4 Left,Medial Malleolus: [place] Visits - [place] Nurse may visit PRN to address patient s wound care needs. FACE TO FACE ENCOUNTER: MEDICARE and MEDICAID PATIENTS: I certify that this patient is under my care and that I had a face-to-face encounter that meets the physician face-to-face encounter requirements with this patient on this date. The encounter with the patient was in whole or in part for the following MEDICAL CONDITION: (primary reason for [place]) MEDICAL NECESSITY: I certify, that based on my findings, NURSING services are a medically necessary home health service. HOME BOUND STATUS: I certify that my clinical findings support that this patient is homebound (i.e., Due to illness or injury, pt requires aid of supportive devices such as crutches, cane, wheelchairs, walkers, the use of special transportation or the assistance of another person to leave their place of residence. There is a normal inability to leave the home and doing so requires considerable and taxing effort. Other absences are for medical reasons / religious services and are infrequent or of short duration when for other reasons). If current dressing causes regression in wound condition, may D/C ordered dressing product/s and apply Normal Saline Moist Dressing daily until next [place] / Other MD appointment. [place] of regression in wound condition at [phone]. Please direct any NON-WOUND related issues/requests for orders to patient's Primary Care Physician Wound #5 Right,Lateral Forearm: [place] Nurse may visit PRN to address patient s wound care needs. FACE TO FACE ENCOUNTER: MEDICARE and MEDICAID PATIENTS: I certify that this patient is under my care and that I had a face-to-face encounter that meets the physician face-to-face encounter requirements with this patient on this date. The encounter with the patient was in whole or in part for the following MEDICAL CONDITION: (primary reason for [place]) MEDICAL NECESSITY: I certify, that based on my findings, NURSING services are a medically necessary home health service. HOME BOUND STATUS: I certify that my clinical findings support that this patient is homebound (i.e., Due to illness or injury, pt requires aid of supportive devices such as crutches, cane, wheelchairs, walkers, the use of special transportation or the assistance of another person to leave their place of residence. There is a normal inability to leave the home  and doing so requires considerable and taxing effort. Other absences are for medical reasons / religious services and are infrequent or of short duration when for other reasons). If current dressing causes regression in wound condition, may D/C ordered dressing product/s and apply Normal Saline Moist Dressing daily until next [place] / Other MD appointment. [place] of regression in wound condition at [phone]. Please direct any NON-WOUND related issues/requests for orders to patient's Primary Care Physician I have recommended a piece of Mepitel and bordered foam on his right forearm. His right foot will need Prisma AG and the left ankle will need silver  alginate. RONAL, [place] ([id number]) He will come and see me next week. Electronic Signature(s) Signed: 10/28/2014 12:47:03 PM By: Christin Fudge MD, FACS Previous Signature: 10/28/2014 10:24:40 AM Version By: Christin Fudge MD, FACS Entered By: Christin Fudge on 10/28/2014 12:47:03 Philip [name] ([id number]) -------------------------------------------------------------------------------- SuperBill Details Patient Name: ELIA, KEENUM. Date of Service: 10/28/2014 Medical Record Number: [id number] Patient Account Number: [id number] Date of Birth/Sex: Dec 12, 1918 (81 y.o. 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 I82.401 Acute embolism and thrombosis of unspecified deep veins of right lower extremity S91.301A Unspecified open wound, right foot, initial encounter Z92.21 Personal history of antineoplastic chemotherapy L97.522 Non-pressure chronic ulcer of other part of left foot with fat layer exposed S41.111A Laceration without foreign body of right upper arm, initial encounter Facility Procedures The patient participates with Medicare or their insurance follows the Medicare Facility Guidelines: CPT4 Code Description Modifier Quantity [id number] 11042 - DEB SUBQ TISSUE 20 SQ CM/< 1 ICD-10 Description Diagnosis L97.522 Non-pressure chronic ulcer of  other part of left foot with fat layer exposed I70.232 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 The patient participates with Medicare or their insurance follows the Medicare Facility Guidelines: [id number] - DEBRIDE WOUND 1ST 20 SQ CM OR < 59 1 ICD-10 Description Diagnosis S41.111A Laceration without foreign body of right upper arm, initial  encounter  S91.301A Unspecified open wound, right foot, initial encounter I70.232 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

## 2014-11-01 ENCOUNTER — Encounter: Payer: Medicare Other | Admitting: Surgery

## 2014-11-01 DIAGNOSIS — L97521 Non-pressure chronic ulcer of other part of left foot limited to breakdown of skin: Secondary | ICD-10-CM | POA: Diagnosis not present

## 2014-11-02 NOTE — Progress Notes (Addendum)
JDEN, WANT ([id number]) Visit Report for 11/01/2014 Arrival Information Details Patient Name: Philip [name], Philip [name]. Date of Service: 11/01/2014 9:30 AM Medical Record Number: [id number] Patient Account Number: [id number] Date of Birth/Sex: 1919-01-21 (81 y.o. Male) Treating RN: Cornell Barman Primary Care Physician: Hortencia Pilar Philip [name] Clinician: Referring Physician: Hortencia Pilar Treating Physician/Extender: Frann Rider in Treatment: 26 Visit Information History Since Last Visit Added or deleted any medications: No Patient Arrived: Ambulatory Any new allergies or adverse reactions: No Arrival Time: 09:25 Had a fall or experienced change in No Accompanied By: son and aide activities of daily living that may affect Transfer Assistance: None risk of falls: Patient Identification Verified: Yes Signs or symptoms of abuse/neglect since last No Secondary Verification Process Yes visito Completed: Hospitalized since last visit: No Patient Requires Transmission- No Has Dressing in Place as Prescribed: Yes Based Precautions: Pain Present Now: No Patient Has Alerts: Yes Patient Alerts: Patient on Blood Thinner No ABIs dt LE blood clots Electronic Signature(s) Signed: 11/21/2014 3:56:35 PM By: Gretta Cool, RN, BSN, Kim RN, BSN Previous Signature: 11/01/2014 5:11:11 PM Version By: Gretta Cool, RN, BSN, Kim RN, BSN Entered By: Gretta Cool, RN, BSN, Kim on 11/08/2014 07:47:47 [name] ([id number]) -------------------------------------------------------------------------------- Clinic Level of Care Assessment Details Patient Name: Philip [name], Philip [name]. Date of Service: 11/01/2014 9:30 AM Medical Record Number: [id number] Patient Account Number: [id number] Date of Birth/Sex: June 13, 1918 (81 y.o. Male) Treating RN: Cornell Barman Primary Care Physician: Hortencia Pilar Philip [name] Clinician: Referring Physician: Hortencia Pilar Treating Physician/Extender: Frann Rider in Treatment: 26 Clinic Level of  Care Assessment Items TOOL 4 Quantity Score []  - Use when only an EandM is performed on FOLLOW-UP visit 0 ASSESSMENTS - Nursing Assessment / Reassessment []  - Reassessment of Co-morbidities (includes updates in patient status) 0 X - Reassessment of Adherence to Treatment Plan 1 5 ASSESSMENTS - Wound and Skin Assessment / Reassessment []  - Simple Wound Assessment / Reassessment - one wound 0 X - Complex Wound Assessment / Reassessment - multiple wounds 3 5 []  - Dermatologic / Skin Assessment (not related to wound area) 0 ASSESSMENTS - Focused Assessment []  - Circumferential Edema Measurements - multi extremities 0 []  - Nutritional Assessment / Counseling / Intervention 0 []  - Lower Extremity Assessment (monofilament, tuning fork, pulses) 0 []  - Peripheral Arterial Disease Assessment (using hand held doppler) 0 ASSESSMENTS - Ostomy and/or Continence Assessment and Care []  - Incontinence Assessment and Management 0 []  - Ostomy Care Assessment and Management (repouching, etc.) 0 PROCESS - Coordination of Care X - Simple Patient / Family Education for ongoing care 1 15 []  - Complex (extensive) Patient / Family Education for ongoing care 0 X - Staff obtains Programmer, systems, Records, Test Results / Process Orders 1 10 []  - Staff telephones HHA, Nursing Homes / Clarify orders / etc 0 []  - Routine Transfer to another Facility (non-emergent condition) 0 Philip [name], Philip [name] ([id number]) []  - Routine Hospital Admission (non-emergent condition) 0 []  - New Admissions / Biomedical engineer / Ordering NPWT, Apligraf, etc. 0 []  - Emergency Hospital Admission (emergent condition) 0 X - Simple Discharge Coordination 1 10 []  - Complex (extensive) Discharge Coordination 0 PROCESS - Special Needs []  - Pediatric / Minor Patient Management 0 []  - Isolation Patient Management 0 []  - Hearing / Language / Visual special needs 0 []  - Assessment of Community assistance (transportation, D/C planning, etc.) 0 []  -  Additional assistance / Altered mentation 0 []  - Support Surface(s) Assessment (bed, cushion, seat, etc.) 0 INTERVENTIONS - Wound Cleansing / Measurement []  -  Simple Wound Cleansing - one wound 0 X - Complex Wound Cleansing - multiple wounds 3 5 X - Wound Imaging (photographs - any number of wounds) 1 5 []  - Wound Tracing (instead of photographs) 0 X - Simple Wound Measurement - one wound 1 5 []  - Complex Wound Measurement - multiple wounds 0 INTERVENTIONS - Wound Dressings []  - Small Wound Dressing one or multiple wounds 0 X - Medium Wound Dressing one or multiple wounds 3 15 []  - Large Wound Dressing one or multiple wounds 0 []  - Application of Medications - topical 0 []  - Application of Medications - injection 0 INTERVENTIONS - Miscellaneous []  - External ear exam 0 Philip [name], Philip [name] ([id number]) []  - Specimen Collection (cultures, biopsies, blood, body fluids, etc.) 0 []  - Specimen(s) / Culture(s) sent or taken to Lab for analysis 0 []  - Patient Transfer (multiple staff / Harrel Lemon Lift / Similar devices) 0 []  - Simple Staple / Suture removal (25 or less) 0 []  - Complex Staple / Suture removal (26 or more) 0 []  - Hypo / Hyperglycemic Management (close monitor of Blood Glucose) 0 []  - Ankle / Brachial Index (ABI) - do not check if billed separately 0 X - Vital Signs 1 5 Has the patient been seen at the hospital within the last three years: Yes Total Score: 130 Level Of Care: New/Established - Level 4 Electronic Signature(s) Signed: 11/01/2014 5:11:11 PM By: Gretta Cool, RN, BSN, Kim RN, BSN Entered By: Gretta Cool, RN, BSN, Kim on 11/01/2014 10:02:48 [name] ([id number]) -------------------------------------------------------------------------------- Encounter Discharge Information Details Patient Name: Philip [name], Philip [name]. Date of Service: 11/01/2014 9:30 AM Medical Record Number: [id number] Patient Account Number: [id number] Date of Birth/Sex: Jun 09, 1918 (81 y.o. Male) Treating RN: Cornell Barman Primary Care Physician: Hortencia Pilar Philip [name] Clinician: Referring Physician: Hortencia Pilar Treating Physician/Extender: Frann Rider in Treatment: 45 Encounter Discharge Information Items Discharge Pain Level: 0 Discharge Condition: Stable Ambulatory Status: Cane Discharge Destination: Home Transportation: Private Auto son and Accompanied By: caregiver Schedule Follow-up Appointment: Yes Medication Reconciliation completed Yes and provided to Patient/Care Onetha Gaffey: Clinical Summary of Care: Electronic Signature(s) Signed: 11/01/2014 5:11:11 PM By: Gretta Cool, RN, BSN, Kim RN, BSN Entered By: Gretta Cool, RN, BSN, Kim on 11/01/2014 10:04:34 [name] ([id number]) -------------------------------------------------------------------------------- Lower Extremity Assessment Details Patient Name: Philip [name], Philip [name]. Date of Service: 11/01/2014 9:30 AM Medical Record Number: [id number] Patient Account Number: [id number] Date of Birth/Sex: September 30, 1918 (81 y.o. Male) Treating RN: Cornell Barman Primary Care Physician: Hortencia Pilar Philip [name] Clinician: Referring Physician: Hortencia Pilar Treating Physician/Extender: Frann Rider in Treatment: 26 Vascular Assessment Pulses: Posterior Tibial Dorsalis Pedis Palpable: [Left:No] [Right:No] Doppler: [Left:Monophasic] [Right:Monophasic] Extremity colors, hair growth, and conditions: Extremity Color: [Left:Mottled] [Right:N/A] Hair Growth on Extremity: [Left:No] [Right:No] Temperature of Extremity: [Left:Warm] [Right:Warm] Capillary Refill: [Left:< 3 seconds] [Right:< 3 seconds] Toe Nail Assessment Left: Right: Thick: Yes Yes Discolored: Yes Yes Deformed: No No Improper Length and Hygiene: No No Electronic Signature(s) Signed: 11/01/2014 5:11:11 PM By: Gretta Cool, RN, BSN, Kim RN, BSN Entered By: Gretta Cool, RN, BSN, Kim on 11/01/2014 09:36:48 [name]  ([id number]) -------------------------------------------------------------------------------- Multi Wound Chart Details Patient Name: Philip [name], Philip [name]. Date of Service: 11/01/2014 9:30 AM Medical Record Number: [id number] Patient Account Number: [id number] Date of Birth/Sex: 1919-03-02 (81 y.o. 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] [4:Malignant Wound] [5:Skin Tear] Comorbid History: [3:Cataracts, Arrhythmia, Congestive Heart Failure] [4:Cataracts, Arrhythmia, Congestive Heart Failure] [5:Cataracts, Arrhythmia, Congestive Heart Failure] Date Acquired: [3:07/01/2014] [4:09/20/2014] [5:10/01/2014] Weeks of Treatment: [3:16] [4:6] [5:4] Wound Status: [3:Open] [4:Open] [5:Open] Measurements L x W x D 1x1x0.2 [4:1.5x2.2x0.3] [5:1x0.5x0.1] (cm) Area (cm) : [3:0.785] [4:2.592] [5:0.393] Volume (cm) : [3:0.157] [4:0.778] [5:0.039] % Reduction in Area: [3:58.40%] [4:-1551.00%] [5:93.70%] % Reduction in Volume: 79.20% [4:-4762.50%] [5:93.80%] Classification: [3:Full Thickness Without Exposed Support Structures] [4:Partial Thickness] [5:Partial Thickness] Exudate Amount: [3:Medium] [4:Small] [5:Small] Exudate Type: [3:Serosanguineous] [4:Serosanguineous] [5:Serous] Exudate Color: [3:red, brown] [4:red, brown] [5:amber] Wound Margin: [3:Distinct, outline attached] [4:Distinct, outline attached] [5:Distinct, outline  attached] Granulation Amount: [3:Small (1-33%)] [4:Small (1-33%)] [5:Large (67-100%)] Granulation Quality: [3:Pink] [4:Pink] [5:Red] Necrotic Amount: [3:Medium (34-66%)] [4:Medium (34-66%)] [5:None Present (0%)] Exposed Structures: [3:Fascia: No Fat: No Tendon: No Muscle: No Joint: No] [4:Fascia: No Fat: No Tendon: No Muscle: No Joint: No] [5:Fascia: No Fat: No Tendon: No Muscle: No Joint: No] Bone: No Bone: No Bone: No Limited to Skin Limited to Skin Limited to Skin Breakdown Breakdown Breakdown Epithelialization: None None Large (67-100%) Periwound Skin Texture: Edema: Yes Edema: Yes Edema: No Excoriation: No Excoriation: No Excoriation: No Induration: No Induration: No Induration: No Callus: No Callus: No Callus: No Crepitus: No Crepitus: No Crepitus: No Fluctuance: No Fluctuance: No Fluctuance: No Friable: No Friable: No Friable: No Rash: No Rash: No Rash: No Scarring: No Scarring: No Scarring: No Periwound Skin Moist: Yes Moist: Yes Dry/Scaly: Yes Moisture: Maceration: No Maceration: No Maceration: No Dry/Scaly: No Dry/Scaly: No Moist: No Periwound Skin Color: Atrophie Blanche: No Atrophie Blanche: No Atrophie Blanche: No Cyanosis: No Cyanosis: No Cyanosis: No Ecchymosis: No Ecchymosis: No Ecchymosis: No Erythema: No Erythema: No Erythema: No Hemosiderin Staining: No Hemosiderin Staining: No Hemosiderin Staining: No Mottled: No Mottled: No Mottled: No Pallor: No Pallor: No Pallor: No Rubor: No Rubor: No Rubor: No Temperature: No Abnormality No Abnormality No Abnormality Tenderness on No Yes No Palpation: Wound Preparation: Ulcer Cleansing: Ulcer Cleansing: Ulcer Cleansing: Rinsed/Irrigated with Rinsed/Irrigated with Rinsed/Irrigated with Saline Saline Saline Topical Anesthetic Topical Anesthetic Topical Anesthetic Applied: Philip [name]: lidocaine Applied: Philip [name]: lidocaine Applied: Xylocaine 4% 4% 4% Topical Solution Treatment  Notes Electronic Signature(s) Signed: 11/01/2014 5:11:11 PM By: Gretta Cool, RN, BSN, Kim RN, BSN Entered By: Gretta Cool, RN, BSN, Kim on 11/01/2014 09:41:30 [name] ([id number]) -------------------------------------------------------------------------------- [place] [facility] Details Patient Name: Philip [name], Philip [name]. Date of Service: 11/01/2014 9:30 AM Medical Record Number: [id number] Patient Account Number: [id number] Date of Birth/Sex: [birth year] (81 y.o. Male) Treating RN: Cornell Barman Primary Care Physician: Hortencia Pilar Philip [name] Clinician: Referring Physician: Hortencia Pilar Treating Physician/Extender: Frann Rider in Treatment: 49 Active Inactive Abuse / Safety / Falls / Self Care Management Nursing Diagnoses: Abuse or neglect; actual or potential Potential for falls Goals: Patient will remain injury free Date Initiated: 05/02/2014 Goal Status: Active Interventions: Assess fall risk on admission and as needed Notes: Necrotic Tissue Nursing Diagnoses: Impaired tissue integrity related to necrotic/devitalized tissue Goals: Necrotic/devitalized tissue will be minimized in the wound bed Date Initiated: 05/02/2014 Goal Status: Active Interventions: Assess patient pain level pre-, during and post procedure and prior to discharge Treatment Activities: Apply topical anesthetic as ordered : 11/01/2014 Notes: Orientation to the Wound Care Program Nursing Diagnoses: Knowledge deficit related to  the wound healing center program JOREL, GRAVLIN ([id number]) Goals: Patient/caregiver will verbalize understanding of the [facility] Program Date Initiated: 05/02/2014 Goal Status: Active Interventions: Provide education on orientation to the wound center Notes: Electronic Signature(s) Signed: 11/01/2014 5:11:11 PM By: Gretta Cool, RN, BSN, Kim RN, BSN Entered By: Gretta Cool, RN, BSN, Kim on 11/01/2014 09:41:22 [name]  ([id number]) -------------------------------------------------------------------------------- Pain Assessment Details Patient Name: Philip [name], Philip [name]. Date of Service: 11/01/2014 9:30 AM Medical Record Number: [id number] Patient Account Number: [id number] Date of Birth/Sex: 04-14-1919 (81 y.o. Male) Treating RN: Cornell Barman Primary Care Physician: Hortencia Pilar Philip [name] Clinician: Referring Physician: Hortencia Pilar Treating Physician/Extender: Frann Rider in Treatment: 26 Active Problems Location of Pain Severity and Description of Pain Patient Has Paino No Site Locations Pain Management and Medication Current Pain Management: Electronic Signature(s) Signed: 11/01/2014 5:11:11 PM By: Gretta Cool, RN, BSN, Kim RN, BSN Entered By: Gretta Cool, RN, BSN, Kim on 11/01/2014 84:69:62 [name] ([id number]) -------------------------------------------------------------------------------- Patient/Caregiver Education Details Patient Name: Philip [name], Philip [name]. Date of Service: 11/01/2014 9:30 AM Medical Record Number: [id number] Patient Account Number: [id number] Date of Birth/Gender: 11-23-1918 (81 y.o. Male) Treating RN: Cornell Barman Primary Care Physician: Hortencia Pilar Philip [name] Clinician: Referring Physician: Hortencia Pilar Treating Physician/Extender: Frann Rider in Treatment: 69 Education Assessment Education Provided To: Patient Education Topics Provided Wound/Skin Impairment: Handouts: Caring for Your Ulcer, Philip [name]: HHRn to continue wound care as ordered Electronic Signature(s) Signed: 11/01/2014 5:11:11 PM By: Gretta Cool, RN, BSN, Kim RN, BSN Entered By: Gretta Cool, RN, BSN, Kim on 11/01/2014 10:04:52 [name] ([id number]) -------------------------------------------------------------------------------- Wound Assessment Details Patient Name: Philip [name], Philip [name]. Date of Service: 11/01/2014 9:30 AM Medical Record Number: [id number] Patient Account Number: [id number] Date of Birth/Sex:  1919-01-22 (81 y.o. Male) Treating RN: Cornell Barman Primary Care Physician: Hortencia Pilar Philip [name] Clinician: Referring Physician: Hortencia Pilar Treating Physician/Extender: Frann Rider in Treatment: 26 Wound Status Wound Number: 3 Primary Malignant Wound Etiology: Wound Location: Right Foot - Dorsal Wound Status: Open Wounding Event: Surgical Injury Comorbid Cataracts, Arrhythmia, Congestive Date Acquired: 07/01/2014 History: Heart Failure Weeks Of Treatment: 16 Clustered Wound: No Photos Photo Uploaded By: Gretta Cool, RN, BSN, Kim on 11/01/2014 17:49:35 Wound Measurements Length: (cm) 1 Width: (cm) 1 Depth: (cm) 0.2 Area: (cm) 0.785 Volume: (cm) 0.157 % Reduction in Area: 58.4% % Reduction in Volume: 79.2% Epithelialization: None Wound Description Full Thickness Without Exposed Classification: Support Structures Wound Margin: Distinct, outline attached Exudate Medium Amount: Exudate Type: Serosanguineous Exudate Color: red, brown Foul Odor After Cleansing: No Wound Bed Granulation Amount: Small (1-33%) Exposed Structure Granulation Quality: Pink Fascia Exposed: No Necrotic Amount: Medium (34-66%) Fat Layer Exposed: No DENZAL, MEIR ([id number]) Necrotic Quality: Adherent Slough Tendon Exposed: No Muscle Exposed: No Joint Exposed: No Bone Exposed: No Limited to Skin Breakdown Periwound Skin Texture Texture Color No Abnormalities Noted: No No Abnormalities Noted: No Callus: No Atrophie Blanche: No Crepitus: No Cyanosis: No Excoriation: No Ecchymosis: No Fluctuance: No Erythema: No Friable: No Hemosiderin Staining: No Induration: No Mottled: No Localized Edema: Yes Pallor: No Rash: No Rubor: No Scarring: No Temperature / Pain Moisture Temperature: No Abnormality No Abnormalities Noted: No Dry / Scaly: No Maceration: No Moist: Yes Wound Preparation Ulcer Cleansing: Rinsed/Irrigated with Saline Topical Anesthetic Applied: Philip [name]:  lidocaine 4%, Electronic Signature(s) Signed: 11/01/2014 5:11:11 PM By: Gretta Cool, RN, BSN, Kim RN, BSN Entered By: Gretta Cool, RN, BSN, Kim on 11/01/2014 09:39:35 [name] ([id number]) -------------------------------------------------------------------------------- Wound Assessment Details Patient Name: Philip [name], Philip [name]. Date of Service: 11/01/2014 9:30 AM  Medical Record Number: [id number] Patient Account Number: [id number] Date of Birth/Sex: 1919/01/16 (81 y.o. Male) Treating RN: Cornell Barman Primary Care Physician: Hortencia Pilar Philip [name] Clinician: Referring Physician: Hortencia Pilar Treating Physician/Extender: Frann Rider in Treatment: 26 Wound Status Wound Number: 4 Primary Malignant Wound Etiology: Wound Location: Left Malleolus - Medial Wound Status: Open Wounding Event: Philip [name] Lesion Comorbid Cataracts, Arrhythmia, Congestive Date Acquired: 09/20/2014 History: Heart Failure Weeks Of Treatment: 6 Clustered Wound: No Photos Photo Uploaded By: Gretta Cool, RN, BSN, Kim on 11/01/2014 17:49:36 Wound Measurements Length: (cm) 1.5 Width: (cm) 2.2 Depth: (cm) 0.3 Area: (cm) 2.592 Volume: (cm) 0.778 % Reduction in Area: -1551% % Reduction in Volume: -4762.5% Epithelialization: None Wound Description Classification: Partial Thickness Wound Margin: Distinct, outline attached Exudate Amount: Small Exudate Type: Serosanguineous Exudate Color: red, brown Foul Odor After Cleansing: No Wound Bed Granulation Amount: Small (1-33%) Exposed Structure Granulation Quality: Pink Fascia Exposed: No Necrotic Amount: Medium (34-66%) Fat Layer Exposed: No Necrotic Quality: Adherent Slough Tendon Exposed: No ALFORD, GAMERO ([id number]) Muscle Exposed: No Joint Exposed: No Bone Exposed: No Limited to Skin Breakdown Periwound Skin Texture Texture Color No Abnormalities Noted: No No Abnormalities Noted: No Callus: No Atrophie Blanche: No Crepitus: No Cyanosis: No Excoriation:  No Ecchymosis: No Fluctuance: No Erythema: No Friable: No Hemosiderin Staining: No Induration: No Mottled: No Localized Edema: Yes Pallor: No Rash: No Rubor: No Scarring: No Temperature / Pain Moisture Temperature: No Abnormality No Abnormalities Noted: No Tenderness on Palpation: Yes Dry / Scaly: No Maceration: No Moist: Yes Wound Preparation Ulcer Cleansing: Rinsed/Irrigated with Saline Topical Anesthetic Applied: Philip [name]: lidocaine 4%, Electronic Signature(s) Signed: 11/01/2014 5:11:11 PM By: Gretta Cool, RN, BSN, Kim RN, BSN Entered By: Gretta Cool, RN, BSN, Kim on 11/01/2014 09:39:53 [name] ([id number]) -------------------------------------------------------------------------------- Wound Assessment Details Patient Name: Philip [name], LATOUCHE. Date of Service: 11/01/2014 9:30 AM Medical Record Number: [id number] Patient Account Number: [id number] Date of Birth/Sex: 1919-02-10 (81 y.o. Male) Treating RN: Cornell Barman Primary Care Physician: Hortencia Pilar Philip [name] Clinician: Referring Physician: Hortencia Pilar Treating Physician/Extender: Frann Rider in Treatment: 26 Wound Status Wound Number: 5 Primary Skin Tear Etiology: Wound Location: Right Forearm - Lateral Wound Status: Open Wounding Event: Shear/Friction Comorbid Cataracts, Arrhythmia, Congestive Date Acquired: 10/01/2014 History: Heart Failure Weeks Of Treatment: 4 Clustered Wound: No Photos Photo Uploaded By: Gretta Cool, RN, BSN, Kim on 11/01/2014 17:50:29 Wound Measurements Length: (cm) 1 Width: (cm) 0.5 Depth: (cm) 0.1 Area: (cm) 0.393 Volume: (cm) 0.039 % Reduction in Area: 93.7% % Reduction in Volume: 93.8% Epithelialization: Large (67-100%) Wound Description Classification: Partial Thickness Wound Margin: Distinct, outline attached Exudate Amount: Small Exudate Type: Serous Exudate Color: amber Foul Odor After Cleansing: No Wound Bed Granulation Amount: Large (67-100%) Exposed  Structure Granulation Quality: Red Fascia Exposed: No Necrotic Amount: None Present (0%) Fat Layer Exposed: No Tendon Exposed: No CHEROKEE, CLOWERS ([id number]) Muscle Exposed: No Joint Exposed: No Bone Exposed: No Limited to Skin Breakdown Periwound Skin Texture Texture Color No Abnormalities Noted: No No Abnormalities Noted: No Callus: No Atrophie Blanche: No Crepitus: No Cyanosis: No Excoriation: No Ecchymosis: No Fluctuance: No Erythema: No Friable: No Hemosiderin Staining: No Induration: No Mottled: No Localized Edema: No Pallor: No Rash: No Rubor: No Scarring: No Temperature / Pain Moisture Temperature: No Abnormality No Abnormalities Noted: No Dry / Scaly: Yes Maceration: No Moist: No Wound Preparation Ulcer Cleansing: Rinsed/Irrigated with Saline Topical Anesthetic Applied: Xylocaine 4% Topical Solution Electronic Signature(s) Signed: 11/01/2014 5:11:11 PM By: Gretta Cool, RN, BSN, Kim RN, BSN Entered By: Gretta Cool,  RN, BSN, Kim on 11/01/2014 09:40:04 [name] ([id number]) -------------------------------------------------------------------------------- [place] Details Patient Name: AUSTINE, WIEDEMAN. Date of Service: 11/01/2014 9:30 AM Medical Record Number: [id number] Patient Account Number: [id number] Date of Birth/Sex: 24-Jun-1918 (81 y.o. Male) Treating RN: Cornell Barman Primary Care Physician: Hortencia Pilar Philip [name] Clinician: Referring Physician: Hortencia Pilar Treating Physician/Extender: Frann Rider in Treatment: 26 Vital Signs Time Taken: 09:28 Temperature (F): 97.9 Height (in): 69 Pulse (bpm): 63 Weight (lbs): 179 Respiratory Rate (breaths/min): 18 Body Mass Index (BMI): 26.4 Blood Pressure (mmHg): 114/44 Reference Range: 80 - 120 mg / dl Electronic Signature(s) Signed: 11/01/2014 5:11:11 PM By: Gretta Cool, RN, BSN, Kim RN, BSN Entered By: Gretta Cool, RN, BSN, Kim on 11/01/2014 09:31:56

## 2014-11-02 NOTE — Progress Notes (Signed)
MANNIX, KROEKER ([id number]) Visit Report for 11/01/2014 Chief Complaint Document Details Patient Name: Philip [name], Philip [name]. Date of Service: 11/01/2014 9:30 AM Medical Record Number: [id number] Patient Account Number: [id number] Date of Birth/Sex: Sep 15, 1918 (81 y.o. Male) Treating RN: Primary Care Physician: Hortencia Pilar Other Clinician: Referring Physician: Hortencia Pilar Treating Physician/Extender: Frann Rider in Treatment: 26 Information Obtained from: Patient Chief Complaint R foot ulcer. L forearm ulcer. 07/19/2014 -- about 2 weeks ago he had a surgical procedure done by dermatologist in [place] and has an open surgical wound on the dorsum of the right foot. Electronic Signature(s) Signed: 11/01/2014 9:53:05 AM By: Christin Fudge MD, FACS Entered By: Christin Fudge on 11/01/2014 09:53:05 [name] ([id number]) -------------------------------------------------------------------------------- HPI Details Patient Name: Philip [name]. Date of Service: 11/01/2014 9:30 AM Medical Record Number: [id number] Patient Account Number: [id number] Date of Birth/Sex: 05/31/1918 (81 y.o. Male) Treating RN: Primary Care Physician: Hortencia Pilar Other Clinician: Referring Physician: Hortencia Pilar Treating Physician/Extender: Frann Rider in Treatment: 26 History of Present Illness Location: right leg Duration: Dec 2015 Modifying Factors: history of an injury to the right leg with resulting hematoma and thrombophlebitis and later an ulcer of posterior leg Associated Signs and Symptoms: marked lymphedema of the right leg. He is already on Eloquis. HPI Description: 06/14/14 -- He returns for followup today. He denies any fevers. no fresh issues and his daughter says he's been doing fine. 06/21/14 -- after he sustained a fall this week earlier he applied a bandage over this himself and did not seek any medical attention. he did however manage to control the bleeding and had a  dressing in place the next morning when his son to the visit. In this dressing was removed there was further damaged skin. His right leg has been doing fine otherwise. 07/12/14 --Very pleasant 81 year old with past medical history significant for congestive heart failure (EF 15%), peripheral vascular disease, and chronic kidney disease. He was hospitalized at [facility] in December 2015 for congestive heart failure. He says that he fell during his hospital course and developed a hematoma over his right calf. He was also diagnosed with a right lower extremity DVT for which he takes Eliquis. The hematoma subsequently turned into an ulceration around Christmas, which has healed. He subsequently developed an ulcer on his right dorsal foot and a traumatic left forearm ulcer. Per his report, he underwent biopsy of the right dorsal foot ulceration which demonstrated a skin cancer. His PCP and dermatologist office are both closed today. I reviewed his records in [place] but find no report of biopsy or pathology. He s without complaints today. No significant pain. No fever or chills. Minimal drainage. 07/19/2014 - the patient and his son tell me that about 2 weeks ago the dermatologist did a skin biopsy and this was a large area on the dorsum of his right foot which was left open and no dressing instructions were recommended. Since then he has been called and told that it is a cancer and the need to do a further procedure but that will not happen until about 2 weeks from now. In the meanwhile the patient has not been taking care of his right foot. The left forearm where he had an abrasion and laceration is doing very well. 07/26/2014 -- Reports from 07/08/2014 from the dermatology group reviewed. A excision was done of a lesion located on the dorsum of the right foot and this was 1.7 cm in diameter which was a shave biopsy performed. The wound  was left open after appropriate cauterization and the patient was given  this dressing instructions. 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 08/02/2014 -- all the original problems he came with have completely resolved. He now has a surgical wound on his right foot dorsum where a skin cancer was excised. He goes to see his dermatologist this Philip [name] ([id number]) coming Tuesday and will have definite news next Friday. 08/09/2014 he had gone to his dermatologist on Tuesday and she has injected the base of his ulcer with some chemotherapeutic agent. He was supposed to bring some papers with him but forgot to get them and will bring them in next week. Other than that the dermatologist had suggested using Mehdi honey on the wound. 08/16/2014 -- the patient has brought in his notes from the dermatologist and on 08/06/2014 he received a injection of 5 FU, 500 mg grams into the lesion. the pathology report was also sent and it was a squamous cell carcinoma well-differentiated and edges were involved. They wanted him to use many honey for the wound dressing changes to be done 3 times a week. 08/30/2014 -- he has finished his second injection of 5-FU and has the next one in 2 weeks' time. He is doing fine otherwise. 09/13/2014 - No new complaints. No significant pain. No fever or chills. Minimal drainage. Still receiving 5-FU injections. 09/20/2014 -- He was seen by the dermatologist on 09/10/2014 and Dr. Phillip Heal injected his foot both the right on the dorsum and left near the medial malleolus with 5-FU. The next dose of 5-FU is to be given after 3 months. The patient says he now has a spot on the left medial malleolus where he was injected with 5-FU. 09/27/2014 -- the area on the left ankle where he was injected with 5-FU is now a full-blown ulcer. He also has mild pain in both ankle areas. 10/04/2014 - large had a bit of a fall and injured his right arm last evening and  has had a laceration with no evidence of any foreign body in the right forearm. Electronic Signature(s) Signed: 11/01/2014 9:53:10 AM By: Christin Fudge MD, FACS Entered By: Christin Fudge on 11/01/2014 09:53:10 [name] ([id number]) -------------------------------------------------------------------------------- Physical Exam Details Patient Name: IVEN, EARNHART. Date of Service: 11/01/2014 9:30 AM Medical Record Number: [id number] Patient Account Number: [id number] Date of Birth/Sex: [birth year] (81 y.o. Male) Treating RN: Primary Care Physician: Hortencia Pilar Other Clinician: Referring Physician: Hortencia Pilar Treating Physician/Extender: Frann Rider in Treatment: 26 Constitutional . Pulse regular. Respirations normal and unlabored. Afebrile. . Eyes Nonicteric. Reactive to light. Ears, Nose, Mouth, and Throat Lips, teeth, and gums WNL.Marland Kitchen Moist mucosa without lesions . Neck supple and nontender. No palpable supraclavicular or cervical adenopathy. Normal sized without goiter. Respiratory WNL. No retractions.. Cardiovascular Pedal Pulses WNL. No clubbing, cyanosis or edema. Chest Breasts symmetical and no nipple discharge.. Breast tissue WNL, no masses, lumps, or tenderness.. Musculoskeletal Adexa without tenderness or enlargement.. Digits and nails w/o clubbing, cyanosis, infection, petechiae, ischemia, or inflammatory conditions.. Integumentary (Hair, Skin) No suspicious lesions. No crepitus or fluctuance. No peri-wound warmth or erythema. No masses.Marland Kitchen Psychiatric Judgement and insight Intact.. No evidence of depression, anxiety, or agitation.. Notes except for the left foot which has slough the rest of the wounds are very clean. Electronic Signature(s) Signed: 11/01/2014 9:55:29 AM By: Christin Fudge MD, FACS Entered By: Christin Fudge on 11/01/2014 09:55:29 [name].  (  [id number]) -------------------------------------------------------------------------------- Physician Orders Details Patient Name: DALESSANDRO, BALDYGA. Date of Service: 11/01/2014 9:30 AM Medical Record Number: [id number] Patient Account Number: [id number] Date of Birth/Sex: 1918-07-02 (81 y.o. Male) Treating RN: Cornell Barman Primary Care Physician: Hortencia Pilar Other Clinician: Referring Physician: Hortencia Pilar Treating Physician/Extender: Frann Rider in Treatment: 82 Verbal / Phone Orders: Yes Clinician: Cornell Barman Read Back and Verified: No Diagnosis Coding Wound Cleansing Wound #3 Right,Dorsal Foot o Cleanse wound with mild soap and water o May Shower, gently pat wound dry prior to applying new dressing. o May shower with protection. Wound #4 Left,Medial Malleolus o Cleanse wound with mild soap and water o May Shower, gently pat wound dry prior to applying new dressing. o May shower with protection. Wound #5 Right,Lateral Forearm o Cleanse wound with mild soap and water o May Shower, gently pat wound dry prior to applying new dressing. o May shower with protection. Anesthetic Wound #3 Right,Dorsal Foot o Topical Lidocaine 4% cream applied to wound bed prior to debridement Wound #4 Left,Medial Malleolus o Topical Lidocaine 4% cream applied to wound bed prior to debridement Skin Barriers/Peri-Wound Care Wound #3 Right,Dorsal Foot o Skin Prep Wound #4 Left,Medial Malleolus o Skin Prep Wound #5 Right,Lateral Forearm o Skin Prep Primary Wound Dressing Wound #3 Right,Dorsal Foot o Prisma Ag BROXTON, BROADY. ([id number]) Wound #4 Left,Medial Malleolus o Hydrafera Blue - Cut hydrofera blue to fit wound. Wound #5 Right,Lateral Forearm o Other: - mepitel Secondary Dressing Wound #3 Right,Dorsal Foot o Boardered Foam Dressing Wound #4 Left,Medial Malleolus o Boardered Foam Dressing Wound #5 Right,Lateral Forearm o Boardered  Foam Dressing Dressing Change Frequency Wound #3 Right,Dorsal Foot o Change dressing every other day. Wound #4 Left,Medial Malleolus o Change dressing every other day. Wound #5 Right,Lateral Forearm o Change dressing every other day. Follow-up Appointments Wound #3 Right,Dorsal Foot o Return Appointment in 1 week. Wound #4 Left,Medial Malleolus o Return Appointment in 1 week. Wound #5 Right,Lateral Forearm o Return Appointment in 1 week. Home Health Wound #3 [place] Visits - [place] Nurse may visit PRN to address patientos wound care needs. o FACE TO FACE ENCOUNTER: MEDICARE and MEDICAID PATIENTS: I certify that this patient is under my care and that I had a face-to-face encounter that meets the physician face-to-face encounter requirements with this patient on this date. The encounter with the patient was in whole or in part for the following MEDICAL CONDITION: (primary reason for [place]) MEDICAL NECESSITY: I certify, that based on my findings, NURSING services are a medically necessary home health service. HOME BOUND STATUS: I certify that my clinical findings support that this patient is homebound (i.e., Due to illness or injury, pt requires aid of MADDAX, PALINKAS. ([id number]) supportive devices such as crutches, cane, wheelchairs, walkers, the use of special transportation or the assistance of another person to leave their place of residence. There is a normal inability to leave the home and doing so requires considerable and taxing effort. Other absences are for medical reasons / religious services and are infrequent or of short duration when for other reasons). o If current dressing causes regression in wound condition, may D/C ordered dressing product/s and apply Normal Saline Moist Dressing daily until next [place] / Other MD appointment. [place] of  regression in wound condition at [phone]. o Please direct any NON-WOUND related issues/requests for orders to patient's Primary Care Physician Wound #4 Left,Medial Malleolus o  Continue Home Health Visits - [place] Nurse may visit PRN to address patientos wound care needs. o FACE TO FACE ENCOUNTER: MEDICARE and MEDICAID PATIENTS: I certify that this patient is under my care and that I had a face-to-face encounter that meets the physician face-to-face encounter requirements with this patient on this date. The encounter with the patient was in whole or in part for the following MEDICAL CONDITION: (primary reason for [place]) MEDICAL NECESSITY: I certify, that based on my findings, NURSING services are a medically necessary home health service. HOME BOUND STATUS: I certify that my clinical findings support that this patient is homebound (i.e., Due to illness or injury, pt requires aid of supportive devices such as crutches, cane, wheelchairs, walkers, the use of special transportation or the assistance of another person to leave their place of residence. There is a normal inability to leave the home and doing so requires considerable and taxing effort. Other absences are for medical reasons / religious services and are infrequent or of short duration when for other reasons). o If current dressing causes regression in wound condition, may D/C ordered dressing product/s and apply Normal Saline Moist Dressing daily until next [place] / Other MD appointment. [place] of regression in wound condition at [phone]. o Please direct any NON-WOUND related issues/requests for orders to patient's Primary [place] Visits - [place] Nurse may visit PRN to address patientos wound care needs. o FACE TO FACE ENCOUNTER: MEDICARE and MEDICAID PATIENTS: I certify that  this patient is under my care and that I had a face-to-face encounter that meets the physician face-to-face encounter requirements with this patient on this date. The encounter with the patient was in whole or in part for the following MEDICAL CONDITION: (primary reason for [place]) MEDICAL NECESSITY: I certify, that based on my findings, NURSING services are a medically necessary home health service. HOME BOUND STATUS: I certify that my clinical findings support that this patient is homebound (i.e., Due to illness or injury, pt requires aid of supportive devices such as crutches, cane, wheelchairs, walkers, the use of special transportation or the assistance of another person to leave their place of residence. There is a normal inability to leave the home and doing so requires considerable and taxing effort. Other absences are for medical reasons / religious services and are infrequent or of short duration when for other reasons). o If current dressing causes regression in wound condition, may D/C ordered dressing product/s and apply Normal Saline Moist Dressing daily until next [place] / Other MD appointment. [place] of regression in wound condition at [phone]. o Please direct any NON-WOUND related issues/requests for orders to patient's Primary Care Physician MICAJAH, DENNIN ([id number]) Wound #5 Right,Lateral Forearm o [place] Nurse may visit PRN to address patientos wound care needs. o FACE TO FACE ENCOUNTER: MEDICARE and MEDICAID PATIENTS: I certify that this patient is under my care and that I had a face-to-face encounter that meets the physician face-to-face encounter requirements with this patient on this date. The encounter with the patient was in whole or in part for the following MEDICAL CONDITION: (primary reason for [place]) MEDICAL NECESSITY: I certify, that  based on my findings, NURSING services are a medically necessary home health service. HOME BOUND STATUS: I certify that my clinical findings support  that this patient is homebound (i.e., Due to illness or injury, pt requires aid of supportive devices such as crutches, cane, wheelchairs, walkers, the use of special transportation or the assistance of another person to leave their place of residence. There is a normal inability to leave the home and doing so requires considerable and taxing effort. Other absences are for medical reasons / religious services and are infrequent or of short duration when for other reasons). o If current dressing causes regression in wound condition, may D/C ordered dressing product/s and apply Normal Saline Moist Dressing daily until next [place] / Other MD appointment. [place] of regression in wound condition at [phone]. o Please direct any NON-WOUND related issues/requests for orders to patient's Primary [place] Visits - [place] Nurse may visit PRN to address patientos wound care needs. o FACE TO FACE ENCOUNTER: MEDICARE and MEDICAID PATIENTS: I certify that this patient is under my care and that I had a face-to-face encounter that meets the physician face-to-face encounter requirements with this patient on this date. The encounter with the patient was in whole or in part for the following MEDICAL CONDITION: (primary reason for [place]) MEDICAL NECESSITY: I certify, that based on my findings, NURSING services are a medically necessary home health service. HOME BOUND STATUS: I certify that my clinical findings support that this patient is homebound (i.e., Due to illness or injury, pt requires aid of supportive devices such as crutches, cane, wheelchairs, walkers, the use of special transportation or the assistance of another person to leave their place  of residence. There is a normal inability to leave the home and doing so requires considerable and taxing effort. Other absences are for medical reasons / religious services and are infrequent or of short duration when for other reasons). o If current dressing causes regression in wound condition, may D/C ordered dressing product/s and apply Normal Saline Moist Dressing daily until next [place] / Other MD appointment. [place] of regression in wound condition at [phone]. o Please direct any NON-WOUND related issues/requests for orders to patient's Primary Care Physician Electronic Signature(s) Signed: 11/01/2014 12:17:31 PM By: Christin Fudge MD, FACS Signed: 11/01/2014 5:11:11 PM By: Gretta Cool RN, BSN, Kim RN, BSN Entered By: Gretta Cool, RN, BSN, Kim on 11/01/2014 09:50:09 [name] ([id number]) -------------------------------------------------------------------------------- Problem List Details Patient Name: OLIS, VIVERETTE. Date of Service: 11/01/2014 9:30 AM Medical Record Number: [id number] Patient Account Number: [id number] Date of Birth/Sex: 24-Oct-1918 (81 y.o. Male) Treating RN: Primary Care Physician: Hortencia Pilar Other Clinician: Referring Physician: Hortencia Pilar Treating Physician/Extender: Frann Rider in Treatment: 26 Active Problems ICD-10 Encounter Code Description Active Date Diagnosis I70.232 Atherosclerosis of native arteries of right leg with 06/21/2014 Yes ulceration of calf I82.401 Acute embolism and thrombosis of unspecified deep veins 06/21/2014 Yes of right lower extremity S91.301A Unspecified open wound, right foot, initial encounter 07/19/2014 Yes Z92.21 Personal history of antineoplastic chemotherapy 08/23/2014 Yes L97.522 Non-pressure chronic ulcer of other part of left foot with fat 09/20/2014 Yes layer exposed S41.111A Laceration without foreign body of right upper arm, initial 10/04/2014 Yes encounter Inactive  Problems Resolved Problems ICD-10 Code Description Active Date Resolved Date L97.212 Non-pressure chronic ulcer of right calf with fat layer 06/21/2014 06/21/2014 exposed S51.812A Laceration without foreign body of left forearm, initial 06/21/2014 06/21/2014 encounter [name] ([id number]) Electronic Signature(s) Signed: 11/01/2014 9:52:54 AM By: Christin Fudge MD, FACS Entered By: Con Memos  Cache Decoursey on 11/01/2014 09:52:54 THOMAS, MABRY ([id number]) -------------------------------------------------------------------------------- Progress Note Details Patient Name: COLSON, BARCO. Date of Service: 11/01/2014 9:30 AM Medical Record Number: [id number] Patient Account Number: [id number] Date of Birth/Sex: 03-29-1919 (81 y.o. Male) Treating RN: Primary Care Physician: Hortencia Pilar Other Clinician: Referring Physician: Hortencia Pilar Treating Physician/Extender: Frann Rider in Treatment: 38 Subjective Chief Complaint Information obtained from Patient R foot ulcer. L forearm ulcer. 07/19/2014 -- about 2 weeks ago he had a surgical procedure done by dermatologist in [place] and has an open surgical wound on the dorsum of the right foot. History of Present Illness (HPI) The following HPI elements were documented for the patient's wound: Location: right leg Duration: Dec 2015 Modifying Factors: history of an injury to the right leg with resulting hematoma and thrombophlebitis and later an ulcer of posterior leg Associated Signs and Symptoms: marked lymphedema of the right leg. He is already on Eloquis. 06/14/14 -- He returns for followup today. He denies any fevers. no fresh issues and his daughter says he's been doing fine. 06/21/14 -- after he sustained a fall this week earlier he applied a bandage over this himself and did not seek any medical attention. he did however manage to control the bleeding and had a dressing in place the next morning when his son to the visit. In this  dressing was removed there was further damaged skin. His right leg has been doing fine otherwise. 07/12/14 --Very pleasant 81 year old with past medical history significant for congestive heart failure (EF 15%), peripheral vascular disease, and chronic kidney disease. He was hospitalized at [facility] in December 2015 for congestive heart failure. He says that he fell during his hospital course and developed a hematoma over his right calf. He was also diagnosed with a right lower extremity DVT for which he takes Eliquis. The hematoma subsequently turned into an ulceration around Christmas, which has healed. He subsequently developed an ulcer on his right dorsal foot and a traumatic left forearm ulcer. Per his report, he underwent biopsy of the right dorsal foot ulceration which demonstrated a skin cancer. His PCP and dermatologist office are both closed today. I reviewed his records in [place] but find no report of biopsy or pathology. He s without complaints today. No significant pain. No fever or chills. Minimal drainage. 07/19/2014 - the patient and his son tell me that about 2 weeks ago the dermatologist did a skin biopsy and this was a large area on the dorsum of his right foot which was left open and no dressing instructions were recommended. Since then he has been called and told that it is a cancer and the need to do a further procedure but that will not happen until about 2 weeks from now. In the meanwhile the patient has not been taking care of his right foot. The left forearm where he had an abrasion and laceration is doing very well. HERACLIO, SEIDMAN ([id number]) 07/26/2014 -- Reports from 07/08/2014 from the dermatology group reviewed. A excision was done of a lesion located on the dorsum of the right foot and this was 1.7 cm in diameter which was a shave biopsy performed. The wound was left open after appropriate cauterization and the patient was given this dressing instructions. 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 08/02/2014 -- all the original problems he came with have completely resolved. He now has a surgical wound on his right foot dorsum  where a skin cancer was excised. He goes to see his dermatologist this coming Tuesday and will have definite news next Friday. 08/09/2014 he had gone to his dermatologist on Tuesday and she has injected the base of his ulcer with some chemotherapeutic agent. He was supposed to bring some papers with him but forgot to get them and will bring them in next week. Other than that the dermatologist had suggested using Mehdi honey on the wound. 08/16/2014 -- the patient has brought in his notes from the dermatologist and on 08/06/2014 he received a injection of 5 FU, 500 mg grams into the lesion. the pathology report was also sent and it was a squamous cell carcinoma well-differentiated and edges were involved. They wanted him to use many honey for the wound dressing changes to be done 3 times a week. 08/30/2014 -- he has finished his second injection of 5-FU and has the next one in 2 weeks' time. He is doing fine otherwise. 09/13/2014 - No new complaints. No significant pain. No fever or chills. Minimal drainage. Still receiving 5-FU injections. 09/20/2014 -- He was seen by the dermatologist on 09/10/2014 and Dr. Phillip Heal injected his foot both the right on the dorsum and left near the medial malleolus with 5-FU. The next dose of 5-FU is to be given after 3 months. The patient says he now has a spot on the left medial malleolus where he was injected with 5-FU. 09/27/2014 -- the area on the left ankle where he was injected with 5-FU is now a full-blown ulcer. He also has mild pain in both ankle areas. 10/04/2014 - large had a bit of a fall and injured his right arm last evening and has had a laceration with no evidence of any foreign body in  the right forearm. Objective Constitutional Pulse regular. Respirations normal and unlabored. Afebrile. Vitals Time Taken: 9:28 AM, Height: 69 in, Weight: 179 lbs, BMI: 26.4, Temperature: 97.9 F, Pulse: 63 bpm, Respiratory Rate: 18 breaths/min, Blood Pressure: 114/44 mmHg. ADOLPHO, MEENACH ([id number]) Eyes Nonicteric. Reactive to light. Ears, Nose, Mouth, and Throat Lips, teeth, and gums WNL.Marland Kitchen Moist mucosa without lesions . Neck supple and nontender. No palpable supraclavicular or cervical adenopathy. Normal sized without goiter. Respiratory WNL. No retractions.. Cardiovascular Pedal Pulses WNL. No clubbing, cyanosis or edema. Chest Breasts symmetical and no nipple discharge.. Breast tissue WNL, no masses, lumps, or tenderness.. Musculoskeletal Adexa without tenderness or enlargement.. Digits and nails w/o clubbing, cyanosis, infection, petechiae, ischemia, or inflammatory conditions.Marland Kitchen Psychiatric Judgement and insight Intact.. No evidence of depression, anxiety, or agitation.. General Notes: except for the left foot which has slough the rest of the wounds are very clean. Integumentary (Hair, Skin) No suspicious lesions. No crepitus or fluctuance. No peri-wound warmth or erythema. No masses.. Wound #3 status is Open. Original cause of wound was Surgical Injury. The wound is located on the Right,Dorsal Foot. The wound measures 1cm length x 1cm width x 0.2cm depth; 0.785cm^2 area and 0.157cm^3 volume. The wound is limited to skin breakdown. There is a medium amount of serosanguineous drainage noted. The wound margin is distinct with the outline attached to the wound base. There is small (1-33%) pink granulation within the wound bed. There is a medium (34-66%) amount of necrotic tissue within the wound bed including Adherent Slough. The periwound skin appearance exhibited: Localized Edema, Moist. The periwound skin appearance did not exhibit: Callus, Crepitus,  Excoriation, Fluctuance, Friable, Induration, Rash, Scarring, Dry/Scaly, Maceration, Atrophie Blanche, Cyanosis, Ecchymosis, Hemosiderin Staining, Mottled, Pallor, Rubor,  Erythema. Periwound temperature was noted as No Abnormality. Wound #4 status is Open. Original cause of wound was Other Lesion. The wound is located on the Left,Medial Malleolus. The wound measures 1.5cm length x 2.2cm width x 0.3cm depth; 2.592cm^2 area and 0.778cm^3 volume. The wound is limited to skin breakdown. There is a small amount of serosanguineous drainage noted. The wound margin is distinct with the outline attached to the wound base. There is small (1-33%) pink granulation within the wound bed. There is a medium (34-66%) amount of necrotic tissue within the wound bed including Adherent Slough. The periwound skin appearance exhibited: Localized Edema, Moist. The periwound skin appearance did not exhibit: Callus, Crepitus, Excoriation, Fluctuance, Friable, Induration, Rash, Scarring, Dry/Scaly, Maceration, Atrophie Blanche, Cyanosis, Ecchymosis, Hemosiderin Staining, Mottled, Pallor, Rubor, Erythema. Periwound temperature was noted as TYLIQUE, AULL. ([id number]) No Abnormality. The periwound has tenderness on palpation. Wound #5 status is Open. Original cause of wound was Shear/Friction. The wound is located on the Right,Lateral Forearm. The wound measures 1cm length x 0.5cm width x 0.1cm depth; 0.393cm^2 area and 0.039cm^3 volume. The wound is limited to skin breakdown. There is a small amount of serous drainage noted. The wound margin is distinct with the outline attached to the wound base. There is large (67-100%) red granulation within the wound bed. There is no necrotic tissue within the wound bed. The periwound skin appearance exhibited: Dry/Scaly. The periwound skin appearance did not exhibit: Callus, Crepitus, Excoriation, Fluctuance, Friable, Induration, Localized Edema, Rash, Scarring, Maceration, Moist,  Atrophie Blanche, Cyanosis, Ecchymosis, Hemosiderin Staining, Mottled, Pallor, Rubor, Erythema. Periwound temperature was noted as No Abnormality. 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 On the left foot we will use Hydrofera Blue and on the right we will continue to use Prisma. We will see him back next week. Plan Wound Cleansing: Wound #3 Right,Dorsal Foot: Cleanse wound with mild soap and water May Shower, gently pat wound dry prior to applying new dressing. May shower with protection. Wound #4 Left,Medial Malleolus: Cleanse wound with mild soap and water May Shower, gently pat wound dry prior to applying new dressing. May shower with protection. Wound #5 Right,Lateral Forearm: DARYON, REMMERT ([id number]) Cleanse wound with mild soap and water May Shower, gently pat wound dry prior to applying new dressing. May shower with protection. Anesthetic: Wound #3 Right,Dorsal Foot: Topical Lidocaine 4% cream applied to wound bed prior to debridement Wound #4 Left,Medial Malleolus: Topical Lidocaine 4% cream applied to wound bed prior to debridement Skin Barriers/Peri-Wound Care: Wound #3 Right,Dorsal Foot: Skin Prep Wound #4 Left,Medial Malleolus: Skin Prep Wound #5 Right,Lateral Forearm: Skin Prep Primary Wound Dressing: Wound #3 Right,Dorsal Foot: Prisma Ag Wound #4 Left,Medial Malleolus: Hydrafera Blue - Cut hydrofera blue to fit  wound. Wound #5 Right,Lateral Forearm: Other: - mepitel Secondary Dressing: Wound #3 Right,Dorsal Foot: Boardered Foam Dressing Wound #4 Left,Medial Malleolus: Boardered Foam Dressing Wound #5 Right,Lateral Forearm: Boardered Foam Dressing Dressing Change Frequency: Wound #3 Right,Dorsal Foot: Change dressing every other day. Wound #4 Left,Medial Malleolus: Change  dressing every other day. Wound #5 Right,Lateral Forearm: Change dressing every other day. Follow-up Appointments: Wound #3 Right,Dorsal Foot: Return Appointment in 1 week. Wound #4 Left,Medial Malleolus: Return Appointment in 1 week. Wound #5 Right,Lateral Forearm: Return Appointment in 1 week. Home Health: Wound #3 Right,Dorsal Foot: [place] Visits - [place] Nurse may visit PRN to address patient s wound care needs. FACE TO FACE ENCOUNTER: MEDICARE and MEDICAID PATIENTS: I certify that this patient is under my care and that I had a face-to-face encounter that meets the physician face-to-face encounter requirements with this patient on this date. The encounter with the patient was in whole or in part for the following MEDICAL CONDITION: (primary reason for [place]) MEDICAL NECESSITY: I certify, BURLIE, CAJAMARCA ([id number]) that based on my findings, NURSING services are a medically necessary home health service. HOME BOUND STATUS: I certify that my clinical findings support that this patient is homebound (i.e., Due to illness or injury, pt requires aid of supportive devices such as crutches, cane, wheelchairs, walkers, the use of special transportation or the assistance of another person to leave their place of residence. There is a normal inability to leave the home and doing so requires considerable and taxing effort. Other absences are for medical reasons / religious services and are infrequent or of short duration when for other reasons). If current dressing causes  regression in wound condition, may D/C ordered dressing product/s and apply Normal Saline Moist Dressing daily until next [place] / Other MD appointment. [place] of regression in wound condition at [phone]. Please direct any NON-WOUND related issues/requests for orders to patient's Primary Care Physician Wound #4 Left,Medial Malleolus: [place] Visits - [place] Nurse may visit PRN to address patient s wound care needs. FACE TO FACE ENCOUNTER: MEDICARE and MEDICAID PATIENTS: I certify that this patient is under my care and that I had a face-to-face encounter that meets the physician face-to-face encounter requirements with this patient on this date. The encounter with the patient was in whole or in part for the following MEDICAL CONDITION: (primary reason for [place]) MEDICAL NECESSITY: I certify, that based on my findings, NURSING services are a medically necessary home health service. HOME BOUND STATUS: I certify that my clinical findings support that this patient is homebound (i.e., Due to illness or injury, pt requires aid of supportive devices such as crutches, cane, wheelchairs, walkers, the use of special transportation or the assistance of another person to leave their place of residence. There is a normal inability to leave the home and doing so requires considerable and taxing effort. Other absences are for medical reasons / religious services and are infrequent or of short duration when for other reasons). If current dressing causes regression in wound condition, may D/C ordered dressing product/s and apply Normal Saline Moist Dressing daily until next [place] / Other MD appointment. [place] of regression in wound condition at [phone]. Please direct any NON-WOUND related issues/requests for orders to patient's [place] Visits -  [place] Nurse may visit PRN to address patient s wound care needs. FACE TO FACE ENCOUNTER: MEDICARE and MEDICAID PATIENTS: I certify that this patient is under my care and that I had a face-to-face encounter that meets the physician face-to-face encounter requirements with this patient on this date. The encounter with the patient was in whole or in  part for the following MEDICAL CONDITION: (primary reason for Home Healthcare) MEDICAL NECESSITY: I certify, that based on my findings, NURSING services are a medically necessary home health service. HOME BOUND STATUS: I certify that my clinical findings support that this patient is homebound (i.e., Due to illness or injury, pt requires aid of supportive devices such as crutches, cane, wheelchairs, walkers, the use of special transportation or the assistance of another person to leave their place of residence. There is a normal inability to leave the home and doing so requires considerable and taxing effort. Other absences are for medical reasons / religious services and are infrequent or of short duration when for other reasons). If current dressing causes regression in wound condition, may D/C ordered dressing product/s and apply Normal Saline Moist Dressing daily until next [place] / Other MD appointment. [place] of regression in wound condition at [phone]. Please direct any NON-WOUND related issues/requests for orders to patient's Primary Care Physician Wound #5 Right,Lateral Forearm: [place] Nurse may visit PRN to address patient s wound care needs. FACE TO FACE ENCOUNTER: MEDICARE and MEDICAID PATIENTS: I certify that this patient is under my care and that I had a face-to-face encounter that meets the physician face-to-face encounter requirements with this patient on this date. The encounter with the patient was in whole or in  part for the following MEDICAL CONDITION: (primary reason for [place]) MEDICAL NECESSITY: I certify, that based on my findings, NURSING services are a medically necessary home health service. [place] ([id number]) BOUND STATUS: I certify that my clinical findings support that this patient is homebound (i.e., Due to illness or injury, pt requires aid of supportive devices such as crutches, cane, wheelchairs, walkers, the use of special transportation or the assistance of another person to leave their place of residence. There is a normal inability to leave the home and doing so requires considerable and taxing effort. Other absences are for medical reasons / religious services and are infrequent or of short duration when for other reasons). If current dressing causes regression in wound condition, may D/C ordered dressing product/s and apply Normal Saline Moist Dressing daily until next [place] / Other MD appointment. [place] of regression in wound condition at [phone]. Please direct any NON-WOUND related issues/requests for orders to patient's [place] Visits - [place] Nurse may visit PRN to address patient s wound care needs. FACE TO FACE ENCOUNTER: MEDICARE and MEDICAID PATIENTS: I certify that this patient is under my care and that I had a face-to-face encounter that meets the physician face-to-face encounter requirements with this patient on this date. The encounter with the patient was in whole or in part for the following MEDICAL CONDITION: (primary reason for [place]) MEDICAL NECESSITY: I certify, that based on my findings, NURSING services are a medically necessary home health service. HOME BOUND STATUS: I certify that my clinical findings support that this patient is homebound (i.e., Due to illness or injury, pt requires aid of supportive devices such as  crutches, cane, wheelchairs, walkers, the use of special transportation or the assistance of another person to leave their place of residence. There is a normal inability to leave the home and doing so requires considerable and taxing effort. Other absences are for medical reasons / religious services and are infrequent or of short duration when for other reasons).  If current dressing causes regression in wound condition, may D/C ordered dressing product/s and apply Normal Saline Moist Dressing daily until next [place] / Other MD appointment. [place] of regression in wound condition at [phone]. Please direct any NON-WOUND related issues/requests for orders to patient's Primary Care Physician The right forearm looks very good and we will have a border foam dressing over this. On the left foot we will use Hydrofera Blue and on the right we will continue to use Prisma. We will see him back next week. Electronic Signature(s) Signed: 11/01/2014 9:56:36 AM By: Christin Fudge MD, FACS Previous Signature: 11/01/2014 9:56:27 AM Version By: Christin Fudge MD, FACS Entered By: Christin Fudge on 11/01/2014 09:56:36 [name] ([id number]) -------------------------------------------------------------------------------- SuperBill Details Patient Name: HUTTON, PELLICANE. Date of Service: 11/01/2014 Medical Record Number: [id number] Patient Account Number: [id number] Date of Birth/Sex: 1919/04/12 (81 y.o. 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 I82.401 Acute embolism and thrombosis of unspecified deep veins of right lower extremity S91.301A Unspecified open wound, right foot, initial encounter Z92.21  Personal history of antineoplastic chemotherapy L97.522 Non-pressure chronic ulcer of other part of left foot with fat layer exposed S41.111A Laceration without foreign body of right upper arm, initial encounter Facility Procedures The patient participates with Medicare or their insurance follows the Medicare Facility Guidelines: CPT4 Code Description Modifier Quantity [id number] [place] VISIT-LEV 4 EST PT 1 Physician Procedures CPT4 Code Description: [id number] - WC PHYS LEVEL 3 - EST PT ICD-10 Description Diagnosis S91.301A Unspecified open wound, right foot, initial encounter L97.522 Non-pressure chronic ulcer of other part of left foot w Modifier: ith fat laye Quantity: 1 r exposed Electronic Signature(s) Signed: 11/01/2014 12:17:31 PM By: Christin Fudge MD, FACS Signed: 11/01/2014 5:11:11 PM By: Gretta Cool RN, BSN, Kim RN, BSN Previous Signature: 11/01/2014 9:57:04 AM Version By: Christin Fudge MD, FACS Entered By: Gretta Cool, RN, BSN, Kim on 11/01/2014 10:03:03

## 2014-11-08 ENCOUNTER — Encounter: Payer: Medicare Other | Admitting: Surgery

## 2014-11-08 DIAGNOSIS — L97521 Non-pressure chronic ulcer of other part of left foot limited to breakdown of skin: Secondary | ICD-10-CM | POA: Diagnosis not present

## 2014-11-09 NOTE — Progress Notes (Signed)
Philip [name] ([id number]) Visit Report for 11/08/2014 Arrival Information Details Patient Name: Philip [name], Philip [name]. Date of Service: 11/08/2014 9:30 AM Medical Record Number: [id number] Patient Account Number: [id number] Date of Birth/Sex: [birth year] (81 y.o. Male) Treating RN: Baruch Gouty, RN, BSN, Velva Harman Primary Care Physician: Hortencia Pilar Other Clinician: Referring Physician: Hortencia Pilar Treating Physician/Extender: Frann Rider in Treatment: 27 Visit Information History Since Last Visit Any new allergies or adverse reactions: No Patient Arrived: Philip [name] Had a fall or experienced change in No Arrival Time: 09:43 activities of daily living that may affect Accompanied By: dtr risk of falls: Transfer Assistance: None Signs or symptoms of abuse/neglect since last No Patient Requires Transmission- No visito Based Precautions: Has Dressing in Place as Prescribed: Yes Patient Has Alerts: Yes Pain Present Now: No Patient Alerts: Patient on Blood Thinner No ABIs dt LE blood clots Electronic Signature(s) Signed: 11/08/2014 2:16:09 PM By: Regan Lemming BSN, RN Entered By: Regan Lemming on 11/08/2014 09:45:40 Philip [name] ([id number]) -------------------------------------------------------------------------------- Encounter Discharge Information Details Patient Name: Philip [name], Philip [name]. Date of Service: 11/08/2014 9:30 AM Medical Record Number: [id number] Patient Account Number: [id number] Date of Birth/Sex: 1918-10-30 (81 y.o. Male) Treating RN: Baruch Gouty, RN, BSN, Velva Harman Primary Care Physician: Hortencia Pilar Other Clinician: Referring Physician: Hortencia Pilar Treating Physician/Extender: Frann Rider in Treatment: 57 Encounter Discharge Information Items Discharge Pain Level: 0 Discharge Condition: Stable Ambulatory Status: Cane Discharge Destination: Home Private Transportation: Auto Accompanied By: dtr Schedule Follow-up Appointment: No Medication Reconciliation  completed and No provided to Patient/Care Greenlee Ancheta: Clinical Summary of Care: Electronic Signature(s) Signed: 11/08/2014 2:16:09 PM By: Regan Lemming BSN, RN Entered By: Regan Lemming on 11/08/2014 10:23:50 Philip [name] ([id number]) -------------------------------------------------------------------------------- Lower Extremity Assessment Details Patient Name: Philip [name], Philip [name]. Date of Service: 11/08/2014 9:30 AM Medical Record Number: [id number] Patient Account Number: [id number] Date of Birth/Sex: [birth year] (81 y.o. Male) Treating RN: Baruch Gouty, RN, BSN, Velva Harman Primary Care Physician: Hortencia Pilar Other Clinician: Referring Physician: Hortencia Pilar Treating Physician/Extender: Frann Rider in Treatment: 27 Vascular Assessment Pulses: Posterior Tibial Dorsalis Pedis Palpable: [Left:Yes] [Right:Yes] Extremity colors, hair growth, and conditions: Extremity Color: [Left:Mottled] [Right:Mottled] Hair Growth on Extremity: [Left:No] [Right:No] Temperature of Extremity: [Left:Warm] [Right:Warm] Capillary Refill: [Left:< 3 seconds] [Right:< 3 seconds] Toe Nail Assessment Left: Right: Thick: Yes Yes Discolored: Yes Yes Deformed: No No Improper Length and Hygiene: No No Electronic Signature(s) Signed: 11/08/2014 2:16:09 PM By: Regan Lemming BSN, RN Entered By: Regan Lemming on 11/08/2014 09:48:38 Philip [name] ([id number]) -------------------------------------------------------------------------------- Multi Wound Chart Details Patient Name: Philip [name]. Date of Service: 11/08/2014 9:30 AM Medical Record Number: [id number] Patient Account Number: [id number] Date of Birth/Sex: [birth year] (81 y.o. 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] [4:Malignant Wound] [5:Skin Tear] Comorbid History: [3:Cataracts, Arrhythmia, Congestive Heart Failure] [4:Cataracts, Arrhythmia, Congestive Heart Failure] [5:Cataracts, Arrhythmia, Congestive Heart Failure] Date Acquired: [3:07/01/2014] [4:09/20/2014] [5:10/01/2014] Weeks of Treatment: [3:17] [4:7] [5:5] Wound Status: [3:Open] [4:Open] [5:Open] Measurements L x W x D 1x1x0.2 [4:1.7x2x0.5] [5:4.5x3x0.1] (cm) Area (cm) : [3:0.785] [4:2.67] [5:10.603] Volume (cm) : [3:0.157] [4:1.335] [5:1.06] % Reduction in Area: [3:58.40%] [4:-1600.60%] [5:-68.80%] %  Reduction in Volume: 79.20% [4:-8243.70%] [5:-68.80%] Classification: [3:Full Thickness Without Exposed Support Structures] [4:Partial Thickness] [5:Partial Thickness] Exudate Amount: [3:Medium] [4:Small] [5:Small] Exudate Type: [3:Serosanguineous] [4:Serosanguineous] [5:Serous] Exudate Color: [3:red, brown] [4:red, brown] [5:amber] Wound Margin: [3:Distinct, outline attached] [4:Distinct, outline attached] [5:Distinct, outline attached] Granulation Amount: [3:Small (1-33%)] [4:Small (1-33%)] [5:Large (67-100%)] Granulation Quality: [3:Pink] [4:Pink] [5:Red] Necrotic Amount: [3:Medium (34-66%)] [4:Medium (34-66%)] [5:None Present (0%)] Exposed Structures: [3:Fascia: No Fat: No Tendon: No Muscle: No Joint: No] [4:Fascia: No Fat: No Tendon: No Muscle: No Joint: No] [5:Fascia: No Fat: No Tendon: No Muscle: No Joint: No] Bone: No Bone: No Bone: No Limited to Skin Limited to Skin Limited to Skin Breakdown Breakdown Breakdown Epithelialization: None None Large (67-100%) Periwound  Skin Texture: Edema: Yes Edema: Yes Edema: No Excoriation: No Excoriation: No Excoriation: No Induration: No Induration: No Induration: No Callus: No Callus: No Callus: No Crepitus: No Crepitus: No Crepitus: No Fluctuance: No Fluctuance: No Fluctuance: No Friable: No Friable: No Friable: No Rash: No Rash: No Rash: No Scarring: No Scarring: No Scarring: No Periwound Skin Moist: Yes Moist: Yes Moist: Yes Moisture: Maceration: No Maceration: No Maceration: No Dry/Scaly: No Dry/Scaly: No Dry/Scaly: No Periwound Skin Color: Atrophie Blanche: No Atrophie Blanche: No Atrophie Blanche: No Cyanosis: No Cyanosis: No Cyanosis: No Ecchymosis: No Ecchymosis: No Ecchymosis: No Erythema: No Erythema: No Erythema: No Hemosiderin Staining: No Hemosiderin Staining: No Hemosiderin Staining: No Mottled: No Mottled: No Mottled: No Pallor: No Pallor: No Pallor: No Rubor: No Rubor: No Rubor: No Temperature: No Abnormality No Abnormality No Abnormality Tenderness on No Yes No Palpation: Wound Preparation: Ulcer Cleansing: Ulcer Cleansing: Ulcer Cleansing: Rinsed/Irrigated with Rinsed/Irrigated with Rinsed/Irrigated with Saline Saline Saline Topical Anesthetic Topical Anesthetic Topical Anesthetic Applied: Other: lidocaine Applied: Other: lidocaine Applied: Other: lidocaine 4% 4% 4% Treatment Notes Electronic Signature(s) Signed: 11/08/2014 5:03:22 PM By: Gretta Cool, RN, BSN, Kim RN, BSN Entered By: Gretta Cool, RN, BSN, Kim on 11/08/2014 09:59:37 Philip [name] ([id number]) -------------------------------------------------------------------------------- [place] Details Patient Name: Philip [name], Philip [name]. Date of Service: 11/08/2014 9:30 AM Medical Record Number: [id number] Patient Account Number: [id number] Date of Birth/Sex: 1919/03/28 (81 y.o. Male) Treating RN: Cornell Barman Primary Care Physician: Hortencia Pilar Other Clinician: Referring Physician:  Hortencia Pilar Treating Physician/Extender: Frann Rider in Treatment: 38 Active Inactive Abuse / Safety / Falls / Self Care Management Nursing Diagnoses: Abuse or neglect; actual or potential Potential for falls Goals: Patient will remain injury free Date Initiated: 05/02/2014 Goal Status: Active Interventions: Assess fall risk on admission and as needed Notes: Necrotic Tissue Nursing Diagnoses: Impaired tissue integrity related to necrotic/devitalized tissue Goals: Necrotic/devitalized tissue will be minimized in the wound bed Date Initiated: 05/02/2014 Goal Status: Active Interventions: Assess patient pain level pre-, during and post procedure and prior to discharge Treatment Activities: Apply topical anesthetic as ordered : 11/08/2014 Notes: Orientation to the Wound Care Program Nursing Diagnoses: Knowledge deficit related to the wound healing center program Philip [name], Philip [name] ([id number]) Goals: Patient/caregiver will verbalize understanding of the [place] [facility] Date Initiated: 05/02/2014 Goal Status: Active Interventions: Provide education on orientation to the wound center Notes: Electronic Signature(s) Signed: 11/08/2014 5:03:22 PM By: Gretta Cool, RN, BSN, Kim RN, BSN Entered By: Gretta Cool, RN, BSN, Kim on 11/08/2014 09:59:30 Philip [name] ([id number]) -------------------------------------------------------------------------------- Pain Assessment Details Patient Name: Philip [name], Philip [name]. Date of Service: 11/08/2014 9:30 AM Medical Record Number: [id number] Patient Account Number: [id number] Date of Birth/Sex: 1918/11/11 (81 y.o. Male) Treating RN: Baruch Gouty, RN, BSN, Velva Harman Primary Care Physician: Hortencia Pilar Other  Clinician: Referring Physician: Hortencia Pilar Treating Physician/Extender: Frann Rider in Treatment: 27 Active Problems Location of Pain Severity and Description of Pain Patient Has Paino No Site Locations Pain Management and  Medication Current Pain Management: Electronic Signature(s) Signed: 11/08/2014 2:16:09 PM By: Regan Lemming BSN, RN Entered By: Regan Lemming on 11/08/2014 09:45:48 Philip [name] ([id number]) -------------------------------------------------------------------------------- Patient/Caregiver Education Details Patient Name: Philip [name], Philip [name]. Date of Service: 11/08/2014 9:30 AM Medical Record Number: [id number] Patient Account Number: [id number] Date of Birth/Gender: 29-Nov-1918 (81 y.o. Male) Treating RN: Baruch Gouty, RN, BSN, Velva Harman Primary Care Physician: Hortencia Pilar Other Clinician: Referring Physician: Hortencia Pilar Treating Physician/Extender: Frann Rider in Treatment: 63 Education Assessment Education Provided To: Patient and Caregiver dtr Education Topics Provided Welcome To [facility]: Methods: Explain/Verbal Responses: State content correctly Electronic Signature(s) Signed: 11/08/2014 2:16:09 PM By: Regan Lemming BSN, RN Entered By: Regan Lemming on 11/08/2014 10:24:06 Philip [name] ([id number]) -------------------------------------------------------------------------------- Wound Assessment Details Patient Name: Philip [name], Philip [name]. Date of Service: 11/08/2014 9:30 AM Medical Record Number: [id number] Patient Account Number: [id number] Date of Birth/Sex: 01-Sep-1918 (81 y.o. Male) Treating RN: Baruch Gouty, RN, BSN, [place] Primary Care Physician: Hortencia Pilar Other Clinician: Referring Physician: Hortencia Pilar Treating Physician/Extender: Frann Rider in Treatment: 27 Wound Status Wound Number: 3 Primary Malignant Wound Etiology: Wound Location: Right Foot - Dorsal Wound Status: Open Wounding Event: Surgical Injury Comorbid Cataracts, Arrhythmia, Congestive Date Acquired: 07/01/2014 History: Heart Failure Weeks Of Treatment: 17 Clustered Wound: No Photos Photo Uploaded By: Regan Lemming on 11/08/2014 14:31:55 Wound Measurements Length: (cm) 1 Width: (cm)  1 Depth: (cm) 0.2 Area: (cm) 0.785 Volume: (cm) 0.157 % Reduction in Area: 58.4% % Reduction in Volume: 79.2% Epithelialization: None Tunneling: No Undermining: No Wound Description Full Thickness Without Exposed Classification: Support Structures Wound Margin: Distinct, outline attached Exudate Medium Amount: Exudate Type: Serosanguineous Exudate Color: red, brown Foul Odor After Cleansing: No Wound Bed Granulation Amount: Small (1-33%) Exposed Structure Granulation Quality: Pink Fascia Exposed: No Necrotic Amount: Medium (34-66%) Fat Layer Exposed: No ALVON, NYGAARD ([id number]) Necrotic Quality: Adherent Slough Tendon Exposed: No Muscle Exposed: No Joint Exposed: No Bone Exposed: No Limited to Skin Breakdown Periwound Skin Texture Texture Color No Abnormalities Noted: No No Abnormalities Noted: No Callus: No Atrophie Blanche: No Crepitus: No Cyanosis: No Excoriation: No Ecchymosis: No Fluctuance: No Erythema: No Friable: No Hemosiderin Staining: No Induration: No Mottled: No Localized Edema: Yes Pallor: No Rash: No Rubor: No Scarring: No Temperature / Pain Moisture Temperature: No Abnormality No Abnormalities Noted: No Dry / Scaly: No Maceration: No Moist: Yes Wound Preparation Ulcer Cleansing: Rinsed/Irrigated with Saline Topical Anesthetic Applied: Other: lidocaine 4%, Treatment Notes Wound #3 (Right, Dorsal Foot) 1. Cleansed with: Clean wound with Normal Saline 4. Dressing Applied: Mepitel 5. Secondary Dressing Applied Bordered Foam Dressing Dry Gauze 7. Secured with Tubigrip Electronic Signature(s) Signed: 11/08/2014 2:16:09 PM By: Regan Lemming BSN, RN Entered By: Regan Lemming on 11/08/2014 09:53:45 Philip [name] ([id number]) -------------------------------------------------------------------------------- Wound Assessment Details Patient Name: Philip [name], Philip [name]. Date of Service: 11/08/2014 9:30 AM Medical Record Number:  [id number] Patient Account Number: [id number] Date of Birth/Sex: 1918-06-13 (81 y.o. Male) Treating RN: Baruch Gouty, RN, BSN, Velva Harman Primary Care Physician: Hortencia Pilar Other Clinician: Referring Physician: Hortencia Pilar Treating Physician/Extender: Frann Rider in Treatment: 27 Wound Status Wound Number: 4 Primary Malignant Wound Etiology: Wound Location: Left Malleolus - Medial Wound Status: Open Wounding Event: Other Lesion Comorbid Cataracts, Arrhythmia, Congestive Date Acquired: 09/20/2014 History: Heart Failure Weeks Of Treatment: 7  Clustered Wound: No Photos Photo Uploaded By: Regan Lemming on 11/08/2014 14:31:55 Wound Measurements Length: (cm) 1.7 Width: (cm) 2 Depth: (cm) 0.5 Area: (cm) 2.67 Volume: (cm) 1.335 % Reduction in Area: -1600.6% % Reduction in Volume: -8243.7% Epithelialization: None Tunneling: No Undermining: No Wound Description Classification: Partial Thickness Wound Margin: Distinct, outline attached Exudate Amount: Small Exudate Type: Serosanguineous Exudate Color: red, brown Foul Odor After Cleansing: No Wound Bed Granulation Amount: Small (1-33%) Exposed Structure Granulation Quality: Pink Fascia Exposed: No Necrotic Amount: Medium (34-66%) Fat Layer Exposed: No Necrotic Quality: Adherent Slough Tendon Exposed: No Philip [name], Philip [name] ([id number]) Muscle Exposed: No Joint Exposed: No Bone Exposed: No Limited to Skin Breakdown Periwound Skin Texture Texture Color No Abnormalities Noted: No No Abnormalities Noted: No Callus: No Atrophie Blanche: No Crepitus: No Cyanosis: No Excoriation: No Ecchymosis: No Fluctuance: No Erythema: No Friable: No Hemosiderin Staining: No Induration: No Mottled: No Localized Edema: Yes Pallor: No Rash: No Rubor: No Scarring: No Temperature / Pain Moisture Temperature: No Abnormality No Abnormalities Noted: No Tenderness on Palpation: Yes Dry / Scaly: No Maceration: No Moist: Yes Wound  Preparation Ulcer Cleansing: Rinsed/Irrigated with Saline Topical Anesthetic Applied: Other: lidocaine 4%, Treatment Notes Wound #4 (Left, Medial Malleolus) 1. Cleansed with: Clean wound with Normal Saline 4. Dressing Applied: Hydrafera Blue 5. Secondary Dressing Applied Bordered Foam Dressing Dry Gauze 7. Secured with Tubigrip Electronic Signature(s) Signed: 11/08/2014 2:16:09 PM By: Regan Lemming BSN, RN Entered By: Regan Lemming on 11/08/2014 09:54:04 Philip [name] ([id number]) -------------------------------------------------------------------------------- Wound Assessment Details Patient Name: Philip [name], Philip [name]. Date of Service: 11/08/2014 9:30 AM Medical Record Number: [id number] Patient Account Number: [id number] Date of Birth/Sex: 1918/07/11 (81 y.o. Male) Treating RN: Cornell Barman Primary Care Physician: Hortencia Pilar Other Clinician: Referring Physician: Hortencia Pilar Treating Physician/Extender: Frann Rider in Treatment: 27 Wound Status Wound Number: 5 Primary Skin Tear Etiology: Wound Location: Right, Lateral Forearm Wound Status: Open Wounding Event: Shear/Friction Comorbid Cataracts, Arrhythmia, Congestive Date Acquired: 10/01/2014 History: Heart Failure Weeks Of Treatment: 5 Clustered Wound: No Photos Photo Uploaded By: Regan Lemming on 11/08/2014 14:32:08 Wound Measurements Length: (cm) 0.5 Width: (cm) 0.3 Depth: (cm) 0.1 Area: (cm) 0.118 Volume: (cm) 0.012 % Reduction in Area: 98.1% % Reduction in Volume: 98.1% Epithelialization: Large (67-100%) Tunneling: No Undermining: No Wound Description Classification: Partial Thickness Wound Margin: Distinct, outline attached Exudate Amount: Small Exudate Type: Serous Exudate Color: amber Foul Odor After Cleansing: No Wound Bed Granulation Amount: Large (67-100%) Exposed Structure Granulation Quality: Red Fascia Exposed: No Necrotic Amount: None Present (0%) Fat Layer Exposed: No Tendon  Exposed: No Philip [name], KOBER ([id number]) Muscle Exposed: No Joint Exposed: No Bone Exposed: No Limited to Skin Breakdown Periwound Skin Texture Texture Color No Abnormalities Noted: No No Abnormalities Noted: No Callus: No Atrophie Blanche: No Crepitus: No Cyanosis: No Excoriation: No Ecchymosis: No Fluctuance: No Erythema: No Friable: No Hemosiderin Staining: No Induration: No Mottled: No Localized Edema: No Pallor: No Rash: No Rubor: No Scarring: No Temperature / Pain Moisture Temperature: No Abnormality No Abnormalities Noted: No Dry / Scaly: No Maceration: No Moist: Yes Wound Preparation Ulcer Cleansing: Rinsed/Irrigated with Saline Topical Anesthetic Applied: Other: lidocaine 4%, Treatment Notes Wound #5 (Right, Lateral Forearm) 1. Cleansed with: Clean wound with Normal Saline 4. Dressing Applied: Mepitel 5. Secondary Dressing Applied Bordered Foam Dressing Dry Gauze Notes mepitel covered with bordered foam dressing Electronic Signature(s) Signed: 11/08/2014 5:03:22 PM By: Gretta Cool, RN, BSN, Kim RN, BSN Entered By: Gretta Cool, RN, BSN, Kim on 11/08/2014 10:04:26 Arlan Organ  F. ([id number]) -------------------------------------------------------------------------------- Vitals Details Patient Name: DMARION, PERFECT. Date of Service: 11/08/2014 9:30 AM Medical Record Number: [id number] Patient Account Number: [id number] Date of Birth/Sex: Sep 16, 1918 (81 y.o. Male) Treating RN: Baruch Gouty, RN, BSN, [place] Primary Care Physician: Hortencia Pilar Other Clinician: Referring Physician: Hortencia Pilar Treating Physician/Extender: Frann Rider in Treatment: 27 Vital Signs Time Taken: 09:45 Temperature (F): 97.8 Height (in): 69 Pulse (bpm): 65 Weight (lbs): 179 Respiratory Rate (breaths/min): 16 Body Mass Index (BMI): 26.4 Blood Pressure (mmHg): 126/47 Reference Range: 80 - 120 mg / dl Electronic Signature(s) Signed: 11/08/2014 2:16:09 PM By: Regan Lemming BSN,  RN Entered By: Regan Lemming on 11/08/2014 09:46:14

## 2014-11-09 NOTE — Progress Notes (Signed)
NEVAAN, BUNTON ([id number]) Visit Report for 11/08/2014 Chief Complaint Document Details Patient Name: Philip [name], Philip [name]. Date of Service: 11/08/2014 9:30 AM Medical Record Number: [id number] Patient Account Number: [id number] Date of Birth/Sex: 1918/07/02 (81 y.o. Male) Treating RN: Primary Care Physician: Hortencia Pilar Other Clinician: Referring Physician: Hortencia Pilar Treating Physician/Extender: Frann Rider in Treatment: 89 Information Obtained from: Patient Chief Complaint R foot ulcer. L forearm ulcer. 07/19/2014 -- about 2 weeks ago he had a surgical procedure done by dermatologist in [place] and has an open surgical wound on the dorsum of the right foot. Electronic Signature(s) Signed: 11/08/2014 10:15:57 AM By: Christin Fudge MD, FACS Entered By: Christin Fudge on 11/08/2014 10:15:57 Philip [name] ([id number]) -------------------------------------------------------------------------------- HPI Details Patient Name: Philip [name], Philip [name]. Date of Service: 11/08/2014 9:30 AM Medical Record Number: [id number] Patient Account Number: [id number] Date of Birth/Sex: Jan 05, 1919 (81 y.o. Male) Treating RN: Primary Care Physician: Hortencia Pilar Other Clinician: Referring Physician: Hortencia Pilar Treating Physician/Extender: Frann Rider in Treatment: 27 History of Present Illness Location: right leg Duration: Dec 2015 Modifying Factors: history of an injury to the right leg with resulting hematoma and thrombophlebitis and later an ulcer of posterior leg Associated Signs and Symptoms: marked lymphedema of the right leg. He is already on Eloquis. HPI Description: 06/14/14 -- He returns for followup today. He denies any fevers. no fresh issues and his daughter says he's been doing fine. 06/21/14 -- after he sustained a fall this week earlier he applied a bandage over this himself and did not seek any medical attention. he did however manage to control the bleeding and had a  dressing in place the next morning when his son to the visit. In this dressing was removed there was further damaged skin. His right leg has been doing fine otherwise. 07/12/14 --Very pleasant 81 year old with past medical history significant for congestive heart failure (EF 15%), peripheral vascular disease, and chronic kidney disease. He was hospitalized at [facility] in December 2015 for congestive heart failure. He says that he fell during his hospital course and developed a hematoma over his right calf. He was also diagnosed with a right lower extremity DVT for which he takes Eliquis. The hematoma subsequently turned into an ulceration around Christmas, which has healed. He subsequently developed an ulcer on his right dorsal foot and a traumatic left forearm ulcer. Per his report, he underwent biopsy of the right dorsal foot ulceration which demonstrated a skin cancer. His PCP and dermatologist office are both closed today. I reviewed his records in [place] but find no report of biopsy or pathology. He s without complaints today. No significant pain. No fever or chills. Minimal drainage. 07/19/2014 - the patient and his son tell me that about 2 weeks ago the dermatologist did a skin biopsy and this was a large area on the dorsum of his right foot which was left open and no dressing instructions were recommended. Since then he has been called and told that it is a cancer and the need to do a further procedure but that will not happen until about 2 weeks from now. In the meanwhile the patient has not been taking care of his right foot. The left forearm where he had an abrasion and laceration is doing very well. 07/26/2014 -- Reports from 07/08/2014 from the dermatology group reviewed. A excision was done of a lesion located on the dorsum of the right foot and this was 1.7 cm in diameter which was a shave biopsy performed. The wound  was left open after appropriate cauterization and the patient was given  this dressing instructions. 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 08/02/2014 -- all the original problems he came with have completely resolved. He now has a surgical wound on his right foot dorsum where a skin cancer was excised. He goes to see his dermatologist this Philip [name], Philip [name] ([id number]) coming Tuesday and will have definite news next Friday. 08/09/2014 he had gone to his dermatologist on Tuesday and she has injected the base of his ulcer with some chemotherapeutic agent. He was supposed to bring some papers with him but forgot to get them and will bring them in next week. Other than that the dermatologist had suggested using Mehdi honey on the wound. 08/16/2014 -- the patient has brought in his notes from the dermatologist and on 08/06/2014 he received a injection of 5 FU, 500 mg grams into the lesion. the pathology report was also sent and it was a squamous cell carcinoma well-differentiated and edges were involved. They wanted him to use many honey for the wound dressing changes to be done 3 times a week. 08/30/2014 -- he has finished his second injection of 5-FU and has the next one in 2 weeks' time. He is doing fine otherwise. 09/13/2014 - No new complaints. No significant pain. No fever or chills. Minimal drainage. Still receiving 5-FU injections. 09/20/2014 -- He was seen by the dermatologist on 09/10/2014 and Dr. Phillip Heal injected his foot both the right on the dorsum and left near the medial malleolus with 5-FU. The next dose of 5-FU is to be given after 3 months. The patient says he now has a spot on the left medial malleolus where he was injected with 5-FU. 09/27/2014 -- the area on the left ankle where he was injected with 5-FU is now a full-blown ulcer. He also has mild pain in both ankle areas. 10/04/2014 - large had a bit of a fall and injured his right arm last evening and  has had a laceration with no evidence of any foreign body in the right forearm. Electronic Signature(s) Signed: 11/08/2014 10:16:09 AM By: Christin Fudge MD, FACS Entered By: Christin Fudge on 11/08/2014 10:16:09 Philip [name] ([id number]) -------------------------------------------------------------------------------- Otelia Sergeant TISS Details Patient Name: Philip [name], Philip [name]. Date of Service: 11/08/2014 9:30 AM Medical Record Number: [id number] Patient Account Number: [id number] Date of Birth/Sex: 1918-06-11 (81 y.o. Male) Treating RN: Primary Care Physician: Hortencia Pilar Other Clinician: Referring Physician: Hortencia Pilar Treating Physician/Extender: Frann Rider in Treatment: 27 Procedure Performed for: Wound #3 Right,Dorsal Foot Performed By: Physician Pat Patrick., MD Notes the right foot has minimal hypergranulation tissue and this will be cauterized with silver nitrate. Electronic Signature(s) Signed: 11/08/2014 10:15:50 AM By: Christin Fudge MD, FACS Entered By: Christin Fudge on 11/08/2014 10:15:50 Philip [name] ([id number]) -------------------------------------------------------------------------------- Physical Exam Details Patient Name: Philip [name], Philip [name]. Date of Service: 11/08/2014 9:30 AM Medical Record Number: [id number] Patient Account Number: [id number] Date of Birth/Sex: 09-12-1918 (81 y.o. Male) Treating RN: Primary Care Physician: Hortencia Pilar Other Clinician: Referring Physician: Hortencia Pilar Treating Physician/Extender: Frann Rider in Treatment: 27 Constitutional . Pulse regular. Respirations normal and unlabored. Afebrile. . Eyes Nonicteric. Reactive to light. Ears, Nose, Mouth, and Throat Lips, teeth, and gums WNL.Marland Kitchen Moist mucosa without lesions . Neck supple and nontender. No palpable supraclavicular or cervical adenopathy. Normal sized without goiter. Respiratory WNL. No retractions.. Cardiovascular Pedal Pulses WNL. No  clubbing, cyanosis or edema. Chest Breasts symmetical and no nipple discharge.. Breast tissue WNL, no masses, lumps, or tenderness.. Lymphatic No adneopathy. No adenopathy. No adenopathy. Musculoskeletal Adexa without tenderness or enlargement.. Digits and nails w/o clubbing, cyanosis, infection, petechiae, ischemia, or inflammatory conditions.. Integumentary (Hair, Skin) No suspicious lesions. No crepitus or fluctuance. No peri-wound warmth or erythema. No masses.Marland Kitchen Psychiatric Judgement and insight Intact.. No evidence of depression, anxiety, or agitation.. Notes The right forearm is completely healed and the right foot is looking very good with minimal hyper granulation tissue. The left foot still has significant amount of slough. Electronic Signature(s) Signed: 11/08/2014 10:16:56 AM By: Christin Fudge MD, FACS Entered By: Christin Fudge on 11/08/2014 10:16:56 Philip [name] ([id number]) -------------------------------------------------------------------------------- Physician Orders Details Patient Name: Philip [name], Philip [name]. Date of Service: 11/08/2014 9:30 AM Medical Record Number: [id number] Patient Account Number: [id number] Date of Birth/Sex: 09-12-1918 (81 y.o. Male) Treating RN: Cornell Barman Primary Care Physician: Hortencia Pilar Other Clinician: Referring Physician: Hortencia Pilar Treating Physician/Extender: Frann Rider in Treatment: 30 Verbal / Phone Orders: Yes Clinician: Cornell Barman Read Back and Verified: Yes Diagnosis Coding Wound Cleansing Wound #3 Right,Dorsal Foot o Cleanse wound with mild soap and water o May shower with protection. Wound #4 Left,Medial Malleolus o Cleanse wound with mild soap and water o May shower with protection. Wound #5 Right,Lateral Forearm o Cleanse wound with mild soap and water o May shower with protection. Anesthetic Wound #3 Right,Dorsal Foot o Topical Lidocaine 4% cream applied to wound bed prior to  debridement Wound #4 Left,Medial Malleolus o Topical Lidocaine 4% cream applied to wound bed prior to debridement Wound #5 Right,Lateral Forearm o Topical Lidocaine 4% cream applied to wound bed prior to debridement Skin Barriers/Peri-Wound Care Wound #3 Right,Dorsal Foot o Skin Prep Wound #4 Left,Medial Malleolus o Skin Prep Wound #5 Right,Lateral Forearm o Skin Prep Primary Wound Dressing Wound #3 Right,Dorsal Foot o Prisma Ag Philip [name], Philip [name]. ([id number]) Wound #4 Left,Medial Malleolus o Hydrafera Blue - Cut hydrofera blue to fit wound. Wound #5 Right,Lateral Forearm o Other: - mepitel Secondary Dressing Wound #3 Right,Dorsal Foot o Boardered Foam Dressing Wound #4 Left,Medial Malleolus o Boardered Foam Dressing Wound #5 Right,Lateral Forearm o Boardered Foam Dressing Dressing Change Frequency Wound #4 Left,Medial Malleolus o Change dressing every week o Change dressing every other day. Wound #5 Right,Lateral Forearm o Change dressing every week Follow-up Appointments Wound #3 Right,Dorsal Foot o Return Appointment in 1 week. Wound #4 Left,Medial Malleolus o Return Appointment in 1 week. Wound #5 Right,Lateral Forearm o Return Appointment in 1 week. Home Health Wound #3 [place] Visits - [place] Nurse may visit PRN to address patientos wound care needs. o FACE TO FACE ENCOUNTER: MEDICARE and MEDICAID PATIENTS: I certify that this patient is under my care and that I had a face-to-face encounter that meets the physician face-to-face encounter requirements with this patient on this date. The encounter with the patient was in whole or in part for the following MEDICAL CONDITION: (primary reason for Philip [name]) MEDICAL NECESSITY: I certify, that based on my findings, NURSING services are a medically necessary home health service. HOME BOUND STATUS: I certify that my  clinical findings support that this patient is homebound (i.e., Due to illness or injury, pt requires aid of supportive devices such as crutches, cane, wheelchairs, walkers, the use of special transportation or the assistance of another person to leave their place of residence. There is a [place],  Philip [name] ([id number]) normal inability to leave the home and doing so requires considerable and taxing effort. Other absences are for medical reasons / religious services and are infrequent or of short duration when for other reasons). o If current dressing causes regression in wound condition, may D/C ordered dressing product/s and apply Normal Saline Moist Dressing daily until next [place] / Other MD appointment. [place] of regression in wound condition at [phone]. o Please direct any NON-WOUND related issues/requests for orders to patient's Primary Care Physician Wound #4 [place] Visits - [place] Nurse may visit PRN to address patientos wound care needs. o FACE TO FACE ENCOUNTER: MEDICARE and MEDICAID PATIENTS: I certify that this patient is under my care and that I had a face-to-face encounter that meets the physician face-to-face encounter requirements with this patient on this date. The encounter with the patient was in whole or in part for the following MEDICAL CONDITION: (primary reason for [place]) MEDICAL NECESSITY: I certify, that based on my findings, NURSING services are a medically necessary home health service. HOME BOUND STATUS: I certify that my clinical findings support that this patient is homebound (i.e., Due to illness or injury, pt requires aid of supportive devices such as crutches, cane, wheelchairs, walkers, the use of special transportation or the assistance of another person to leave their place of residence. There is a normal inability to leave the home  and doing so requires considerable and taxing effort. Other absences are for medical reasons / religious services and are infrequent or of short duration when for other reasons). o If current dressing causes regression in wound condition, may D/C ordered dressing product/s and apply Normal Saline Moist Dressing daily until next [place] / Other MD appointment. [place] of regression in wound condition at [phone]. o Please direct any NON-WOUND related issues/requests for orders to patient's Primary Care Physician Wound #5 Right,Lateral Forearm o [place] Visits - [place] Nurse may visit PRN to address patientos wound care needs. o FACE TO FACE ENCOUNTER: MEDICARE and MEDICAID PATIENTS: I certify that this patient is under my care and that I had a face-to-face encounter that meets the physician face-to-face encounter requirements with this patient on this date. The encounter with the patient was in whole or in part for the following MEDICAL CONDITION: (primary reason for [place]) MEDICAL NECESSITY: I certify, that based on my findings, NURSING services are a medically necessary home health service. HOME BOUND STATUS: I certify that my clinical findings support that this patient is homebound (i.e., Due to illness or injury, pt requires aid of supportive devices such as crutches, cane, wheelchairs, walkers, the use of special transportation or the assistance of another person to leave their place of residence. There is a normal inability to leave the home and doing so requires considerable and taxing effort. Other absences are for medical reasons / religious services and are infrequent or of short duration when for other reasons). o If current dressing causes regression in wound condition, may D/C ordered dressing product/s and apply Normal Saline Moist Dressing daily until next [place] /  Other MD appointment. [place] of regression in wound condition at [phone]. o Please direct any NON-WOUND related issues/requests for orders to patient's Primary Care Physician Philip [name], Philip [name] ([id number]) Electronic Signature(s) Signed: 11/08/2014 12:46:51 PM By: Christin Fudge MD, FACS Signed:  11/08/2014 5:03:22 PM By: Gretta Cool, RN, BSN, Kim RN, BSN Entered By: Gretta Cool, RN, BSN, Kim on 11/08/2014 10:08:53 Philip [name] ([id number]) -------------------------------------------------------------------------------- Problem List Details Patient Name: ANJEL, PARDO. Date of Service: 11/08/2014 9:30 AM Medical Record Number: [id number] Patient Account Number: [id number] Date of Birth/Sex: 10-May-1918 (81 y.o. 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/20/2014 Yes layer exposed S41.111A Laceration without foreign body of right upper arm, initial 10/04/2014 Yes encounter Inactive Problems Resolved Problems ICD-10 Code Description Active Date Resolved Date L97.212 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 Date of Service: 11/08/2014 9:30 AM Medical Record Number: [id number] Patient Account Number: [id number] Date of Birth/Sex: 12/30/1918 (81 y.o. Male) Treating RN: Primary Care Physician: Hortencia Pilar Other Clinician: Referring Physician: Hortencia Pilar Treating Physician/Extender: Frann Rider in Treatment: 13 Subjective Chief Complaint Information obtained from Patient R foot ulcer. L forearm ulcer. 07/19/2014 -- about 2 weeks ago he had a surgical procedure done by dermatologist in [place] and has an open surgical wound on the dorsum of the right foot. History of Present Illness (HPI) The following HPI elements were documented for the patient's wound: Location: right leg Duration: Dec 2015 Modifying Factors: history of an injury to the right leg with resulting hematoma and thrombophlebitis and later an ulcer of posterior leg Associated Signs and Symptoms: marked lymphedema of the right leg. He is already on Eloquis. 06/14/14 -- He returns for followup today. He denies any fevers. no fresh issues and his daughter says he's been doing fine. 06/21/14 -- after he sustained a fall this week earlier he applied a bandage over this himself and did not seek any medical attention. he did however manage to control the bleeding and had a dressing in place the next morning when his son to the visit. In this dressing was removed there was further damaged skin. His right leg has been doing fine otherwise. 07/12/14 --Very pleasant 81 year old with past medical history significant for congestive heart failure (EF 15%), peripheral vascular disease, and chronic kidney disease. He was hospitalized at [facility] in December 2015 for congestive heart failure. He says that he fell during his hospital course  and developed a hematoma over his right calf. He was also diagnosed with a right lower extremity DVT for which he takes Eliquis. The hematoma subsequently turned into an ulceration around Christmas, which has healed. He subsequently developed an ulcer on his right dorsal foot and a traumatic left forearm ulcer. Per his report, he underwent biopsy of the right dorsal foot ulceration which demonstrated a skin cancer. His PCP and dermatologist office are both closed today. I reviewed his records in [place] but find no report of biopsy or pathology. He s without complaints today.  No significant pain. No fever or chills. Minimal drainage. 07/19/2014 - the patient and his son tell me that about 2 weeks ago the dermatologist did a skin biopsy and this was a large area on the dorsum of his right foot which was left open and no dressing instructions were recommended. Since then he has been called and told that it is a cancer and the need to do a further procedure but that will not happen until about 2 weeks from now. In the meanwhile the patient has not been taking care of his right foot. The left forearm where he had an abrasion and laceration is doing very well. Philip [name], Philip [name] ([id number]) 07/26/2014 -- Reports from 07/08/2014 from the dermatology group reviewed. A excision was done of a lesion located on the dorsum of the right foot and this was 1.7 cm in diameter which was a shave biopsy performed. The wound was left open after appropriate cauterization and the patient was given this dressing instructions. 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 08/02/2014 -- all the original problems he came with have completely resolved. He now has a surgical wound on his right foot dorsum where a skin cancer was excised. He goes to see his dermatologist this coming Tuesday and will have definite news next  Friday. 08/09/2014 he had gone to his dermatologist on Tuesday and she has injected the base of his ulcer with some chemotherapeutic agent. He was supposed to bring some papers with him but forgot to get them and will bring them in next week. Other than that the dermatologist had suggested using Mehdi honey on the wound. 08/16/2014 -- the patient has brought in his notes from the dermatologist and on 08/06/2014 he received a injection of 5 FU, 500 mg grams into the lesion. the pathology report was also sent and it was a squamous cell carcinoma well-differentiated and edges were involved. They wanted him to use many honey for the wound dressing changes to be done 3 times a week. 08/30/2014 -- he has finished his second injection of 5-FU and has the next one in 2 weeks' time. He is doing fine otherwise. 09/13/2014 - No new complaints. No significant pain. No fever or chills. Minimal drainage. Still receiving 5-FU injections. 09/20/2014 -- He was seen by the dermatologist on 09/10/2014 and Dr. Phillip Heal injected his foot both the right on the dorsum and left near the medial malleolus with 5-FU. The next dose of 5-FU is to be given after 3 months. The patient says he now has a spot on the left medial malleolus where he was injected with 5-FU. 09/27/2014 -- the area on the left ankle where he was injected with 5-FU is now a full-blown ulcer. He also has mild pain in both ankle areas. 10/04/2014 - large had a bit of a fall and injured his right arm last evening and has had a laceration with no evidence of any foreign body in the right forearm. Objective Constitutional Pulse regular. Respirations normal and unlabored. Afebrile. Vitals Time Taken: 9:45 AM, Height: 69 in, Weight: 179 lbs, BMI: 26.4, Temperature: 97.8 F, Pulse: 65 bpm, Respiratory Rate: 16 breaths/min, Blood Pressure: 126/47 mmHg. Philip [name], Philip [name] ([id number]) Eyes Nonicteric. Reactive to light. Ears, Nose, Mouth, and Throat Lips,  teeth, and gums WNL.Marland Kitchen Moist mucosa without lesions . Neck supple and nontender. No palpable supraclavicular or cervical adenopathy. Normal sized without goiter. Respiratory WNL. No retractions.. Cardiovascular  Pedal Pulses WNL. No clubbing, cyanosis or edema. Chest Breasts symmetical and no nipple discharge.. Breast tissue WNL, no masses, lumps, or tenderness.. Lymphatic No adneopathy. No adenopathy. No adenopathy. Musculoskeletal Adexa without tenderness or enlargement.. Digits and nails w/o clubbing, cyanosis, infection, petechiae, ischemia, or inflammatory conditions.Marland Kitchen Psychiatric Judgement and insight Intact.. No evidence of depression, anxiety, or agitation.. General Notes: The right forearm is completely healed and the right foot is looking very good with minimal hyper granulation tissue. The left foot still has significant amount of slough. Integumentary (Hair, Skin) No suspicious lesions. No crepitus or fluctuance. No peri-wound warmth or erythema. No masses.. Wound #3 status is Open. Original cause of wound was Surgical Injury. The wound is located on the Right,Dorsal Foot. The wound measures 1cm length x 1cm width x 0.2cm depth; 0.785cm^2 area and 0.157cm^3 volume. The wound is limited to skin breakdown. There is no tunneling or undermining noted. There is a medium amount of serosanguineous drainage noted. The wound margin is distinct with the outline attached to the wound base. There is small (1-33%) pink granulation within the wound bed. There is a medium (34-66%) amount of necrotic tissue within the wound bed including Adherent Slough. The periwound skin appearance exhibited: Localized Edema, Moist. The periwound skin appearance did not exhibit: Callus, Crepitus, Excoriation, Fluctuance, Friable, Induration, Rash, Scarring, Dry/Scaly, Maceration, Atrophie Blanche, Cyanosis, Ecchymosis, Hemosiderin Staining, Mottled, Pallor, Rubor, Erythema. Periwound temperature was noted as  No Abnormality. Wound #4 status is Open. Original cause of wound was Other Lesion. The wound is located on the Left,Medial Malleolus. The wound measures 1.7cm length x 2cm width x 0.5cm depth; 2.67cm^2 area and 1.335cm^3 volume. The wound is limited to skin breakdown. There is no tunneling or undermining noted. There is a small amount of serosanguineous drainage noted. The wound margin is distinct with the outline attached to the wound base. There is small (1-33%) pink granulation within the wound bed. There is a Philip [name], Philip [name]. ([id number]) medium (34-66%) amount of necrotic tissue within the wound bed including Adherent Slough. The periwound skin appearance exhibited: Localized Edema, Moist. The periwound skin appearance did not exhibit: Callus, Crepitus, Excoriation, Fluctuance, Friable, Induration, Rash, Scarring, Dry/Scaly, Maceration, Atrophie Blanche, Cyanosis, Ecchymosis, Hemosiderin Staining, Mottled, Pallor, Rubor, Erythema. Periwound temperature was noted as No Abnormality. The periwound has tenderness on palpation. Wound #5 status is Open. Original cause of wound was Shear/Friction. The wound is located on the Right,Lateral Forearm. The wound measures 0.5cm length x 0.3cm width x 0.1cm depth; 0.118cm^2 area and 0.012cm^3 volume. The wound is limited to skin breakdown. There is no tunneling or undermining noted. There is a small amount of serous drainage noted. The wound margin is distinct with the outline attached to the wound base. There is large (67-100%) red granulation within the wound bed. There is no necrotic tissue within the wound bed. The periwound skin appearance exhibited: Moist. The periwound skin appearance did not exhibit: Callus, Crepitus, Excoriation, Fluctuance, Friable, Induration, Localized Edema, Rash, Scarring, Dry/Scaly, Maceration, Atrophie Blanche, Cyanosis, Ecchymosis, Hemosiderin Staining, Mottled, Pallor, Rubor, Erythema. Periwound temperature was noted as  No Abnormality. 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 on the right foot we will put a piece of Mepitel and then a pressure dressing with some gauze and a bordered foam. These 2 dressings will not be changed for a week. On the left foot we will use Hydrofera Blue. The daughter who is at the bedside will go for his next dermatology appointment and try and see if we can avoid any further injections of 5-FU into his foot. 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 An CHEM CAUT GRANULATION TISS procedure was performed by Keyshawn Hellwig, Jackson Latino., MD. Notes: the right foot has minimal hypergranulation tissue and this will be cauterized with silver nitrate. Plan Wound Cleansing: Wound #3 Right,Dorsal Foot: Cleanse wound with mild soap and water May shower with protection. Wound #4 Left,Medial Malleolus: Cleanse wound with mild soap and water May shower with protection. Wound #5 Right,Lateral Forearm: Cleanse wound with mild soap and water May shower with protection. Anesthetic: Wound #3 Right,Dorsal Foot: Topical Lidocaine 4% cream applied to wound bed prior to debridement Wound #4 Left,Medial Malleolus: Topical Lidocaine 4% cream applied to wound bed prior to debridement Wound #5 Right,Lateral Forearm: Topical Lidocaine 4% cream applied to  wound bed prior to debridement Skin Barriers/Peri-Wound Care: Wound #3 Right,Dorsal Foot: Skin Prep Wound #4 Left,Medial Malleolus: Skin Prep Wound #5 Right,Lateral Forearm: Skin Prep Primary Wound Dressing: Wound #3 Right,Dorsal Foot: Prisma Ag Wound #4 Left,Medial Malleolus: Hydrafera Blue - Cut hydrofera blue to fit wound. Wound #5 Right,Lateral Forearm: Other: - mepitel Secondary Dressing: Wound #3 Right,Dorsal Foot: Boardered Foam Dressing Wound #4 Left,Medial Malleolus: Boardered Foam Dressing Wound #5 Right,Lateral Forearm: Boardered Foam Dressing SKYLOR, SCHNAPP ([id number]) Dressing Change Frequency: Wound #4 Left,Medial Malleolus: Change dressing every week Change dressing every other day. Wound #5 Right,Lateral Forearm: Change dressing every week Follow-up Appointments: Wound #3 Right,Dorsal Foot: Return Appointment in 1 week. Wound #4 Left,Medial Malleolus: Return Appointment in 1 week. Wound #5 Right,Lateral Forearm: Return Appointment in 1 week. Home Health: Wound #3 Right,Dorsal Foot: [place] Visits - [place] Nurse may visit PRN to address patient s wound care needs. FACE TO FACE ENCOUNTER: MEDICARE and MEDICAID PATIENTS: I certify that this patient is under my care and that I had a face-to-face encounter that meets the physician face-to-face encounter requirements with this patient on this date. The encounter with the patient was in whole or in part for the following MEDICAL CONDITION: (primary reason for [place]) MEDICAL NECESSITY: I certify, that based on my findings, NURSING services are a medically necessary home health service. HOME BOUND STATUS: I certify that my clinical findings support that this patient is homebound (i.e., Due to illness or injury, pt requires aid of supportive devices such as crutches, cane, wheelchairs, walkers, the use of special transportation or the assistance of another person  to leave their place of residence. There is a normal inability to leave the home and doing so requires considerable and taxing effort. Other absences are for medical reasons / religious services and are infrequent or of short duration when for other reasons). If current dressing causes regression in wound condition, may D/C ordered dressing product/s and apply Normal Saline Moist Dressing daily until next [place] / Other MD appointment. [place] of regression in wound  condition at [phone]. Please direct any NON-WOUND related issues/requests for orders to patient's Primary Care Physician Wound #4 Left,Medial Malleolus: [place] Vis[facility] - [place] Nurse may visit PRN to address patient s wound care needs. FACE TO FACE ENCOUNTER: MEDICARE and MEDICAID PATIENTS: I certify that this patient is under my care and that I had a face-to-face encounter that meets the physician face-to-face encounter requirements with this patient on this date. The encounter with the patient was in whole or in part for the following MEDICAL CONDITION: (primary reason for [place]) MEDICAL NECESSITY: I certify, that based on my findings, NURSING services are a medically necessary home health service. HOME BOUND STATUS: I certify that my clinical findings support that this patient is homebound (i.e., Due to illness or injury, pt requires aid of supportive devices such as crutches, cane, wheelchairs, walkers, the use of special transportation or the assistance of another person to leave their place of residence. There is a normal inability to leave the home and doing so requires considerable and taxing effort. Other absences are for medical reasons / religious services and are infrequent or of short duration when for other reasons). If current dressing causes regression in wound condition, may D/C ordered dressing product/s and apply Normal Saline  Moist Dressing daily until next [place] / Other MD appointment. [place] of regression in wound condition at [phone]. Please direct any NON-WOUND related issues/requests for orders to patient's Primary Care Physician Wound #5 Right,Lateral Forearm: [place] Nurse may visit PRN to address patient s wound care needs. CHIBUEZE, BEASLEY ([id number]) FACE TO FACE ENCOUNTER: MEDICARE and MEDICAID PATIENTS: I certify that this patient is under my care and that I had a face-to-face encounter that meets the physician face-to-face encounter requirements with this patient on this date. The encounter with the patient was in whole or in part for the following MEDICAL CONDITION: (primary reason for [place]) MEDICAL NECESSITY: I certify, that based on my findings, NURSING services are a medically necessary home health service. HOME BOUND STATUS: I certify that my clinical findings support that this patient is homebound (i.e., Due to illness or injury, pt requires aid of supportive devices such as crutches, cane, wheelchairs, walkers, the use of special transportation or the assistance of another person to leave their place of residence. There is a normal inability to leave the home and doing so requires considerable and taxing effort. Other absences are for medical reasons / religious services and are infrequent or of short duration when for other reasons). If current dressing causes regression in wound condition, may D/C ordered dressing product/s and apply Normal Saline Moist Dressing daily until next [place] / Other MD appointment. [place] of regression in wound condition at [phone]. Please direct any NON-WOUND related issues/requests for orders to patient's Primary Care Physician On the right forearm I have recommended a nonadherent dressing and a form border so as to  protect the freshly epithelialized skin. on the right foot we will put a piece of Mepitel and then a pressure dressing with some gauze and a bordered foam. These 2 dressings will not be changed for a week. On the left foot we will use Hydrofera Blue. The daughter who is at the bedside will go for his next dermatology appointment and try and see if we can avoid any further injections of 5-FU into his foot. Electronic Signature(s)  Signed: 11/08/2014 10:18:55 AM By: Christin Fudge MD, FACS Entered By: Christin Fudge on 11/08/2014 10:18:55 Philip [name] ([id number]) -------------------------------------------------------------------------------- SuperBill Details Patient Name: EATHEN, BUDREAU. Date of Service: 11/08/2014 Medical Record Number: [id number] Patient Account Number: [id number] Date of Birth/Sex: [birth year] (81 y.o. 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 I82.401 Acute embolism and thrombosis of unspecified deep veins of right lower extremity S91.301A Unspecified open wound, right foot, initial encounter Z92.21 Personal history of antineoplastic chemotherapy L97.522 Non-pressure chronic ulcer of other part of left foot with fat layer exposed S41.111A Laceration without foreign body of right upper arm, initial encounter Facility Procedures The patient participates with Medicare or their insurance follows the Medicare Facility Guidelines: CPT4 Code Description Modifier Quantity [id number] - CHEM CAUT GRANULATION TISS 1 ICD-10 Description Diagnosis I70.232 Atherosclerosis of native  arteries of right leg with ulceration of calf S91.301A Unspecified open wound, right foot, initial encounter Physician Procedures CPT4  Code Description: [id number] 99213 - WC PHYS LEVEL 3 - EST PT ICD-10 Description Diagnosis I70.232 Atherosclerosis of native arteries of right leg with ulcera L97.522 Non-pressure chronic ulcer of other part of left foot with S41.111A Laceration  without foreign body of right upper arm, initial Modifier: 25 tion of cal fat layer e encounter Quantity: 1 f xposed CPT4 Code Description: [id number] - WC PHYS CHEM CAUT GRAN TISSUE ICD-10 Description Diagnosis I70.232 Atherosclerosis of native arteries of right leg with ulcera S91.301A Unspecified open wound, right foot, initial encounter ALANDIS, BLUEMEL  ([id number]) Modifier: tion of cal Quantity: 1 f Electronic Signature(s) Signed: 11/08/2014 10:19:31 AM By: Christin Fudge MD, FACS Entered By: Christin Fudge on 11/08/2014 10:19:30

## 2014-11-14 ENCOUNTER — Encounter (HOSPITAL_BASED_OUTPATIENT_CLINIC_OR_DEPARTMENT_OTHER): Payer: Medicare Other | Admitting: General Surgery

## 2014-11-14 DIAGNOSIS — L97521 Non-pressure chronic ulcer of other part of left foot limited to breakdown of skin: Secondary | ICD-10-CM | POA: Diagnosis not present

## 2014-11-14 DIAGNOSIS — L97512 Non-pressure chronic ulcer of other part of right foot with fat layer exposed: Secondary | ICD-10-CM | POA: Diagnosis not present

## 2014-11-14 NOTE — Progress Notes (Signed)
See i heal 

## 2014-11-15 NOTE — Progress Notes (Signed)
Philip, [name] ([id number]) Visit Report for 11/14/2014 Arrival Information Details Patient Name: Philip [name], Philip [name]. Date of Service: 11/14/2014 10:00 AM Medical Record Number: [id number] Patient Account Number: [id number] Date of Birth/Sex: Aug 01, 1918 (81 y.o. Male) Treating RN: Philip [name] Primary Care Physician: Philip [name] Other Clinician: Referring Physician: Hortencia [name] Treating Physician/Extender: Philip [name] in Treatment: 28 Visit Information History Since Last Visit Added or deleted any medications: No Patient Arrived: Ambulatory Any new allergies or adverse reactions: No Arrival Time: 10:12 Had a fall or experienced change in No Accompanied By: son activities of daily living that may affect Transfer Assistance: None risk of falls: Patient Identification Verified: Yes Signs or symptoms of abuse/neglect since last No Secondary Verification Process Yes visito Completed: Hospitalized since last visit: No Patient Requires Transmission- No Has Dressing in Place as Prescribed: Yes Based Precautions: Pain Present Now: No Patient Has Alerts: Yes Patient Alerts: Patient on Blood Thinner No ABIs dt LE blood clots Electronic Signature(s) Signed: 11/15/2014 8:42:46 AM By: Philip Cool, RN, BSN, Kim RN, BSN Entered By: Philip Cool, RN, BSN, Philip [name] on 11/14/2014 11:07:15 Philip [name] ([id number]) -------------------------------------------------------------------------------- Clinic Level of Care Assessment Details Patient Name: Philip [name], Philip [name]. Date of Service: 11/14/2014 10:00 AM Medical Record Number: [id number] Patient Account Number: [id number] Date of Birth/Sex: 1919-01-20 (81 y.o. Male) Treating RN: Philip [name] Primary Care Physician: Philip [name] Other Clinician: Referring Physician: Hortencia [name] Treating Physician/Extender: Philip [name] in Treatment: 28 Clinic Level of Care Assessment Items TOOL 4 Quantity Score []  - Use when only an EandM is performed on  FOLLOW-UP visit 0 ASSESSMENTS - Nursing Assessment / Reassessment []  - Reassessment of Co-morbidities (includes updates in patient status) 0 X - Reassessment of Adherence to Treatment Plan 1 5 ASSESSMENTS - Wound and Skin Assessment / Reassessment X - Simple Wound Assessment / Reassessment - one wound 1 5 []  - Complex Wound Assessment / Reassessment - multiple wounds 0 []  - Dermatologic / Skin Assessment (not related to wound area) 0 ASSESSMENTS - Focused Assessment []  - Circumferential Edema Measurements - multi extremities 0 []  - Nutritional Assessment / Counseling / Intervention 0 []  - Lower Extremity Assessment (monofilament, tuning fork, pulses) 0 []  - Peripheral Arterial Disease Assessment (using hand held doppler) 0 ASSESSMENTS - Ostomy and/or Continence Assessment and Care []  - Incontinence Assessment and Management 0 []  - Ostomy Care Assessment and Management (repouching, etc.) 0 PROCESS - Coordination of Care X - Simple Patient / Family Education for ongoing care 1 15 []  - Complex (extensive) Patient / Family Education for ongoing care 0 []  - Staff obtains Programmer, systems, Records, Test Results / Process Orders 0 []  - Staff telephones HHA, Nursing Homes / Clarify orders / etc 0 []  - Routine Transfer to another Facility (non-emergent condition) 0 Philip, [name] ([id number]) []  - Routine Hospital Admission (non-emergent condition) 0 []  - New Admissions / Biomedical engineer / Ordering NPWT, Apligraf, etc. 0 []  - Emergency Hospital Admission (emergent condition) 0 X - Simple Discharge Coordination 1 10 []  - Complex (extensive) Discharge Coordination 0 PROCESS - Special Needs []  - Pediatric / Minor Patient Management 0 []  - Isolation Patient Management 0 []  - Hearing / Language / Visual special needs 0 []  - Assessment of Community assistance (transportation, D/C planning, etc.) 0 []  - Additional assistance / Altered mentation 0 []  - Support Surface(s) Assessment (bed, cushion,  seat, etc.) 0 INTERVENTIONS - Wound Cleansing / Measurement []  - Simple Wound Cleansing - one wound 0 X - Complex Wound Cleansing - multiple  wounds 3 5 X - Wound Imaging (photographs - any number of wounds) 1 5 []  - Wound Tracing (instead of photographs) 0 []  - Simple Wound Measurement - one wound 0 X - Complex Wound Measurement - multiple wounds 3 5 INTERVENTIONS - Wound Dressings X - Small Wound Dressing one or multiple wounds 1 10 X - Medium Wound Dressing one or multiple wounds 2 15 []  - Large Wound Dressing one or multiple wounds 0 []  - Application of Medications - topical 0 []  - Application of Medications - injection 0 INTERVENTIONS - Miscellaneous []  - External ear exam 0 Philip, [name] ([id number]) []  - Specimen Collection (cultures, biopsies, blood, body fluids, etc.) 0 []  - Specimen(s) / Culture(s) sent or taken to Lab for analysis 0 []  - Patient Transfer (multiple staff / Philip [name] Lift / Similar devices) 0 []  - Simple Staple / Suture removal (25 or less) 0 []  - Complex Staple / Suture removal (26 or more) 0 []  - Hypo / Hyperglycemic Management (close monitor of Blood Glucose) 0 []  - Ankle / Brachial Index (ABI) - do not check if billed separately 0 X - Vital Signs 1 5 Has the patient been seen at the hospital within the last three years: Yes Total Score: 115 Level Of Care: New/Established - Level 3 Electronic Signature(s) Signed: 11/15/2014 8:42:46 AM By: Philip Cool, RN, BSN, Kim RN, BSN Entered By: Philip Cool, RN, BSN, Philip [name] on 11/14/2014 11:09:57 Philip [name] ([id number]) -------------------------------------------------------------------------------- Encounter Discharge Information Details Patient Name: Philip [name], Philip [name]. Date of Service: 11/14/2014 10:00 AM Medical Record Number: [id number] Patient Account Number: [id number] Date of Birth/Sex: 1918/09/25 (81 y.o. Male) Treating RN: Philip [name] Primary Care Physician: Philip [name] Other Clinician: Referring Physician: Hortencia [name] Treating Physician/Extender: Philip [name] in Treatment: 75 Encounter Discharge Information Items Discharge Pain Level: 0 Discharge Condition: Stable Ambulatory Status: Cane Discharge Destination: Home Private Transportation: Auto Accompanied By: son Schedule Follow-up Appointment: Yes Medication Reconciliation completed and Yes provided to Patient/Care Chrles Selley: Clinical Summary of Care: Electronic Signature(s) Signed: 11/15/2014 8:42:46 AM By: Philip Cool, RN, BSN, Kim RN, BSN Entered By: Philip Cool, RN, BSN, Philip [name] on 11/14/2014 11:12:24 Philip [name] ([id number]) -------------------------------------------------------------------------------- Lower Extremity Assessment Details Patient Name: Philip [name], Philip [name]. Date of Service: 11/14/2014 10:00 AM Medical Record Number: [id number] Patient Account Number: [id number] Date of Birth/Sex: [birth year] (81 y.o. Male) Treating RN: Philip [name] Primary Care Physician: Philip [name] Other Clinician: Referring Physician: Hortencia [name] Treating Physician/Extender: Philip [name] in Treatment: 28 Vascular Assessment Pulses: Posterior Tibial Dorsalis Pedis Palpable: [Left:Yes] [Right:Yes] Extremity colors, hair growth, and conditions: Extremity Color: [Left:Hyperpigmented] [Right:Hyperpigmented] Hair Growth on Extremity: [Left:No] [Right:No] Temperature of Extremity: [Left:Warm] [Right:Warm] Capillary Refill: [Left:< 3 seconds] [Right:< 3 seconds] Toe Nail Assessment Left: Right: Thick: Yes Yes Discolored: Yes Yes Deformed: Yes Yes Improper Length and Hygiene: No No Electronic Signature(s) Signed: 11/15/2014 8:42:46 AM By: Philip Cool, RN, BSN, Kim RN, BSN Entered By: Philip Cool, RN, BSN, Philip [name] on 11/14/2014 11:07:39 Philip [name] ([id number]) -------------------------------------------------------------------------------- Multi Wound Chart Details Patient Name: Philip [name], Philip [name]. Date of Service: 11/14/2014 10:00 AM Medical Record  Number: [id number] Patient Account Number: [id number] Date of Birth/Sex: [birth year] (81 y.o. 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] [4:Malignant Wound] [5:Skin Tear] Comorbid History: [3:Cataracts, Arrhythmia, Congestive Heart Failure] [4:Cataracts, Arrhythmia, Congestive Heart Failure] [5:Cataracts, Arrhythmia, Congestive Heart Failure] Date Acquired: [3:07/01/2014] [4:09/20/2014] [5:10/01/2014] Weeks of Treatment: [3:17] [4:7] [5:5] Wound Status: [3:Open] [4:Open] [5:Open] Measurements L x W x D 0.9x0.8x0.2 [4:1.7x1.6x0.3] [5:0.3x0.1x0.1] (cm) Area (cm) : [3:0.565] [4:2.136] [5:0.024] Volume (cm) : [3:0.113] [4:0.641] [5:0.002] % Reduction in Area: [3:70.00%] [4:-1260.50%] [5:99.60%] % Reduction in Volume: 85.00% [4:-3906.20%] [5:99.70%] Classification: [3:Full Thickness Without Exposed Support Structures] [4:Partial Thickness] [5:Partial Thickness] Exudate Amount: [3:Medium] [4:Small] [5:Small] Exudate Type: [3:Serosanguineous] [4:Serosanguineous] [5:Serous] Exudate Color: [3:red, brown] [4:red, brown] [5:amber] Wound Margin: [3:Distinct, outline attached] [4:Distinct, outline attached] [5:Distinct, outline attached] Granulation Amount: [3:Small (1-33%)] [4:Small (1-33%)] [5:Large (67-100%)] Granulation Quality: [3:Pink] [4:Pink] [5:Red] Necrotic Amount: [3:Medium (34-66%)] [4:Medium (34-66%)] [5:None Present  (0%)] Exposed Structures: [3:Fascia: No Fat: No Tendon: No Muscle: No Joint: No] [4:Fascia: No Fat: No Tendon: No Muscle: No Joint: No] [5:Fascia: No Fat: No Tendon: No Muscle: No Joint: No] Bone: No Bone: No Bone: No Limited to Skin Limited to Skin Limited to Skin Breakdown Breakdown Breakdown Epithelialization: None None Large (67-100%) Periwound Skin Texture: Edema: Yes Edema: Yes Edema: No Excoriation: No Excoriation: No Excoriation: No Induration: No Induration: No Induration: No Callus: No Callus: No Callus: No Crepitus: No Crepitus: No Crepitus: No Fluctuance: No Fluctuance: No Fluctuance: No Friable: No Friable: No Friable: No Rash: No Rash: No Rash: No Scarring: No Scarring: No Scarring: No Periwound Skin Moist: Yes Moist: Yes Moist: Yes Moisture: Maceration: No Maceration: No Maceration: No Dry/Scaly: No Dry/Scaly: No Dry/Scaly: No Periwound Skin Color: Atrophie Blanche: No Atrophie Blanche: No Atrophie Blanche: No Cyanosis: No Cyanosis: No Cyanosis: No Ecchymosis: No Ecchymosis: No Ecchymosis: No Erythema: No Erythema: No Erythema: No Hemosiderin Staining: No Hemosiderin Staining: No Hemosiderin Staining: No Mottled: No Mottled: No Mottled: No Pallor: No Pallor: No Pallor: No Rubor: No Rubor: No Rubor: No Temperature: No Abnormality No Abnormality No Abnormality Tenderness on No Yes No Palpation: Wound Preparation: Ulcer Cleansing: Ulcer Cleansing: Ulcer Cleansing: Rinsed/Irrigated with Rinsed/Irrigated with Rinsed/Irrigated with Saline Saline Saline Topical Anesthetic Topical Anesthetic Topical Anesthetic Applied: Other: lidocaine Applied: Other: lidocaine Applied: Other: lidocaine 4% 4% 4% Treatment Notes Electronic Signature(s) Signed: 11/15/2014 8:42:46 AM By: Philip Cool, RN, BSN, Kim RN, BSN Entered By: Philip Cool, RN, BSN, Philip [name] on 11/14/2014 11:08:18 Philip [name]  ([id number]) -------------------------------------------------------------------------------- [place] Details Patient Name: Philip [name], Philip [name]. Date of Service: 11/14/2014 10:00 AM Medical Record Number: [id number] Patient Account Number: [id number] Date of Birth/Sex: 1919-01-13 (81 y.o. Male) Treating RN: Philip [name] Primary Care Physician: Philip [name] Other Clinician: Referring Physician: Hortencia [name] Treating Physician/Extender: Philip [name] in Treatment: 78 Active Inactive Abuse / Safety / Falls / Self Care Management Nursing Diagnoses: Abuse or neglect; actual or potential Potential for falls Goals: Patient will remain injury free Date Initiated: 05/02/2014 Goal Status: Active Interventions: Assess fall risk on admission and as needed Notes: Necrotic Tissue Nursing Diagnoses: Impaired tissue integrity related to necrotic/devitalized tissue Goals: Necrotic/devitalized tissue will be minimized in the wound bed Date Initiated: 05/02/2014 Goal Status: Active Interventions: Assess patient pain level pre-, during and post procedure and prior to discharge Treatment Activities: Apply topical anesthetic as ordered : 11/14/2014 Notes: Orientation to the Wound Care Program Nursing Diagnoses: Knowledge deficit related to the wound healing center program DREDYN, GUBBELS ([id number]) Goals: Patient/caregiver will verbalize understanding of the [facility] Program Date  Initiated: 05/02/2014 Goal Status: Active Interventions: Provide education on orientation to the wound center Notes: Electronic Signature(s) Signed: 11/15/2014 8:42:46 AM By: Philip Cool, RN, BSN, Kim RN, BSN Entered By: Philip Cool, RN, BSN, Philip [name] on 11/14/2014 11:08:02 Philip [name] ([id number]) -------------------------------------------------------------------------------- Pain Assessment Details Patient Name: Philip [name], Philip [name]. Date of Service: 11/14/2014 10:00 AM Medical Record Number:  [id number] Patient Account Number: [id number] Date of Birth/Sex: 19-May-1918 (81 y.o. Male) Treating RN: Philip [name] Primary Care Physician: Philip [name] Other Clinician: Referring Physician: Hortencia [name] Treating Physician/Extender: Philip [name] in Treatment: 28 Active Problems Location of Pain Severity and Description of Pain Patient Has Paino No Site Locations Pain Management and Medication Current Pain Management: Electronic Signature(s) Signed: 11/15/2014 8:42:46 AM By: Philip Cool, RN, BSN, Kim RN, BSN Entered By: Philip Cool, RN, BSN, Philip [name] on 11/14/2014 11:07:23 Philip [name] ([id number]) -------------------------------------------------------------------------------- Patient/Caregiver Education Details Patient Name: OZAN, MACLAY. Date of Service: 11/14/2014 10:00 AM Medical Record Number: [id number] Patient Account Number: [id number] Date of Birth/Gender: October 10, 1918 (81 y.o. Male) Treating RN: Philip [name] Primary Care Physician: Philip [name] Other Clinician: Referring Physician: Hortencia [name] Treating Physician/Extender: Philip [name] in Treatment: 57 Education Assessment Education Provided To: Patient Education Topics Provided Wound/Skin Impairment: Handouts: Caring for Your Ulcer, Other: HHRN and wound care as prescribed Electronic Signature(s) Signed: 11/15/2014 8:42:46 AM By: Philip Cool, RN, BSN, Kim RN, BSN Entered By: Philip Cool, RN, BSN, Philip [name] on 11/14/2014 11:12:46 Philip [name] ([id number]) -------------------------------------------------------------------------------- Wound Assessment Details Patient Name: Philip [name], Philip [name]. Date of Service: 11/14/2014 10:00 AM Medical Record Number: [id number] Patient Account Number: [id number] Date of Birth/Sex: [birth year] (81 y.o. Male) Treating RN: Philip [name] Primary Care Physician: Philip [name] Other Clinician: Referring Physician: Hortencia [name] Treating Physician/Extender: Frann Rider in Treatment: 28 Wound  Status Wound Number: 3 Primary Malignant Wound Etiology: Wound Location: Right Foot - Dorsal Wound Status: Open Wounding Event: Surgical Injury Comorbid Cataracts, Arrhythmia, Congestive Date Acquired: 07/01/2014 History: Heart Failure Weeks Of Treatment: 17 Clustered Wound: No Photos Photo Uploaded By: Philip Cool, RN, BSN, Philip [name] on 11/14/2014 11:30:51 Wound Measurements Length: (cm) 0.9 Width: (cm) 0.8 Depth: (cm) 0.2 Area: (cm) 0.565 Volume: (cm) 0.113 % Reduction in Area: 70% % Reduction in Volume: 85% Epithelialization: None Wound Description Full Thickness Without Exposed Classification: Support Structures Wound Margin: Distinct, outline attached Exudate Medium Amount: Exudate Type: Serosanguineous Exudate Color: red, brown Foul Odor After Cleansing: No Wound Bed Granulation Amount: Small (1-33%) Exposed Structure Granulation Quality: Pink Fascia Exposed: No Necrotic Amount: Medium (34-66%) Fat Layer Exposed: No Philip [name], Philip [name] ([id number]) Necrotic Quality: Adherent Slough Tendon Exposed: No Muscle Exposed: No Joint Exposed: No Bone Exposed: No Limited to Skin Breakdown Periwound Skin Texture Texture Color No Abnormalities Noted: No No Abnormalities Noted: No Callus: No Atrophie Blanche: No Crepitus: No Cyanosis: No Excoriation: No Ecchymosis: No Fluctuance: No Erythema: No Friable: No Hemosiderin Staining: No Induration: No Mottled: No Localized Edema: Yes Pallor: No Rash: No Rubor: No Scarring: No Temperature / Pain Moisture Temperature: No Abnormality No Abnormalities Noted: No Dry / Scaly: No Maceration: No Moist: Yes Wound Preparation Ulcer Cleansing: Rinsed/Irrigated with Saline Topical Anesthetic Applied: Other: lidocaine 4%, Treatment Notes Wound #3 (Right, Dorsal Foot) 1. Cleansed with: Clean wound with Normal Saline 2. Anesthetic Topical Lidocaine 4% cream to wound bed prior to debridement 4. Dressing Applied: Prisma  Ag 5. Secondary Dressing Applied Bordered Foam Dressing 7. Secured with Financial risk analyst) Signed: 11/15/2014 8:42:46 AM By: Philip Cool, RN, BSN, Kim RN, BSN Entered By: Philip Cool, RN, BSN, Philip [name] on  11/14/2014 10:26:47 Philip [name], BORNER ([id number]) -------------------------------------------------------------------------------- Wound Assessment Details Patient Name: Philip [name], Philip [name]. Date of Service: 11/14/2014 10:00 AM Medical Record Number: [id number] Patient Account Number: [id number] Date of Birth/Sex: [birth year] (81 y.o. Male) Treating RN: Philip [name] Primary Care Physician: Philip [name] Other Clinician: Referring Physician: Hortencia [name] Treating Physician/Extender: Frann Rider in Treatment: 28 Wound Status Wound Number: 4 Primary Malignant Wound Etiology: Wound Location: Left Malleolus - Medial Wound Status: Open Wounding Event: Other Lesion Comorbid Cataracts, Arrhythmia, Congestive Date Acquired: 09/20/2014 History: Heart Failure Weeks Of Treatment: 7 Clustered Wound: No Photos Photo Uploaded By: Philip Cool, RN, BSN, Philip [name] on 11/14/2014 11:31:56 Wound Measurements Length: (cm) 1.7 Width: (cm) 1.6 Depth: (cm) 0.3 Area: (cm) 2.136 Volume: (cm) 0.641 % Reduction in Area: -1260.5% % Reduction in Volume: -3906.2% Epithelialization: None Wound Description Classification: Partial Thickness Wound Margin: Distinct, outline attached Exudate Amount: Small Exudate Type: Serosanguineous Exudate Color: red, brown Foul Odor After Cleansing: No Wound Bed Granulation Amount: Small (1-33%) Exposed Structure Granulation Quality: Pink Fascia Exposed: No Necrotic Amount: Medium (34-66%) Fat Layer Exposed: No Necrotic Quality: Adherent Slough Tendon Exposed: No Philip [name], Philip [name] ([id number]) Muscle Exposed: No Joint Exposed: No Bone Exposed: No Limited to Skin Breakdown Periwound Skin Texture Texture Color No Abnormalities Noted: No No Abnormalities Noted:  No Callus: No Atrophie Blanche: No Crepitus: No Cyanosis: No Excoriation: No Ecchymosis: No Fluctuance: No Erythema: No Friable: No Hemosiderin Staining: No Induration: No Mottled: No Localized Edema: Yes Pallor: No Rash: No Rubor: No Scarring: No Temperature / Pain Moisture Temperature: No Abnormality No Abnormalities Noted: No Tenderness on Palpation: Yes Dry / Scaly: No Maceration: No Moist: Yes Wound Preparation Ulcer Cleansing: Rinsed/Irrigated with Saline Topical Anesthetic Applied: Other: lidocaine 4%, Treatment Notes Wound #4 (Left, Medial Malleolus) 1. Cleansed with: Clean wound with Normal Saline 2. Anesthetic Topical Lidocaine 4% cream to wound bed prior to debridement 4. Dressing Applied: Prisma Ag 5. Secondary Dressing Applied Bordered Foam Dressing 7. Secured with Financial risk analyst) Signed: 11/15/2014 8:42:46 AM By: Philip Cool, RN, BSN, Kim RN, BSN Entered By: Philip Cool, RN, BSN, Philip [name] on 11/14/2014 10:27:14 Philip [name] ([id number]) -------------------------------------------------------------------------------- Wound Assessment Details Patient Name: Philip [name], Philip [name]. Date of Service: 11/14/2014 10:00 AM Medical Record Number: [id number] Patient Account Number: [id number] Date of Birth/Sex: [birth year] (81 y.o. Male) Treating RN: Philip [name] Primary Care Physician: Philip [name] Other Clinician: Referring Physician: Hortencia [name] Treating Physician/Extender: Frann Rider in Treatment: 28 Wound Status Wound Number: 5 Primary Skin Tear Etiology: Wound Location: Right Forearm - Lateral Wound Status: Open Wounding Event: Shear/Friction Comorbid Cataracts, Arrhythmia, Congestive Date Acquired: 10/01/2014 History: Heart Failure Weeks Of Treatment: 5 Clustered Wound: No Photos Photo Uploaded By: Philip Cool, RN, BSN, Philip [name] on 11/14/2014 11:31:57 Wound Measurements Length: (cm) 0.3 Width: (cm) 0.1 Depth: (cm) 0.1 Area: (cm)  0.024 Volume: (cm) 0.002 % Reduction in Area: 99.6% % Reduction in Volume: 99.7% Epithelialization: Large (67-100%) Wound Description Classification: Partial Thickness Wound Margin: Distinct, outline attached Exudate Amount: Small Exudate Type: Serous Exudate Color: amber Foul Odor After Cleansing: No Wound Bed Granulation Amount: Large (67-100%) Exposed Structure Granulation Quality: Red Fascia Exposed: No Necrotic Amount: None Present (0%) Fat Layer Exposed: No Tendon Exposed: No CORRAN, LALONE ([id number]) Muscle Exposed: No Joint Exposed: No Bone Exposed: No Limited to Skin Breakdown Periwound Skin Texture Texture Color No Abnormalities Noted: No No Abnormalities Noted: No Callus: No Atrophie Blanche: No Crepitus: No Cyanosis: No Excoriation: No Ecchymosis: No Fluctuance: No Erythema: No Friable: No Hemosiderin Staining: No  Induration: No Mottled: No Localized Edema: No Pallor: No Rash: No Rubor: No Scarring: No Temperature / Pain Moisture Temperature: No Abnormality No Abnormalities Noted: No Dry / Scaly: No Maceration: No Moist: Yes Wound Preparation Ulcer Cleansing: Rinsed/Irrigated with Saline Topical Anesthetic Applied: Other: lidocaine 4%, Treatment Notes Wound #5 (Right, Lateral Forearm) 1. Cleansed with: Cleanse wound with antibacterial soap and water Notes mepitel only Electronic Signature(s) Signed: 11/15/2014 8:42:46 AM By: Philip Cool, RN, BSN, Kim RN, BSN Entered By: Philip Cool, RN, BSN, Philip [name] on 11/14/2014 10:27:41 Philip [name] ([id number]) -------------------------------------------------------------------------------- [place] Details Patient Name: BRENDIN, SITU. Date of Service: 11/14/2014 10:00 AM Medical Record Number: [id number] Patient Account Number: [id number] Date of Birth/Sex: Apr 01, 1919 (81 y.o. Male) Treating RN: Philip [name] Primary Care Physician: Philip [name] Other Clinician: Referring Physician: Hortencia [name] Treating  Physician/Extender: Philip [name] in Treatment: 28 Vital Signs Time Taken: 10:13 Temperature (F): 97.9 Height (in): 69 Respiratory Rate (breaths/min): 16 Weight (lbs): 179 Blood Pressure (mmHg): 128/68 Body Mass Index (BMI): 26.4 Reference Range: 80 - 120 mg / dl Electronic Signature(s) Signed: 11/15/2014 8:42:46 AM By: Philip Cool, RN, BSN, Kim RN, BSN Entered By: Philip Cool, RN, BSN, Philip [name] on 11/14/2014 11:07:30

## 2014-11-15 NOTE — Progress Notes (Addendum)
D'ARCY, ABRAHA ([id number]) Visit Report for 11/14/2014 Physician Orders Details Patient Name: Philip [name], Philip [name]. Date of Service: 11/14/2014 10:00 AM Medical Record Number: [id number] Patient Account Number: [id number] Date of Birth/Sex: Apr 06, 1919 (81 y.o. Male) Treating RN: Cornell Barman Primary Care Physician: Hortencia Pilar Other Clinician: Referring Physician: Hortencia Pilar Treating Physician/Extender: Benjaman Pott in Treatment: 51 Verbal / Phone Orders: Yes Clinician: Cornell Barman Read Back and Verified: Yes Diagnosis Coding Wound Cleansing Wound #3 Right,Dorsal Foot o Cleanse wound with mild soap and water o May Shower, gently pat wound dry prior to applying new dressing. Wound #4 Left,Medial Malleolus o Cleanse wound with mild soap and water o May Shower, gently pat wound dry prior to applying new dressing. Wound #5 Right,Lateral Forearm o Cleanse wound with mild soap and water o May Shower, gently pat wound dry prior to applying new dressing. Anesthetic Wound #3 Right,Dorsal Foot o Topical Lidocaine 4% cream applied to wound bed prior to debridement Wound #5 Right,Lateral Forearm o Topical Lidocaine 4% cream applied to wound bed prior to debridement Primary Wound Dressing Wound #3 Right,Dorsal Foot o Prisma Ag Wound #4 Left,Medial Malleolus o Mepitel One Wound #5 Right,Lateral Forearm o Prisma Ag Secondary Dressing Wound #3 Right,Dorsal Foot o Boardered Foam Dressing GRAYSYN, BACHE ([id number]) Wound #5 Right,Lateral Forearm o Boardered Foam Dressing Dressing Change Frequency Wound #3 Right,Dorsal Foot o Change Dressing Monday, Wednesday, Friday Wound #4 Left,Medial Malleolus o Other: - Leave dressing in place. Change if it begins to peel off. Wound #5 Right,Lateral Forearm o Change Dressing Monday, Wednesday, Friday Follow-up Appointments Wound #3 Right,Dorsal Foot o Return Appointment in 1 week. Wound #4 Left,Medial  Malleolus o Return Appointment in 1 week. Wound #5 Right,Lateral Forearm o Return Appointment in 1 week. Edema Control Wound #3 Right,Dorsal Foot o Tubigrip Wound #5 Right,Lateral Forearm o Tubigrip Home Health Wound #3 Door Visits o Home Health Nurse may visit PRN to address patientos wound care needs. o FACE TO FACE ENCOUNTER: MEDICARE and MEDICAID PATIENTS: I certify that this patient is under my care and that I had a face-to-face encounter that meets the physician face-to-face encounter requirements with this patient on this date. The encounter with the patient was in whole or in part for the following MEDICAL CONDITION: (primary reason for [name]) MEDICAL NECESSITY: I certify, that based on my findings, NURSING services are a medically necessary home health service. HOME BOUND STATUS: I certify that my clinical findings support that this patient is homebound (i.e., Due to illness or injury, pt requires aid of supportive devices such as crutches, cane, wheelchairs, walkers, the use of special transportation or the assistance of another person to leave their place of residence. There is a normal inability to leave the home and doing so requires considerable and taxing effort. Other absences are for medical reasons / religious services and are infrequent or of short duration when for other reasons). SAMARI, GORBY ([id number]) o If current dressing causes regression in wound condition, may D/C ordered dressing product/s and apply Normal Saline Moist Dressing daily until next [place] / Other MD appointment. [place] of regression in wound condition at [phone]. o Please direct any NON-WOUND related issues/requests for orders to patient's Primary Care Physician Wound #4 [place] Nurse may visit PRN to address patientos wound  care needs. o FACE TO FACE ENCOUNTER: MEDICARE and MEDICAID PATIENTS: I certify that this patient  is under my care and that I had a face-to-face encounter that meets the physician face-to-face encounter requirements with this patient on this date. The encounter with the patient was in whole or in part for the following MEDICAL CONDITION: (primary reason for [place]) MEDICAL NECESSITY: I certify, that based on my findings, NURSING services are a medically necessary home health service. HOME BOUND STATUS: I certify that my clinical findings support that this patient is homebound (i.e., Due to illness or injury, pt requires aid of supportive devices such as crutches, cane, wheelchairs, walkers, the use of special transportation or the assistance of another person to leave their place of residence. There is a normal inability to leave the home and doing so requires considerable and taxing effort. Other absences are for medical reasons / religious services and are infrequent or of short duration when for other reasons). o If current dressing causes regression in wound condition, may D/C ordered dressing product/s and apply Normal Saline Moist Dressing daily until next [place] / Other MD appointment. [place] of regression in wound condition at [phone]. o Please direct any NON-WOUND related issues/requests for orders to patient's Primary Care Physician Wound #5 Right,Lateral Forearm o [place] Nurse may visit PRN to address patientos wound care needs. o FACE TO FACE ENCOUNTER: MEDICARE and MEDICAID PATIENTS: I certify that this patient is under my care and that I had a face-to-face encounter that meets the physician face-to-face encounter requirements with this patient on this date. The encounter with the patient was in whole or in part for the following MEDICAL CONDITION: (primary reason for [place]) MEDICAL NECESSITY: I certify, that based on my findings, NURSING services are a medically necessary home health service. HOME BOUND STATUS: I certify that my clinical findings support that this patient is homebound (i.e., Due to illness or injury, pt requires aid of supportive devices such as crutches, cane, wheelchairs, walkers, the use of special transportation or the assistance of another person to leave their place of residence. There is a normal inability to leave the home and doing so requires considerable and taxing effort. Other absences are for medical reasons / religious services and are infrequent or of short duration when for other reasons). o If current dressing causes regression in wound condition, may D/C ordered dressing product/s and apply Normal Saline Moist Dressing daily until next [place] / Other MD appointment. [place] of regression in wound condition at [phone]. o Please direct any NON-WOUND related issues/requests for orders to patient's Primary Care Physician Electronic Signature(s) CHAMBERLAIN, STEINBORN ([id number]) Signed: 11/15/2014 8:42:46 AM By: Gretta Cool, RN, BSN, Kim RN, BSN Entered By: Gretta Cool, RN, BSN, Kim on 11/14/2014 11:08:42 [name] ([id number]) -------------------------------------------------------------------------------- Progress Note Details Patient Name: Philip [name], Philip [name]. Date of Service: 11/14/2014 10:00 AM Medical Record Number: [id number] Patient Account Number: [id number] Date of Birth/Sex: 1918-12-08 (81 y.o. Male) Treating RN: Primary Care Physician: Hortencia Pilar Other Clinician: Referring Physician: Hortencia Pilar Treating Physician/Extender: Judene Companion Weeks in Treatment: 28 Subjective Multpile non pressure ulcers treated with debridement and silver alginate Objective Constitutional Vitals Time Taken: 10:13 AM, Height: 69 in, Weight: 179 lbs, BMI: 26.4, Temperature: 97.9 F,  Respiratory Rate: 16 breaths/min, Blood Pressure: 128/68 mmHg. Integumentary (Hair, Skin) Wound #3 status is Open. Original cause of wound was Surgical Injury. The wound is located on the Right,Dorsal Foot. The wound measures 0.9cm length x 0.8cm width x 0.2cm depth;  0.565cm^2 area and 0.113cm^3 volume. The wound is limited to skin breakdown. There is a medium amount of serosanguineous drainage noted. The wound margin is distinct with the outline attached to the wound base. There is small (1-33%) pink granulation within the wound bed. There is a medium (34-66%) amount of necrotic tissue within the wound bed including Adherent Slough. The periwound skin appearance exhibited: Localized Edema, Moist. The periwound skin appearance did not exhibit: Callus, Crepitus, Excoriation, Fluctuance, Friable, Induration, Rash, Scarring, Dry/Scaly, Maceration, Atrophie Blanche, Cyanosis, Ecchymosis, Hemosiderin Staining, Mottled, Pallor, Rubor, Erythema. Periwound temperature was noted as No Abnormality. Wound #4 status is Open. Original cause of wound was Other Lesion. The wound is located on the Left,Medial Malleolus. The wound measures 1.7cm length x 1.6cm width x 0.3cm depth; 2.136cm^2 area and 0.641cm^3 volume. The wound is limited to skin breakdown. There is a small amount of serosanguineous drainage noted. The wound margin is distinct with the outline attached to the wound base. There is small (1-33%) pink granulation within the wound bed. There is a medium (34-66%) amount of necrotic tissue within the wound bed including Adherent Slough. The periwound skin appearance exhibited: Localized Edema, Moist. The periwound skin appearance did not exhibit: Callus, Crepitus, Excoriation, Fluctuance, Friable, Induration, Rash, Scarring, Dry/Scaly, Maceration, Atrophie Blanche, Cyanosis, Ecchymosis, Hemosiderin Staining, Mottled, Pallor, Rubor, Erythema. Periwound temperature was noted as No Abnormality. The  periwound has tenderness on palpation. Wound #5 status is Open. Original cause of wound was Shear/Friction. The wound is located on the Right,Lateral Forearm. The wound measures 0.3cm length x 0.1cm width x 0.1cm depth; 0.024cm^2 area and 0.002cm^3 volume. The wound is limited to skin breakdown. There is a small amount of serous drainage noted. The wound margin is distinct with the outline attached to the wound base. There is large (67-100%) red granulation within the wound bed. There is no necrotic tissue within the wound bed. The SEWARD, CORAN ([id number]) periwound skin appearance exhibited: Moist. The periwound skin appearance did not exhibit: Callus, Crepitus, Excoriation, Fluctuance, Friable, Induration, Localized Edema, Rash, Scarring, Dry/Scaly, Maceration, Atrophie Blanche, Cyanosis, Ecchymosis, Hemosiderin Staining, Mottled, Pallor, Rubor, Erythema. Periwound temperature was noted as No Abnormality. 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 Wound #4 Left,Medial Malleolus: Cleanse wound with mild soap and water May Shower, gently pat wound dry prior to applying new dressing. Wound #5 Right,Lateral Forearm: Cleanse wound with mild soap and water May Shower, gently pat wound dry prior to applying new dressing. Anesthetic: Wound #3 Right,Dorsal Foot: Topical Lidocaine 4% cream applied to  wound bed prior to debridement Wound #5 Right,Lateral Forearm: Topical Lidocaine 4% cream applied to wound bed prior to debridement Primary Wound Dressing: Wound #3 Right,Dorsal Foot: Prisma Ag Wound #4 Left,Medial Malleolus: Mepitel One Wound #5 Right,Lateral Forearm: Prisma Ag Secondary Dressing: Wound #3 Right,Dorsal Foot: LAZARO, ISENHOWER ([id number]) Boardered Foam Dressing Wound #5 Right,Lateral Forearm: Boardered Foam Dressing Dressing Change Frequency: Wound #3 Right,Dorsal Foot: Change Dressing Monday, Wednesday, Friday Wound #4 Left,Medial Malleolus: Other: - Leave dressing  in place. Change if it begins to peel off. Wound #5 Right,Lateral Forearm: Change Dressing Monday, Wednesday, Friday Follow-up Appointments: Wound #3 Right,Dorsal Foot: Return Appointment in 1 week. Wound #4 Left,Medial Malleolus: Return Appointment in 1 week. Wound #5 Right,Lateral Forearm: Return Appointment in 1 week. Edema Control: Wound #3 Right,Dorsal Foot: Tubigrip Wound #5 Right,Lateral Forearm: Tubigrip Home Health: Wound #3 Right,Dorsal Foot: [place] Nurse may visit PRN to address patient s wound care needs. FACE TO FACE ENCOUNTER: MEDICARE and MEDICAID PATIENTS: I certify that this patient is under my care and that I had a face-to-face encounter that meets the physician face-to-face encounter requirements with this patient on this date. The encounter with the patient was in whole or in part for the following MEDICAL CONDITION: (primary reason for [place]) MEDICAL NECESSITY: I certify, that based on my findings, NURSING services are a medically necessary home health service. HOME BOUND STATUS: I certify that my clinical findings support that this patient is homebound (i.e., Due to illness or injury, pt requires aid of supportive devices such as crutches, cane, wheelchairs, walkers, the use of special transportation or the assistance of another  person to leave their place of residence. There is a normal inability to leave the home and doing so requires considerable and taxing effort. Other absences are for medical reasons / religious services and are infrequent or of short duration when for other reasons). If current dressing causes regression in wound condition, may D/C ordered dressing product/s and apply Normal Saline Moist Dressing daily until next [place] / Other MD appointment. [place] of regression in wound condition at [phone]. Please direct any NON-WOUND related issues/requests for orders to patient's Primary Care Physician Wound #4 Left,Medial Malleolus: [place] Nurse may visit PRN to address patient s wound care needs. FACE TO FACE ENCOUNTER: MEDICARE and MEDICAID PATIENTS: I certify that this patient is under my care and that I had a face-to-face encounter that meets the physician face-to-face encounter requirements with this patient on this date. The encounter with the patient was in whole or in part for the following MEDICAL CONDITION: (primary reason for [place]) MEDICAL NECESSITY: I certify, that based on my findings, NURSING services are a medically necessary home health service. HOME BOUND STATUS: I certify that my clinical findings support that this patient is homebound (i.e., Due to illness or injury, pt requires aid of supportive devices such as crutches, cane, wheelchairs, walkers, the use of special transportation or the assistance of another person to leave their place of residence. There is a COURTENAY, CREGER. ([id number]) normal inability to leave the home and doing so requires considerable and taxing effort. Other absences are for medical reasons / religious services and are infrequent or of short duration when for other reasons). If current dressing causes regression in wound condition, may D/C ordered dressing product/s and  apply Normal Saline Moist Dressing daily until next [place] / Other MD appointment. [place] of regression in wound condition at [phone]. Please direct any NON-WOUND related issues/requests for orders to patient's Primary Care Physician Wound #5 Right,Lateral Forearm: [place] Nurse may visit PRN to address patient s wound care needs. FACE TO FACE ENCOUNTER: MEDICARE and MEDICAID PATIENTS: I certify that this patient is under my care and that I had a face-to-face encounter that meets the physician face-to-face encounter requirements with this patient on this date. The  encounter with the patient was in whole or in part for the following MEDICAL CONDITION: (primary reason for [place]) MEDICAL NECESSITY: I certify, that based on my findings, NURSING services are a medically necessary home health service. HOME BOUND STATUS: I certify that my clinical findings support that this patient is homebound (i.e., Due to illness or injury, pt requires aid of supportive devices such as crutches, cane, wheelchairs, walkers, the use of special transportation or the assistance of another person to leave their place of residence. There is a normal inability to leave the home and doing so requires considerable and taxing effort. Other absences are for medical reasons / religious services and are infrequent or of short duration when for other reasons). If current dressing causes regression in wound condition, may D/C ordered dressing product/s and apply Normal Saline Moist Dressing daily until next [place] / Other MD appointment. [place] of regression in wound condition at [phone]. Please direct any NON-WOUND related issues/requests for orders to patient's Primary Care Physician Follow-Up Appointments: A follow-up appointment should be scheduled. Medication Reconciliation completed and provided to  Patient/Care Provider. Electronic Signature(s) Signed: 11/26/2014 4:42:44 PM By: Lorine Bears RCP, RRT, CHT Previous Signature: 11/21/2014 1:05:48 PM Version By: Judene Companion MD Entered By: Lorine Bears on 11/26/2014 16:42:44 [name] ([id number]) -------------------------------------------------------------------------------- Philip [name] Details Patient Name: MATE, Philip [name]. Date of Service: 11/14/2014 Medical Record Number: [id number] Patient Account Number: [id number] Date of Birth/Sex: 1918-05-04 (81 y.o. 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 S41.111A Laceration without foreign body of right upper arm, initial encounter Facility Procedures The patient participates with Medicare or their insurance follows the Medicare Facility Guidelines: CPT4 Code Description Modifier Quantity [id number] [phone] - WOUND CARE VISIT-LEV 3 EST PT 1 Physician Procedures CPT4: Description Modifier Quantity Code [id number] - WC PHYS LEVEL 2 - EST PT 1 ICD-10 Description Diagnosis I70.232 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 I82.401 Acute embolism and thrombosis of unspecified deep  veins of right lower extremity S91.301A Unspecified open wound, right foot, initial encounter Z92.21 Personal history of antineoplastic chemotherapy Electronic Signature(s) Signed: 11/21/2014 1:06:33 PM By: Judene Companion MD Entered By: Judene Companion on 11/21/2014 13:06:32

## 2014-11-22 ENCOUNTER — Encounter: Payer: Medicare Other | Attending: Surgery | Admitting: Surgery

## 2014-11-22 DIAGNOSIS — Z9221 Personal history of antineoplastic chemotherapy: Secondary | ICD-10-CM | POA: Insufficient documentation

## 2014-11-22 DIAGNOSIS — X58XXXD Exposure to other specified factors, subsequent encounter: Secondary | ICD-10-CM | POA: Insufficient documentation

## 2014-11-22 DIAGNOSIS — I82401 Acute embolism and thrombosis of unspecified deep veins of right lower extremity: Secondary | ICD-10-CM | POA: Insufficient documentation

## 2014-11-22 DIAGNOSIS — L97522 Non-pressure chronic ulcer of other part of left foot with fat layer exposed: Secondary | ICD-10-CM | POA: Diagnosis not present

## 2014-11-22 DIAGNOSIS — S41111D Laceration without foreign body of right upper arm, subsequent encounter: Secondary | ICD-10-CM | POA: Insufficient documentation

## 2014-11-22 DIAGNOSIS — I70232 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: Secondary | ICD-10-CM | POA: Diagnosis not present

## 2014-11-22 DIAGNOSIS — S91301D Unspecified open wound, right foot, subsequent encounter: Secondary | ICD-10-CM | POA: Diagnosis not present

## 2014-11-23 NOTE — Progress Notes (Signed)
LEONELL, LOBDELL ([id number]) Visit Report for 11/22/2014 Chief Complaint Document Details Patient Name: Philip [name], Philip [name]. Date of Service: 11/22/2014 2:15 PM Medical Record Number: [id number] Patient Account Number: [id number] Date of Birth/Sex: November 11, 1918 (81 y.o. Male) Treating RN: Primary Care Physician: Hortencia Pilar Other Clinician: Referring Physician: Hortencia Pilar Treating Physician/Extender: Frann Rider in Treatment: 56 Information Obtained from: Patient Chief Complaint R foot ulcer. L forearm ulcer. 07/19/2014 -- about 2 weeks ago he had a surgical procedure done by dermatologist in [place] and has an open surgical wound on the dorsum of the right foot. Electronic Signature(s) Signed: 11/22/2014 3:02:29 PM By: Christin Fudge MD, FACS Entered By: Christin Fudge on 11/22/2014 15:02:29 Philip [name] ([id number]) -------------------------------------------------------------------------------- Debridement Details Patient Name: Philip [name], Philip [name]. Date of Service: 11/22/2014 2:15 PM Medical Record Number: [id number] Patient Account Number: [id number] Date of Birth/Sex: March 10, 1919 (81 y.o. Male) Treating RN: Primary Care Physician: Hortencia Pilar Other Clinician: Referring Physician: Hortencia Pilar Treating Physician/Extender: Frann Rider in Treatment: 29 Debridement Performed for Wound #4 Left,Medial Malleolus Assessment: Performed By: Physician Pat Patrick., MD Debridement: Debridement Pre-procedure Yes Verification/Time Out Taken: Start Time: 14:49 Pain Control: Lidocaine 4% Topical Solution Level: Skin/Subcutaneous Tissue Total Area Debrided (L x 1.5 (cm) x 1.5 (cm) = 2.25 (cm) W): Tissue and other Viable, Non-Viable, Eschar, Fibrin/Slough, Subcutaneous material debrided: Instrument: Curette Bleeding: Minimum Hemostasis Achieved: Pressure End Time: 14:54 Procedural Pain: 0 Post Procedural Pain: 0 Response to Treatment: Procedure was tolerated  well Post Debridement Measurements of Total Wound Length: (cm) 1.5 Width: (cm) 1.5 Depth: (cm) 0.3 Volume: (cm) 0.53 Electronic Signature(s) Signed: 11/22/2014 3:02:23 PM By: Christin Fudge MD, FACS Entered By: Christin Fudge on 11/22/2014 15:02:23 Philip [name] ([id number]) -------------------------------------------------------------------------------- HPI Details Patient Name: Philip [name]. Date of Service: 11/22/2014 2:15 PM Medical Record Number: [id number] Patient Account Number: [id number] Date of Birth/Sex: 03/09/1919 (81 y.o. Male) Treating RN: Primary Care Physician: Hortencia Pilar Other Clinician: Referring Physician: Hortencia Pilar Treating Physician/Extender: Frann Rider in Treatment: 29 History of Present Illness Location: right leg Duration: Dec 2015 Modifying Factors: history of an injury to the right leg with resulting hematoma and thrombophlebitis and later an ulcer of posterior leg Associated Signs and Symptoms: marked lymphedema of the right leg. He is already on Eloquis. HPI Description: 06/14/14 -- He returns for followup today. He denies any fevers. no fresh issues and his daughter says he's been doing fine. 06/21/14 -- after he sustained a fall this week earlier he applied a bandage over this himself and did not seek any medical attention. he did however manage to control the bleeding and had a dressing in place the next morning when his son to the visit. In this dressing was removed there was further damaged skin. His right leg has been doing fine otherwise. 07/12/14 --Very pleasant 81 year old with past medical history significant for congestive heart failure (EF 15%), peripheral vascular disease, and chronic kidney disease. He was hospitalized at [facility] in December 2015 for congestive heart failure. He says that he fell during his hospital course and developed a hematoma over his right calf. He was also diagnosed with a right lower extremity DVT for  which he takes Eliquis. The hematoma subsequently turned into an ulceration around Christmas, which has healed. He subsequently developed an ulcer on his right dorsal foot and a traumatic left forearm ulcer. Per his report, he underwent biopsy of the right dorsal foot ulceration which demonstrated a skin cancer. His PCP and dermatologist office are both  closed today. I reviewed his records in [place] but find no report of biopsy or pathology. He s without complaints today. No significant pain. No fever or chills. Minimal drainage. 07/19/2014 - the patient and his son tell me that about 2 weeks ago the dermatologist did a skin biopsy and this was a large area on the dorsum of his right foot which was left open and no dressing instructions were recommended. Since then he has been called and told that it is a cancer and the need to do a further procedure but that will not happen until about 2 weeks from now. In the meanwhile the patient has not been taking care of his right foot. The left forearm where he had an abrasion and laceration is doing very well. 07/26/2014 -- Reports from 07/08/2014 from the dermatology group reviewed. A excision was done of a lesion located on the dorsum of the right foot and this was 1.7 cm in diameter which was a shave biopsy performed. The wound was left open after appropriate cauterization and the patient was given this dressing instructions. 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 08/02/2014 -- all the original problems he came with have completely resolved. He now has a surgical wound on his right foot dorsum where a skin cancer was excised. He goes to see his dermatologist this Philip [name], Philip [name] ([id number]) coming Tuesday and will have definite news next Friday. 08/09/2014 he had gone to his dermatologist on Tuesday and she has injected the base of his ulcer with some  chemotherapeutic agent. He was supposed to bring some papers with him but forgot to get them and will bring them in next week. Other than that the dermatologist had suggested using Mehdi honey on the wound. 08/16/2014 -- the patient has brought in his notes from the dermatologist and on 08/06/2014 he received a injection of 5 FU, 500 mg grams into the lesion. the pathology report was also sent and it was a squamous cell carcinoma well-differentiated and edges were involved. They wanted him to use many honey for the wound dressing changes to be done 3 times a week. 08/30/2014 -- he has finished his second injection of 5-FU and has the next one in 2 weeks' time. He is doing fine otherwise. 09/13/2014 - No new complaints. No significant pain. No fever or chills. Minimal drainage. Still receiving 5-FU injections. 09/20/2014 -- He was seen by the dermatologist on 09/10/2014 and Dr. Phillip Heal injected his foot both the right on the dorsum and left near the medial malleolus with 5-FU. The next dose of 5-FU is to be given after 3 months. The patient says he now has a spot on the left medial malleolus where he was injected with 5-FU. 09/27/2014 -- the area on the left ankle where he was injected with 5-FU is now a full-blown ulcer. He also has mild pain in both ankle areas. 10/04/2014 - large had a bit of a fall and injured his right arm last evening and has had a laceration with no evidence of any foreign body in the right forearm. Electronic Signature(s) Signed: 11/22/2014 3:02:37 PM By: Christin Fudge MD, FACS Entered By: Christin Fudge on 11/22/2014 15:02:36 Philip [name] ([id number]) -------------------------------------------------------------------------------- Physical Exam Details Patient Name: Philip [name], Philip [name]. Date of Service: 11/22/2014 2:15 PM Medical Record Number: [id number] Patient Account Number: [id number] Date of Birth/Sex: 01-Jan-1919 (81 y.o. Male) Treating RN: Primary Care Physician:  Hoy Morn,  Heidi Other Clinician: Referring Physician: Hortencia Pilar Treating Physician/Extender: Frann Rider in Treatment: 29 Constitutional . Pulse regular. Respirations normal and unlabored. Afebrile. . Eyes Nonicteric. Reactive to light. Ears, Nose, Mouth, and Throat Lips, teeth, and gums WNL.Marland Kitchen Moist mucosa without lesions . Neck supple and nontender. No palpable supraclavicular or cervical adenopathy. Normal sized without goiter. Respiratory WNL. No retractions.. Breath sounds WNL, No rubs, rales, rhonchi, or wheeze.. Cardiovascular Heart rhythm and rate regular, no murmur or gallop.. Pedal Pulses WNL. No clubbing, cyanosis or edema. Chest Breasts symmetical and no nipple discharge.. Breast tissue WNL, no masses, lumps, or tenderness.. Lymphatic No adneopathy. No adenopathy. No adenopathy. Musculoskeletal Adexa without tenderness or enlargement.. Digits and nails w/o clubbing, cyanosis, infection, petechiae, ischemia, or inflammatory conditions.. Integumentary (Hair, Skin) No suspicious lesions. No crepitus or fluctuance. No peri-wound warmth or erythema. No masses.Marland Kitchen Psychiatric Judgement and insight Intact.. No evidence of depression, anxiety, or agitation.. Notes The right forearm is looking good and the right foot is looking excellent. The left foot has significant slough and will need sharp debridement. Electronic Signature(s) Signed: 11/22/2014 3:03:05 PM By: Christin Fudge MD, FACS Entered By: Christin Fudge on 11/22/2014 15:03:05 Philip [name] ([id number]) -------------------------------------------------------------------------------- Physician Orders Details Patient Name: Philip [name], Philip [name]. Date of Service: 11/22/2014 2:15 PM Medical Record Number: [id number] Patient Account Number: [id number] Date of Birth/Sex: 1918-09-02 (81 y.o. Male) Treating RN: Baruch Gouty, RN, BSN, Velva Harman Primary Care Physician: Hortencia Pilar Other Clinician: Referring Physician: Hortencia Pilar Treating Physician/Extender: Frann Rider in Treatment: 1 Verbal / Phone Orders: Yes Clinician: Afful, RN, BSN, Rita Read Back and Verified: Yes Diagnosis Coding Wound Cleansing Wound #3 Right,Dorsal Foot o Clean wound with wound cleanser. o Cleanse wound with mild soap and water o May Shower, gently pat wound dry prior to applying new dressing. o May shower with protection. Wound #4 Left,Medial Malleolus o Clean wound with wound cleanser. o Cleanse wound with mild soap and water o May Shower, gently pat wound dry prior to applying new dressing. o May shower with protection. Wound #5 Right,Lateral Forearm o Clean wound with wound cleanser. o Cleanse wound with mild soap and water o May Shower, gently pat wound dry prior to applying new dressing. o May shower with protection. Skin Barriers/Peri-Wound Care Wound #3 Right,Dorsal Foot o Skin Prep Wound #4 Left,Medial Malleolus o Skin Prep Wound #5 Right,Lateral Forearm o Skin Prep Primary Wound Dressing Wound #3 Right,Dorsal Foot o Prisma Ag Wound #4 Left,Medial Malleolus o Hydrafera Blue Wound #5 Right,Lateral Forearm Philip [name], Philip [name] ([id number]) o Mepitel One Secondary Dressing Wound #3 Right,Dorsal Foot o Boardered Foam Dressing Wound #4 Left,Medial Malleolus o Boardered Foam Dressing Wound #5 Right,Lateral Forearm o Boardered Foam Dressing Dressing Change Frequency Wound #3 Right,Dorsal Foot o Change dressing every other day. Wound #4 Left,Medial Malleolus o Change dressing every other day. Wound #5 Right,Lateral Forearm o Change dressing every week Follow-up Appointments Wound #3 Right,Dorsal Foot o Return Appointment in 1 week. Wound #4 Left,Medial Malleolus o Return Appointment in 1 week. Wound #5 Right,Lateral Forearm o Return Appointment in 1 week. Edema Control Wound #3 Right,Dorsal Foot o Tubigrip Wound #4 Left,Medial  Malleolus o Tubigrip Wound #5 Right,Lateral Forearm o Tubigrip Home Health Wound #3 [place] Visits - [place] Nurse may visit PRN to address patientos wound care needs. Philip [name], Philip [name] ([id number]) o FACE TO FACE ENCOUNTER: MEDICARE and MEDICAID PATIENTS: I certify that this patient is under my care and that I  had a face-to-face encounter that meets the physician face-to-face encounter requirements with this patient on this date. The encounter with the patient was in whole or in part for the following MEDICAL CONDITION: (primary reason for [place]) MEDICAL NECESSITY: I certify, that based on my findings, NURSING services are a medically necessary home health service. HOME BOUND STATUS: I certify that my clinical findings support that this patient is homebound (i.e., Due to illness or injury, pt requires aid of supportive devices such as crutches, cane, wheelchairs, walkers, the use of special transportation or the assistance of another person to leave their place of residence. There is a normal inability to leave the home and doing so requires considerable and taxing effort. Other absences are for medical reasons / religious services and are infrequent or of short duration when for other reasons). o If current dressing causes regression in wound condition, may D/C ordered dressing product/s and apply Normal Saline Moist Dressing daily until next [place] / Other MD appointment. [place] of regression in wound condition at [phone]. o Please direct any NON-WOUND related issues/requests for orders to patient's Primary Care Physician Wound #4 [place] Visits - [place] Nurse may visit PRN to address patientos wound care needs. o FACE TO FACE ENCOUNTER: MEDICARE and MEDICAID PATIENTS: I certify that this patient is under my care and  that I had a face-to-face encounter that meets the physician face-to-face encounter requirements with this patient on this date. The encounter with the patient was in whole or in part for the following MEDICAL CONDITION: (primary reason for [place]) MEDICAL NECESSITY: I certify, that based on my findings, NURSING services are a medically necessary home health service. HOME BOUND STATUS: I certify that my clinical findings support that this patient is homebound (i.e., Due to illness or injury, pt requires aid of supportive devices such as crutches, cane, wheelchairs, walkers, the use of special transportation or the assistance of another person to leave their place of residence. There is a normal inability to leave the home and doing so requires considerable and taxing effort. Other absences are for medical reasons / religious services and are infrequent or of short duration when for other reasons). o If current dressing causes regression in wound condition, may D/C ordered dressing product/s and apply Normal Saline Moist Dressing daily until next [place] / Other MD appointment. [place] of regression in wound condition at [phone]. o Please direct any NON-WOUND related issues/requests for orders to patient's Primary Care Physician Wound #5 Right,Lateral Forearm o [place] Visits - [place] Nurse may visit PRN to address patientos wound care needs. o FACE TO FACE ENCOUNTER: MEDICARE and MEDICAID PATIENTS: I certify that this patient is under my care and that I had a face-to-face encounter that meets the physician face-to-face encounter requirements with this patient on this date. The encounter with the patient was in whole or in part for the following MEDICAL CONDITION: (primary reason for [place]) MEDICAL NECESSITY: I certify, that based on my findings, NURSING services are a medically necessary home  health service. HOME BOUND STATUS: I certify that my clinical findings support that this patient is homebound (i.e., Due to illness or injury, pt requires aid of supportive devices such as crutches, cane, wheelchairs, walkers, the use of special XZAYVIER, FAGIN. ([id number]) transportation or the assistance of another person to leave their place of residence. There is  a normal inability to leave the home and doing so requires considerable and taxing effort. Other absences are for medical reasons / religious services and are infrequent or of short duration when for other reasons). o If current dressing causes regression in wound condition, may D/C ordered dressing product/s and apply Normal Saline Moist Dressing daily until next [place] / Other MD appointment. [place] of regression in wound condition at [phone]. o Please direct any NON-WOUND related issues/requests for orders to patient's Primary Care Physician Electronic Signature(s) Signed: 11/22/2014 3:10:43 PM By: Christin Fudge MD, FACS Signed: 11/22/2014 3:31:27 PM By: Regan Lemming BSN, RN Entered By: Regan Lemming on 11/22/2014 14:55:38 Philip [name] ([id number]) -------------------------------------------------------------------------------- Problem List Details Patient Name: Philip [name], Philip [name]. Date of Service: 11/22/2014 2:15 PM Medical Record Number: [id number] Patient Account Number: [id number] Date of Birth/Sex: 20-Jul-1918 (81 y.o. Male) Treating RN: Primary Care Physician: Hortencia Pilar Other Clinician: Referring Physician: Hortencia Pilar Treating Physician/Extender: Frann Rider in Treatment: 29 Active Problems ICD-10 Encounter Code Description Active Date Diagnosis I70.232 Atherosclerosis of native arteries of right leg with 06/21/2014 Yes ulceration of calf I82.401 Acute embolism and thrombosis of unspecified deep veins 06/21/2014 Yes of right lower extremity S91.301A Unspecified  open wound, right foot, initial encounter 07/19/2014 Yes Z92.21 Personal history of antineoplastic chemotherapy 08/23/2014 Yes L97.522 Non-pressure chronic ulcer of other part of left foot with fat 09/20/2014 Yes layer exposed S41.111A Laceration without foreign body of right upper arm, initial 10/04/2014 Yes encounter Inactive Problems Resolved Problems ICD-10 Code Description Active Date Resolved Date L97.212 Non-pressure chronic ulcer of right calf with fat layer 06/21/2014 06/21/2014 exposed S51.812A Laceration without foreign body of left forearm, initial 06/21/2014 06/21/2014 encounter Philip [name] ([id number]) Electronic Signature(s) Signed: 11/22/2014 3:02:13 PM By: Christin Fudge MD, FACS Entered By: Christin Fudge on 11/22/2014 15:02:13 Philip [name] ([id number]) -------------------------------------------------------------------------------- Progress Note Details Patient Name: Philip [name]. Date of Service: 11/22/2014 2:15 PM Medical Record Number: [id number] Patient Account Number: [id number] Date of Birth/Sex: 11-Feb-1919 (81 y.o. Male) Treating RN: Primary Care Physician: Hortencia Pilar Other Clinician: Referring Physician: Hortencia Pilar Treating Physician/Extender: Frann Rider in Treatment: 31 Subjective Chief Complaint Information obtained from Patient R foot ulcer. L forearm ulcer. 07/19/2014 -- about 2 weeks ago he had a surgical procedure done by dermatologist in [place] and has an open surgical wound on the dorsum of the right foot. History of Present Illness (HPI) The following HPI elements were documented for the patient's wound: Location: right leg Duration: Dec 2015 Modifying Factors: history of an injury to the right leg with resulting hematoma and thrombophlebitis and later an ulcer of posterior leg Associated Signs and Symptoms: marked lymphedema of the right leg. He is already on Eloquis. 06/14/14 -- He returns for followup today. He denies any fevers. no  fresh issues and his daughter says he's been doing fine. 06/21/14 -- after he sustained a fall this week earlier he applied a bandage over this himself and did not seek any medical attention. he did however manage to control the bleeding and had a dressing in place the next morning when his son to the visit. In this dressing was removed there was further damaged skin. His right leg has been doing fine otherwise. 07/12/14 --Very pleasant 81 year old with past medical history significant for congestive heart failure (EF 15%), peripheral vascular disease, and chronic kidney disease. He was hospitalized at [facility] in December 2015 for congestive heart failure. He says that he fell during his  hospital course and developed a hematoma over his right calf. He was also diagnosed with a right lower extremity DVT for which he takes Eliquis. The hematoma subsequently turned into an ulceration around Christmas, which has healed. He subsequently developed an ulcer on his right dorsal foot and a traumatic left forearm ulcer. Per his report, he underwent biopsy of the right dorsal foot ulceration which demonstrated a skin cancer. His PCP and dermatologist office are both closed today. I reviewed his records in [place] but find no report of biopsy or pathology. He s without complaints today. No significant pain. No fever or chills. Minimal drainage. 07/19/2014 - the patient and his son tell me that about 2 weeks ago the dermatologist did a skin biopsy and this was a large area on the dorsum of his right foot which was left open and no dressing instructions were recommended. Since then he has been called and told that it is a cancer and the need to do a further procedure but that will not happen until about 2 weeks from now. In the meanwhile the patient has not been taking care of his right foot. The left forearm where he had an abrasion and laceration is doing very well. Philip [name], Philip [name] ([id number]) 07/26/2014 --  Reports from 07/08/2014 from the dermatology group reviewed. A excision was done of a lesion located on the dorsum of the right foot and this was 1.7 cm in diameter which was a shave biopsy performed. The wound was left open after appropriate cauterization and the patient was given this dressing instructions. 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 08/02/2014 -- all the original problems he came with have completely resolved. He now has a surgical wound on his right foot dorsum where a skin cancer was excised. He goes to see his dermatologist this coming Tuesday and will have definite news next Friday. 08/09/2014 he had gone to his dermatologist on Tuesday and she has injected the base of his ulcer with some chemotherapeutic agent. He was supposed to bring some papers with him but forgot to get them and will bring them in next week. Other than that the dermatologist had suggested using Mehdi honey on the wound. 08/16/2014 -- the patient has brought in his notes from the dermatologist and on 08/06/2014 he received a injection of 5 FU, 500 mg grams into the lesion. the pathology report was also sent and it was a squamous cell carcinoma well-differentiated and edges were involved. They wanted him to use many honey for the wound dressing changes to be done 3 times a week. 08/30/2014 -- he has finished his second injection of 5-FU and has the next one in 2 weeks' time. He is doing fine otherwise. 09/13/2014 - No new complaints. No significant pain. No fever or chills. Minimal drainage. Still receiving 5-FU injections. 09/20/2014 -- He was seen by the dermatologist on 09/10/2014 and Dr. Phillip Heal injected his foot both the right on the dorsum and left near the medial malleolus with 5-FU. The next dose of 5-FU is to be given after 3 months. The patient says he now has a spot on the left medial malleolus where  he was injected with 5-FU. 09/27/2014 -- the area on the left ankle where he was injected with 5-FU is now a full-blown ulcer. He also has mild pain in both ankle areas. 10/04/2014 - large had a bit of a fall and injured his right  arm last evening and has had a laceration with no evidence of any foreign body in the right forearm. Objective Constitutional Pulse regular. Respirations normal and unlabored. Afebrile. Vitals Time Taken: 2:33 PM, Height: 69 in, Weight: 179 lbs, BMI: 26.4, Temperature: 97.9 F, Pulse: 70 bpm, Respiratory Rate: 18 breaths/min, Blood Pressure: 106/66 mmHg. MAXXON, SCHWANKE ([id number]) Eyes Nonicteric. Reactive to light. Ears, Nose, Mouth, and Throat Lips, teeth, and gums WNL.Marland Kitchen Moist mucosa without lesions . Neck supple and nontender. No palpable supraclavicular or cervical adenopathy. Normal sized without goiter. Respiratory WNL. No retractions.. Breath sounds WNL, No rubs, rales, rhonchi, or wheeze.. Cardiovascular Heart rhythm and rate regular, no murmur or gallop.. Pedal Pulses WNL. No clubbing, cyanosis or edema. Chest Breasts symmetical and no nipple discharge.. Breast tissue WNL, no masses, lumps, or tenderness.. Lymphatic No adneopathy. No adenopathy. No adenopathy. Musculoskeletal Adexa without tenderness or enlargement.. Digits and nails w/o clubbing, cyanosis, infection, petechiae, ischemia, or inflammatory conditions.Marland Kitchen Psychiatric Judgement and insight Intact.. No evidence of depression, anxiety, or agitation.. General Notes: The right forearm is looking good and the right foot is looking excellent. The left foot has significant slough and will need sharp debridement. Integumentary (Hair, Skin) No suspicious lesions. No crepitus or fluctuance. No peri-wound warmth or erythema. No masses.. Wound #3 status is Open. Original cause of wound was Surgical Injury. The wound is located on the Right,Dorsal Foot. The wound measures 0.5cm length x 0.5cm  width x 0.1cm depth; 0.196cm^2 area and 0.02cm^3 volume. The wound is limited to skin breakdown. There is no tunneling or undermining noted. There is a small amount of serosanguineous drainage noted. The wound margin is distinct with the outline attached to the wound base. There is small (1-33%) pink granulation within the wound bed. There is a small (1-33%) amount of necrotic tissue within the wound bed including Adherent Slough. The periwound skin appearance exhibited: Localized Edema, Moist. The periwound skin appearance did not exhibit: Callus, Crepitus, Excoriation, Fluctuance, Friable, Induration, Rash, Scarring, Dry/Scaly, Maceration, Atrophie Blanche, Cyanosis, Ecchymosis, Hemosiderin Staining, Mottled, Pallor, Rubor, Erythema. Periwound temperature was noted as No Abnormality. Wound #4 status is Open. Original cause of wound was Other Lesion. The wound is located on the Left,Medial Malleolus. The wound measures 1.5cm length x 1.5cm width x 0.3cm depth; 1.767cm^2 area and 0.53cm^3 volume. The wound is limited to skin breakdown. There is no tunneling or undermining noted. There is a small amount of serosanguineous drainage noted. The wound margin is distinct with the outline attached to the wound base. There is small (1-33%) pink granulation within the wound bed. There is a Philip [name], Philip [name]. ([id number]) medium (34-66%) amount of necrotic tissue within the wound bed including Adherent Slough. The periwound skin appearance exhibited: Localized Edema, Moist. The periwound skin appearance did not exhibit: Callus, Crepitus, Excoriation, Fluctuance, Friable, Induration, Rash, Scarring, Dry/Scaly, Maceration, Atrophie Blanche, Cyanosis, Ecchymosis, Hemosiderin Staining, Mottled, Pallor, Rubor, Erythema. Periwound temperature was noted as No Abnormality. The periwound has tenderness on palpation. Wound #5 status is Open. Original cause of wound was Shear/Friction. The wound is located on  the Right,Lateral Forearm. The wound measures 3cm length x 1.5cm width x 0.1cm depth; 3.534cm^2 area and 0.353cm^3 volume. The wound is limited to skin breakdown. There is no tunneling or undermining noted. There is a small amount of serous drainage noted. The wound margin is distinct with the outline attached to the wound base. There is large (67-100%) red granulation within the wound bed. There is no necrotic tissue within the wound  bed. The periwound skin appearance exhibited: Moist. The periwound skin appearance did not exhibit: Callus, Crepitus, Excoriation, Fluctuance, Friable, Induration, Localized Edema, Rash, Scarring, Dry/Scaly, Maceration, Atrophie Blanche, Cyanosis, Ecchymosis, Hemosiderin Staining, Mottled, Pallor, Rubor, Erythema. Periwound temperature was noted as No Abnormality. 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 I82.401 - Acute embolism and thrombosis of unspecified deep veins of right lower extremity S91.301A - Unspecified open wound, right foot, initial encounter Z92.21 - Personal history of antineoplastic chemotherapy L97.522 - Non-pressure chronic ulcer of other part of left foot with fat layer exposed S41.111A - Laceration without foreign body of right upper arm, initial encounter I have recommended Mepitel for the right forearm, Prisma Ag for the right foot and hydrofera blue for the left ankle. He will come back and see as next week. 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 There was a Skin/Subcutaneous Tissue Debridement ([id number]) debridement with total area of 2.25 sq cm performed by Pat Patrick., MD. with the following instrument(s): Curette to remove Viable and Non-Viable tissue/material including Fibrin/Slough, Eschar, and Subcutaneous after achieving pain control using Lidocaine 4% Topical Solution. Philip [name], Philip [name]  ([id number]) A time out was conducted prior to the start of the procedure. A Minimum amount of bleeding was controlled with Pressure. The procedure was tolerated well with a pain level of 0 throughout and a pain level of 0 following the procedure. Post Debridement Measurements: 1.5cm length x 1.5cm width x 0.3cm depth; 0.53cm^3 volume. Plan Wound Cleansing: Wound #3 Right,Dorsal Foot: Clean wound with wound cleanser. Cleanse wound with mild soap and water May Shower, gently pat wound dry prior to applying new dressing. May shower with protection. Wound #4 Left,Medial Malleolus: Clean wound with wound cleanser. Cleanse wound with mild soap and water May Shower, gently pat wound dry prior to applying new dressing. May shower with protection. Wound #5 Right,Lateral Forearm: Clean wound with wound cleanser. Cleanse wound with mild soap and water May Shower, gently pat wound dry prior to applying new dressing. May shower with protection. Skin Barriers/Peri-Wound Care: Wound #3 Right,Dorsal Foot: Skin Prep Wound #4 Left,Medial Malleolus: Skin Prep Wound #5 Right,Lateral Forearm: Skin Prep Primary Wound Dressing: Wound #3 Right,Dorsal Foot: Prisma Ag Wound #4 Left,Medial Malleolus: Hydrafera Blue Wound #5 Right,Lateral Forearm: Mepitel One Secondary Dressing: Wound #3 Right,Dorsal Foot: Boardered Foam Dressing Wound #4 Left,Medial Malleolus: Boardered Foam Dressing Wound #5 Right,Lateral Forearm: Boardered Foam Dressing Dressing Change Frequency: Wound #3 Right,Dorsal Foot: Philip [name], BELSHE ([id number]) Change dressing every other day. Wound #4 Left,Medial Malleolus: Change dressing every other day. Wound #5 Right,Lateral Forearm: Change dressing every week Follow-up Appointments: Wound #3 Right,Dorsal Foot: Return Appointment in 1 week. Wound #4 Left,Medial Malleolus: Return Appointment in 1 week. Wound #5 Right,Lateral Forearm: Return Appointment in 1 week. Edema  Control: Wound #3 Right,Dorsal Foot: Tubigrip Wound #4 Left,Medial Malleolus: Tubigrip Wound #5 Right,Lateral Forearm: Tubigrip Home Health: Wound #3 Right,Dorsal Foot: [place] Visits - [place] Nurse may visit PRN to address patient s wound care needs. FACE TO FACE ENCOUNTER: MEDICARE and MEDICAID PATIENTS: I certify that this patient is under my care and that I had a face-to-face encounter that meets the physician face-to-face encounter requirements with this patient on this date. The encounter with the patient was in whole or in part for the following MEDICAL CONDITION: (primary reason for [place]) MEDICAL NECESSITY: I certify, that based on my  findings, NURSING services are a medically necessary home health service. HOME BOUND STATUS: I certify that my clinical findings support that this patient is homebound (i.e., Due to illness or injury, pt requires aid of supportive devices such as crutches, cane, wheelchairs, walkers, the use of special transportation or the assistance of another person to leave their place of residence. There is a normal inability to leave the home and doing so requires considerable and taxing effort. Other absences are for medical reasons / religious services and are infrequent or of short duration when for other reasons). If current dressing causes regression in wound condition, may D/C ordered dressing product/s and apply Normal Saline Moist Dressing daily until next [place] / Other MD appointment. [place] of regression in wound condition at [phone]. Please direct any NON-WOUND related issues/requests for orders to patient's Primary Care Physician Wound #4 Left,Medial Malleolus: [place] Visits - [place] Nurse may visit PRN to address patient s wound care needs. FACE TO FACE ENCOUNTER: MEDICARE and MEDICAID PATIENTS: I certify that this patient is under my care  and that I had a face-to-face encounter that meets the physician face-to-face encounter requirements with this patient on this date. The encounter with the patient was in whole or in part for the following MEDICAL CONDITION: (primary reason for [place]) MEDICAL NECESSITY: I certify, that based on my findings, NURSING services are a medically necessary home health service. HOME BOUND STATUS: I certify that my clinical findings support that this patient is homebound (i.e., Due to illness or injury, pt requires aid of supportive devices such as crutches, cane, wheelchairs, walkers, the use of special transportation or the assistance of another person to leave their place of residence. There is a normal inability to leave the home and doing so requires considerable and taxing effort. Other absences are for medical reasons / religious services and are infrequent or of short duration when for other reasons). If current dressing causes regression in wound condition, may D/C ordered dressing product/s and apply JAWAAN, ADACHI ([id number]) Normal Saline Moist Dressing daily until next [place] / Other MD appointment. [place] of regression in wound condition at [phone]. Please direct any NON-WOUND related issues/requests for orders to patient's Primary Care Physician Wound #5 Right,Lateral Forearm: [place] Nurse may visit PRN to address patient s wound care needs. FACE TO FACE ENCOUNTER: MEDICARE and MEDICAID PATIENTS: I certify that this patient is under my care and that I had a face-to-face encounter that meets the physician face-to-face encounter requirements with this patient on this date. The encounter with the patient was in whole or in part for the following MEDICAL CONDITION: (primary reason for [place]) MEDICAL NECESSITY: I certify, that based on my findings, NURSING services are a medically necessary  home health service. HOME BOUND STATUS: I certify that my clinical findings support that this patient is homebound (i.e., Due to illness or injury, pt requires aid of supportive devices such as crutches, cane, wheelchairs, walkers, the use of special transportation or the assistance of another person to leave their place of residence. There is a normal inability to leave the home and doing so requires considerable and taxing effort. Other absences are for medical reasons / religious services and are infrequent or of short duration when for other reasons). If current dressing causes regression in wound condition, may D/C ordered dressing product/s and apply Normal Saline Moist Dressing  daily until next [place] / Other MD appointment. [place] of regression in wound condition at [phone]. Please direct any NON-WOUND related issues/requests for orders to patient's Primary Care Physician I have recommended Mepitel for the right forearm, Prisma Ag for the right foot and hydrofera blue for the left ankle. He will come back and see as next week. Electronic Signature(s) Signed: 11/22/2014 3:04:04 PM By: Christin Fudge MD, FACS Entered By: Christin Fudge on 11/22/2014 15:04:04 Philip [name] ([id number]) -------------------------------------------------------------------------------- SuperBill Details Patient Name: LUVERN, MCISAAC. Date of Service: 11/22/2014 Medical Record Number: [id number] Patient Account Number: [id number] Date of Birth/Sex: 11-22-1918 (81 y.o. Male) Treating RN: Primary Care Physician: Hortencia Pilar Other Clinician: Referring Physician: Hortencia Pilar Treating Physician/Extender: Frann Rider in Treatment: 29 Diagnosis Coding ICD-10 Codes Code Description [id number] 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 I82.401 Acute embolism and thrombosis of unspecified deep veins of right lower extremity S91.301A  Unspecified open wound, right foot, initial encounter Z92.21 Personal history of antineoplastic chemotherapy L97.522 Non-pressure chronic ulcer of other part of left foot with fat layer exposed S41.111A Laceration without foreign body of right upper arm, initial encounter Facility Procedures The patient participates with Medicare or their insurance follows the Medicare Facility Guidelines: CPT4 Code Description Modifier Quantity [id number] 11042 - DEB SUBQ TISSUE 20 SQ CM/< 1 ICD-10 Description Diagnosis I70.232 Atherosclerosis of native  arteries of right leg with ulceration of calf Z92.21 Personal history of antineoplastic chemotherapy S91.301A Unspecified open wound, right foot, initial encounter L97.522 Non-pressure chronic ulcer of other part of left foot with fat layer exposed Physician Procedures CPT4 Code Description: [id number] 11042 - WC PHYS SUBQ TISS 20 SQ CM ICD-10 Description Diagnosis I70.232 Atherosclerosis of native arteries of right leg with ul Z92.21 Personal history of antineoplastic chemotherapy S91.301A Unspecified open wound, right  foot, initial encounter L97.522 Non-pressure chronic ulcer of other part of left foot w Modifier: ceration of ith fat laye Quantity: 1 calf r exposed Electronic Signature(s) Signed: 11/22/2014 3:04:30 PM By: Christin Fudge MD, FACS Entered By: Christin Fudge on 11/22/2014 15:04:30 Philip [name] ([id number])

## 2014-11-23 NOTE — Progress Notes (Signed)
Philip, [name] ([id number]) Visit Report for 11/22/2014 Arrival Information Details Patient Name: Philip [name], Philip [name]. Date of Service: 11/22/2014 2:15 PM Medical Record Number: [id number] Patient Account Number: [id number] Date of Birth/Sex: 1918-09-10 (81 y.o. Male) Treating RN: Baruch Gouty, RN, BSN, Velva Harman Primary Care Physician: Hortencia Pilar Other Clinician: Referring Physician: Hortencia Pilar Treating Physician/Extender: Frann Rider in Treatment: 29 Visit Information History Since Last Visit Any new allergies or adverse reactions: No Patient Arrived: [name] Had a fall or experienced change in No Arrival Time: 14:29 activities of daily living that may affect Accompanied By: dtr risk of falls: Transfer Assistance: None Signs or symptoms of abuse/neglect since last No Patient Identification Verified: Yes visito Secondary Verification Process Yes Hospitalized since last visit: No Completed: Has Dressing in Place as Prescribed: Yes Patient Requires Transmission- No Pain Present Now: No Based Precautions: Patient Has Alerts: Yes Patient Alerts: Patient on Blood Thinner No ABIs dt LE blood clots Electronic Signature(s) Signed: 11/22/2014 3:31:27 PM By: Regan Lemming BSN, RN Entered By: Regan Lemming on 11/22/2014 14:33:13 Philip [name] ([id number]) -------------------------------------------------------------------------------- Encounter Discharge Information Details Patient Name: Philip, [name]. Date of Service: 11/22/2014 2:15 PM Medical Record Number: [id number] Patient Account Number: [id number] Date of Birth/Sex: 1918/06/22 (81 y.o. Male) Treating RN: Baruch Gouty, RN, BSN, Velva Harman Primary Care Physician: Hortencia Pilar Other Clinician: Referring Physician: Hortencia Pilar Treating Physician/Extender: Frann Rider in Treatment: 54 Encounter Discharge Information Items Discharge Pain Level: 0 Discharge Condition: Stable Ambulatory Status: Cane Discharge Destination:  Home Transportation: Private Auto Accompanied By: dtr Schedule Follow-up Appointment: No Medication Reconciliation completed No and provided to Patient/Care Audley Hinojos: Provided on Clinical Summary of Care: 11/22/2014 Form Type Recipient Paper Patient GT Electronic Signature(s) Signed: 11/22/2014 3:15:22 PM By: Ruthine Dose Entered By: Ruthine Dose on 11/22/2014 15:15:22 Philip [name] ([id number]) -------------------------------------------------------------------------------- Lower Extremity Assessment Details Patient Name: [name], MAIN. Date of Service: 11/22/2014 2:15 PM Medical Record Number: [id number] Patient Account Number: [id number] Date of Birth/Sex: November 20, 1918 (81 y.o. Male) Treating RN: Baruch Gouty, RN, BSN, Velva Harman Primary Care Physician: Hortencia Pilar Other Clinician: Referring Physician: Hortencia Pilar Treating Physician/Extender: Frann Rider in Treatment: 29 Edema Assessment Assessed: [Left: No] [Right: No] Edema: [Left: Yes] [Right: Yes] Vascular Assessment Pulses: Posterior Tibial Dorsalis Pedis Palpable: [Left:Yes] [Right:Yes] Extremity colors, hair growth, and conditions: Extremity Color: [Left:Mottled] [Right:Mottled] Hair Growth on Extremity: [Left:No] [Right:No] Temperature of Extremity: [Left:Warm] [Right:Warm] Capillary Refill: [Left:< 3 seconds] [Right:< 3 seconds] Toe Nail Assessment Left: Right: Thick: Yes Discolored: Yes Yes Deformed: Yes Yes Improper Length and Hygiene: No No Electronic Signature(s) Signed: 11/22/2014 3:31:27 PM By: Regan Lemming BSN, RN Entered By: Regan Lemming on 11/22/2014 14:34:28 Philip [name] ([id number]) -------------------------------------------------------------------------------- Multi Wound Chart Details Patient Name: Philip [name]. Date of Service: 11/22/2014 2:15 PM Medical Record Number: [id number] Patient Account Number: [id number] Date of Birth/Sex: 1918-09-15 (81 y.o. 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] [4:Malignant Wound] [5:Skin Tear] Comorbid History: [3:Cataracts, Arrhythmia, Congestive Heart Failure] [4:Cataracts, Arrhythmia, Congestive Heart Failure] [5:Cataracts, Arrhythmia, Congestive Heart Failure] Date Acquired: [3:07/01/2014] [4:09/20/2014] [5:10/01/2014] Weeks of Treatment: [3:19] [4:9] [5:7]  Wound Status: [3:Open] [4:Open] [5:Open] Measurements L x W x D 0.5x0.5x0.1 [4:1.5x1.5x0.3] [5:3x1.5x0.1] (cm) Area (cm) : [3:0.196] [4:1.767] [5:3.534] Volume (cm) : [3:0.02] [4:0.53] [5:0.353] % Reduction in Area: [3:89.60%] [4:-1025.50%] [5:43.80%] % Reduction in Volume: 97.30% [4:-3212.50%] [5:43.80%] Classification: [3:Full Thickness Without Exposed Support Structures] [4:Full Thickness Without Exposed Support Structures] [5:Partial Thickness] Exudate Amount: [3:Small] [4:Small] [5:Small] Exudate Type: [3:Serosanguineous] [4:Serosanguineous] [5:Serous] Exudate Color: [3:red, brown] [4:red, brown] [5:amber] Wound Margin: [3:Distinct, outline attached] [4:Distinct, outline attached] [5:Distinct, outline attached] Granulation Amount: [3:Small (1-33%)] [4:Small (1-33%)] [5:Large (67-100%)] Granulation Quality: [3:Pink] [4:Pink] [5:Red] Necrotic Amount: [3:Small (1-33%)] [4:Medium (34-66%)] [5:None Present (0%)] Exposed Structures: [3:Fascia: No Fat: No Tendon: No Muscle: No Joint: No] [4:Fascia:  No Fat: No Tendon: No Muscle: No Joint: No] [5:Fascia: No Fat: No Tendon: No Muscle: No Joint: No] Bone: No Bone: No Bone: No Limited to Skin Limited to Skin Limited to Skin Breakdown Breakdown Breakdown Epithelialization: None None Large (67-100%) Periwound Skin Texture: Edema: Yes Edema: Yes Edema: No Excoriation: No Excoriation: No Excoriation: No Induration: No Induration: No Induration: No Callus: No Callus: No Callus: No Crepitus: No Crepitus: No Crepitus: No Fluctuance: No Fluctuance: No Fluctuance: No Friable: No Friable: No Friable: No Rash: No Rash: No Rash: No Scarring: No Scarring: No Scarring: No Periwound Skin Moist: Yes Moist: Yes Moist: Yes Moisture: Maceration: No Maceration: No Maceration: No Dry/Scaly: No Dry/Scaly: No Dry/Scaly: No Periwound Skin Color: Atrophie Blanche: No Atrophie Blanche: No Atrophie Blanche: No Cyanosis: No Cyanosis: No Cyanosis: No Ecchymosis: No Ecchymosis: No Ecchymosis: No Erythema: No Erythema: No Erythema: No Hemosiderin Staining: No Hemosiderin Staining: No Hemosiderin Staining: No Mottled: No Mottled: No Mottled: No Pallor: No Pallor: No Pallor: No Rubor: No Rubor: No Rubor: No Temperature: No Abnormality No Abnormality No Abnormality Tenderness on No Yes No Palpation: Wound Preparation: Ulcer Cleansing: Ulcer Cleansing: Ulcer Cleansing: Rinsed/Irrigated with Rinsed/Irrigated with Rinsed/Irrigated with Saline Saline Saline Topical Anesthetic Topical Anesthetic Topical Anesthetic Applied: Other: lidocaine Applied: Other: lidocaine Applied: Other: lidocaine 4% 4% 4% Treatment Notes Electronic Signature(s) Signed: 11/22/2014 3:31:27 PM By: Regan Lemming BSN, RN Entered By: Regan Lemming on 11/22/2014 14:48:52 Philip [name] ([id number]) -------------------------------------------------------------------------------- [place] Details Patient Name: JANZIEL, HOCKETT. Date of  Service: 11/22/2014 2:15 PM Medical Record Number: [id number] Patient Account Number: [id number] Date of Birth/Sex: Mar 30, 1919 (81 y.o. Male) Treating RN: Baruch Gouty, RN, BSN, Velva Harman Primary Care Physician: Hortencia Pilar Other Clinician: Referring Physician: Hortencia Pilar Treating Physician/Extender: Frann Rider in Treatment: 93 Active Inactive Abuse / Safety / Falls / Self Care Management Nursing Diagnoses: Abuse or neglect; actual or potential Potential for falls Goals: Patient will remain injury free Date Initiated: 05/02/2014 Goal Status: Active Interventions: Assess fall risk on admission and as needed Notes: Necrotic Tissue Nursing Diagnoses: Impaired tissue integrity related to necrotic/devitalized tissue Goals: Necrotic/devitalized tissue will be minimized in the wound bed Date Initiated: 05/02/2014 Goal Status: Active Interventions: Assess patient pain level pre-, during and post procedure and prior to discharge Treatment Activities: Apply topical anesthetic as ordered : 11/22/2014 Notes: Orientation to the Wound Care Program Nursing Diagnoses: Knowledge deficit related to the wound healing center program SHADD, DUNSTAN ([id number]) Goals: Patient/caregiver will verbalize understanding of the [place] Program Date Initiated: 05/02/2014 Goal Status: Active Interventions: Provide education on orientation to the wound center Notes: Electronic Signature(s) Signed: 11/22/2014 3:31:27 PM By: Regan Lemming BSN, RN Entered By: Regan Lemming on 11/22/2014 14:47:10 Philip [name] ([id number]) -------------------------------------------------------------------------------- Pain Assessment Details Patient Name: Philip [name].  Date of Service: 11/22/2014 2:15 PM Medical Record Number: [id number] Patient Account Number: [id number] Date of Birth/Sex: 1918-11-17 (81 y.o. Male) Treating RN: Baruch Gouty, RN, BSN, Velva Harman Primary Care Physician: Hortencia Pilar Other  Clinician: Referring Physician: Hortencia Pilar Treating Physician/Extender: Frann Rider in Treatment: 29 Active Problems Location of Pain Severity and Description of Pain Patient Has Paino No Site Locations Pain Management and Medication Current Pain Management: Electronic Signature(s) Signed: 11/22/2014 3:31:27 PM By: Regan Lemming BSN, RN Entered By: Regan Lemming on 11/22/2014 14:33:22 Philip [name] ([id number]) -------------------------------------------------------------------------------- Patient/Caregiver Education Details Patient Name: AGUSTIN, SWATEK. Date of Service: 11/22/2014 2:15 PM Medical Record Number: [id number] Patient Account Number: [id number] Date of Birth/Gender: [birth year] (82 y.o. Male) Treating RN: Baruch Gouty, RN, BSN, Velva Harman Primary Care Physician: Hortencia Pilar Other Clinician: Referring Physician: Hortencia Pilar Treating Physician/Extender: Frann Rider in Treatment: 47 Education Assessment Education Provided To: Patient and Caregiver dtr Education Topics Provided Basic Hygiene: Methods: Explain/Verbal Responses: State content correctly Welcome To [facility] [place]: Methods: Explain/Verbal Responses: State content correctly Electronic Signature(s) Signed: 11/22/2014 3:31:27 PM By: Regan Lemming BSN, RN Entered By: Regan Lemming on 11/22/2014 15:08:13 Philip [name] ([id number]) -------------------------------------------------------------------------------- Wound Assessment Details Patient Name: KENTAVIUS, DETTORE. Date of Service: 11/22/2014 2:15 PM Medical Record Number: [id number] Patient Account Number: [id number] Date of Birth/Sex: [birth year] (81 y.o. Male) Treating RN: Baruch Gouty, RN, BSN, [place] Primary Care Physician: Hortencia Pilar Other Clinician: Referring Physician: Hortencia Pilar Treating Physician/Extender: Frann Rider in Treatment: 29 Wound Status Wound Number: 3 Primary Malignant Wound Etiology: Wound Location: Right Foot -  Dorsal Wound Status: Open Wounding Event: Surgical Injury Comorbid Cataracts, Arrhythmia, Congestive Date Acquired: 07/01/2014 History: Heart Failure Weeks Of Treatment: 19 Clustered Wound: No Photos Photo Uploaded By: Regan Lemming on 11/22/2014 15:30:39 Wound Measurements Length: (cm) 0.5 Width: (cm) 0.5 Depth: (cm) 0.1 Area: (cm) 0.196 Volume: (cm) 0.02 % Reduction in Area: 89.6% % Reduction in Volume: 97.3% Epithelialization: None Tunneling: No Undermining: No Wound Description Full Thickness Without Exposed Classification: Support Structures Wound Margin: Distinct, outline attached Exudate Small Amount: Exudate Type: Serosanguineous Exudate Color: red, brown Foul Odor After Cleansing: No Wound Bed Granulation Amount: Small (1-33%) Exposed Structure Granulation Quality: Pink Fascia Exposed: No Necrotic Amount: Small (1-33%) Fat Layer Exposed: No EVERTTE, SONES ([id number]) Necrotic Quality: Adherent Slough Tendon Exposed: No Muscle Exposed: No Joint Exposed: No Bone Exposed: No Limited to Skin Breakdown Periwound Skin Texture Texture Color No Abnormalities Noted: No No Abnormalities Noted: No Callus: No Atrophie Blanche: No Crepitus: No Cyanosis: No Excoriation: No Ecchymosis: No Fluctuance: No Erythema: No Friable: No Hemosiderin Staining: No Induration: No Mottled: No Localized Edema: Yes Pallor: No Rash: No Rubor: No Scarring: No Temperature / Pain Moisture Temperature: No Abnormality No Abnormalities Noted: No Dry / Scaly: No Maceration: No Moist: Yes Wound Preparation Ulcer Cleansing: Rinsed/Irrigated with Saline Topical Anesthetic Applied: Other: lidocaine 4%, Treatment Notes Wound #3 (Right, Dorsal Foot) 1. Cleansed with: Clean wound with Normal Saline 3. Peri-wound Care: Skin Prep 4. Dressing Applied: Prisma Ag 5. Secondary Dressing Applied Bordered Foam Dressing 7. Secured with Financial controller) Signed: 11/22/2014 3:31:27 PM By: Regan Lemming BSN, RN Entered By: Regan Lemming on 11/22/2014 14:41:15 Philip [name] ([id number]) -------------------------------------------------------------------------------- Wound Assessment Details Patient Name: ANANDA, SITZER. Date of Service: 11/22/2014 2:15 PM Medical Record Number: [id number] Patient Account Number: [id number] Date of Birth/Sex: Mar 04, 1919 (81 y.o. Male) Treating RN: Baruch Gouty, RN, BSN, Velva Harman Primary Care Physician: Hortencia Pilar Other Clinician: Referring Physician: Hoy Morn,  Heidi Treating Physician/Extender: Frann Rider in Treatment: 29 Wound Status Wound Number: 4 Primary Malignant Wound Etiology: Wound Location: Left Malleolus - Medial Wound Status: Open Wounding Event: Other Lesion Comorbid Cataracts, Arrhythmia, Congestive Date Acquired: 09/20/2014 History: Heart Failure Weeks Of Treatment: 9 Clustered Wound: No Photos Photo Uploaded By: Regan Lemming on 11/22/2014 15:30:39 Wound Measurements Length: (cm) 1.5 Width: (cm) 1.5 Depth: (cm) 0.3 Area: (cm) 1.767 Volume: (cm) 0.53 % Reduction in Area: -1025.5% % Reduction in Volume: -3212.5% Epithelialization: None Tunneling: No Undermining: No Wound Description Full Thickness Without Exposed Classification: Support Structures Wound Margin: Distinct, outline attached Exudate Small Amount: Exudate Type: Serosanguineous Exudate Color: red, brown Foul Odor After Cleansing: No Wound Bed Granulation Amount: Small (1-33%) Exposed Structure Granulation Quality: Pink Fascia Exposed: No Necrotic Amount: Medium (34-66%) Fat Layer Exposed: No JEREMAIH, KLIMA ([id number]) Necrotic Quality: Adherent Slough Tendon Exposed: No Muscle Exposed: No Joint Exposed: No Bone Exposed: No Limited to Skin Breakdown Periwound Skin Texture Texture Color No Abnormalities Noted: No No Abnormalities Noted: No Callus: No Atrophie Blanche: No Crepitus:  No Cyanosis: No Excoriation: No Ecchymosis: No Fluctuance: No Erythema: No Friable: No Hemosiderin Staining: No Induration: No Mottled: No Localized Edema: Yes Pallor: No Rash: No Rubor: No Scarring: No Temperature / Pain Moisture Temperature: No Abnormality No Abnormalities Noted: No Tenderness on Palpation: Yes Dry / Scaly: No Maceration: No Moist: Yes Wound Preparation Ulcer Cleansing: Rinsed/Irrigated with Saline Topical Anesthetic Applied: Other: lidocaine 4%, Treatment Notes Wound #4 (Left, Medial Malleolus) 1. Cleansed with: Clean wound with Normal Saline 3. Peri-wound Care: Skin Prep 4. Dressing Applied: Hydrafera Blue 5. Secondary Dressing Applied Bordered Foam Dressing 7. Secured with Financial risk analyst) Signed: 11/22/2014 3:31:27 PM By: Regan Lemming BSN, RN Entered By: Regan Lemming on 11/22/2014 14:41:40 Philip [name] ([id number]) -------------------------------------------------------------------------------- Wound Assessment Details Patient Name: HASSEL, UPHOFF. Date of Service: 11/22/2014 2:15 PM Medical Record Number: [id number] Patient Account Number: [id number] Date of Birth/Sex: Dec 21, 1918 (81 y.o. Male) Treating RN: Baruch Gouty, RN, BSN, [place] Primary Care Physician: Hortencia Pilar Other Clinician: Referring Physician: Hortencia Pilar Treating Physician/Extender: Frann Rider in Treatment: 29 Wound Status Wound Number: 5 Primary Skin Tear Etiology: Wound Location: Right Forearm - Lateral Wound Status: Open Wounding Event: Shear/Friction Comorbid Cataracts, Arrhythmia, Congestive Date Acquired: 10/01/2014 History: Heart Failure Weeks Of Treatment: 7 Clustered Wound: No Photos Photo Uploaded By: Regan Lemming on 11/22/2014 15:31:12 Wound Measurements Length: (cm) 3 Width: (cm) 1.5 Depth: (cm) 0.1 Area: (cm) 3.534 Volume: (cm) 0.353 % Reduction in Area: 43.8% % Reduction in Volume: 43.8% Epithelialization: Large  (67-100%) Tunneling: No Undermining: No Wound Description Classification: Partial Thickness Wound Margin: Distinct, outline attached Exudate Amount: Small Exudate Type: Serous Exudate Color: amber Foul Odor After Cleansing: No Wound Bed Granulation Amount: Large (67-100%) Exposed Structure Granulation Quality: Red Fascia Exposed: No Necrotic Amount: None Present (0%) Fat Layer Exposed: No Tendon Exposed: No SHADY, BRADISH ([id number]) Muscle Exposed: No Joint Exposed: No Bone Exposed: No Limited to Skin Breakdown Periwound Skin Texture Texture Color No Abnormalities Noted: No No Abnormalities Noted: No Callus: No Atrophie Blanche: No Crepitus: No Cyanosis: No Excoriation: No Ecchymosis: No Fluctuance: No Erythema: No Friable: No Hemosiderin Staining: No Induration: No Mottled: No Localized Edema: No Pallor: No Rash: No Rubor: No Scarring: No Temperature / Pain Moisture Temperature: No Abnormality No Abnormalities Noted: No Dry / Scaly: No Maceration: No Moist: Yes Wound Preparation Ulcer Cleansing: Rinsed/Irrigated with Saline Topical Anesthetic Applied: Other: lidocaine 4%, Treatment Notes Wound #5 (  Right, Lateral Forearm) 1. Cleansed with: Clean wound with Normal Saline 3. Peri-wound Care: Skin Prep 4. Dressing Applied: Mepitel 5. Secondary Dressing Applied Bordered Foam Dressing Dry Gauze Notes mepitel only Electronic Signature(s) Signed: 11/22/2014 3:31:27 PM By: Regan Lemming BSN, RN Entered By: Regan Lemming on 11/22/2014 14:41:56 Philip [name] ([id number]) -------------------------------------------------------------------------------- [place] Details Patient Name: Philip [name]. Date of Service: 11/22/2014 2:15 PM Medical Record Number: [id number] Patient Account Number: [id number] Date of Birth/Sex: [birth year] (81 y.o. Male) Treating RN: Baruch Gouty, RN, BSN, [place] Primary Care Physician: Hortencia Pilar Other Clinician: Referring Physician:  Hortencia Pilar Treating Physician/Extender: Frann Rider in Treatment: 29 Vital Signs Time Taken: 14:33 Temperature (F): 97.9 Height (in): 69 Pulse (bpm): 70 Weight (lbs): 179 Respiratory Rate (breaths/min): 18 Body Mass Index (BMI): 26.4 Blood Pressure (mmHg): 106/66 Reference Range: 80 - 120 mg / dl Electronic Signature(s) Signed: 11/22/2014 3:31:27 PM By: Regan Lemming BSN, RN Entered By: Regan Lemming on 11/22/2014 14:33:46

## 2014-11-29 ENCOUNTER — Encounter: Payer: Medicare Other | Admitting: Surgery

## 2014-11-29 DIAGNOSIS — L97522 Non-pressure chronic ulcer of other part of left foot with fat layer exposed: Secondary | ICD-10-CM | POA: Diagnosis not present

## 2014-12-03 NOTE — Progress Notes (Signed)
HENDRYX, RICKE ([id number]) Visit Report for 11/29/2014 Arrival Information Details Patient Name: Philip [name], Philip [name]. Date of Service: 11/29/2014 3:00 PM Medical Record Number: [id number] Patient Account Number: [id number] Date of Birth/Sex: 05-03-1918 (81 y.o. Male) Treating RN: Cornell Barman Primary Care Physician: Hortencia Pilar Other Clinician: Referring Physician: Hortencia Pilar Treating Physician/Extender: Frann Rider in Treatment: 54 Visit Information History Since Last Visit Added or deleted any medications: No Patient Arrived: [name] Any new allergies or adverse reactions: No Arrival Time: 14:56 Had a fall or experienced change in No Accompanied By: daughter activities of daily living that may affect Transfer Assistance: None risk of falls: Patient Identification Verified: Yes Signs or symptoms of abuse/neglect since last No Secondary Verification Process Yes visito Completed: Hospitalized since last visit: No Patient Requires Transmission- No Has Dressing in Place as Prescribed: Yes Based Precautions: Pain Present Now: No Patient Has Alerts: Yes Patient Alerts: Patient on Blood Thinner No ABIs dt LE blood clots Electronic Signature(s) Signed: 12/02/2014 6:31:19 PM By: Gretta Cool, RN, BSN, Kim RN, BSN Entered By: Gretta Cool, RN, BSN, Kim on 11/29/2014 14:57:00 [name] ([id number]) -------------------------------------------------------------------------------- Clinic Level of Care Assessment Details Patient Name: [name], [name]. Date of Service: 11/29/2014 3:00 PM Medical Record Number: [id number] Patient Account Number: [id number] Date of Birth/Sex: [birth year] (81 y.o. Male) Treating RN: Cornell Barman Primary Care Physician: Hortencia Pilar Other Clinician: Referring Physician: Hortencia Pilar Treating Physician/Extender: Frann Rider in Treatment: 30 Clinic Level of Care Assessment Items TOOL 4 Quantity Score []  - Use when only an EandM is performed on  FOLLOW-UP visit 0 ASSESSMENTS - Nursing Assessment / Reassessment []  - Reassessment of Co-morbidities (includes updates in patient status) 0 X - Reassessment of Adherence to Treatment Plan 1 5 ASSESSMENTS - Wound and Skin Assessment / Reassessment []  - Simple Wound Assessment / Reassessment - one wound 0 X - Complex Wound Assessment / Reassessment - multiple wounds 3 5 []  - Dermatologic / Skin Assessment (not related to wound area) 0 ASSESSMENTS - Focused Assessment []  - Circumferential Edema Measurements - multi extremities 0 []  - Nutritional Assessment / Counseling / Intervention 0 []  - Lower Extremity Assessment (monofilament, tuning fork, pulses) 0 []  - Peripheral Arterial Disease Assessment (using hand held doppler) 0 ASSESSMENTS - Ostomy and/or Continence Assessment and Care []  - Incontinence Assessment and Management 0 []  - Ostomy Care Assessment and Management (repouching, etc.) 0 PROCESS - Coordination of Care []  - Simple Patient / Family Education for ongoing care 0 X - Complex (extensive) Patient / Family Education for ongoing care 1 20 []  - Staff obtains Programmer, systems, Records, Test Results / Process Orders 0 []  - Staff telephones HHA, Nursing Homes / Clarify orders / etc 0 []  - Routine Transfer to another Facility (non-emergent condition) 0 IOANNIS, [facility] ([id number]) []  - Routine Hospital Admission (non-emergent condition) 0 []  - New Admissions / Biomedical engineer / Ordering NPWT, Apligraf, etc. 0 []  - Emergency Hospital Admission (emergent condition) 0 []  - Simple Discharge Coordination 0 X - Complex (extensive) Discharge Coordination 1 15 PROCESS - Special Needs []  - Pediatric / Minor Patient Management 0 []  - Isolation Patient Management 0 []  - Hearing / Language / Visual special needs 0 []  - Assessment of Community assistance (transportation, D/C planning, etc.) 0 []  - Additional assistance / Altered mentation 0 []  - Support Surface(s) Assessment (bed, cushion,  seat, etc.) 0 INTERVENTIONS - Wound Cleansing / Measurement []  - Simple Wound Cleansing - one wound 0 X - Complex Wound Cleansing - multiple  wounds 3 5 []  - Wound Imaging (photographs - any number of wounds) 0 X - Wound Tracing (instead of photographs) 1 5 []  - Simple Wound Measurement - one wound 0 X - Complex Wound Measurement - multiple wounds 3 5 INTERVENTIONS - Wound Dressings []  - Small Wound Dressing one or multiple wounds 0 X - Medium Wound Dressing one or multiple wounds 3 15 []  - Large Wound Dressing one or multiple wounds 0 []  - Application of Medications - topical 0 []  - Application of Medications - injection 0 INTERVENTIONS - Miscellaneous []  - External ear exam 0 QUY, LOTTS ([id number]) []  - Specimen Collection (cultures, biopsies, blood, body fluids, etc.) 0 []  - Specimen(s) / Culture(s) sent or taken to Lab for analysis 0 []  - Patient Transfer (multiple staff / Harrel Lemon Lift / Similar devices) 0 []  - Simple Staple / Suture removal (25 or less) 0 []  - Complex Staple / Suture removal (26 or more) 0 []  - Hypo / Hyperglycemic Management (close monitor of Blood Glucose) 0 []  - Ankle / Brachial Index (ABI) - do not check if billed separately 0 X - Vital Signs 1 5 Has the patient been seen at the hospital within the last three years: Yes Total Score: 140 Level Of Care: New/Established - Level 4 Electronic Signature(s) Signed: 12/02/2014 6:31:19 PM By: Gretta Cool, RN, BSN, Kim RN, BSN Entered By: Gretta Cool, RN, BSN, Kim on 11/29/2014 15:41:38 [name] ([id number]) -------------------------------------------------------------------------------- Encounter Discharge Information Details Patient Name: [name], [name]. Date of Service: 11/29/2014 3:00 PM Medical Record Number: [id number] Patient Account Number: [id number] Date of Birth/Sex: 1918/08/11 (81 y.o. Male) Treating RN: Montey Hora Primary Care Physician: Hortencia Pilar Other Clinician: Referring Physician: Hortencia Pilar Treating Physician/Extender: Frann Rider in Treatment: 30 Encounter Discharge Information Items Discharge Pain Level: 0 Discharge Condition: Stable Ambulatory Status: Ambulatory Discharge Destination: Home Transportation: Private Auto Accompanied By: daughter Schedule Follow-up Appointment: Yes Medication Reconciliation completed and provided to Patient/Care Yes Kendallyn Lippold: Provided on Clinical Summary of Care: 11/29/2014 Form Type Recipient Paper Patient GT Electronic Signature(s) Signed: 12/02/2014 6:31:19 PM By: Gretta Cool, RN, BSN, Kim RN, BSN Previous Signature: 11/29/2014 3:39:26 PM Version By: Sharon Mt Entered By: Gretta Cool RN, BSN, Kim on 11/29/2014 15:43:33 [name] ([id number]) -------------------------------------------------------------------------------- Lower Extremity Assessment Details Patient Name: KETRICK, MATNEY. Date of Service: 11/29/2014 3:00 PM Medical Record Number: [id number] Patient Account Number: [id number] Date of Birth/Sex: [birth year] (81 y.o. Male) Treating RN: Cornell Barman Primary Care Physician: Hortencia Pilar Other Clinician: Referring Physician: Hortencia Pilar Treating Physician/Extender: Frann Rider in Treatment: 30 Vascular Assessment Pulses: Posterior Tibial Dorsalis Pedis Palpable: [Left:Yes] [Right:Yes] Extremity colors, hair growth, and conditions: Extremity Color: [Left:Mottled] [Right:Mottled] Hair Growth on Extremity: [Left:No] Temperature of Extremity: [Left:Warm] [Right:Warm] Capillary Refill: [Left:< 3 seconds] [Right:< 3 seconds] Toe Nail Assessment Left: Right: Thick: Yes Yes Discolored: Yes Yes Deformed: No No Improper Length and Hygiene: No No Electronic Signature(s) Signed: 12/02/2014 6:31:19 PM By: Gretta Cool, RN, BSN, Kim RN, BSN Entered By: Gretta Cool, RN, BSN, Kim on 11/29/2014 15:08:28 [name] ([id number]) -------------------------------------------------------------------------------- Multi  Wound Chart Details Patient Name: DEEPAK, BLESS. Date of Service: 11/29/2014 3:00 PM Medical Record Number: [id number] Patient Account Number: [id number] Date of Birth/Sex: Apr 23, 1918 (81 y.o. 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] [4:Malignant Wound] [5:Skin Tear] Comorbid History: [3:Cataracts, Arrhythmia, Congestive Heart Failure] [4:Cataracts, Arrhythmia, Congestive Heart Failure] [5:Cataracts, Arrhythmia, Congestive Heart Failure] Date Acquired: [3:07/01/2014] [4:09/20/2014] [5:10/01/2014] Weeks of Treatment: [3:20] [4:10] [5:8] Wound Status: [3:Open] [4:Open] [5:Open] Measurements L x W x D 0.4x0.3x0.1 [4:1.8x1.8x0.4] [5:7.5x6x0.1] (cm) Area (cm) : [3:0.094] [4:2.545] [5:35.343] Volume (cm) : [3:0.009] [4:1.018] [5:3.534] % Reduction in Area: [3:95.00%] [4:-1521.00%] [5:-462.50%] % Reduction in Volume: 98.80% [4:-6262.50%] [5:-462.70%] Classification: [3:Full Thickness Without Exposed Support Structures] [4:Full Thickness Without Exposed Support Structures] [5:Partial Thickness] Exudate Amount: [3:Small] [4:Small] [5:Small] Exudate Type: [3:Serosanguineous] [4:Serosanguineous] [5:Serous] Exudate Color: [3:red, brown] [4:red, brown] [5:amber] Wound Margin: [3:Distinct, outline attached] [4:Distinct, outline attached] [5:Distinct, outline attached] Granulation Amount: [3:Small (1-33%)] [4:Small (1-33%)] [5:Large  (67-100%)] Granulation Quality: [3:Pink] [4:Pink] [5:Red] Necrotic Amount: [3:Small (1-33%)] [4:Medium (34-66%)] [5:None Present (0%)] Exposed Structures: [3:Fascia: No Fat: No Tendon: No Muscle: No Joint: No] [4:Fascia: No Fat: No Tendon: No Muscle: No Joint: No] [5:Fascia: No Fat: No Tendon: No Muscle: No Joint: No] Bone: No Bone: No Bone: No Limited to Skin Limited to Skin Limited to Skin Breakdown Breakdown Breakdown Epithelialization: None None Large (67-100%) Periwound Skin Texture: Edema: Yes Edema: Yes Edema: No Excoriation: No Excoriation: No Excoriation: No Induration: No Induration: No Induration: No Callus: No Callus: No Callus: No Crepitus: No Crepitus: No Crepitus: No Fluctuance: No Fluctuance: No Fluctuance: No Friable: No Friable: No Friable: No Rash: No Rash: No Rash: No Scarring: No Scarring: No Scarring: No Periwound Skin Moist: Yes Moist: Yes Moist: Yes Moisture: Maceration: No Maceration: No Maceration: No Dry/Scaly: No Dry/Scaly: No Dry/Scaly: No Periwound Skin Color: Atrophie Blanche: No Atrophie Blanche: No Atrophie Blanche: No Cyanosis: No Cyanosis: No Cyanosis: No Ecchymosis: No Ecchymosis: No Ecchymosis: No Erythema: No Erythema: No Erythema: No Hemosiderin Staining: No Hemosiderin Staining: No Hemosiderin Staining: No Mottled: No Mottled: No Mottled: No Pallor: No Pallor: No Pallor: No Rubor: No Rubor: No Rubor: No Temperature: No Abnormality No Abnormality No Abnormality Tenderness on No Yes No Palpation: Wound Preparation: Ulcer Cleansing: Ulcer Cleansing: Ulcer Cleansing: Rinsed/Irrigated with Rinsed/Irrigated with Rinsed/Irrigated with Saline Saline Saline Topical Anesthetic Topical Anesthetic Topical Anesthetic Applied: Other: lidocaine Applied: Other: lidocaine Applied: Other: lidocaine 4% 4% 4% Treatment Notes Electronic Signature(s) Signed: 12/02/2014 6:31:19 PM By: Gretta Cool, RN, BSN, Kim RN, BSN Entered  By: Gretta Cool, RN, BSN, Kim on 11/29/2014 15:21:22 DANN, GALICIA ([id number]) -------------------------------------------------------------------------------- [place] Details Patient Name: SIMONE, TUCKEY. Date of Service: 11/29/2014 3:00 PM Medical Record Number: [id number] Patient Account Number: [id number] Date of Birth/Sex: 1919-01-10 (81 y.o. Male) Treating RN: Cornell Barman Primary Care Physician: Hortencia Pilar Other Clinician: Referring Physician: Hortencia Pilar Treating Physician/Extender: Frann Rider in Treatment: 65 Active Inactive Abuse / Safety / Falls / Self Care Management Nursing Diagnoses: Abuse or neglect; actual or potential Potential for falls Goals: Patient will remain injury free Date Initiated: 05/02/2014 Goal Status: Active Interventions: Assess fall risk on admission and as needed Notes: Necrotic Tissue Nursing Diagnoses: Impaired tissue integrity related to necrotic/devitalized tissue Goals: Necrotic/devitalized tissue will be minimized in the wound bed Date Initiated: 05/02/2014 Goal Status: Active Interventions: Assess patient pain level pre-, during and post procedure and prior to discharge Treatment Activities: Apply topical anesthetic as ordered : 11/29/2014 Notes: Orientation to the Wound Care Program Nursing Diagnoses: Knowledge deficit related to  the wound healing center program NAJEEB, UPTAIN ([id number]) Goals: Patient/caregiver will verbalize understanding of the [place] Program Date Initiated: 05/02/2014 Goal Status: Active Interventions: Provide education on orientation to the wound center Notes: Electronic Signature(s) Signed: 12/02/2014 6:31:19 PM By: Gretta Cool, RN, BSN, Kim RN, BSN Entered By: Gretta Cool, RN, BSN, Kim on 11/29/2014 15:21:14 [name] ([id number]) -------------------------------------------------------------------------------- Pain Assessment Details Patient Name: KEONTE, DAUBENSPECK. Date of Service: 11/29/2014 3:00 PM Medical Record Number: [id number] Patient Account Number: [id number] Date of Birth/Sex: 1918/06/04 (81 y.o. Male) Treating RN: Cornell Barman Primary Care Physician: Hortencia Pilar Other Clinician: Referring Physician: Hortencia Pilar Treating Physician/Extender: Frann Rider in Treatment: 30 Active Problems Location of Pain Severity and Description of Pain Patient Has Paino No Site Locations Pain Management and Medication Current Pain Management: Electronic Signature(s) Signed: 12/02/2014 6:31:19 PM By: Gretta Cool, RN, BSN, Kim RN, BSN Entered By: Gretta Cool, RN, BSN, Kim on 11/29/2014 14:57:06 [name] ([id number]) -------------------------------------------------------------------------------- Patient/Caregiver Education Details Patient Name: KEONTA, ALSIP. Date of Service: 11/29/2014 3:00 PM Medical Record Number: [id number] Patient Account Number: [id number] Date of Birth/Gender: 09-Mar-1919 (81 y.o. Male) Treating RN: Cornell Barman Primary Care Physician: Hortencia Pilar Other Clinician: Referring Physician: Hortencia Pilar Treating Physician/Extender: Frann Rider in Treatment: 30 Education Assessment Education Provided To: Patient Education Topics Provided Wound/Skin Impairment: Handouts: Caring for Your Ulcer, Other: continue wound care as prescribed Electronic Signature(s) Signed: 12/02/2014 6:31:19 PM By: Gretta Cool, RN, BSN, Kim RN, BSN Entered By: Gretta Cool, RN, BSN, Kim on 11/29/2014 15:43:55 [name] ([id number]) -------------------------------------------------------------------------------- Wound Assessment Details Patient Name: ADISA, LITT. Date of Service: 11/29/2014 3:00 PM Medical Record Number: [id number] Patient Account Number: [id number] Date of Birth/Sex: November 10, 1918 (81 y.o. Male) Treating RN: Cornell Barman Primary Care Physician: Hortencia Pilar Other Clinician: Referring Physician: Hortencia Pilar Treating  Physician/Extender: Frann Rider in Treatment: 30 Wound Status Wound Number: 3 Primary Malignant Wound Etiology: Wound Location: Right Foot - Dorsal Wound Status: Open Wounding Event: Surgical Injury Comorbid Cataracts, Arrhythmia, Congestive Date Acquired: 07/01/2014 History: Heart Failure Weeks Of Treatment: 20 Clustered Wound: No Photos Photo Uploaded By: Gretta Cool, RN, BSN, Kim on 11/29/2014 16:57:35 Wound Measurements Length: (cm) 0.4 Width: (cm) 0.3 Depth: (cm) 0.1 Area: (cm) 0.094 Volume: (cm) 0.009 % Reduction in Area: 95% % Reduction in Volume: 98.8% Epithelialization: None Wound Description Full Thickness Without Exposed Classification: Support Structures Wound Margin: Distinct, outline attached Exudate Small Amount: Exudate Type: Serosanguineous Exudate Color: red, brown Foul Odor After Cleansing: No Wound Bed Granulation Amount: Small (1-33%) Exposed Structure Granulation Quality: Pink Fascia Exposed: No Necrotic Amount: Small (1-33%) Fat Layer Exposed: No JHONATHAN, DESROCHES ([id number]) Necrotic Quality: Adherent Slough Tendon Exposed: No Muscle Exposed: No Joint Exposed: No Bone Exposed: No Limited to Skin Breakdown Periwound Skin Texture Texture Color No Abnormalities Noted: No No Abnormalities Noted: No Callus: No Atrophie Blanche: No Crepitus: No Cyanosis: No Excoriation: No Ecchymosis: No Fluctuance: No Erythema: No Friable: No Hemosiderin Staining: No Induration: No Mottled: No Localized Edema: Yes Pallor: No Rash: No Rubor: No Scarring: No Temperature / Pain Moisture Temperature: No Abnormality No Abnormalities Noted: No Dry / Scaly: No Maceration: No Moist: Yes Wound Preparation Ulcer Cleansing: Rinsed/Irrigated with Saline Topical Anesthetic Applied: Other: lidocaine 4%, Treatment Notes Wound #3 (Right, Dorsal Foot) 1. Cleansed with: Clean wound with Normal Saline 2. Anesthetic Topical Lidocaine 4% cream to  wound bed prior to debridement 4. Dressing Applied: Prisma Ag 5. Secondary Dressing Applied Bordered Foam Dressing Electronic Signature(s) Signed: 12/02/2014  6:31:19 PM By: Gretta Cool, RN, BSN, Kim RN, BSN Entered By: Gretta Cool, RN, BSN, Kim on 11/29/2014 15:10:39 NICHOLAS, TROMPETER ([id number]) -------------------------------------------------------------------------------- Wound Assessment Details Patient Name: CALLAGHAN, LAVERDURE. Date of Service: 11/29/2014 3:00 PM Medical Record Number: [id number] Patient Account Number: [id number] Date of Birth/Sex: 05-20-1918 (81 y.o. Male) Treating RN: Cornell Barman Primary Care Physician: Hortencia Pilar Other Clinician: Referring Physician: Hortencia Pilar Treating Physician/Extender: Frann Rider in Treatment: 30 Wound Status Wound Number: 4 Primary Malignant Wound Etiology: Wound Location: Left Malleolus - Medial Wound Status: Open Wounding Event: Other Lesion Comorbid Cataracts, Arrhythmia, Congestive Date Acquired: 09/20/2014 History: Heart Failure Weeks Of Treatment: 10 Clustered Wound: No Photos Photo Uploaded By: Gretta Cool, RN, BSN, Kim on 11/29/2014 16:57:35 Wound Measurements Length: (cm) 1.8 Width: (cm) 1.8 Depth: (cm) 0.4 Area: (cm) 2.545 Volume: (cm) 1.018 % Reduction in Area: -1521% % Reduction in Volume: -6262.5% Epithelialization: None Wound Description Full Thickness Without Exposed Classification: Support Structures Wound Margin: Distinct, outline attached Exudate Small Amount: Exudate Type: Serosanguineous Exudate Color: red, brown Foul Odor After Cleansing: No Wound Bed Granulation Amount: Small (1-33%) Exposed Structure Granulation Quality: Pink Fascia Exposed: No Necrotic Amount: Medium (34-66%) Fat Layer Exposed: No SYLER, NORCIA ([id number]) Necrotic Quality: Adherent Slough Tendon Exposed: No Muscle Exposed: No Joint Exposed: No Bone Exposed: No Limited to Skin Breakdown Periwound Skin  Texture Texture Color No Abnormalities Noted: No No Abnormalities Noted: No Callus: No Atrophie Blanche: No Crepitus: No Cyanosis: No Excoriation: No Ecchymosis: No Fluctuance: No Erythema: No Friable: No Hemosiderin Staining: No Induration: No Mottled: No Localized Edema: Yes Pallor: No Rash: No Rubor: No Scarring: No Temperature / Pain Moisture Temperature: No Abnormality No Abnormalities Noted: No Tenderness on Palpation: Yes Dry / Scaly: No Maceration: No Moist: Yes Wound Preparation Ulcer Cleansing: Rinsed/Irrigated with Saline Topical Anesthetic Applied: Other: lidocaine 4%, Treatment Notes Wound #4 (Left, Medial Malleolus) 1. Cleansed with: Cleanse wound with antibacterial soap and water 2. Anesthetic Topical Lidocaine 4% cream to wound bed prior to debridement 4. Dressing Applied: Hydrafera Blue 5. Secondary Dressing Applied Bordered Foam Dressing Electronic Signature(s) Signed: 12/02/2014 6:31:19 PM By: Gretta Cool, RN, BSN, Kim RN, BSN Entered By: Gretta Cool, RN, BSN, Kim on 11/29/2014 15:10:57 [name] ([id number]) -------------------------------------------------------------------------------- Wound Assessment Details Patient Name: ARISTIDES, LUCKEY. Date of Service: 11/29/2014 3:00 PM Medical Record Number: [id number] Patient Account Number: [id number] Date of Birth/Sex: 12-19-1918 (81 y.o. Male) Treating RN: Cornell Barman Primary Care Physician: Hortencia Pilar Other Clinician: Referring Physician: Hortencia Pilar Treating Physician/Extender: Frann Rider in Treatment: 30 Wound Status Wound Number: 5 Primary Skin Tear Etiology: Wound Location: Right Forearm - Lateral Wound Status: Open Wounding Event: Shear/Friction Comorbid Cataracts, Arrhythmia, Congestive Date Acquired: 10/01/2014 History: Heart Failure Weeks Of Treatment: 8 Clustered Wound: No Photos Photo Uploaded By: Gretta Cool, RN, BSN, Kim on 11/29/2014 16:57:50 Wound Measurements Length:  (cm) 7.5 Width: (cm) 6 Depth: (cm) 0.1 Area: (cm) 35.343 Volume: (cm) 3.534 % Reduction in Area: -462.5% % Reduction in Volume: -462.7% Epithelialization: Large (67-100%) Wound Description Classification: Partial Thickness Wound Margin: Distinct, outline attached Exudate Amount: Small Exudate Type: Serous Exudate Color: amber Foul Odor After Cleansing: No Wound Bed Granulation Amount: Large (67-100%) Exposed Structure Granulation Quality: Red Fascia Exposed: No Necrotic Amount: None Present (0%) Fat Layer Exposed: No Tendon Exposed: No BURR, SOFFER. ([id number]) Muscle Exposed: No Joint Exposed: No Bone Exposed: No Limited to Skin Breakdown Periwound Skin Texture Texture Color No Abnormalities Noted: No No Abnormalities Noted: No Callus: No Atrophie Blanche:  No Crepitus: No Cyanosis: No Excoriation: No Ecchymosis: No Fluctuance: No Erythema: No Friable: No Hemosiderin Staining: No Induration: No Mottled: No Localized Edema: No Pallor: No Rash: No Rubor: No Scarring: No Temperature / Pain Moisture Temperature: No Abnormality No Abnormalities Noted: No Dry / Scaly: No Maceration: No Moist: Yes Wound Preparation Ulcer Cleansing: Rinsed/Irrigated with Saline Topical Anesthetic Applied: Other: lidocaine 4%, Treatment Notes Wound #5 (Right, Lateral Forearm) 1. Cleansed with: Cleanse wound with antibacterial soap and water 2. Anesthetic Topical Lidocaine 4% cream to wound bed prior to debridement 4. Dressing Applied: Other dressing (specify in notes) 5. Secondary Dressing Applied Bordered Foam Dressing Notes drawtex Electronic Signature(s) Signed: 12/02/2014 6:31:19 PM By: Gretta Cool, RN, BSN, Kim RN, BSN Entered By: Gretta Cool, RN, BSN, Kim on 11/29/2014 15:12:45 [name] ([id number]) -------------------------------------------------------------------------------- [place] Details Patient Name: KHAIRI, GARMAN. Date of Service: 11/29/2014 3:00  PM Medical Record Number: [id number] Patient Account Number: [id number] Date of Birth/Sex: 18-May-1918 (81 y.o. Male) Treating RN: Cornell Barman Primary Care Physician: Hortencia Pilar Other Clinician: Referring Physician: Hortencia Pilar Treating Physician/Extender: Frann Rider in Treatment: 30 Vital Signs Time Taken: 14:56 Pulse (bpm): 80 Height (in): 69 Respiratory Rate (breaths/min): 18 Weight (lbs): 179 Blood Pressure (mmHg): 128/68 Body Mass Index (BMI): 26.4 Reference Range: 80 - 120 mg / dl Electronic Signature(s) Signed: 12/02/2014 6:31:19 PM By: Gretta Cool, RN, BSN, Kim RN, BSN Entered By: Gretta Cool, RN, BSN, Kim on 11/29/2014 14:57:31

## 2014-12-03 NOTE — Progress Notes (Signed)
DAMION, KANT ([id number]) Visit Report for 11/29/2014 Chief Complaint Document Details Patient Name: Philip [name], Philip [name]. Date of Service: 11/29/2014 3:00 PM Medical Record Number: [id number] Patient Account Number: [id number] Date of Birth/Sex: 07-09-1918 (81 y.o. Male) Treating RN: Montey Hora Primary Care Physician: Hortencia Pilar Other Clinician: Referring Physician: Hortencia Pilar Treating Physician/Extender: Frann Rider in Treatment: 30 Information Obtained from: Patient Chief Complaint R foot ulcer. L forearm ulcer. 07/19/2014 -- about 2 weeks ago he had a surgical procedure done by dermatologist in [place] and has an open surgical wound on the dorsum of the right foot. Electronic Signature(s) Signed: 11/29/2014 3:37:01 PM By: Christin Fudge MD, FACS Entered By: Christin Fudge on 11/29/2014 15:37:01 [name] ([id number]) -------------------------------------------------------------------------------- HPI Details Patient Name: Philip [name], Philip [name]. Date of Service: 11/29/2014 3:00 PM Medical Record Number: [id number] Patient Account Number: [id number] Date of Birth/Sex: 1919-04-15 (81 y.o. Male) Treating RN: Montey Hora Primary Care Physician: Hortencia Pilar Other Clinician: Referring Physician: Hortencia Pilar Treating Physician/Extender: Frann Rider in Treatment: 30 History of Present Illness Location: right leg Duration: Dec 2015 Modifying Factors: history of an injury to the right leg with resulting hematoma and thrombophlebitis and later an ulcer of posterior leg Associated Signs and Symptoms: marked lymphedema of the right leg. He is already on Eloquis. HPI Description: 06/14/14 -- He returns for followup today. He denies any fevers. no fresh issues and his daughter says he's been doing fine. 06/21/14 -- after he sustained a fall this week earlier he applied a bandage over this himself and did not seek any medical attention. he did however manage to  control the bleeding and had a dressing in place the next morning when his son to the visit. In this dressing was removed there was further damaged skin. His right leg has been doing fine otherwise. 07/12/14 --Very pleasant 81 year old with past medical history significant for congestive heart failure (EF 15%), peripheral vascular disease, and chronic kidney disease. He was hospitalized at [facility] in December 2015 for congestive heart failure. He says that he fell during his hospital course and developed a hematoma over his right calf. He was also diagnosed with a right lower extremity DVT for which he takes Eliquis. The hematoma subsequently turned into an ulceration around Christmas, which has healed. He subsequently developed an ulcer on his right dorsal foot and a traumatic left forearm ulcer. Per his report, he underwent biopsy of the right dorsal foot ulceration which demonstrated a skin cancer. His PCP and dermatologist office are both closed today. I reviewed his records in [place] but find no report of biopsy or pathology. He s without complaints today. No significant pain. No fever or chills. Minimal drainage. 07/19/2014 - the patient and his son tell me that about 2 weeks ago the dermatologist did a skin biopsy and this was a large area on the dorsum of his right foot which was left open and no dressing instructions were recommended. Since then he has been called and told that it is a cancer and the need to do a further procedure but that will not happen until about 2 weeks from now. In the meanwhile the patient has not been taking care of his right foot. The left forearm where he had an abrasion and laceration is doing very well. 07/26/2014 -- Reports from 07/08/2014 from the dermatology group reviewed. A excision was done of a lesion located on the dorsum of the right foot and this was 1.7 cm in diameter which was a shave  biopsy performed. The wound was left open after appropriate  cauterization and the patient was given this dressing instructions. 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 08/02/2014 -- all the original problems he came with have completely resolved. He now has a surgical wound on his right foot dorsum where a skin cancer was excised. He goes to see his dermatologist this ALASTOR, KNEALE ([id number]) coming Tuesday and will have definite news next Friday. 08/09/2014 he had gone to his dermatologist on Tuesday and she has injected the base of his ulcer with some chemotherapeutic agent. He was supposed to bring some papers with him but forgot to get them and will bring them in next week. Other than that the dermatologist had suggested using Mehdi honey on the wound. 08/16/2014 -- the patient has brought in his notes from the dermatologist and on 08/06/2014 he received a injection of 5 FU, 500 mg grams into the lesion. the pathology report was also sent and it was a squamous cell carcinoma well-differentiated and edges were involved. They wanted him to use many honey for the wound dressing changes to be done 3 times a week. 08/30/2014 -- he has finished his second injection of 5-FU and has the next one in 2 weeks' time. He is doing fine otherwise. 09/13/2014 - No new complaints. No significant pain. No fever or chills. Minimal drainage. Still receiving 5-FU injections. 09/20/2014 -- He was seen by the dermatologist on 09/10/2014 and Dr. Phillip Heal injected his foot both the right on the dorsum and left near the medial malleolus with 5-FU. The next dose of 5-FU is to be given after 3 months. The patient says he now has a spot on the left medial malleolus where he was injected with 5-FU. 09/27/2014 -- the area on the left ankle where he was injected with 5-FU is now a full-blown ulcer. He also has mild pain in both ankle areas. 10/04/2014 - large had a bit of a fall and  injured his right arm last evening and has had a laceration with no evidence of any foreign body in the right forearm. Electronic Signature(s) Signed: 11/29/2014 3:37:10 PM By: Christin Fudge MD, FACS Entered By: Christin Fudge on 11/29/2014 15:37:10 [name] ([id number]) -------------------------------------------------------------------------------- Physical Exam Details Patient Name: Philip [name], Philip [name]. Date of Service: 11/29/2014 3:00 PM Medical Record Number: [id number] Patient Account Number: [id number] Date of Birth/Sex: February 03, 1919 (81 y.o. Male) Treating RN: Montey Hora Primary Care Physician: Hortencia Pilar Other Clinician: Referring Physician: Hortencia Pilar Treating Physician/Extender: Frann Rider in Treatment: 30 Constitutional . Pulse regular. Respirations normal and unlabored. Afebrile. . Eyes Nonicteric. Reactive to light. Ears, Nose, Mouth, and Throat Lips, teeth, and gums WNL.Marland Kitchen Moist mucosa without lesions . Neck supple and nontender. No palpable supraclavicular or cervical adenopathy. Normal sized without goiter. Respiratory WNL. No retractions.. Cardiovascular Pedal Pulses WNL. No clubbing, cyanosis or edema. Lymphatic No adneopathy. No adenopathy. No adenopathy. Musculoskeletal Adexa without tenderness or enlargement.. Digits and nails w/o clubbing, cyanosis, infection, petechiae, ischemia, or inflammatory conditions.. Integumentary (Hair, Skin) No suspicious lesions. No crepitus or fluctuance. No peri-wound warmth or erythema. No masses.Marland Kitchen Psychiatric Judgement and insight Intact.. No evidence of depression, anxiety, or agitation.. Notes His right arm got wet and has some macerated skin. The right foot dorsum looks excellent and is almost completely healed. The left medial ulceration has some granulation tissue but there is still some deep fascia which is visible. Electronic  Signature(s) Signed: 11/29/2014 3:38:11 PM By: Christin Fudge MD,  FACS Entered By: Christin Fudge on 11/29/2014 15:38:11 [name] ([id number]) -------------------------------------------------------------------------------- Physician Orders Details Patient Name: CAVION, FAIOLA. Date of Service: 11/29/2014 3:00 PM Medical Record Number: [id number] Patient Account Number: [id number] Date of Birth/Sex: May 19, 1918 (81 y.o. Male) Treating RN: Cornell Barman Primary Care Physician: Hortencia Pilar Other Clinician: Referring Physician: Hortencia Pilar Treating Physician/Extender: Frann Rider in Treatment: 30 Verbal / Phone Orders: Yes Clinician: Cornell Barman Read Back and Verified: Yes Diagnosis Coding Wound Cleansing Wound #3 Right,Dorsal Foot o Clean wound with wound cleanser. o Cleanse wound with mild soap and water o May Shower, gently pat wound dry prior to applying new dressing. o May shower with protection. Wound #4 Left,Medial Malleolus o Clean wound with wound cleanser. o Cleanse wound with mild soap and water o May Shower, gently pat wound dry prior to applying new dressing. o May shower with protection. Wound #5 Right,Lateral Forearm o Clean wound with wound cleanser. o Cleanse wound with mild soap and water o May Shower, gently pat wound dry prior to applying new dressing. o May shower with protection. Skin Barriers/Peri-Wound Care Wound #3 Right,Dorsal Foot o Skin Prep Wound #4 Left,Medial Malleolus o Skin Prep Wound #5 Right,Lateral Forearm o Skin Prep Primary Wound Dressing Wound #5 Right,Lateral Forearm o Drawtex Wound #4 Left,Medial Malleolus o Hydrafera Blue Wound #3 Right,Dorsal Foot QUENTON, RECENDEZ. ([id number]) o Prisma Ag Secondary Dressing Wound #3 Right,Dorsal Foot o Boardered Foam Dressing Wound #4 Left,Medial Malleolus o Boardered Foam Dressing Wound #5 Right,Lateral Forearm o Boardered Foam Dressing Dressing Change Frequency Wound #3 Right,Dorsal Foot o  Change dressing every other day. Wound #4 Left,Medial Malleolus o Change dressing every other day. Wound #5 Right,Lateral Forearm o Change dressing every week Follow-up Appointments Wound #3 Right,Dorsal Foot o Return Appointment in 1 week. Wound #4 Left,Medial Malleolus o Return Appointment in 1 week. Wound #5 Right,Lateral Forearm o Return Appointment in 1 week. Edema Control Wound #3 Right,Dorsal Foot o Tubigrip Wound #4 Left,Medial Malleolus o Tubigrip Home Health Wound #3 [place] Visits - [place] Nurse may visit PRN to address patientos wound care needs. o FACE TO FACE ENCOUNTER: MEDICARE and MEDICAID PATIENTS: I certify that this patient is under my care and that I had a face-to-face encounter that meets the physician face-to-face encounter requirements with this patient on this date. The encounter with the patient was in whole or in part for the following MEDICAL CONDITION: (primary reason for [place]) ARTICE, BERGERSON ([id number]) MEDICAL NECESSITY: I certify, that based on my findings, NURSING services are a medically necessary home health service. HOME BOUND STATUS: I certify that my clinical findings support that this patient is homebound (i.e., Due to illness or injury, pt requires aid of supportive devices such as crutches, cane, wheelchairs, walkers, the use of special transportation or the assistance of another person to leave their place of residence. There is a normal inability to leave the home and doing so requires considerable and taxing effort. Other absences are for medical reasons / religious services and are infrequent or of short duration when for other reasons). o If current dressing causes regression in wound condition, may D/C ordered dressing product/s and apply Normal Saline Moist Dressing daily until next [place] / Other MD appointment. [place] of regression in wound condition at [phone]. o Please direct any NON-WOUND related issues/requests for orders to patient's  Primary Care Physician Wound #4 [place] Visits - [place] Nurse may visit PRN to address patientos wound care needs. o FACE TO FACE ENCOUNTER: MEDICARE and MEDICAID PATIENTS: I certify that this patient is under my care and that I had a face-to-face encounter that meets the physician face-to-face encounter requirements with this patient on this date. The encounter with the patient was in whole or in part for the following MEDICAL CONDITION: (primary reason for [place]) MEDICAL NECESSITY: I certify, that based on my findings, NURSING services are a medically necessary home health service. HOME BOUND STATUS: I certify that my clinical findings support that this patient is homebound (i.e., Due to illness or injury, pt requires aid of supportive devices such as crutches, cane, wheelchairs, walkers, the use of special transportation or the assistance of another person to leave their place of residence. There is a normal inability to leave the home and doing so requires considerable and taxing effort. Other absences are for medical reasons / religious services and are infrequent or of short duration when for other reasons). o If current dressing causes regression in wound condition, may D/C ordered dressing product/s and apply Normal Saline Moist Dressing daily until next [place] / Other MD appointment. [place] of regression in wound condition at [phone]. o Please direct any NON-WOUND related issues/requests for orders to patient's Primary Care Physician Wound #5 Right,Lateral Forearm o [place] Visits - [place] Nurse may visit PRN to address patientos wound care needs. o FACE TO FACE ENCOUNTER: MEDICARE and MEDICAID  PATIENTS: I certify that this patient is under my care and that I had a face-to-face encounter that meets the physician face-to-face encounter requirements with this patient on this date. The encounter with the patient was in whole or in part for the following MEDICAL CONDITION: (primary reason for [place]) MEDICAL NECESSITY: I certify, that based on my findings, NURSING services are a medically necessary home health service. HOME BOUND STATUS: I certify that my clinical findings support that this patient is homebound (i.e., Due to illness or injury, pt requires aid of supportive devices such as crutches, cane, wheelchairs, walkers, the use of special transportation or the assistance of another person to leave their place of residence. There is a normal inability to leave the home and doing so requires considerable and taxing effort. Other absences are for medical reasons / religious services and are infrequent or of short duration when for other reasons). AKEEN, LEDYARD ([id number]) o If current dressing causes regression in wound condition, may D/C ordered dressing product/s and apply Normal Saline Moist Dressing daily until next [place] / Other MD appointment. [name] of regression in wound condition at [phone]. o Please direct any NON-WOUND related issues/requests for orders to patient's Primary Care Physician Electronic Signature(s) Signed: 11/29/2014 4:10:06 PM By: Christin Fudge MD, FACS Signed: 12/02/2014 6:31:19 PM By: Gretta Cool RN, BSN, Kim RN, BSN Entered By: Gretta Cool, RN, BSN, Kim on 11/29/2014 15:25:49 [name] ([id number]) -------------------------------------------------------------------------------- Problem List Details Patient Name: Philip [name], Philip [name]. Date of Service: 11/29/2014 3:00 PM Medical Record Number: [id number] Patient Account Number: [id number] Date of Birth/Sex: [birth year] (81 y.o. Male) Treating RN: Montey Hora Primary Care Physician: Hortencia Pilar Other Clinician: Referring Physician: Hortencia Pilar Treating Physician/Extender: Frann Rider in Treatment: 30 Active Problems ICD-10 Encounter Code Description Active Date Diagnosis I70.232 Atherosclerosis of native arteries of right leg  with 06/21/2014 Yes ulceration of calf I82.401 Acute embolism and thrombosis of unspecified deep veins 06/21/2014 Yes of right lower extremity S91.301A Unspecified open wound, right foot, initial encounter 07/19/2014 Yes Z92.21 Personal history of antineoplastic chemotherapy 08/23/2014 Yes L97.522 Non-pressure chronic ulcer of other part of left foot with fat 09/20/2014 Yes layer exposed S41.111A Laceration without foreign body of right upper arm, initial 10/04/2014 Yes encounter Inactive Problems Resolved Problems ICD-10 Code Description Active Date Resolved Date L97.212 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 Date of Service: 11/29/2014 3:00 PM Medical Record Number: [id number] Patient Account Number: [id number] Date of Birth/Sex: 22-Sep-1918 (81 y.o. Male) Treating RN: Montey Hora Primary Care Physician: Hortencia Pilar Other Clinician: Referring Physician: Hortencia Pilar Treating Physician/Extender: Frann Rider in Treatment: 30 Subjective Chief Complaint Information obtained from Patient R foot ulcer. L forearm ulcer. 07/19/2014 -- about 2 weeks ago he had a surgical procedure done by dermatologist in [place] and has an open surgical  wound on the dorsum of the right foot. History of Present Illness (HPI) The following HPI elements were documented for the patient's wound: Location: right leg Duration: Dec 2015 Modifying Factors: history of an injury to the right leg with resulting hematoma and thrombophlebitis and later an ulcer of posterior leg Associated Signs and Symptoms: marked lymphedema of the right leg. He is already on Eloquis. 06/14/14 -- He returns for followup today. He denies any fevers. no fresh issues and his daughter says he's been doing fine. 06/21/14 -- after he sustained a fall this week earlier he applied a bandage over this himself and did not seek any medical attention. he did however manage to control the bleeding and had a dressing in place the next morning when his son to the visit. In this dressing was removed there was further damaged skin. His right leg has been doing fine otherwise. 07/12/14 --Very pleasant 81 year old with past medical history significant for congestive heart failure (EF 15%), peripheral vascular disease, and chronic kidney disease. He was hospitalized at [facility] in December 2015 for congestive heart failure. He says that he fell during his hospital course and developed a hematoma over his right calf. He was also diagnosed with a right lower extremity DVT for which he takes Eliquis. The hematoma subsequently turned into an ulceration around Christmas, which has healed. He subsequently developed an ulcer on his right dorsal foot and a traumatic left forearm ulcer. Per his report, he underwent biopsy of the right dorsal foot ulceration which demonstrated a skin cancer. His PCP and dermatologist office are both closed today. I reviewed his records in [place] but find no report of biopsy or pathology. He s without complaints today. No significant pain. No fever or chills. Minimal drainage. 07/19/2014 - the patient and his son tell me that about 2 weeks ago the dermatologist did a skin  biopsy and this was a large area on the dorsum of his right foot which was left open and no dressing instructions were recommended. Since then he has been called and told that it is a cancer and the need to do a further procedure but that will not happen until about 2 weeks from now. In the meanwhile the patient  has not been taking care of his right foot. The left forearm where he had an abrasion and laceration is doing very well. ANTWONE, CAPOZZOLI ([id number]) 07/26/2014 -- Reports from 07/08/2014 from the dermatology group reviewed. A excision was done of a lesion located on the dorsum of the right foot and this was 1.7 cm in diameter which was a shave biopsy performed. The wound was left open after appropriate cauterization and the patient was given this dressing instructions. 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 08/02/2014 -- all the original problems he came with have completely resolved. He now has a surgical wound on his right foot dorsum where a skin cancer was excised. He goes to see his dermatologist this coming Tuesday and will have definite news next Friday. 08/09/2014 he had gone to his dermatologist on Tuesday and she has injected the base of his ulcer with some chemotherapeutic agent. He was supposed to bring some papers with him but forgot to get them and will bring them in next week. Other than that the dermatologist had suggested using Mehdi honey on the wound. 08/16/2014 -- the patient has brought in his notes from the dermatologist and on 08/06/2014 he received a injection of 5 FU, 500 mg grams into the lesion. the pathology report was also sent and it was a squamous cell carcinoma well-differentiated and edges were involved. They wanted him to use many honey for the wound dressing changes to be done 3 times a week. 08/30/2014 -- he has finished his second injection of 5-FU and  has the next one in 2 weeks' time. He is doing fine otherwise. 09/13/2014 - No new complaints. No significant pain. No fever or chills. Minimal drainage. Still receiving 5-FU injections. 09/20/2014 -- He was seen by the dermatologist on 09/10/2014 and Dr. Phillip Heal injected his foot both the right on the dorsum and left near the medial malleolus with 5-FU. The next dose of 5-FU is to be given after 3 months. The patient says he now has a spot on the left medial malleolus where he was injected with 5-FU. 09/27/2014 -- the area on the left ankle where he was injected with 5-FU is now a full-blown ulcer. He also has mild pain in both ankle areas. 10/04/2014 - large had a bit of a fall and injured his right arm last evening and has had a laceration with no evidence of any foreign body in the right forearm. Objective Constitutional Pulse regular. Respirations normal and unlabored. Afebrile. Vitals Time Taken: 2:56 PM, Height: 69 in, Weight: 179 lbs, BMI: 26.4, Pulse: 80 bpm, Respiratory Rate: 18 breaths/min, Blood Pressure: 128/68 mmHg. LARZ, MARK ([id number]) Eyes Nonicteric. Reactive to light. Ears, Nose, Mouth, and Throat Lips, teeth, and gums WNL.Marland Kitchen Moist mucosa without lesions . Neck supple and nontender. No palpable supraclavicular or cervical adenopathy. Normal sized without goiter. Respiratory WNL. No retractions.. Cardiovascular Pedal Pulses WNL. No clubbing, cyanosis or edema. Lymphatic No adneopathy. No adenopathy. No adenopathy. Musculoskeletal Adexa without tenderness or enlargement.. Digits and nails w/o clubbing, cyanosis, infection, petechiae, ischemia, or inflammatory conditions.Marland Kitchen Psychiatric Judgement and insight Intact.. No evidence of depression, anxiety, or agitation.. General Notes: His right arm got wet and has some macerated skin. The right foot dorsum looks excellent and is almost completely healed. The left medial ulceration has some granulation tissue but  there is still some deep fascia which is visible. Integumentary (Hair, Skin) No suspicious lesions.  No crepitus or fluctuance. No peri-wound warmth or erythema. No masses.. Wound #3 status is Open. Original cause of wound was Surgical Injury. The wound is located on the Right,Dorsal Foot. The wound measures 0.4cm length x 0.3cm width x 0.1cm depth; 0.094cm^2 area and 0.009cm^3 volume. The wound is limited to skin breakdown. There is a small amount of serosanguineous drainage noted. The wound margin is distinct with the outline attached to the wound base. There is small (1-33%) pink granulation within the wound bed. There is a small (1-33%) amount of necrotic tissue within the wound bed including Adherent Slough. The periwound skin appearance exhibited: Localized Edema, Moist. The periwound skin appearance did not exhibit: Callus, Crepitus, Excoriation, Fluctuance, Friable, Induration, Rash, Scarring, Dry/Scaly, Maceration, Atrophie Blanche, Cyanosis, Ecchymosis, Hemosiderin Staining, Mottled, Pallor, Rubor, Erythema. Periwound temperature was noted as No Abnormality. Wound #4 status is Open. Original cause of wound was Other Lesion. The wound is located on the Left,Medial Malleolus. The wound measures 1.8cm length x 1.8cm width x 0.4cm depth; 2.545cm^2 area and 1.018cm^3 volume. The wound is limited to skin breakdown. There is a small amount of serosanguineous drainage noted. The wound margin is distinct with the outline attached to the wound base. There is small (1-33%) pink granulation within the wound bed. There is a medium (34-66%) amount of necrotic tissue within the wound bed including Adherent Slough. The periwound skin appearance exhibited: Localized Edema, Moist. The periwound skin appearance did not exhibit: Callus, Crepitus, Excoriation, Fluctuance, Friable, Induration, Rash, Scarring, Dry/Scaly, Maceration, Atrophie Blanche, Cyanosis, CRISTHIAN, VANHOOK. ([id number]) Ecchymosis,  Hemosiderin Staining, Mottled, Pallor, Rubor, Erythema. Periwound temperature was noted as No Abnormality. The periwound has tenderness on palpation. Wound #5 status is Open. Original cause of wound was Shear/Friction. The wound is located on the Right,Lateral Forearm. The wound measures 7.5cm length x 6cm width x 0.1cm depth; 35.343cm^2 area and 3.534cm^3 volume. The wound is limited to skin breakdown. There is a small amount of serous drainage noted. The wound margin is distinct with the outline attached to the wound base. There is large (67-100%) red granulation within the wound bed. There is no necrotic tissue within the wound bed. The periwound skin appearance exhibited: Moist. The periwound skin appearance did not exhibit: Callus, Crepitus, Excoriation, Fluctuance, Friable, Induration, Localized Edema, Rash, Scarring, Dry/Scaly, Maceration, Atrophie Blanche, Cyanosis, Ecchymosis, Hemosiderin Staining, Mottled, Pallor, Rubor, Erythema. Periwound temperature was noted as No Abnormality. 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 We will use Prisma for his right foot and Hydrofera Blue on his left medial ankle. I have spoken to K[place] to ask for help when he showers so that he does not get this wound wet and macerated. Plan Wound Cleansing: Wound #3 Right,Dorsal Foot: Clean wound with wound cleanser. Cleanse wound with mild soap  and water May Shower, gently pat wound dry prior to applying new dressing. May shower with protection. MOMEN, HAM ([id number]) Wound #4 Left,Medial Malleolus: Clean wound with wound cleanser. Cleanse wound with  mild soap and water May Shower, gently pat wound dry prior to applying new dressing. May shower with protection. Wound #5 Right,Lateral Forearm: Clean wound with wound cleanser. Cleanse wound with mild soap and water May Shower, gently pat wound dry prior to applying new dressing. May shower with protection. Skin Barriers/Peri-Wound Care: Wound #3 Right,Dorsal Foot: Skin Prep Wound #4 Left,Medial Malleolus: Skin Prep Wound #5 Right,Lateral Forearm: Skin Prep Primary Wound Dressing: Wound #5 Right,Lateral Forearm: Drawtex Wound #4 Left,Medial Malleolus: Hydrafera Blue Wound #3 Right,Dorsal Foot: Prisma Ag Secondary Dressing: Wound #3 Right,Dorsal Foot: Boardered Foam Dressing Wound #4 Left,Medial Malleolus: Boardered Foam Dressing Wound #5 Right,Lateral Forearm: Boardered Foam Dressing Dressing Change Frequency: Wound #3 Right,Dorsal Foot: Change dressing every other day. Wound #4 Left,Medial Malleolus: Change dressing every other day. Wound #5 Right,Lateral Forearm: Change dressing every week Follow-up Appointments: Wound #3 Right,Dorsal Foot: Return Appointment in 1 week. Wound #4 Left,Medial Malleolus: Return Appointment in 1 week. Wound #5 Right,Lateral Forearm: Return Appointment in 1 week. Edema Control: Wound #3 Right,Dorsal Foot: Tubigrip Wound #4 Left,Medial Malleolus: [place]: DANTON, PALMATEER ([id number]) Wound #3 Right,Dorsal Foot: [place] Visits - [place] Nurse may visit PRN to address patient s wound care needs. FACE TO FACE ENCOUNTER: MEDICARE and MEDICAID PATIENTS: I certify that this patient is under my care and that I had a face-to-face encounter that meets the physician face-to-face  encounter requirements with this patient on this date. The encounter with the patient was in whole or in part for the following MEDICAL CONDITION: (primary reason for [place]) MEDICAL NECESSITY: I certify, that based on my findings, NURSING services are a medically necessary home health service. HOME BOUND STATUS: I certify that my clinical findings support that this patient is homebound (i.e., Due to illness or injury, pt requires aid of supportive devices such as crutches, cane, wheelchairs, walkers, the use of special transportation or the assistance of another person to leave their place of residence. There is a normal inability to leave the home and doing so requires considerable and taxing effort. Other absences are for medical reasons / religious services and are infrequent or of short duration when for other reasons). If current dressing causes regression in wound condition, may D/C ordered dressing product/s and apply Normal Saline Moist Dressing daily until next [place] / Other MD appointment. [place] of regression in wound condition at [phone]. Please direct any NON-WOUND related issues/requests for orders to patient's Primary Care Physician Wound #4 Left,Medial Malleolus: [place] Visits - [place] Nurse may visit PRN to address patient s wound care needs. FACE TO FACE ENCOUNTER: MEDICARE and MEDICAID PATIENTS: I certify that this patient is under my care and that I had a face-to-face encounter that meets the physician face-to-face encounter requirements with this patient on this date. The encounter with the patient was in whole or in part for the following MEDICAL CONDITION: (primary reason for [place]) MEDICAL NECESSITY: I certify, that based on my findings, NURSING services are a medically necessary home health service. HOME BOUND STATUS: I certify that my clinical findings support that this patient is  homebound (i.e., Due to illness or injury, pt requires aid of supportive devices such as crutches, cane, wheelchairs, walkers, the use of special transportation or the assistance of another person to leave their place of residence. There is a normal inability to leave the home and doing so requires considerable and taxing effort. Other absences are  for medical reasons / religious services and are infrequent or of short duration when for other reasons). If current dressing causes regression in wound condition, may D/C ordered dressing product/s and apply Normal Saline Moist Dressing daily until next [place] / Other MD appointment. [place] of regression in wound condition at [phone]. Please direct any NON-WOUND related issues/requests for orders to patient's Primary Care Physician Wound #5 Right,Lateral Forearm: [place] Nurse may visit PRN to address patient s wound care needs. FACE TO FACE ENCOUNTER: MEDICARE and MEDICAID PATIENTS: I certify that this patient is under my care and that I had a face-to-face encounter that meets the physician face-to-face encounter requirements with this patient on this date. The encounter with the patient was in whole or in part for the following MEDICAL CONDITION: (primary reason for [place]) MEDICAL NECESSITY: I certify, that based on my findings, NURSING services are a medically necessary home health service. HOME BOUND STATUS: I certify that my clinical findings support that this patient is homebound (i.e., Due to illness or injury, pt requires aid of supportive devices such as crutches, cane, wheelchairs, walkers, the use of special transportation or the assistance of another person to leave their place of residence. There is a normal inability to leave the home and doing so requires considerable and taxing effort. Other absences are for medical reasons / religious  services and are infrequent or of short duration when for other reasons). If current dressing causes regression in wound condition, may D/C ordered dressing product/s and apply Normal Saline Moist Dressing daily until next [place] / Other MD appointment. [place] of regression in wound condition at [phone]. Please direct any NON-WOUND related issues/requests for orders to patient's Primary Care Physician INDIO, SANTILLI. ([id number]) I recommended a piece of DrawTex over his right arm and will make sure he does not get this wet the entire week. We will use Prisma for his right foot and Hydrofera Blue on his left medial ankle. I have spoken to [place] to ask for help when he showers so that he does not get this wound wet and macerated. Electronic Signature(s) Signed: 11/29/2014 3:39:17 PM By: Christin Fudge MD, FACS Entered By: Christin Fudge on 11/29/2014 15:39:17 [name] ([id number]) -------------------------------------------------------------------------------- SuperBill Details Patient Name: Philip [name], Philip [name]. Date of Service: 11/29/2014 Medical Record Number: [id number] Patient Account Number: [id number] Date of Birth/Sex: March 09, 1919 (81 y.o. 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 S41.111A Laceration without foreign body of right upper arm, initial encounter Facility  Procedures The patient participates with Medicare or their insurance follows the Medicare Facility Guidelines: CPT4 Code Description Modifier Quantity [id number] [place] VISIT-LEV 4 EST PT 1 Physician Procedures CPT4 Code Description: [id number] - WC PHYS LEVEL 3 - EST PT ICD-10 Description Diagnosis I70.232 Atherosclerosis of native arteries of right leg with ul S41.111A Laceration without foreign body of right upper arm, ini S91.301A Unspecified open wound,  right foot, initial encounter L97.522 Non-pressure chronic ulcer  of other part of left foot w Modifier: ceration of tial encount ith fat laye Quantity: 1 calf er r exposed Electronic Signature(s) Signed: 12/02/2014 12:25:57 PM By: Christin Fudge MD, FACS Signed: 12/02/2014 6:31:19 PM By: Gretta Cool RN, BSN, Kim RN, BSN Previous Signature: 11/29/2014 3:39:42 PM Version By: Christin Fudge MD, FACS Entered By: Gretta Cool RN, BSN, Kim on 12/02/2014 10:12:09

## 2014-12-06 ENCOUNTER — Encounter: Payer: Medicare Other | Admitting: Surgery

## 2014-12-06 DIAGNOSIS — L97522 Non-pressure chronic ulcer of other part of left foot with fat layer exposed: Secondary | ICD-10-CM | POA: Diagnosis not present

## 2014-12-07 NOTE — Progress Notes (Signed)
Philip [name] ([id number]) Visit Report for 12/06/2014 Arrival Information Details Patient Name: Philip [name], Philip [name]. Date of Service: 12/06/2014 3:15 PM Medical Record Number: [id number] Patient Account Number: [id number] Date of Birth/Sex: 04-Jun-1918 (81 y.o. Male) Treating RN: Philip [name] Primary Care Physician: Philip [name] Other Clinician: Referring Physician: Hortencia [name] Treating Physician/Extender: Philip [name] in Treatment: 12 Visit Information History Since Last Visit Added or deleted any medications: No Patient Arrived: Philip [name] Any new allergies or adverse reactions: No Arrival Time: 15:18 Had a fall or experienced change in No Accompanied By: self activities of daily living that may affect Transfer Assistance: None risk of falls: Patient Identification Verified: Yes Signs or symptoms of abuse/neglect since last No Secondary Verification Process Yes visito Completed: Hospitalized since last visit: No Patient Requires Transmission- No Has Dressing in Place as Prescribed: Yes Based Precautions: Has Compression in Place as Prescribed: Yes Patient Has Alerts: Yes Pain Present Now: No Patient Alerts: Patient on Blood Thinner No ABIs dt LE blood clots Electronic Signature(s) Signed: 12/06/2014 5:07:03 PM By: Philip [name] Entered By: Philip Cool, RN, [name], Kim on 12/06/2014 15:19:07 Philip [name] ([id number]) -------------------------------------------------------------------------------- Clinic Level of Care Assessment Details Patient Name: Philip [name], Philip [name]. Date of Service: 12/06/2014 3:15 PM Medical Record Number: [id number] Patient Account Number: [id number] Date of Birth/Sex: 08-16-1918 (81 y.o. Male) Treating RN: Philip [name] Primary Care Physician: Philip [name] Other Clinician: Referring Physician: Hortencia [name] Treating Physician/Extender: Philip [name] in Treatment: 31 Clinic Level of Care Assessment Items TOOL 4 Quantity Score []  -  Use when only an EandM is performed on FOLLOW-UP visit 0 ASSESSMENTS - Nursing Assessment / Reassessment []  - Reassessment of Co-morbidities (includes updates in patient status) 0 X - Reassessment of Adherence to Treatment Plan 1 5 ASSESSMENTS - Wound and Skin Assessment / Reassessment []  - Simple Wound Assessment / Reassessment - one wound 0 X - Complex Wound Assessment / Reassessment - multiple wounds 3 5 []  - Dermatologic / Skin Assessment (not related to wound area) 0 ASSESSMENTS - Focused Assessment []  - Circumferential Edema Measurements - multi extremities 0 []  - Nutritional Assessment / Counseling / Intervention 0 []  - Lower Extremity Assessment (monofilament, tuning fork, pulses) 0 []  - Peripheral Arterial Disease Assessment (using hand held doppler) 0 ASSESSMENTS - Ostomy and/or Continence Assessment and Care []  - Incontinence Assessment and Management 0 []  - Ostomy Care Assessment and Management (repouching, etc.) 0 PROCESS - Coordination of Care X - Simple Patient / Family Education for ongoing care 1 15 []  - Complex (extensive) Patient / Family Education for ongoing care 0 []  - Staff obtains Programmer, systems, Records, Test Results / Process Orders 0 []  - Staff telephones HHA, Nursing Homes / Clarify orders / etc 0 []  - Routine Transfer to another Facility (non-emergent condition) 0 Philip [name] ([id number]) []  - Routine Hospital Admission (non-emergent condition) 0 []  - New Admissions / Biomedical engineer / Ordering NPWT, Apligraf, etc. 0 []  - Emergency Hospital Admission (emergent condition) 0 X - Simple Discharge Coordination 1 10 []  - Complex (extensive) Discharge Coordination 0 PROCESS - Special Needs []  - Pediatric / Minor Patient Management 0 []  - Isolation Patient Management 0 []  - Hearing / Language / Visual special needs 0 []  - Assessment of Community assistance (transportation, D/C planning, etc.) 0 []  - Additional assistance / Altered mentation 0 []  -  Support Surface(s) Assessment (bed, cushion, seat, etc.) 0 INTERVENTIONS - Wound Cleansing / Measurement []  - Simple Wound Cleansing - one wound 0  X - Complex Wound Cleansing - multiple wounds 3 5 []  - Wound Imaging (photographs - any number of wounds) 0 []  - Wound Tracing (instead of photographs) 0 []  - Simple Wound Measurement - one wound 0 X - Complex Wound Measurement - multiple wounds 3 5 INTERVENTIONS - Wound Dressings []  - Small Wound Dressing one or multiple wounds 0 X - Medium Wound Dressing one or multiple wounds 3 15 []  - Large Wound Dressing one or multiple wounds 0 []  - Application of Medications - topical 0 []  - Application of Medications - injection 0 INTERVENTIONS - Miscellaneous []  - External ear exam 0 Philip [name] ([id number]) []  - Specimen Collection (cultures, biopsies, blood, body fluids, etc.) 0 []  - Specimen(s) / Culture(s) sent or taken to Lab for analysis 0 []  - Patient Transfer (multiple staff / Civil Service fast streamer / Similar devices) 0 []  - Simple Staple / Suture removal (25 or less) 0 []  - Complex Staple / Suture removal (26 or more) 0 []  - Hypo / Hyperglycemic Management (close monitor of Blood Glucose) 0 []  - Ankle / Brachial Index (ABI) - do not check if billed separately 0 X - Vital Signs 1 5 Has the patient been seen at the hospital within the last three years: Yes Total Score: 125 Level Of Care: New/Established - Level 4 Electronic Signature(s) Signed: 12/06/2014 5:07:03 PM By: Philip [name] Entered By: Philip Cool, RN, [name], Kim on 12/06/2014 15:58:35 Philip [name] ([id number]) -------------------------------------------------------------------------------- Encounter Discharge Information Details Patient Name: Philip [name], Philip [name]. Date of Service: 12/06/2014 3:15 PM Medical Record Number: [id number] Patient Account Number: [id number] Date of Birth/Sex: [birth year] (81 y.o. Male) Treating RN: Philip [name] Primary Care Physician: Philip [name] Other  Clinician: Referring Physician: Hortencia [name] Treating Physician/Extender: Philip [name] in Treatment: 58 Encounter Discharge Information Items Discharge Pain Level: 0 Discharge Condition: Stable Ambulatory Status: Cane Discharge Destination: Home Transportation: Private Auto Accompanied By: daughter Schedule Follow-up Appointment: Yes Medication Reconciliation completed Yes and provided to Patient/Care Natayah Warmack: Provided on Clinical Summary of Care: 12/06/2014 Form Type Recipient Paper Patient GT Electronic Signature(s) Signed: 12/06/2014 5:07:03 PM By: Philip Cool RN, [name], Kim RN, [name] Previous Signature: 12/06/2014 3:53:38 PM Version By: Ruthine Dose Entered By: Philip Cool RN, [name], Kim on 12/06/2014 16:00:19 Philip [name] ([id number]) -------------------------------------------------------------------------------- Lower Extremity Assessment Details Patient Name: Philip [name], Philip [name]. Date of Service: 12/06/2014 3:15 PM Medical Record Number: [id number] Patient Account Number: [id number] Date of Birth/Sex: 1918-12-30 (81 y.o. Male) Treating RN: Philip [name] Primary Care Physician: Philip [name] Other Clinician: Referring Physician: Hortencia [name] Treating Physician/Extender: Philip [name] in Treatment: 19 Vascular Assessment Pulses: Posterior Tibial Dorsalis Pedis Palpable: [Left:Yes] [Right:Yes] Extremity colors, hair growth, and conditions: Extremity Color: [Left:Mottled] [Right:Mottled] Hair Growth on Extremity: [Left:No] [Right:No] Temperature of Extremity: [Left:Cool] [Right:Cool] Capillary Refill: [Left:< 3 seconds] [Right:< 3 seconds] Electronic Signature(s) Signed: 12/06/2014 5:07:03 PM By: Philip [name] Entered By: Philip Cool, RN, [name], Kim on 12/06/2014 15:28:56 Philip [name] ([id number]) -------------------------------------------------------------------------------- Multi Wound Chart Details Patient Name: Philip [name], Philip [name]. Date of Service:  12/06/2014 3:15 PM Medical Record Number: [id number] Patient Account Number: [id number] Date of Birth/Sex: 12/28/1918 (81 y.o. 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20%] [5:92.50%] Classification: [3:Full Thickness Without Exposed Support Structures] [4:Full Thickness Without Exposed Support Structures] [5:Partial Thickness] Exudate Amount: [3:Small] [4:Small] [5:Small] Exudate Type: [3:Serosanguineous] [4:Serosanguineous] [5:Serous] Exudate Color: [3:red, brown] [4:red, brown] [5:amber] Wound Margin: [3:Distinct, outline attached] [4:Distinct, outline attached] [5:Distinct, outline attached] Granulation Amount: [3:Small (1-33%)] [4:Small (1-33%)] [5:Large (67-100%)] Granulation Quality: [3:Pink] [4:Pink] [5:Red] Necrotic  Amount: [3:Small (1-33%)] [4:Medium (34-66%)] [5:None Present (0%)] Exposed Structures: [3:Fascia: No Fat: No Tendon: No Muscle: No Joint: No] [4:Fat: Yes Fascia: No Tendon: No Muscle: No] [5:Fascia: No Fat: No Tendon: No Muscle: No Joint: No] Bone: No Joint: No Bone: No Limited to Skin Bone: No Limited to Skin Breakdown Breakdown Epithelialization: None None Large (67-100%) Periwound Skin Texture: Edema: Yes Edema: Yes Edema: No Excoriation: No Excoriation: No Excoriation: No Induration: No Induration: No Induration: No Callus: No Callus: No Callus: No Crepitus: No Crepitus: No Crepitus: No Fluctuance: No Fluctuance: No Fluctuance: No Friable: No Friable: No Friable: No Rash: No Rash: No Rash: No Scarring: No Scarring: No Scarring: No Periwound Skin Moist: Yes Moist: Yes Maceration: No Moisture: Maceration: No Maceration: No Moist: No Dry/Scaly: No Dry/Scaly: No Dry/Scaly: No Periwound Skin Color: Atrophie Blanche: No Atrophie Blanche: No Atrophie Blanche: No Cyanosis: No Cyanosis: No Cyanosis: No Ecchymosis: No Ecchymosis: No Ecchymosis: No Erythema: No Erythema: No Erythema: No Hemosiderin Staining: No Hemosiderin Staining: No Hemosiderin Staining: No Mottled: No Mottled: No Mottled: No Pallor: No Pallor: No Pallor: No Rubor: No Rubor: No Rubor: No Temperature: No Abnormality No Abnormality No Abnormality Tenderness on No Yes No Palpation: Wound Preparation: Ulcer Cleansing: Ulcer Cleansing: Ulcer Cleansing: Rinsed/Irrigated with Rinsed/Irrigated with Rinsed/Irrigated with Saline Saline Saline Topical Anesthetic Topical Anesthetic Topical Anesthetic Applied: Other: lidocaine Applied: Other: lidocaine Applied: None 4% 4% Treatment Notes Electronic Signature(s) Signed: 12/06/2014 5:07:03 PM By: Philip [name] Entered By: Philip Cool, RN, [name], Kim on 12/06/2014 15:34:22 Philip [name], Philip [name]  ([id number]) -------------------------------------------------------------------------------- [facility] Details Patient Name: Philip [name], Philip [name]. Date of Service: 12/06/2014 3:15 PM Medical Record Number: [id number] Patient Account Number: [id number] Date of Birth/Sex: August 02, 1918 (81 y.o. Male) Treating RN: Philip [name] Primary Care Physician: Philip [name] Other Clinician: Referring Physician: Hortencia [name] Treating Physician/Extender: Philip [name] in Treatment: 58 Active Inactive Abuse / Safety / Falls / Self Care Management Nursing Diagnoses: Abuse or neglect; actual or potential Potential for falls Goals: Patient will remain injury free Date Initiated: 05/02/2014 Goal Status: Active Interventions: Assess fall risk on admission and as needed Notes: Necrotic Tissue Nursing Diagnoses: Impaired tissue integrity related to necrotic/devitalized tissue Goals: Necrotic/devitalized tissue will be minimized in the wound bed Date Initiated: 05/02/2014 Goal Status: Active Interventions: Assess patient pain level pre-, during and post procedure and prior to discharge Treatment Activities: Apply topical anesthetic as ordered : 12/06/2014 Notes: Orientation to the Wound Care Program Nursing Diagnoses: Knowledge deficit related to the wound healing center program RAHIM, ASTORGA ([id number]) Goals: Patient/caregiver will verbalize understanding of the [facility]  Program Date Initiated: 05/02/2014 Goal Status: Active Interventions: Provide education on orientation to the wound center Notes: Electronic Signature(s) Signed: 12/06/2014 5:07:03 PM By: Philip [name] Entered By: Philip Cool, RN, [name], Kim on 12/06/2014 15:34:16 Philip [name] ([id number]) -------------------------------------------------------------------------------- Pain Assessment Details Patient Name: Philip [name], Philip [name]. Date of Service: 12/06/2014 3:15 PM Medical Record Number:  [id number] Patient Account Number: [id number] Date of Birth/Sex: 04/06/1919 (81 y.o. Male) Treating RN: Philip [name] Primary Care Physician: Philip [name] Other Clinician: Referring Physician: Hortencia [name] Treating Physician/Extender: Philip [name] in Treatment: 17 Active Problems Location of Pain Severity and Description of Pain Patient Has Paino No Site Locations Pain Management and Medication Current Pain Management: Electronic Signature(s) Signed: 12/06/2014 5:07:03 PM By: Philip [name] Entered By: Philip Cool, RN, [name], Kim on 12/06/2014 15:19:13 Philip [name] ([id number]) -------------------------------------------------------------------------------- Patient/Caregiver Education Details Patient Name: DELLAS, GUARD. Date of Service: 12/06/2014 3:15 PM Medical Record Number: [id number] Patient Account Number: [id number] Date of Birth/Gender: June 06, 1918 (81 y.o. Male) Treating RN: Philip [name] Primary Care Physician: Philip [name] Other Clinician: Referring Physician: Hortencia [name] Treating Physician/Extender: Philip [name] in Treatment: 52 Education Assessment Education Provided To: Patient Education Topics Provided Wound/Skin Impairment: Handouts: Caring for Your Ulcer, Other: wound care as prescribed Electronic Signature(s) Signed: 12/06/2014 5:07:03 PM By: Philip [name] Entered By: Philip Cool, RN, [name], Kim on 12/06/2014 16:00:43 Philip [name] ([id number]) -------------------------------------------------------------------------------- Wound Assessment Details Patient Name: Philip [name], Philip [name]. Date of Service: 12/06/2014 3:15 PM Medical Record Number: [id number] Patient Account Number: [id number] Date of Birth/Sex: Jun 03, 1918 (81 y.o. Male) Treating RN: Philip [name] Primary Care Physician: Philip [name] Other Clinician: Referring Physician: Hortencia [name] Treating Physician/Extender: Philip [name] in Treatment: 31 Wound  Status Wound Number: 3 Primary Malignant Wound Etiology: Wound Location: Right Foot - Dorsal Wound Status: Open Wounding Event: Surgical Injury Comorbid Cataracts, Arrhythmia, Congestive Date Acquired: 07/01/2014 History: Heart Failure Weeks Of Treatment: 21 Clustered Wound: No Wound Measurements Length: (cm) 0.3 Width: (cm) 0.3 Depth: (cm) 0.1 Area: (cm) 0.071 Volume: (cm) 0.007 % Reduction in Area: 96.2% % Reduction in Volume: 99.1% Epithelialization: None Wound Description Full Thickness Without Exposed Classification: Support Structures Wound Margin: Distinct, outline attached Exudate Small Amount: Exudate Type: Serosanguineous Exudate Color: red, brown Foul Odor After Cleansing: No Wound Bed Granulation Amount: Small (1-33%) Exposed Structure Granulation Quality: Pink Fascia Exposed: No Necrotic Amount: Small (1-33%) Fat Layer Exposed: No Necrotic Quality: Adherent Slough Tendon Exposed: No Muscle Exposed: No Joint Exposed: No Bone Exposed: No Limited to Skin Breakdown Periwound Skin Texture Texture Color No Abnormalities Noted: No No Abnormalities Noted: No Callus: No Atrophie Blanche: No Crepitus: No Cyanosis: No Philip [name], Philip [name] ([id number]) Excoriation: No Ecchymosis: No Fluctuance: No Erythema: No Friable: No Hemosiderin Staining: No Induration: No Mottled: No Localized Edema: Yes Pallor: No Rash: No Rubor: No Scarring: No Temperature / Pain Moisture Temperature: No Abnormality No Abnormalities Noted: No Dry / Scaly: No Maceration: No Moist: Yes Wound Preparation Ulcer Cleansing: Rinsed/Irrigated with Saline Topical Anesthetic Applied: Other: lidocaine 4%, Treatment Notes Wound #3 (Right, Dorsal Foot) 1. Cleansed with: Clean wound with Normal Saline 4. Dressing Applied: Prisma Ag Mepitel 5. Secondary Dressing Applied Bordered Foam Dressing 7. Secured with Financial risk analyst) Signed: 12/06/2014 5:07:03 PM By:  Philip [name] Entered By: Philip Cool, RN, [name], Kim on 12/06/2014 15:30:02 Philip [name], Philip [name] ([id number]) -------------------------------------------------------------------------------- Wound Assessment Details Patient Name: Philip [name], Philip [name]. Date of Service: 12/06/2014 3:15 PM Medical  Record Number: [id number] Patient Account Number: [id number] Date of Birth/Sex: 1918-06-22 (81 y.o. Male) Treating RN: Philip [name] Primary Care Physician: Philip [name] Other Clinician: Referring Physician: Hortencia [name] Treating Physician/Extender: Philip [name] in Treatment: 31 Wound Status Wound Number: 4 Primary Malignant Wound Etiology: Wound Location: Left Malleolus - Medial Wound Status: Open Wounding Event: Other Lesion Comorbid Cataracts, Arrhythmia, Congestive Date Acquired: 09/20/2014 History: Heart Failure Weeks Of Treatment: 11 Clustered Wound: No Wound Measurements Length: (cm) 2 Width: (cm) 1.7 Depth: (cm) 0.3 Area: (cm) 2.67 Volume: (cm) 0.801 % Reduction in Area: -1600.6% % Reduction in Volume: -4906.2% Epithelialization: None Wound Description Full Thickness Without Exposed Classification: Support Structures Wound Margin: Distinct, outline attached Exudate Small Amount: Exudate Type: Serosanguineous Exudate Color: red, brown Foul Odor After Cleansing: No Wound Bed Granulation Amount: Small (1-33%) Exposed Structure Granulation Quality: Pink Fascia Exposed: No Necrotic Amount: Medium (34-66%) Fat Layer Exposed: Yes Necrotic Quality: Adherent Slough Tendon Exposed: No Muscle Exposed: No Joint Exposed: No Bone Exposed: No Periwound Skin Texture Texture Color No Abnormalities Noted: No No Abnormalities Noted: No Callus: No Atrophie Blanche: No Crepitus: No Cyanosis: No Excoriation: No Ecchymosis: No NATHANEAL, SOMMERS ([id number]) Fluctuance: No Erythema: No Friable: No Hemosiderin Staining: No Induration: No Mottled: No Localized Edema:  Yes Pallor: No Rash: No Rubor: No Scarring: No Temperature / Pain Moisture Temperature: No Abnormality No Abnormalities Noted: No Tenderness on Palpation: Yes Dry / Scaly: No Maceration: No Moist: Yes Wound Preparation Ulcer Cleansing: Rinsed/Irrigated with Saline Topical Anesthetic Applied: Other: lidocaine 4%, Treatment Notes Wound #4 (Left, Medial Malleolus) 1. Cleansed with: Clean wound with Normal Saline 2. Anesthetic Topical Lidocaine 4% cream to wound bed prior to debridement 4. Dressing Applied: Hydrafera Blue 5. Secondary Dressing Applied Bordered Foam Dressing 7. Secured with Financial risk analyst) Signed: 12/06/2014 5:07:03 PM By: Philip [name] Entered By: Philip Cool, RN, [name], Kim on 12/06/2014 15:30:27 ZAEDYN, COVIN ([id number]) -------------------------------------------------------------------------------- Wound Assessment Details Patient Name: SHAYN, MADOLE. Date of Service: 12/06/2014 3:15 PM Medical Record Number: [id number] Patient Account Number: [id number] Date of Birth/Sex: November 11, 1918 (81 y.o. Male) Treating RN: Philip [name] Primary Care Physician: Philip [name] Other Clinician: Referring Physician: Hortencia [name] Treating Physician/Extender: Philip [name] in Treatment: 31 Wound Status Wound Number: 5 Primary Skin Tear Etiology: Wound Location: Right Forearm - Lateral Wound Status: Open Wounding Event: Shear/Friction Comorbid Cataracts, Arrhythmia, Congestive Date Acquired: 10/01/2014 History: Heart Failure Weeks Of Treatment: 9 Clustered Wound: No Wound Measurements Length: (cm) 2 Width: (cm) 0.3 Depth: (cm) 0.1 Area: (cm) 0.471 Volume: (cm) 0.047 % Reduction in Area: 92.5% % Reduction in Volume: 92.5% Epithelialization: Large (67-100%) Wound Description Classification: Partial Thickness Wound Margin: Distinct, outline attached Exudate Amount: Small Exudate Type: Serous Exudate Color: amber Foul Odor  After Cleansing: No Wound Bed Granulation Amount: Large (67-100%) Exposed Structure Granulation Quality: Red Fascia Exposed: No Necrotic Amount: None Present (0%) Fat Layer Exposed: No Tendon Exposed: No Muscle Exposed: No Joint Exposed: No Bone Exposed: No Limited to Skin Breakdown Periwound Skin Texture Texture Color No Abnormalities Noted: No No Abnormalities Noted: No Callus: No Atrophie Blanche: No Crepitus: No Cyanosis: No Excoriation: No Ecchymosis: No Fluctuance: No Erythema: No DERREL, MOORE ([id number]) Friable: No Hemosiderin Staining: No Induration: No Mottled: No Localized Edema: No Pallor: No Rash: No Rubor: No Scarring: No Temperature / Pain Moisture Temperature: No Abnormality No Abnormalities Noted: No Dry / Scaly: No Maceration: No Moist: No Wound Preparation Ulcer Cleansing: Rinsed/Irrigated with Saline Topical Anesthetic Applied:  None Treatment Notes Wound #5 (Right, Lateral Forearm) 1. Cleansed with: Clean wound with Normal Saline 2. Anesthetic Topical Lidocaine 4% cream to wound bed prior to debridement 4. Dressing Applied: Mepitel Other dressing (specify in notes) 5. Secondary Dressing Applied Bordered Foam Dressing Notes drawtex Electronic Signature(s) Signed: 12/06/2014 5:07:03 PM By: Philip [name] Entered By: Philip Cool, RN, [name], Kim on 12/06/2014 15:31:02 Philip [name] ([id number]) -------------------------------------------------------------------------------- [place] Details Patient Name: VERLE, WHEELING. Date of Service: 12/06/2014 3:15 PM Medical Record Number: [id number] Patient Account Number: [id number] Date of Birth/Sex: February 08, 1919 (81 y.o. Male) Treating RN: Philip [name] Primary Care Physician: Philip [name] Other Clinician: Referring Physician: Hortencia [name] Treating Physician/Extender: Philip [name] in Treatment: 31 Vital Signs Time Taken: 15:19 Temperature (F): 97.7 Height (in): 69 Pulse  (bpm): 67 Weight (lbs): 179 Respiratory Rate (breaths/min): 18 Body Mass Index (BMI): 26.4 Blood Pressure (mmHg): 112/48 Reference Range: 80 - 120 mg / dl Electronic Signature(s) Signed: 12/06/2014 5:07:03 PM By: Philip [name] Entered By: Philip Cool, RN, [name], Kim on 12/06/2014 15:19:35

## 2014-12-07 NOTE — Progress Notes (Signed)
Philip, [name] ([id number]) Visit Report for 12/06/2014 Chief Complaint Document Details Patient Name: Philip [name], Philip [name]. Date of Service: 12/06/2014 3:15 PM Medical Record Number: [id number] Patient Account Number: [id number] Date of Birth/Sex: 1918-11-26 (81 y.o. Male) Treating RN: Philip [name] Primary Care Physician: Philip [name] Other Clinician: Referring Physician: Hortencia [name] Treating Physician/Extender: Philip [name] in Treatment: 91 Information Obtained from: Patient Chief Complaint R foot ulcer. L forearm ulcer. 07/19/2014 -- about 2 weeks ago he had a surgical procedure done by dermatologist in [place] and has an open surgical wound on the dorsum of the right foot. Electronic Signature(s) Signed: 12/06/2014 4:25:01 PM By: Philip Fudge MD, FACS Entered By: Philip [name] on 12/06/2014 16:00:55 Philip [name] ([id number]) -------------------------------------------------------------------------------- HPI Details Patient Name: Philip [name], Philip [name]. Date of Service: 12/06/2014 3:15 PM Medical Record Number: [id number] Patient Account Number: [id number] Date of Birth/Sex: [birth year] (81 y.o. Male) Treating RN: Philip [name] Primary Care Physician: Philip [name] Other Clinician: Referring Physician: Hortencia [name] Treating Physician/Extender: Philip [name] in Treatment: 31 History of Present Illness Location: right leg Duration: Dec 2015 Modifying Factors: history of an injury to the right leg with resulting hematoma and thrombophlebitis and later an ulcer of posterior leg Associated Signs and Symptoms: marked lymphedema of the right leg. He is already on Eloquis. HPI Description: 06/14/14 -- He returns for followup today. He denies any fevers. no fresh issues and his daughter says he's been doing fine. 06/21/14 -- after he sustained a fall this week earlier he applied a bandage over this himself and did not seek any medical attention. he did however manage to control  the bleeding and had a dressing in place the next morning when his son to the visit. In this dressing was removed there was further damaged skin. His right leg has been doing fine otherwise. 07/12/14 --Very pleasant 81 year old with past medical history significant for congestive heart failure (EF 15%), peripheral vascular disease, and chronic kidney disease. He was hospitalized at [facility] in December 2015 for congestive heart failure. He says that he fell during his hospital course and developed a hematoma over his right calf. He was also diagnosed with a right lower extremity DVT for which he takes Eliquis. The hematoma subsequently turned into an ulceration around Christmas, which has healed. He subsequently developed an ulcer on his right dorsal foot and a traumatic left forearm ulcer. Per his report, he underwent biopsy of the right dorsal foot ulceration which demonstrated a skin cancer. His PCP and dermatologist office are both closed today. I reviewed his records in [place] but find no report of biopsy or pathology. He s without complaints today. No significant pain. No fever or chills. Minimal drainage. 07/19/2014 - the patient and his son tell me that about 2 weeks ago the dermatologist did a skin biopsy and this was a large area on the dorsum of his right foot which was left open and no dressing instructions were recommended. Since then he has been called and told that it is a cancer and the need to do a further procedure but that will not happen until about 2 weeks from now. In the meanwhile the patient has not been taking care of his right foot. The left forearm where he had an abrasion and laceration is doing very well. 07/26/2014 -- Reports from 07/08/2014 from the dermatology group reviewed. A excision was done of a lesion located on the dorsum of the right foot and this was 1.7 cm in diameter which was a shave  biopsy performed. The wound was left open after appropriate cauterization and  the patient was given this dressing instructions. 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 08/02/2014 -- all the original problems he came with have completely resolved. He now has a surgical wound on his right foot dorsum where a skin cancer was excised. He goes to Philip [name] his dermatologist this Philip, [name] ([id number]) coming Tuesday and will have definite news next Friday. 08/09/2014 he had gone to his dermatologist on Tuesday and she has injected the base of his ulcer with some chemotherapeutic agent. He was supposed to bring some papers with him but forgot to get them and will bring them in next week. Other than that the dermatologist had suggested using Mehdi honey on the wound. 08/16/2014 -- the patient has brought in his notes from the dermatologist and on 08/06/2014 he received a injection of 5 FU, 500 mg grams into the lesion. the pathology report was also sent and it was a squamous cell carcinoma well-differentiated and edges were involved. They wanted him to use many honey for the wound dressing changes to be done 3 times a week. 08/30/2014 -- he has finished his second injection of 5-FU and has the next one in 2 weeks' time. He is doing fine otherwise. 09/13/2014 - No new complaints. No significant pain. No fever or chills. Minimal drainage. Still receiving 5-FU injections. 09/20/2014 -- He was seen by the dermatologist on 09/10/2014 and Dr. Phillip Heal injected his foot both the right on the dorsum and left near the medial malleolus with 5-FU. The next dose of 5-FU is to be given after 3 months. The patient says he now has a spot on the left medial malleolus where he was injected with 5-FU. 09/27/2014 -- the area on the left ankle where he was injected with 5-FU is now a full-blown ulcer. He also has mild pain in both ankle areas. 10/04/2014 - large had a bit of a fall and injured his right  arm last evening and has had a laceration with no evidence of any foreign body in the right forearm. Electronic Signature(s) Signed: 12/06/2014 4:25:01 PM By: Philip Fudge MD, FACS Entered By: Philip [name] on 12/06/2014 16:01:00 Philip [name] ([id number]) -------------------------------------------------------------------------------- Physical Exam Details Patient Name: KEENA, HEESCH. Date of Service: 12/06/2014 3:15 PM Medical Record Number: [id number] Patient Account Number: [id number] Date of Birth/Sex: 1918/12/02 (81 y.o. Male) Treating RN: Philip [name] Primary Care Physician: Philip [name] Other Clinician: Referring Physician: Hortencia [name] Treating Physician/Extender: Philip [name] in Treatment: 31 Constitutional . Pulse regular. Respirations normal and unlabored. Afebrile. . Eyes Nonicteric. Reactive to light. Ears, Nose, Mouth, and Throat Lips, teeth, and gums WNL.Marland Kitchen Moist mucosa without lesions . Neck supple and nontender. No palpable supraclavicular or cervical adenopathy. Normal sized without goiter. Respiratory WNL. No retractions.. Cardiovascular Pedal Pulses WNL. No clubbing, cyanosis or edema. Lymphatic No adneopathy. No adenopathy. No adenopathy. Musculoskeletal Adexa without tenderness or enlargement.. Digits and nails w/o clubbing, cyanosis, infection, petechiae, ischemia, or inflammatory conditions.. Integumentary (Hair, Skin) No suspicious lesions. No crepitus or fluctuance. No peri-wound warmth or erythema. No masses.Marland Kitchen Psychiatric Judgement and insight Intact.. No evidence of depression, anxiety, or agitation.. Notes Tis right arm is completely healed and there is no maceration this week. The dorsum of the right foot has also healed and there is minimal surrounding moisture. The medial part of the left ankle has minimal granulation tissue and  this deep fascia which is visible. Electronic Signature(s) Signed: 12/06/2014 4:25:01 PM By: Philip Fudge MD, FACS Entered By: Philip [name] on 12/06/2014 16:02:07 Philip [name] ([id number]) -------------------------------------------------------------------------------- Physician Orders Details Patient Name: Philip [name], Philip [name]. Date of Service: 12/06/2014 3:15 PM Medical Record Number: [id number] Patient Account Number: [id number] Date of Birth/Sex: 02-17-1919 (81 y.o. Male) Treating RN: Philip [name] Primary Care Physician: Philip [name] Other Clinician: Referring Physician: Hortencia [name] Treating Physician/Extender: Philip [name] in Treatment: 56 Verbal / Phone Orders: Yes Clinician: Cornell [name] Read Back and Verified: Yes Diagnosis Coding Wound Cleansing Wound #3 Right,Dorsal Foot o Clean wound with wound cleanser. o Cleanse wound with mild soap and water o May Shower, gently pat wound dry prior to applying new dressing. o May shower with protection. Wound #4 Left,Medial Malleolus o Clean wound with wound cleanser. o Cleanse wound with mild soap and water o May Shower, gently pat wound dry prior to applying new dressing. o May shower with protection. Wound #5 Right,Lateral Forearm o Clean wound with wound cleanser. o Cleanse wound with mild soap and water o May Shower, gently pat wound dry prior to applying new dressing. o May shower with protection. Skin Barriers/Peri-Wound Care Wound #3 Right,Dorsal Foot o Skin Prep Wound #4 Left,Medial Malleolus o Skin Prep Wound #5 Right,Lateral Forearm o Skin Prep Primary Wound Dressing Wound #3 Right,Dorsal Foot o Prisma Ag o Mepitel One Wound #4 Left,Medial Malleolus o [address] [place]. ([id number]) Wound #5 Right,Lateral Forearm o Drawtex o Mepitel One Secondary Dressing Wound #3 Right,Dorsal Foot o Boardered Foam Dressing Wound #4 Left,Medial Malleolus o Boardered Foam Dressing Wound #5 Right,Lateral Forearm o Boardered Foam Dressing Dressing Change  Frequency Wound #3 Right,Dorsal Foot o Change dressing every week Wound #4 Left,Medial Malleolus o Change dressing every other day. Wound #5 Right,Lateral Forearm o Change dressing every week Follow-up Appointments Wound #3 Right,Dorsal Foot o Return Appointment in 1 week. Wound #4 Left,Medial Malleolus o Return Appointment in 1 week. Wound #5 Right,Lateral Forearm o Return Appointment in 1 week. Edema Control Wound #3 Right,Dorsal Foot o Tubigrip Wound #4 Left,Medial Malleolus o Tubigrip Home Health Wound #3 [place] Visits - [place] Nurse may visit PRN to address patientos wound care needs. Philip [name], Philip [name] ([id number]) o FACE TO FACE ENCOUNTER: MEDICARE and MEDICAID PATIENTS: I certify that this patient is under my care and that I had a face-to-face encounter that meets the physician face-to-face encounter requirements with this patient on this date. The encounter with the patient was in whole or in part for the following MEDICAL CONDITION: (primary reason for [place]) MEDICAL NECESSITY: I certify, that based on my findings, NURSING services are a medically necessary home health service. HOME BOUND STATUS: I certify that my clinical findings support that this patient is homebound (i.e., Due to illness or injury, pt requires aid of supportive devices such as crutches, cane, wheelchairs, walkers, the use of special transportation or the assistance of another person to leave their place of residence. There is a normal inability to leave the home and doing so requires considerable and taxing effort. Other absences are for medical reasons / religious services and are infrequent or of short duration when for other reasons). o If current dressing causes regression in wound condition, may D/C ordered dressing product/s and apply Normal Saline Moist Dressing daily until next [place] / Other  MD appointment. [place] of regression in wound condition at  [phone]. o Please direct any NON-WOUND related issues/requests for orders to patient's Primary Care Physician Wound #4 [place] Visits - [place] Nurse may visit PRN to address patientos wound care needs. o FACE TO FACE ENCOUNTER: MEDICARE and MEDICAID PATIENTS: I certify that this patient is under my care and that I had a face-to-face encounter that meets the physician face-to-face encounter requirements with this patient on this date. The encounter with the patient was in whole or in part for the following MEDICAL CONDITION: (primary reason for [place]) MEDICAL NECESSITY: I certify, that based on my findings, NURSING services are a medically necessary home health service. HOME BOUND STATUS: I certify that my clinical findings support that this patient is homebound (i.e., Due to illness or injury, pt requires aid of supportive devices such as crutches, cane, wheelchairs, walkers, the use of special transportation or the assistance of another person to leave their place of residence. There is a normal inability to leave the home and doing so requires considerable and taxing effort. Other absences are for medical reasons / religious services and are infrequent or of short duration when for other reasons). o If current dressing causes regression in wound condition, may D/C ordered dressing product/s and apply Normal Saline Moist Dressing daily until next [place] / Other MD appointment. [place] of regression in wound condition at [phone]. o Please direct any NON-WOUND related issues/requests for orders to patient's Primary Care Physician Wound #5 Right,Lateral Forearm o [place] Visits - [place] Nurse may visit PRN to address patientos wound care needs. o FACE TO FACE  ENCOUNTER: MEDICARE and MEDICAID PATIENTS: I certify that this patient is under my care and that I had a face-to-face encounter that meets the physician face-to-face encounter requirements with this patient on this date. The encounter with the patient was in whole or in part for the following MEDICAL CONDITION: (primary reason for [place]) MEDICAL NECESSITY: I certify, that based on my findings, NURSING services are a medically necessary home health service. HOME BOUND STATUS: I certify that my clinical findings support that this patient is homebound (i.e., Due to illness or injury, pt requires aid of supportive devices such as crutches, cane, wheelchairs, walkers, the use of special Philip [name], Philip [name]. ([id number]) transportation or the assistance of another person to leave their place of residence. There is a normal inability to leave the home and doing so requires considerable and taxing effort. Other absences are for medical reasons / religious services and are infrequent or of short duration when for other reasons). o If current dressing causes regression in wound condition, may D/C ordered dressing product/s and apply Normal Saline Moist Dressing daily until next [place] / Other MD appointment. [place] of regression in wound condition at [phone]. o Please direct any NON-WOUND related issues/requests for orders to patient's Primary Care Physician Electronic Signature(s) Signed: 12/06/2014 4:25:01 PM By: Philip Fudge MD, FACS Signed: 12/06/2014 5:07:03 PM By: Gretta Cool RN, BSN, Kim RN, BSN Entered By: Gretta Cool, RN, BSN, Kim on 12/06/2014 15:57:26 Philip [name] ([id number]) -------------------------------------------------------------------------------- Problem List Details Patient Name: Philip [name], Philip [name]. Date of Service: 12/06/2014 3:15 PM Medical Record Number: [id number] Patient Account Number: [id number] Date of Birth/Sex: [birth year] (81 y.o.  Male) Treating RN: Philip [name] Primary Care Physician: Philip [name] Other Clinician: Referring Physician: Hortencia [name] Treating Physician/Extender: Philip [name] in Treatment: 18 Active Problems ICD-10 Encounter Code  Description Active Date Diagnosis I70.232 Atherosclerosis of native arteries of right leg with 06/21/2014 Yes ulceration of calf I82.401 Acute embolism and thrombosis of unspecified deep veins 06/21/2014 Yes of right lower extremity S91.301A Unspecified open wound, right foot, initial encounter 07/19/2014 Yes Z92.21 Personal history of antineoplastic chemotherapy 08/23/2014 Yes L97.522 Non-pressure chronic ulcer of other part of left foot with fat 09/20/2014 Yes layer exposed S41.111A Laceration without foreign body of right upper arm, initial 10/04/2014 Yes encounter Inactive Problems Resolved Problems ICD-10 Code Description Active Date Resolved Date L97.212 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 Date of Service: 12/06/2014 3:15 PM Medical Record Number: [id number] Patient Account Number: [id number] Date of Birth/Sex: 10-May-1918 (81 y.o. Male) Treating RN: Philip [name] Primary Care Physician: Philip [name] Other Clinician: Referring Physician: Hortencia [name] Treating Physician/Extender: Philip [name] in Treatment: 65 Subjective Chief Complaint Information obtained from Patient R foot ulcer. L forearm ulcer. 07/19/2014 -- about 2 weeks ago he had a surgical procedure done by dermatologist in [place] and  has an open surgical wound on the dorsum of the right foot. History of Present Illness (HPI) The following HPI elements were documented for the patient's wound: Location: right leg Duration: Dec 2015 Modifying Factors: history of an injury to the right leg with resulting hematoma and thrombophlebitis and later an ulcer of posterior leg Associated Signs and Symptoms: marked lymphedema of the right leg. He is already on Eloquis. 06/14/14 -- He returns for followup today. He denies any fevers. no fresh issues and his daughter says he's been doing fine. 06/21/14 -- after he sustained a fall this week earlier he applied a bandage over this himself and did not seek any medical attention. he did however manage to control the bleeding and had a dressing in place the next morning when his son to the visit. In this dressing was removed there was further damaged skin. His right leg has been doing fine otherwise. 07/12/14 --Very pleasant 81 year old with past medical history significant for congestive heart failure (EF 15%), peripheral vascular disease, and chronic kidney disease. He was hospitalized at [facility] in December 2015 for congestive heart failure. He says that he fell during his hospital course and developed a hematoma over his right calf. He was also diagnosed with a right lower extremity DVT for which he takes Eliquis. The hematoma subsequently turned into an ulceration around Christmas, which has healed. He subsequently developed an ulcer on his right dorsal foot and a traumatic left forearm ulcer. Per his report, he underwent biopsy of the right dorsal foot ulceration which demonstrated a skin cancer. His PCP and dermatologist office are both closed today. I reviewed his records in [place] but find no report of biopsy or pathology. He s without complaints today. No significant pain. No fever or chills. Minimal drainage. 07/19/2014 - the patient and his son tell me that about 2 weeks ago the  dermatologist did a skin biopsy and this was a large area on the dorsum of his right foot which was left open and no dressing instructions were recommended. Since then he has been called and told that it is a cancer and the need to do a further procedure but that will not  happen until about 2 weeks from now. In the meanwhile the patient has not been taking care of his right foot. The left forearm where he had an abrasion and laceration is doing very well. Philip [name], Philip [name] ([id number]) 07/26/2014 -- Reports from 07/08/2014 from the dermatology group reviewed. A excision was done of a lesion located on the dorsum of the right foot and this was 1.7 cm in diameter which was a shave biopsy performed. The wound was left open after appropriate cauterization and the patient was given this dressing instructions. 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 08/02/2014 -- all the original problems he came with have completely resolved. He now has a surgical wound on his right foot dorsum where a skin cancer was excised. He goes to Philip [name] his dermatologist this coming Tuesday and will have definite news next Friday. 08/09/2014 he had gone to his dermatologist on Tuesday and she has injected the base of his ulcer with some chemotherapeutic agent. He was supposed to bring some papers with him but forgot to get them and will bring them in next week. Other than that the dermatologist had suggested using Mehdi honey on the wound. 08/16/2014 -- the patient has brought in his notes from the dermatologist and on 08/06/2014 he received a injection of 5 FU, 500 mg grams into the lesion. the pathology report was also sent and it was a squamous cell carcinoma well-differentiated and edges were involved. They wanted him to use many honey for the wound dressing changes to be done 3 times a week. 08/30/2014 -- he has finished his second  injection of 5-FU and has the next one in 2 weeks' time. He is doing fine otherwise. 09/13/2014 - No new complaints. No significant pain. No fever or chills. Minimal drainage. Still receiving 5-FU injections. 09/20/2014 -- He was seen by the dermatologist on 09/10/2014 and Dr. Phillip Heal injected his foot both the right on the dorsum and left near the medial malleolus with 5-FU. The next dose of 5-FU is to be given after 3 months. The patient says he now has a spot on the left medial malleolus where he was injected with 5-FU. 09/27/2014 -- the area on the left ankle where he was injected with 5-FU is now a full-blown ulcer. He also has mild pain in both ankle areas. 10/04/2014 - large had a bit of a fall and injured his right arm last evening and has had a laceration with no evidence of any foreign body in the right forearm. Objective Constitutional Pulse regular. Respirations normal and unlabored. Afebrile. Vitals Time Taken: 3:19 PM, Height: 69 in, Weight: 179 lbs, BMI: 26.4, Temperature: 97.7 F, Pulse: 67 bpm, Respiratory Rate: 18 breaths/min, Blood Pressure: 112/48 mmHg. Philip [name], Philip [name] ([id number]) Eyes Nonicteric. Reactive to light. Ears, Nose, Mouth, and Throat Lips, teeth, and gums WNL.Marland Kitchen Moist mucosa without lesions . Neck supple and nontender. No palpable supraclavicular or cervical adenopathy. Normal sized without goiter. Respiratory WNL. No retractions.. Cardiovascular Pedal Pulses WNL. No clubbing, cyanosis or edema. Lymphatic No adneopathy. No adenopathy. No adenopathy. Musculoskeletal Adexa without tenderness or enlargement.. Digits and nails w/o clubbing, cyanosis, infection, petechiae, ischemia, or inflammatory conditions.Marland Kitchen Psychiatric Judgement and insight Intact.. No evidence of depression, anxiety, or agitation.. General Notes: Tis right arm is completely healed and there is no maceration this week. The dorsum of the right foot has also healed and there is minimal  surrounding moisture. The medial  part of the left ankle has minimal granulation tissue and this deep fascia which is visible. Integumentary (Hair, Skin) No suspicious lesions. No crepitus or fluctuance. No peri-wound warmth or erythema. No masses.. Wound #3 status is Open. Original cause of wound was Surgical Injury. The wound is located on the Right,Dorsal Foot. The wound measures 0.3cm length x 0.3cm width x 0.1cm depth; 0.071cm^2 area and 0.007cm^3 volume. The wound is limited to skin breakdown. There is a small amount of serosanguineous drainage noted. The wound margin is distinct with the outline attached to the wound base. There is small (1-33%) pink granulation within the wound bed. There is a small (1-33%) amount of necrotic tissue within the wound bed including Adherent Slough. The periwound skin appearance exhibited: Localized Edema, Moist. The periwound skin appearance did not exhibit: Callus, Crepitus, Excoriation, Fluctuance, Friable, Induration, Rash, Scarring, Dry/Scaly, Maceration, Atrophie Blanche, Cyanosis, Ecchymosis, Hemosiderin Staining, Mottled, Pallor, Rubor, Erythema. Periwound temperature was noted as No Abnormality. Wound #4 status is Open. Original cause of wound was Other Lesion. The wound is located on the Left,Medial Malleolus. The wound measures 2cm length x 1.7cm width x 0.3cm depth; 2.67cm^2 area and 0.801cm^3 volume. There is fat exposed. There is a small amount of serosanguineous drainage noted. The wound margin is distinct with the outline attached to the wound base. There is small (1-33%) pink granulation within the wound bed. There is a medium (34-66%) amount of necrotic tissue within the wound bed including Adherent Slough. The periwound skin appearance exhibited: Localized Edema, Moist. The periwound skin appearance did not exhibit: Callus, Crepitus, Excoriation, Fluctuance, Friable, Induration, Rash, Scarring, Dry/Scaly, Maceration, Atrophie Blanche,  Cyanosis, Ecchymosis, Hemosiderin Staining, Philip [name], Philip [name]. ([id number]) Mottled, Pallor, Rubor, Erythema. Periwound temperature was noted as No Abnormality. The periwound has tenderness on palpation. Wound #5 status is Open. Original cause of wound was Shear/Friction. The wound is located on the Right,Lateral Forearm. The wound measures 2cm length x 0.3cm width x 0.1cm depth; 0.471cm^2 area and 0.047cm^3 volume. The wound is limited to skin breakdown. There is a small amount of serous drainage noted. The wound margin is distinct with the outline attached to the wound base. There is large (67-100%) red granulation within the wound bed. There is no necrotic tissue within the wound bed. The periwound skin appearance did not exhibit: Callus, Crepitus, Excoriation, Fluctuance, Friable, Induration, Localized Edema, Rash, Scarring, Dry/Scaly, Maceration, Moist, Atrophie Blanche, Cyanosis, Ecchymosis, Hemosiderin Staining, Mottled, Pallor, Rubor, Erythema. Periwound temperature was noted as No Abnormality. 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 On his left foot we will use Hydrofera Blue and inappropriate bordered foam. He will come back and Philip [name] me next week. Plan Wound Cleansing: Wound #3 Right,Dorsal Foot: Clean wound with wound cleanser. Cleanse wound with mild soap and water May Shower, gently pat wound dry prior to applying new  dressing. May shower with protection. Wound #4 Left,Medial Malleolus: Clean wound with wound cleanser. Cleanse wound with mild soap and water May Shower, gently pat wound dry prior to  applying new dressing. Philip [name], Philip [name] ([id number]) May shower with protection. Wound #5 Right,Lateral Forearm: Clean wound with wound cleanser. Cleanse wound with mild soap and water May Shower, gently pat wound dry prior to applying new dressing. May shower with protection. Skin Barriers/Peri-Wound Care: Wound #3 Right,Dorsal Foot: Skin Prep Wound #4 Left,Medial Malleolus: Skin Prep Wound #5 Right,Lateral Forearm: Skin Prep Primary Wound Dressing: Wound #3 Right,Dorsal Foot: Prisma Ag Mepitel One Wound #4 Left,Medial Malleolus: Hydrafera Blue Wound #5 Right,Lateral Forearm: Drawtex Mepitel One Secondary Dressing: Wound #3 Right,Dorsal Foot: Boardered Foam Dressing Wound #4 Left,Medial Malleolus: Boardered Foam Dressing Wound #5 Right,Lateral Forearm: Boardered Foam Dressing Dressing Change Frequency: Wound #3 Right,Dorsal Foot: Change dressing every week Wound #4 Left,Medial Malleolus: Change dressing every other day. Wound #5 Right,Lateral Forearm: Change dressing every week Follow-up Appointments: Wound #3 Right,Dorsal Foot: Return Appointment in 1 week. Wound #4 Left,Medial Malleolus: Return Appointment in 1 week. Wound #5 Right,Lateral Forearm: Return Appointment in 1 week. Edema Control: Wound #3 Right,Dorsal Foot: Tubigrip Wound #4 Left,Medial Malleolus: Tubigrip Home Health: Wound #3 Right,Dorsal Foot: Continue Home Health Visits - [facility] Philip [name], Philip [name] ([id number]) [place] Nurse may visit PRN to address patient s wound care needs. FACE TO FACE ENCOUNTER: MEDICARE and MEDICAID PATIENTS: I certify that this patient is under my care and that I had a face-to-face encounter that meets the physician face-to-face encounter requirements with this patient on this  date. The encounter with the patient was in whole or in part for the following MEDICAL CONDITION: (primary reason for [place]) MEDICAL NECESSITY: I certify, that based on my findings, NURSING services are a medically necessary home health service. HOME BOUND STATUS: I certify that my clinical findings support that this patient is homebound (i.e., Due to illness or injury, pt requires aid of supportive devices such as crutches, cane, wheelchairs, walkers, the use of special transportation or the assistance of another person to leave their place of residence. There is a normal inability to leave the home and doing so requires considerable and taxing effort. Other absences are for medical reasons / religious services and are infrequent or of short duration when for other reasons). If current dressing causes regression in wound condition, may D/C ordered dressing product/s and apply Normal Saline Moist Dressing daily until next [place] / Other MD appointment. [place] of regression in wound condition at [phone]. Please direct any NON-WOUND related issues/requests for orders to patient's Primary Care Physician Wound #4 Left,Medial Malleolus: [place] Visits - [place] Nurse may visit PRN to address patient s wound care needs. FACE TO FACE ENCOUNTER: MEDICARE and MEDICAID PATIENTS: I certify that this patient is under my care and that I had a face-to-face encounter that meets the physician face-to-face encounter requirements with this patient on this date. The encounter with the patient was in whole or in part for the following MEDICAL CONDITION: (primary reason for [place]) MEDICAL NECESSITY: I certify, that based on my findings, NURSING services are a medically necessary home health service. HOME BOUND STATUS: I certify that my clinical findings support that this patient is homebound (i.e., Due to illness or injury, pt  requires aid of supportive devices such as crutches, cane, wheelchairs, walkers, the use of special transportation or the assistance of another person to leave their place of residence. There is a normal inability to leave the home and doing so requires considerable and taxing effort. Other absences are for medical reasons / religious  services and are infrequent or of short duration when for other reasons). If current dressing causes regression in wound condition, may D/C ordered dressing product/s and apply Normal Saline Moist Dressing daily until next [place] / Other MD appointment. [place] of regression in wound condition at [phone]. Please direct any NON-WOUND related issues/requests for orders to patient's Primary Care Physician Wound #5 Right,Lateral Forearm: [place] Nurse may visit PRN to address patient s wound care needs. FACE TO FACE ENCOUNTER: MEDICARE and MEDICAID PATIENTS: I certify that this patient is under my care and that I had a face-to-face encounter that meets the physician face-to-face encounter requirements with this patient on this date. The encounter with the patient was in whole or in part for the following MEDICAL CONDITION: (primary reason for [place]) MEDICAL NECESSITY: I certify, that based on my findings, NURSING services are a medically necessary home health service. HOME BOUND STATUS: I certify that my clinical findings support that this patient is homebound (i.e., Due to illness or injury, pt requires aid of supportive devices such as crutches, cane, wheelchairs, walkers, the use of special transportation or the assistance of another person to leave their place of residence. There is a normal inability to leave the home and doing so requires considerable and taxing effort. Other absences are for medical reasons / religious services and are infrequent or of short duration when  for other reasons). If current dressing causes regression in wound condition, may D/C ordered dressing product/s and apply Normal Saline Moist Dressing daily until next [place] / Other MD appointment. [place] of regression in wound condition at [phone]. Please direct any NON-WOUND related issues/requests for orders to patient's Primary Care Physician Philip [name], [employer]. ([id number]) I have recommended Mepitel and drawtex over his right arm and the same dressing over his right foot. On his left foot we will use Hydrofera Blue and inappropriate bordered foam. He will come back and Philip [name] me next week Electronic Signature(s) Signed: 12/06/2014 4:25:01 PM By: Philip Fudge MD, FACS Entered By: Philip [name] on 12/06/2014 16:03:15 Philip [name] ([id number]) -------------------------------------------------------------------------------- SuperBill Details Patient Name: Philip [name]. Date of Service: 12/06/2014 Medical Record Number: [id number] Patient Account Number: [id number] Date of Birth/Sex: March 30, 1919 (81 y.o. 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 S41.111A Laceration without foreign body of right upper arm, initial encounter Facility Procedures The patient participates with Medicare or their insurance follows the Medicare Facility Guidelines: CPT4 Code Description Modifier Quantity [id number]  [place] VISIT-LEV 4 EST PT 1 Physician Procedures CPT4 Code Description: [id number] - WC PHYS LEVEL 3 - EST PT ICD-10 Description Diagnosis I70.232 Atherosclerosis of native arteries of right leg with ul S91.301A Unspecified open wound, right foot, initial encounter L97.522 Non-pressure chronic ulcer  of other part of left foot w Z92.21 Personal history of antineoplastic chemotherapy Modifier: ceration of ith fat laye Quantity: 1 calf r exposed Electronic Signature(s) Signed: 12/06/2014 4:25:01 PM By: Philip Fudge MD, FACS Entered By:  Philip [name] on 12/06/2014 16:03:36

## 2014-12-13 ENCOUNTER — Encounter: Payer: Medicare Other | Admitting: Surgery

## 2014-12-13 DIAGNOSIS — L97522 Non-pressure chronic ulcer of other part of left foot with fat layer exposed: Secondary | ICD-10-CM | POA: Diagnosis not present

## 2014-12-13 NOTE — Progress Notes (Signed)
DELRICK, DEHART ([id number]) Visit Report for 12/13/2014 Arrival Information Details Patient Name: Philip [name], Philip [name]. Date of Service: 12/13/2014 10:15 AM Medical Record Number: [id number] Patient Account Number: [id number] Date of Birth/Sex: Dec 26, 1918 (81 y.o. Male) Treating RN: Montey Hora Primary Care Physician: Hortencia Pilar Other Clinician: Referring Physician: Hortencia Pilar Treating Physician/Extender: Frann Rider in Treatment: 59 Visit Information History Since Last Visit Added or deleted any medications: No Patient Arrived: Cane Any new allergies or adverse reactions: No Arrival Time: 10:32 Had a fall or experienced change in No Accompanied By: son activities of daily living that may affect Transfer Assistance: None risk of falls: Patient Identification Verified: Yes Signs or symptoms of abuse/neglect since last No Secondary Verification Process Yes visito Completed: Hospitalized since last visit: No Patient Requires Transmission- No Pain Present Now: No Based Precautions: Patient Has Alerts: Yes Patient Alerts: Patient on Blood Thinner No ABIs dt LE blood clots Electronic Signature(s) Signed: 12/13/2014 3:10:47 PM By: Montey Hora Entered By: Montey Hora on 12/13/2014 10:33:17 Philip [name] ([id number]) -------------------------------------------------------------------------------- Clinic Level of Care Assessment Details Patient Name: Philip [name]. Date of Service: 12/13/2014 10:15 AM Medical Record Number: [id number] Patient Account Number: [id number] Date of Birth/Sex: 02-May-1918 (80 y.o. Male) Treating RN: Montey Hora Primary Care Physician: Hortencia Pilar Other Clinician: Referring Physician: Hortencia Pilar Treating Physician/Extender: Frann Rider in Treatment: 32 Clinic Level of Care Assessment Items TOOL 4 Quantity Score []  - Use when only an EandM is performed on FOLLOW-UP visit 0 ASSESSMENTS - Nursing Assessment /  Reassessment []  - Reassessment of Co-morbidities (includes updates in patient status) 0 X - Reassessment of Adherence to Treatment Plan 1 5 ASSESSMENTS - Wound and Skin Assessment / Reassessment []  - Simple Wound Assessment / Reassessment - one wound 0 X - Complex Wound Assessment / Reassessment - multiple wounds 2 5 []  - Dermatologic / Skin Assessment (not related to wound area) 0 ASSESSMENTS - Focused Assessment []  - Circumferential Edema Measurements - multi extremities 0 []  - Nutritional Assessment / Counseling / Intervention 0 []  - Lower Extremity Assessment (monofilament, tuning fork, pulses) 0 []  - Peripheral Arterial Disease Assessment (using hand held doppler) 0 ASSESSMENTS - Ostomy and/or Continence Assessment and Care []  - Incontinence Assessment and Management 0 []  - Ostomy Care Assessment and Management (repouching, etc.) 0 PROCESS - Coordination of Care X - Simple Patient / Family Education for ongoing care 1 15 []  - Complex (extensive) Patient / Family Education for ongoing care 0 X - Staff obtains Programmer, systems, Records, Test Results / Process Orders 1 10 []  - Staff telephones HHA, Nursing Homes / Clarify orders / etc 0 []  - Routine Transfer to another Facility (non-emergent condition) 0 Philip [name], Philip [name] ([id number]) []  - Routine Hospital Admission (non-emergent condition) 0 []  - New Admissions / Biomedical engineer / Ordering NPWT, Apligraf, etc. 0 []  - Emergency Hospital Admission (emergent condition) 0 X - Simple Discharge Coordination 1 10 []  - Complex (extensive) Discharge Coordination 0 PROCESS - Special Needs []  - Pediatric / Minor Patient Management 0 []  - Isolation Patient Management 0 []  - Hearing / Language / Visual special needs 0 []  - Assessment of Community assistance (transportation, D/C planning, etc.) 0 []  - Additional assistance / Altered mentation 0 []  - Support Surface(s) Assessment (bed, cushion, seat, etc.) 0 INTERVENTIONS - Wound Cleansing /  Measurement []  - Simple Wound Cleansing - one wound 0 X - Complex Wound Cleansing - multiple wounds 2 5 X - Wound Imaging (photographs - any number of  wounds) 1 5 []  - Wound Tracing (instead of photographs) 0 []  - Simple Wound Measurement - one wound 0 X - Complex Wound Measurement - multiple wounds 2 5 INTERVENTIONS - Wound Dressings []  - Small Wound Dressing one or multiple wounds 0 X - Medium Wound Dressing one or multiple wounds 2 15 []  - Large Wound Dressing one or multiple wounds 0 []  - Application of Medications - topical 0 []  - Application of Medications - injection 0 INTERVENTIONS - Miscellaneous []  - External ear exam 0 JEMARION, ROYCROFT ([id number]) []  - Specimen Collection (cultures, biopsies, blood, body fluids, etc.) 0 []  - Specimen(s) / Culture(s) sent or taken to Lab for analysis 0 []  - Patient Transfer (multiple staff / Harrel Lemon Lift / Similar devices) 0 []  - Simple Staple / Suture removal (25 or less) 0 []  - Complex Staple / Suture removal (26 or more) 0 []  - Hypo / Hyperglycemic Management (close monitor of Blood Glucose) 0 []  - Ankle / Brachial Index (ABI) - do not check if billed separately 0 X - Vital Signs 1 5 Has the patient been seen at the hospital within the last three years: Yes Total Score: 110 Level Of Care: New/Established - Level 3 Electronic Signature(s) Signed: 12/13/2014 3:10:47 PM By: Montey Hora Entered By: Montey Hora on 12/13/2014 10:59:03 Philip [name] ([id number]) -------------------------------------------------------------------------------- Encounter Discharge Information Details Patient Name: Philip [name], Philip [name]. Date of Service: 12/13/2014 10:15 AM Medical Record Number: [id number] Patient Account Number: [id number] Date of Birth/Sex: 12/18/1918 (81 y.o. Male) Treating RN: Cornell Barman Primary Care Physician: Hortencia Pilar Other Clinician: Referring Physician: Hortencia Pilar Treating Physician/Extender: Frann Rider in  Treatment: 30 Encounter Discharge Information Items Discharge Pain Level: 0 Discharge Condition: Stable Ambulatory Status: Cane Discharge Destination: Home Transportation: Private Auto Accompanied By: son Schedule Follow-up Appointment: Yes Medication Reconciliation completed No and provided to Patient/Care Aldena Worm: Provided on Clinical Summary of Care: 12/13/2014 Form Type Recipient Paper Patient GT Electronic Signature(s) Signed: 12/13/2014 3:10:47 PM By: Montey Hora Previous Signature: 12/13/2014 11:14:58 AM Version By: Ruthine Dose Entered By: Montey Hora on 12/13/2014 11:15:18 Philip [name] ([id number]) -------------------------------------------------------------------------------- Multi Wound Chart Details Patient Name: Philip [name]. Date of Service: 12/13/2014 10:15 AM Medical Record Number: [id number] Patient Account Number: [id number] Date of Birth/Sex: [birth year] (81 y.o. 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] [4:Malignant Wound] [5:Skin Tear] Comorbid History: [3:Cataracts, Arrhythmia, Congestive Heart Failure] [4:Cataracts, Arrhythmia, Congestive Heart Failure] [5:Cataracts, Arrhythmia, Congestive Heart Failure] Date Acquired: [3:07/01/2014] [4:09/20/2014] [5:10/01/2014] Weeks of Treatment: [3:22] [4:12] [5:10] Wound Status: [3:Open] [4:Open] [5:Open] Measurements L x W x D  0.3x0.4x0.1 [4:2.1x2x0.4] [5:5x4.5x0.1] (cm) Area (cm) : [3:0.094] [4:3.299] [5:17.671] Volume (cm) : [3:0.009] [4:1.319] [5:1.767] % Reduction in Area: [3:95.00%] [4:-2001.30%] [5:-181.30%] % Reduction in Volume: 98.80% [4:-8143.70%] [5:-181.40%] Classification: [3:Full Thickness Without Exposed Support Structures] [4:Full Thickness Without Exposed Support Structures] [5:Partial Thickness] Exudate Amount: [3:Small] [4:Small] [5:Large] Exudate Type: [3:Serosanguineous] [4:Serosanguineous] [5:Serosanguineous] Exudate Color: [3:red, brown] [4:red, brown] [5:red, brown] Wound Margin: [3:Distinct, outline attached] [4:Distinct, outline attached] [5:Distinct, outline attached] Granulation Amount: [3:Small (1-33%)] [4:Small (1-33%)] [5:Large (67-100%)] Granulation Quality: [3:Pink] [  4:Pink] [5:Red] Necrotic Amount: [3:Small (1-33%)] [4:Medium (34-66%)] [5:None Present (0%)] Exposed Structures: [3:Fascia: No Fat: No Tendon: No Muscle: No Joint: No] [4:Fat: Yes Fascia: No Tendon: No Muscle: No] [5:Fascia: No Fat: No Tendon: No Muscle: No Joint: No] Bone: No Joint: No Bone: No Limited to Skin Bone: No Limited to Skin Breakdown Breakdown Epithelialization: None None Large (67-100%) Periwound Skin Texture: Edema: Yes Edema: Yes Edema: No Excoriation: No Excoriation: No Excoriation: No Induration: No Induration: No Induration: No Callus: No Callus: No Callus: No Crepitus: No Crepitus: No Crepitus: No Fluctuance: No Fluctuance: No Fluctuance: No Friable: No Friable: No Friable: No Rash: No Rash: No Rash: No Scarring: No Scarring: No Scarring: No Periwound Skin Moist: Yes Moist: Yes Maceration: No Moisture: Maceration: No Maceration: No Moist: No Dry/Scaly: No Dry/Scaly: No Dry/Scaly: No Periwound Skin Color: Atrophie Blanche: No Atrophie Blanche: No Atrophie Blanche: No Cyanosis: No Cyanosis: No Cyanosis: No Ecchymosis: No Ecchymosis: No Ecchymosis: No Erythema:  No Erythema: No Erythema: No Hemosiderin Staining: No Hemosiderin Staining: No Hemosiderin Staining: No Mottled: No Mottled: No Mottled: No Pallor: No Pallor: No Pallor: No Rubor: No Rubor: No Rubor: No Temperature: No Abnormality No Abnormality No Abnormality Tenderness on No Yes No Palpation: Wound Preparation: Ulcer Cleansing: Ulcer Cleansing: Ulcer Cleansing: Rinsed/Irrigated with Rinsed/Irrigated with Rinsed/Irrigated with Saline Saline Saline Topical Anesthetic Topical Anesthetic Topical Anesthetic Applied: None Applied: Other: lidocaine Applied: None 4% Treatment Notes Electronic Signature(s) Signed: 12/13/2014 3:10:47 PM By: Montey Hora Entered By: Montey Hora on 12/13/2014 10:48:22 Philip [name] ([id number]) -------------------------------------------------------------------------------- [place] Details Patient Name: Philip [name], Philip [name]. Date of Service: 12/13/2014 10:15 AM Medical Record Number: [id number] Patient Account Number: [id number] Date of Birth/Sex: [birth year] (81 y.o. Male) Treating RN: Montey Hora Primary Care Physician: Hortencia Pilar Other Clinician: Referring Physician: Hortencia Pilar Treating Physician/Extender: Frann Rider in Treatment: 56 Active Inactive Abuse / Safety / Falls / Self Care Management Nursing Diagnoses: Abuse or neglect; actual or potential Potential for falls Goals: Patient will remain injury free Date Initiated: 05/02/2014 Goal Status: Active Interventions: Assess fall risk on admission and as needed Notes: Necrotic Tissue Nursing Diagnoses: Impaired tissue integrity related to necrotic/devitalized tissue Goals: Necrotic/devitalized tissue will be minimized in the wound bed Date Initiated: 05/02/2014 Goal Status: Active Interventions: Assess patient pain level pre-, during and post procedure and prior to discharge Treatment Activities: Apply topical anesthetic as ordered :  12/13/2014 Notes: Orientation to the Wound Care Program Nursing Diagnoses: Knowledge deficit related to the wound healing center program Philip [name], Philip [name] ([id number]) Goals: Patient/caregiver will verbalize understanding of the [facility] [place] Program Date Initiated: 05/02/2014 Goal Status: Active Interventions: Provide education on orientation to the wound center Notes: Electronic Signature(s) Signed: 12/13/2014 3:10:47 PM By: Montey Hora Entered By: Montey Hora on 12/13/2014 10:48:10 Philip [name] ([id number]) -------------------------------------------------------------------------------- Patient/Caregiver Education Details Patient Name: Philip [name]. Date of Service: 12/13/2014 10:15 AM Medical Record Number: [id number] Patient Account Number: [id number] Date of Birth/Gender: 1918/04/28 (81 y.o. Male) Treating RN: Montey Hora Primary Care Physician: Hortencia Pilar Other Clinician: Referring Physician: Hortencia Pilar Treating Physician/Extender: Frann Rider in Treatment: 3 Education Assessment Education Provided To: Patient and Caregiver Education Topics Provided Wound/Skin Impairment: Handouts: Other: wound care as ordered Methods: Demonstration, Explain/Verbal Responses: State content correctly Electronic Signature(s) Signed: 12/13/2014 3:10:47 PM By: Montey Hora Entered By: Montey Hora on 12/13/2014 11:15:36 Philip [name] ([id number]) -------------------------------------------------------------------------------- Wound Assessment Details Patient Name: Philip [name], Philip [name]. Date of Service: 12/13/2014 10:15 AM Medical Record Number: [id number]  Patient Account Number: [id number] Date of Birth/Sex: 10-Apr-1919 (81 y.o. Male) Treating RN: Montey Hora Primary Care Physician: Hortencia Pilar Other Clinician: Referring Physician: Hortencia Pilar Treating Physician/Extender: Frann Rider in Treatment: 32 Wound Status Wound Number: 3  Primary Malignant Wound Etiology: Wound Location: Right Foot - Dorsal Wound Status: Healed - Epithelialized Wounding Event: Surgical Injury Comorbid Cataracts, Arrhythmia, Congestive Date Acquired: 07/01/2014 History: Heart Failure Weeks Of Treatment: 22 Clustered Wound: No Photos Photo Uploaded By: Montey Hora on 12/13/2014 15:01:38 Wound Measurements Length: (cm) 0 % Reduction i Width: (cm) 0 % Reduction i Depth: (cm) 0 Epithelializa Area: (cm) 0 Tunneling: Volume: (cm) 0 Undermining: n Area: 100% n Volume: 100% tion: None No No Wound Description Full Thickness Without Exposed Classification: Support Structures Wound Margin: Distinct, outline attached Exudate Small Amount: Exudate Type: Serosanguineous Exudate Color: red, brown Foul Odor After Cleansing: No Wound Bed Granulation Amount: Large (67-100%) Exposed Structure Granulation Quality: Pink Fascia Exposed: No Necrotic Amount: None Present (0%) Fat Layer Exposed: No Philip [name], Philip [name] ([id number]) Tendon Exposed: No Muscle Exposed: No Joint Exposed: No Bone Exposed: No Limited to Skin Breakdown Periwound Skin Texture Texture Color No Abnormalities Noted: No No Abnormalities Noted: No Callus: No Atrophie Blanche: No Crepitus: No Cyanosis: No Excoriation: No Ecchymosis: No Fluctuance: No Erythema: No Friable: No Hemosiderin Staining: No Induration: No Mottled: No Localized Edema: Yes Pallor: No Rash: No Rubor: No Scarring: No Temperature / Pain Moisture Temperature: No Abnormality No Abnormalities Noted: No Dry / Scaly: No Maceration: No Moist: No Wound Preparation Ulcer Cleansing: Rinsed/Irrigated with Saline Topical Anesthetic Applied: None Electronic Signature(s) Signed: 12/13/2014 3:10:47 PM By: Montey Hora Entered By: Montey Hora on 12/13/2014 10:55:34 Philip [name] ([id number]) -------------------------------------------------------------------------------- Wound  Assessment Details Patient Name: Philip [name]. Date of Service: 12/13/2014 10:15 AM Medical Record Number: [id number] Patient Account Number: [id number] Date of Birth/Sex: February 04, 1919 (81 y.o. Male) Treating RN: Montey Hora Primary Care Physician: Hortencia Pilar Other Clinician: Referring Physician: Hortencia Pilar Treating Physician/Extender: Frann Rider in Treatment: 32 Wound Status Wound Number: 4 Primary Malignant Wound Etiology: Wound Location: Left Malleolus - Medial Wound Status: Open Wounding Event: Other Lesion Comorbid Cataracts, Arrhythmia, Congestive Date Acquired: 09/20/2014 History: Heart Failure Weeks Of Treatment: 12 Clustered Wound: No Photos Photo Uploaded By: Montey Hora on 12/13/2014 15:01:23 Wound Measurements Length: (cm) 2.1 Width: (cm) 2 Depth: (cm) 0.4 Area: (cm) 3.299 Volume: (cm) 1.319 % Reduction in Area: -2001.3% % Reduction in Volume: -8143.7% Epithelialization: None Tunneling: No Undermining: No Wound Description Full Thickness With Exposed Classification: Support Structures Wound Margin: Distinct, outline attached Exudate Small Amount: Exudate Type: Serosanguineous Exudate Color: red, brown Foul Odor After Cleansing: No Wound Bed Granulation Amount: Small (1-33%) Exposed Structure Granulation Quality: Pink Fascia Exposed: Yes Necrotic Amount: Medium (34-66%) Fat Layer Exposed: No Philip [name], Philip [name] ([id number]) Necrotic Quality: Adherent Slough Tendon Exposed: No Muscle Exposed: No Joint Exposed: No Bone Exposed: No Periwound Skin Texture Texture Color No Abnormalities Noted: No No Abnormalities Noted: No Callus: No Atrophie Blanche: No Crepitus: No Cyanosis: No Excoriation: No Ecchymosis: No Fluctuance: No Erythema: No Friable: No Hemosiderin Staining: No Induration: No Mottled: No Localized Edema: Yes Pallor: No Rash: No Rubor: No Scarring: No Temperature / Pain Moisture Temperature: No  Abnormality No Abnormalities Noted: No Tenderness on Palpation: Yes Dry / Scaly: No Maceration: No Moist: Yes Wound Preparation Ulcer Cleansing: Rinsed/Irrigated with Saline Topical Anesthetic Applied: Other: lidocaine 4%, Treatment Notes Wound #4 (Left, Medial Malleolus) 1. Cleansed with: Clean wound with Normal  Saline 2. Anesthetic Topical Lidocaine 4% cream to wound bed prior to debridement 3. Peri-wound Care: Skin Prep 4. Dressing Applied: Hydrafera Blue 5. Secondary Dressing Applied Bordered Foam Dressing Electronic Signature(s) Signed: 12/13/2014 3:10:47 PM By: Montey Hora Entered By: Montey Hora on 12/13/2014 10:58:03 Philip [name] ([id number]) -------------------------------------------------------------------------------- Wound Assessment Details Patient Name: Philip [name], Philip [name]. Date of Service: 12/13/2014 10:15 AM Medical Record Number: [id number] Patient Account Number: [id number] Date of Birth/Sex: 1918/12/12 (81 y.o. Male) Treating RN: Montey Hora Primary Care Physician: Hortencia Pilar Other Clinician: Referring Physician: Hortencia Pilar Treating Physician/Extender: Frann Rider in Treatment: 32 Wound Status Wound Number: 5 Primary Skin Tear Etiology: Wound Location: Right Forearm - Lateral Wound Status: Open Wounding Event: Shear/Friction Comorbid Cataracts, Arrhythmia, Congestive Date Acquired: 10/01/2014 History: Heart Failure Weeks Of Treatment: 10 Clustered Wound: No Photos Photo Uploaded By: Montey Hora on 12/13/2014 15:01:24 Wound Measurements Length: (cm) 5 Width: (cm) 4.5 Depth: (cm) 0.1 Area: (cm) 17.671 Volume: (cm) 1.767 % Reduction in Area: -181.3% % Reduction in Volume: -181.4% Epithelialization: Large (67-100%) Tunneling: No Undermining: No Wound Description Classification: Partial Thickness Wound Margin: Distinct, outline attached Exudate Amount: Large Exudate Type: Serosanguineous Exudate Color: red,  brown Foul Odor After Cleansing: No Wound Bed Granulation Amount: Large (67-100%) Exposed Structure Granulation Quality: Red Fascia Exposed: No Necrotic Amount: None Present (0%) Fat Layer Exposed: No Tendon Exposed: No Philip [name], Philip [name] ([id number]) Muscle Exposed: No Joint Exposed: No Bone Exposed: No Limited to Skin Breakdown Periwound Skin Texture Texture Color No Abnormalities Noted: No No Abnormalities Noted: No Callus: No Atrophie Blanche: No Crepitus: No Cyanosis: No Excoriation: No Ecchymosis: No Fluctuance: No Erythema: No Friable: No Hemosiderin Staining: No Induration: No Mottled: No Localized Edema: No Pallor: No Rash: No Rubor: No Scarring: No Temperature / Pain Moisture Temperature: No Abnormality No Abnormalities Noted: No Dry / Scaly: No Maceration: No Moist: No Wound Preparation Ulcer Cleansing: Rinsed/Irrigated with Saline Topical Anesthetic Applied: None Treatment Notes Wound #5 (Right, Lateral Forearm) 1. Cleansed with: Clean wound with Normal Saline 4. Dressing Applied: Prisma Ag Other dressing (specify in notes) 5. Secondary Dressing Applied Bordered Foam Dressing Notes drawtex Electronic Signature(s) Signed: 12/13/2014 3:10:47 PM By: Montey Hora Entered By: Montey Hora on 12/13/2014 10:47:14 Philip [name] ([id number]) -------------------------------------------------------------------------------- [place] Details Patient Name: Philip [name], DORRANCE. Date of Service: 12/13/2014 10:15 AM Medical Record Number: [id number] Patient Account Number: [id number] Date of Birth/Sex: 09-04-1918 (81 y.o. Male) Treating RN: Montey Hora Primary Care Physician: Hortencia Pilar Other Clinician: Referring Physician: Hortencia Pilar Treating Physician/Extender: Frann Rider in Treatment: 46 Vital Signs Time Taken: 10:33 Temperature (F): 97.9 Height (in): 69 Pulse (bpm): 63 Weight (lbs): 179 Respiratory Rate (breaths/min): 18 Body  Mass Index (BMI): 26.4 Blood Pressure (mmHg): 110/46 Reference Range: 80 - 120 mg / dl Electronic Signature(s) Signed: 12/13/2014 3:10:47 PM By: Montey Hora Entered By: Montey Hora on 12/13/2014 10:35:00

## 2014-12-13 NOTE — Progress Notes (Signed)
Philip, [name] ([id number]) Visit Report for 12/13/2014 Chief Complaint Document Details Patient Name: Philip [name], Philip [name]. Date of Service: 12/13/2014 10:15 AM Medical Record Number: [id number] Patient Account Number: [id number] Date of Birth/Sex: 05-Jul-1918 (81 y.o. Male) Treating RN: Cornell Barman Primary Care Physician: Hortencia Pilar Other Clinician: Referring Physician: Hortencia Pilar Treating Physician/Extender: Frann Rider in Treatment: 40 Information Obtained from: Patient Chief Complaint R foot ulcer. L forearm ulcer. 07/19/2014 -- about 2 weeks ago he had a surgical procedure done by dermatologist in [place] and has an open surgical wound on the dorsum of the right foot. Electronic Signature(s) Signed: 12/13/2014 11:01:30 AM By: Christin Fudge MD, FACS Entered By: Christin Fudge on 12/13/2014 11:01:30 Philip [name] ([id number]) -------------------------------------------------------------------------------- HPI Details Patient Name: Philip, [name]. Date of Service: 12/13/2014 10:15 AM Medical Record Number: [id number] Patient Account Number: [id number] Date of Birth/Sex: January 12, 1919 (81 y.o. Male) Treating RN: Cornell Barman Primary Care Physician: Hortencia Pilar Other Clinician: Referring Physician: Hortencia Pilar Treating Physician/Extender: Frann Rider in Treatment: 53 History of Present Illness Location: right leg Duration: Dec 2015 Modifying Factors: history of an injury to the right leg with resulting hematoma and thrombophlebitis and later an ulcer of posterior leg Associated Signs and Symptoms: marked lymphedema of the right leg. He is already on Eloquis. HPI Description: 06/14/14 -- He returns for followup today. He denies any fevers. no fresh issues and his daughter says he's been doing fine. 06/21/14 -- after he sustained a fall this week earlier he applied a bandage over this himself and did not seek any medical attention. he did however manage to control  the bleeding and had a dressing in place the next morning when his son to the visit. In this dressing was removed there was further damaged skin. His right leg has been doing fine otherwise. 07/12/14 --Very pleasant 81 year old with past medical history significant for congestive heart failure (EF 15%), peripheral vascular disease, and chronic kidney disease. He was hospitalized at [facility] in December 2015 for congestive heart failure. He says that he fell during his hospital course and developed a hematoma over his right calf. He was also diagnosed with a right lower extremity DVT for which he takes Eliquis. The hematoma subsequently turned into an ulceration around Christmas, which has healed. He subsequently developed an ulcer on his right dorsal foot and a traumatic left forearm ulcer. Per his report, he underwent biopsy of the right dorsal foot ulceration which demonstrated a skin cancer. His PCP and dermatologist office are both closed today. I reviewed his records in [place] but find no report of biopsy or pathology. He s without complaints today. No significant pain. No fever or chills. Minimal drainage. 07/19/2014 - the patient and his son tell me that about 2 weeks ago the dermatologist did a skin biopsy and this was a large area on the dorsum of his right foot which was left open and no dressing instructions were recommended. Since then he has been called and told that it is a cancer and the need to do a further procedure but that will not happen until about 2 weeks from now. In the meanwhile the patient has not been taking care of his right foot. The left forearm where he had an abrasion and laceration is doing very well. 07/26/2014 -- Reports from 07/08/2014 from the dermatology group reviewed. A excision was done of a lesion located on the dorsum of the right foot and this was 1.7 cm in diameter which was a shave  biopsy performed. The wound was left open after appropriate cauterization and  the patient was given this dressing instructions. 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 08/02/2014 -- all the original problems he came with have completely resolved. He now has a surgical wound on his right foot dorsum where a skin cancer was excised. He goes to see his dermatologist this Philip [name], Philip [name] ([id number]) coming Tuesday and will have definite news next Friday. 08/09/2014 he had gone to his dermatologist on Tuesday and she has injected the base of his ulcer with some chemotherapeutic agent. He was supposed to bring some papers with him but forgot to get them and will bring them in next week. Other than that the dermatologist had suggested using Mehdi honey on the wound. 08/16/2014 -- the patient has brought in his notes from the dermatologist and on 08/06/2014 he received a injection of 5 FU, 500 mg grams into the lesion. the pathology report was also sent and it was a squamous cell carcinoma well-differentiated and edges were involved. They wanted him to use many honey for the wound dressing changes to be done 3 times a week. 08/30/2014 -- he has finished his second injection of 5-FU and has the next one in 2 weeks' time. He is doing fine otherwise. 09/13/2014 - No new complaints. No significant pain. No fever or chills. Minimal drainage. Still receiving 5-FU injections. 09/20/2014 -- He was seen by the dermatologist on 09/10/2014 and Dr. Phillip Heal injected his foot both the right on the dorsum and left near the medial malleolus with 5-FU. The next dose of 5-FU is to be given after 3 months. The patient says he now has a spot on the left medial malleolus where he was injected with 5-FU. 09/27/2014 -- the area on the left ankle where he was injected with 5-FU is now a full-blown ulcer. He also has mild pain in both ankle areas. 10/04/2014 - large had a bit of a fall and injured his right  arm last evening and has had a laceration with no evidence of any foreign body in the right forearm. Electronic Signature(s) Signed: 12/13/2014 11:01:38 AM By: Christin Fudge MD, FACS Entered By: Christin Fudge on 12/13/2014 11:01:38 Philip [name] ([id number]) -------------------------------------------------------------------------------- Physical Exam Details Patient Name: Philip [name], Philip [name]. Date of Service: 12/13/2014 10:15 AM Medical Record Number: [id number] Patient Account Number: [id number] Date of Birth/Sex: 1918-07-13 (81 y.o. Male) Treating RN: Cornell Barman Primary Care Physician: Hortencia Pilar Other Clinician: Referring Physician: Hortencia Pilar Treating Physician/Extender: Frann Rider in Treatment: 32 Constitutional . Pulse regular. Respirations normal and unlabored. Afebrile. . Eyes Nonicteric. Reactive to light. Ears, Nose, Mouth, and Throat Lips, teeth, and gums WNL.Marland Kitchen Moist mucosa without lesions . Neck supple and nontender. No palpable supraclavicular or cervical adenopathy. Normal sized without goiter. Respiratory WNL. No retractions.. Cardiovascular Pedal Pulses WNL. No clubbing, cyanosis or edema. Lymphatic No adneopathy. No adenopathy. No adenopathy. Musculoskeletal Adexa without tenderness or enlargement.. Digits and nails w/o clubbing, cyanosis, infection, petechiae, ischemia, or inflammatory conditions.. Integumentary (Hair, Skin) No suspicious lesions. No crepitus or fluctuance. No peri-wound warmth or erythema. No masses.Marland Kitchen Psychiatric Judgement and insight Intact.. No evidence of depression, anxiety, or agitation.. Notes The right arm has a few open areas and as per his dressing changes there was some maceration and witness. The dorsum of the right foot is completely healed and the medial part of the left ankle has some granulation  tissue but there is deep fascia which is visible. Electronic Signature(s) Signed: 12/13/2014 11:02:40 AM By: Christin Fudge MD, FACS Entered By: Christin Fudge on 12/13/2014 11:02:39 Philip [name] ([id number]) -------------------------------------------------------------------------------- Physician Orders Details Patient Name: ALSTON, BERRIE. Date of Service: 12/13/2014 10:15 AM Medical Record Number: [id number] Patient Account Number: [id number] Date of Birth/Sex: March 20, 1919 (81 y.o. Male) Treating RN: Montey Hora Primary Care Physician: Hortencia Pilar Other Clinician: Referring Physician: Hortencia Pilar Treating Physician/Extender: Frann Rider in Treatment: 1 Verbal / Phone Orders: Yes Clinician: Montey Hora Read Back and Verified: Yes Diagnosis Coding Wound Cleansing Wound #4 Left,Medial Malleolus o Clean wound with wound cleanser. o Cleanse wound with mild soap and water o May Shower, gently pat wound dry prior to applying new dressing. o May shower with protection. Wound #5 Right,Lateral Forearm o Clean wound with wound cleanser. o Cleanse wound with mild soap and water o May Shower, gently pat wound dry prior to applying new dressing. o May shower with protection. o Clean wound with wound cleanser. o Cleanse wound with mild soap and water o May Shower, gently pat wound dry prior to applying new dressing. o May shower with protection. Skin Barriers/Peri-Wound Care Wound #4 Left,Medial Malleolus o Skin Prep Wound #5 Right,Lateral Forearm o Skin Prep Primary Wound Dressing Wound #4 Left,Medial Malleolus o Hydrafera Blue Wound #5 Right,Lateral Forearm o Prisma Ag Secondary Dressing Wound #4 Left,Medial Malleolus o Boardered Foam Dressing Wound #5 Right,Lateral Forearm Philip [name], Philip [name]. ([id number]) o Drawtex o Boardered Foam Dressing Dressing Change Frequency Wound #4 Left,Medial Malleolus o Change dressing every other day. Wound #5 Right,Lateral Forearm o Change dressing every week Follow-up Appointments Wound #4  Left,Medial Malleolus o Return Appointment in 1 week. Wound #5 Right,Lateral Forearm o Return Appointment in 1 week. Edema Control Wound #4 Left,Medial Malleolus o Tubigrip Home Health Wound #4 [place] Visits - [place] Nurse may visit PRN to address patientos wound care needs. o FACE TO FACE ENCOUNTER: MEDICARE and MEDICAID PATIENTS: I certify that this patient is under my care and that I had a face-to-face encounter that meets the physician face-to-face encounter requirements with this patient on this date. The encounter with the patient was in whole or in part for the following MEDICAL CONDITION: (primary reason for [place]) MEDICAL NECESSITY: I certify, that based on my findings, NURSING services are a medically necessary home health service. HOME BOUND STATUS: I certify that my clinical findings support that this patient is homebound (i.e., Due to illness or injury, pt requires aid of supportive devices such as crutches, cane, wheelchairs, walkers, the use of special transportation or the assistance of another person to leave their place of residence. There is a normal inability to leave the home and doing so requires considerable and taxing effort. Other absences are for medical reasons / religious services and are infrequent or of short duration when for other reasons). o If current dressing causes regression in wound condition, may D/C ordered dressing product/s and apply Normal Saline Moist Dressing daily until next [place] / Other MD appointment. [place] of regression in wound condition at [phone]. o Please direct any NON-WOUND related issues/requests for orders to patient's Primary Care Physician Wound #5 Right,Lateral Forearm o [place] Visits - [place] Nurse may visit PRN to address patientos wound care needs. o FACE TO FACE  ENCOUNTER: MEDICARE and MEDICAID PATIENTS: I certify that this patient is under  my care and that I had a face-to-face encounter that meets the physician face-to-face Philip [name], Philip [name] ([id number]) encounter requirements with this patient on this date. The encounter with the patient was in whole or in part for the following MEDICAL CONDITION: (primary reason for [place]) MEDICAL NECESSITY: I certify, that based on my findings, NURSING services are a medically necessary home health service. HOME BOUND STATUS: I certify that my clinical findings support that this patient is homebound (i.e., Due to illness or injury, pt requires aid of supportive devices such as crutches, cane, wheelchairs, walkers, the use of special transportation or the assistance of another person to leave their place of residence. There is a normal inability to leave the home and doing so requires considerable and taxing effort. Other absences are for medical reasons / religious services and are infrequent or of short duration when for other reasons). o If current dressing causes regression in wound condition, may D/C ordered dressing product/s and apply Normal Saline Moist Dressing daily until next [place] / Other MD appointment. [place] of regression in wound condition at [phone]. o Please direct any NON-WOUND related issues/requests for orders to patient's Primary Care Physician Electronic Signature(s) Signed: 12/13/2014 2:53:21 PM By: Christin Fudge MD, FACS Signed: 12/13/2014 3:10:47 PM By: Montey Hora Entered By: Montey Hora on 12/13/2014 10:57:31 Philip [name] ([id number]) -------------------------------------------------------------------------------- Problem List Details Patient Name: Philip [name], Philip [name]. Date of Service: 12/13/2014 10:15 AM Medical Record Number: [id number] Patient Account Number: [id number] Date of Birth/Sex: November 28, 1918 (81 y.o. Male) Treating RN:  Cornell Barman Primary Care Physician: Hortencia Pilar Other Clinician: Referring Physician: Hortencia Pilar Treating Physician/Extender: Frann Rider in Treatment: 66 Active Problems ICD-10 Encounter Code Description Active Date Diagnosis I70.232 Atherosclerosis of native arteries of right leg with 06/21/2014 Yes ulceration of calf I82.401 Acute embolism and thrombosis of unspecified deep veins 06/21/2014 Yes of right lower extremity S91.301A Unspecified open wound, right foot, initial encounter 07/19/2014 Yes Z92.21 Personal history of antineoplastic chemotherapy 08/23/2014 Yes L97.522 Non-pressure chronic ulcer of other part of left foot with fat 09/20/2014 Yes layer exposed S41.111A Laceration without foreign body of right upper arm, initial 10/04/2014 Yes encounter Inactive Problems Resolved Problems ICD-10 Code Description Active Date Resolved Date L97.212 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 Date of Service: 12/13/2014 10:15 AM Medical Record Number: [id number] Patient Account Number: [id number] Date of Birth/Sex: 09-Jul-1918 (81 y.o. Male) Treating RN: Cornell Barman Primary Care Physician: Hortencia Pilar Other Clinician: Referring Physician: Hortencia Pilar Treating Physician/Extender: Frann Rider in Treatment: 74 Subjective Chief Complaint Information obtained from Patient R foot ulcer. L forearm ulcer. 07/19/2014 -- about 2 weeks ago he had a surgical procedure done by dermatologist in [place] and has an open  surgical wound on the dorsum of the right foot. History of Present Illness (HPI) The following HPI elements were documented for the patient's wound: Location: right leg Duration: Dec 2015 Modifying Factors: history of an injury to the right leg with resulting hematoma and thrombophlebitis and later an ulcer of posterior leg Associated Signs and Symptoms: marked lymphedema of the right leg. He is already on Eloquis. 06/14/14 -- He returns for followup today. He denies  any fevers. no fresh issues and his daughter says he's been doing fine. 06/21/14 -- after he sustained a fall this week earlier he applied a bandage over this himself and did not seek any medical attention. he did however manage to control the bleeding and had a dressing in place the next morning when his son to the visit. In this dressing was removed there was further damaged skin. His right leg has been doing fine otherwise. 07/12/14 --Very pleasant 81 year old with past medical history significant for congestive heart failure (EF 15%), peripheral vascular disease, and chronic kidney disease. He was hospitalized at [facility] in December 2015 for congestive heart failure. He says that he fell during his hospital course and developed a hematoma over his right calf. He was also diagnosed with a right lower extremity DVT for which he takes Eliquis. The hematoma subsequently turned into an ulceration around Christmas, which has healed. He subsequently developed an ulcer on his right dorsal foot and a traumatic left forearm ulcer. Per his report, he underwent biopsy of the right dorsal foot ulceration which demonstrated a skin cancer. His PCP and dermatologist office are both closed today. I reviewed his records in [place] but find no report of biopsy or pathology. He s without complaints today. No significant pain. No fever or chills. Minimal drainage. 07/19/2014 - the patient and his son tell me that about 2 weeks ago the dermatologist did a  skin biopsy and this was a large area on the dorsum of his right foot which was left open and no dressing instructions were recommended. Since then he has been called and told that it is a cancer and the need to do a further procedure but that will not happen until about 2 weeks from now. In the meanwhile the patient has not been taking care of his right foot. The left forearm where he had an abrasion and laceration is doing very well. Philip [name], Philip [name] ([id number]) 07/26/2014 -- Reports from 07/08/2014 from the dermatology group reviewed. A excision was done of a lesion located on the dorsum of the right foot and this was 1.7 cm in diameter which was a shave biopsy performed. The wound was left open after appropriate cauterization and the patient was given this dressing instructions. 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 08/02/2014 -- all the original problems he came with have completely resolved. He now has a surgical wound on his right foot dorsum where a skin cancer was excised. He goes to see his dermatologist this coming Tuesday and will have definite news next Friday. 08/09/2014 he had gone to his dermatologist on Tuesday and she has injected the base of his ulcer with some chemotherapeutic agent. He was supposed to bring some papers with him but forgot to get them and will bring them in next week. Other than that the dermatologist had suggested using Mehdi honey on the wound. 08/16/2014 -- the patient has brought in his notes from the dermatologist and on 08/06/2014 he received a injection of 5 FU, 500 mg grams into the lesion. the pathology report was also sent and it was a squamous cell carcinoma well-differentiated and edges were involved. They wanted him to use many honey for the wound dressing changes to be done 3 times a week. 08/30/2014 -- he has finished his second injection of 5-FU  and has the next one in 2 weeks' time. He is doing fine otherwise. 09/13/2014 -  No new complaints. No significant pain. No fever or chills. Minimal drainage. Still receiving 5-FU injections. 09/20/2014 -- He was seen by the dermatologist on 09/10/2014 and Dr. Phillip Heal injected his foot both the right on the dorsum and left near the medial malleolus with 5-FU. The next dose of 5-FU is to be given after 3 months. The patient says he now has a spot on the left medial malleolus where he was injected with 5-FU. 09/27/2014 -- the area on the left ankle where he was injected with 5-FU is now a full-blown ulcer. He also has mild pain in both ankle areas. 10/04/2014 - large had a bit of a fall and injured his right arm last evening and has had a laceration with no evidence of any foreign body in the right forearm. Objective Constitutional Pulse regular. Respirations normal and unlabored. Afebrile. Vitals Time Taken: 10:33 AM, Height: 69 in, Weight: 179 lbs, BMI: 26.4, Temperature: 97.9 F, Pulse: 63 bpm, Respiratory Rate: 18 breaths/min, Blood Pressure: 110/46 mmHg. Philip [name], Philip [name] ([id number]) Eyes Nonicteric. Reactive to light. Ears, Nose, Mouth, and Throat Lips, teeth, and gums WNL.Marland Kitchen Moist mucosa without lesions . Neck supple and nontender. No palpable supraclavicular or cervical adenopathy. Normal sized without goiter. Respiratory WNL. No retractions.. Cardiovascular Pedal Pulses WNL. No clubbing, cyanosis or edema. Lymphatic No adneopathy. No adenopathy. No adenopathy. Musculoskeletal Adexa without tenderness or enlargement.. Digits and nails w/o clubbing, cyanosis, infection, petechiae, ischemia, or inflammatory conditions.Marland Kitchen Psychiatric Judgement and insight Intact.. No evidence of depression, anxiety, or agitation.. General Notes: The right arm has a few open areas and as per his dressing changes there was some maceration and witness. The dorsum of the right foot is completely healed  and the medial part of the left ankle has some granulation tissue but there is deep fascia which is visible. Integumentary (Hair, Skin) No suspicious lesions. No crepitus or fluctuance. No peri-wound warmth or erythema. No masses.. Wound #3 status is Healed - Epithelialized. Original cause of wound was Surgical Injury. The wound is located on the Right,Dorsal Foot. The wound measures 0cm length x 0cm width x 0cm depth; 0cm^2 area and 0cm^3 volume. The wound is limited to skin breakdown. There is no tunneling or undermining noted. There is a small amount of serosanguineous drainage noted. The wound margin is distinct with the outline attached to the wound base. There is large (67-100%) pink granulation within the wound bed. There is no necrotic tissue within the wound bed. The periwound skin appearance exhibited: Localized Edema. The periwound skin appearance did not exhibit: Callus, Crepitus, Excoriation, Fluctuance, Friable, Induration, Rash, Scarring, Dry/Scaly, Maceration, Moist, Atrophie Blanche, Cyanosis, Ecchymosis, Hemosiderin Staining, Mottled, Pallor, Rubor, Erythema. Periwound temperature was noted as No Abnormality. Wound #4 status is Open. Original cause of wound was Other Lesion. The wound is located on the Left,Medial Malleolus. The wound measures 2.1cm length x 2cm width x 0.4cm depth; 3.299cm^2 area and 1.319cm^3 volume. There is fascia exposed. There is no tunneling or undermining noted. There is a small amount of serosanguineous drainage noted. The wound margin is distinct with the outline attached to the wound base. There is small (1-33%) pink granulation within the wound bed. There is a medium (34-66%) amount of necrotic tissue within the wound bed including Adherent Slough. The periwound skin appearance exhibited: Localized Edema, Moist. The periwound skin appearance did not exhibit: Callus, Crepitus, Excoriation, Fluctuance, Friable, Induration, Rash, Scarring, Dry/Scaly,  Maceration, Atrophie DAO, MEMMOTT. ([id number]) Cyanosis, Ecchymosis, Hemosiderin Staining, Mottled, Pallor, Rubor, Erythema.  Periwound temperature was noted as No Abnormality. The periwound has tenderness on palpation. Wound #5 status is Open. Original cause of wound was Shear/Friction. The wound is located on the Right,Lateral Forearm. The wound measures 5cm length x 4.5cm width x 0.1cm depth; 17.671cm^2 area and 1.767cm^3 volume. The wound is limited to skin breakdown. There is no tunneling or undermining noted. There is a large amount of serosanguineous drainage noted. The wound margin is distinct with the outline attached to the wound base. There is large (67-100%) red granulation within the wound bed. There is no necrotic tissue within the wound bed. The periwound skin appearance did not exhibit: Callus, Crepitus, Excoriation, Fluctuance, Friable, Induration, Localized Edema, Rash, Scarring, Dry/Scaly, Maceration, Moist, Atrophie Blanche, Cyanosis, Ecchymosis, Hemosiderin Staining, Mottled, Pallor, Rubor, Erythema. Periwound temperature was noted as No Abnormality. 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 The right foot is completely healed and we have recommended just some protective bordered foam. On the left ankle use Hydrofera Blue and aborted for him. He is going to visit his dermatologist and I have urged him not to give anymore  5-FU injections if it can be helped. He will see me back next week. Plan Wound Cleansing: Wound #4 Left,Medial Malleolus: Clean wound with wound cleanser. Cleanse wound with mild soap and water May Shower, gently pat wound dry prior to applying new dressing. May shower with protection. Wound #5 Right,Lateral Forearm: Clean wound with wound cleanser. Cleanse wound with mild soap and water Philip [name], Philip [name] ([id number]) May Shower, gently pat wound dry prior to applying new dressing. May shower with protection. Clean wound with wound cleanser. Cleanse wound with mild soap and water May Shower, gently pat wound dry prior to applying new dressing. May shower with protection. Skin Barriers/Peri-Wound Care: Wound #4 Left,Medial Malleolus: Skin Prep Wound #5 Right,Lateral Forearm: Skin Prep Primary Wound Dressing: Wound #4 Left,Medial Malleolus: Hydrafera Blue Wound #5 Right,Lateral Forearm: Prisma Ag Secondary Dressing: Wound #4 Left,Medial Malleolus: Boardered Foam Dressing Wound #5 Right,Lateral Forearm: Drawtex Boardered Foam Dressing Dressing Change Frequency: Wound #4 Left,Medial Malleolus: Change dressing every other day. Wound #5 Right,Lateral Forearm: Change dressing every week Follow-up Appointments: Wound #4 Left,Medial Malleolus: Return Appointment in 1 week. Wound #5 Right,Lateral Forearm: Return Appointment in 1 week. Edema Control: Wound #4 Left,Medial Malleolus: [facility] Home Health: Wound #4 Left,Medial Malleolus: Continue Home Health Visits - [place] Nurse may visit PRN to address patient s wound care needs. FACE TO FACE ENCOUNTER: MEDICARE and MEDICAID PATIENTS: I certify that this patient is under my care and that I had a face-to-face encounter that meets the physician face-to-face encounter requirements with this patient on this date. The encounter with the patient was in whole or in part for the following MEDICAL CONDITION: (primary  reason for [place]) MEDICAL NECESSITY: I certify, that based on my findings, NURSING services are a medically necessary home health service. HOME BOUND STATUS: I certify that my clinical findings support that this patient is homebound (i.e., Due to  illness or injury, pt requires aid of supportive devices such as crutches, cane, wheelchairs, walkers, the use of special transportation or the assistance of another person to leave their place of residence. There is a normal inability to leave the home and doing so requires considerable and taxing effort. Other absences are for medical reasons / religious services and are infrequent or of short duration when for other reasons). If current dressing causes regression in wound condition, may D/C ordered dressing product/s and apply Normal Saline Moist Dressing daily until next [place] / Other MD appointment. Notify Wound EDGE, MAUGER ([id number]) [place] of regression in wound condition at [phone]. Please direct any NON-WOUND related issues/requests for orders to patient's Primary Care Physician Wound #5 Right,Lateral Forearm: [place] Nurse may visit PRN to address patient s wound care needs. FACE TO FACE ENCOUNTER: MEDICARE and MEDICAID PATIENTS: I certify that this patient is under my care and that I had a face-to-face encounter that meets the physician face-to-face encounter requirements with this patient on this date. The encounter with the patient was in whole or in part for the following MEDICAL CONDITION: (primary reason for [place]) MEDICAL NECESSITY: I certify, that based on my findings, NURSING services are a medically necessary home health service. HOME BOUND STATUS: I certify that my clinical findings support that this patient is homebound (i.e., Due to illness or injury, pt requires aid of supportive devices such as crutches, cane, wheelchairs, walkers,  the use of special transportation or the assistance of another person to leave their place of residence. There is a normal inability to leave the home and doing so requires considerable and taxing effort. Other absences are for medical reasons / religious services and are infrequent or of short duration when for other reasons). If current dressing causes regression in wound condition, may D/C ordered dressing product/s and apply Normal Saline Moist Dressing daily until next [place] / Other MD appointment. [place] of regression in wound condition at [phone]. Please direct any NON-WOUND related issues/requests for orders to patient's Primary Care Physician I have recommended Prisma and Rotex over the right arm and to be left in place for a week. The right foot is completely healed and we have recommended just some protective bordered foam. On the left ankle use Hydrofera Blue and aborted for him. He is going to visit his dermatologist and I have urged him not to give anymore 5-FU injections if it can be helped. He will see me back next week. Electronic Signature(s) Signed: 12/13/2014 11:03:35 AM By: Christin Fudge MD, FACS Entered By: Christin Fudge on 12/13/2014 11:03:35 Philip [name] ([id number]) -------------------------------------------------------------------------------- SuperBill Details Patient Name: Philip [name], Philip [name]. Date of Service: 12/13/2014 Medical Record Number: [id number] Patient Account Number: [id number] Date of Birth/Sex: 1918/12/11 (81 y.o. Male) Treating RN: Cornell Barman Primary Care Physician: Hortencia Pilar Other Clinician: Referring Physician: Hortencia Pilar Treating Physician/Extender: Frann Rider in Treatment: 78 Diagnosis Coding ICD-10 Codes Code Description [id number] 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 I82.401 Acute embolism and thrombosis of unspecified deep veins of right lower  extremity S91.301A Unspecified open wound, right foot, initial encounter Z92.21 Personal history of antineoplastic chemotherapy L97.522 Non-pressure chronic ulcer of other part of left foot with fat layer exposed S41.111A Laceration without foreign body of right upper arm, initial encounter Facility Procedures The patient participates with Medicare or their insurance follows the Medicare Facility Guidelines: CPT4  Code Description Modifier Quantity [id number] 99213 - WOUND CARE VISIT-LEV 3 EST PT 1 Physician Procedures CPT4 Code Description: [id number] - WC PHYS LEVEL 3 - EST PT ICD-10 Description Diagnosis I70.232 Atherosclerosis of native arteries of right leg with ul Z92.21 Personal history of antineoplastic chemotherapy L97.522 Non-pressure chronic ulcer of  other part of left foot w Modifier: ceration of ith fat laye Quantity: 1 calf r exposed Electronic Signature(s) Signed: 12/13/2014 11:03:53 AM By: Christin Fudge MD, FACS Entered By: Christin Fudge on 12/13/2014 11:03:53

## 2014-12-20 ENCOUNTER — Encounter: Payer: Medicare Other | Attending: Surgery | Admitting: Surgery

## 2014-12-20 DIAGNOSIS — I70232 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: Secondary | ICD-10-CM | POA: Diagnosis not present

## 2014-12-20 DIAGNOSIS — I82401 Acute embolism and thrombosis of unspecified deep veins of right lower extremity: Secondary | ICD-10-CM | POA: Diagnosis not present

## 2014-12-20 DIAGNOSIS — L97522 Non-pressure chronic ulcer of other part of left foot with fat layer exposed: Secondary | ICD-10-CM | POA: Diagnosis present

## 2014-12-20 DIAGNOSIS — X58XXXD Exposure to other specified factors, subsequent encounter: Secondary | ICD-10-CM | POA: Diagnosis not present

## 2014-12-20 DIAGNOSIS — S91301D Unspecified open wound, right foot, subsequent encounter: Secondary | ICD-10-CM | POA: Diagnosis not present

## 2014-12-20 DIAGNOSIS — S41111D Laceration without foreign body of right upper arm, subsequent encounter: Secondary | ICD-10-CM | POA: Diagnosis not present

## 2014-12-20 DIAGNOSIS — Z9221 Personal history of antineoplastic chemotherapy: Secondary | ICD-10-CM | POA: Diagnosis not present

## 2014-12-20 NOTE — Progress Notes (Addendum)
EIN, RIJO ([id number]) Visit Report for 12/20/2014 Arrival Information Details Patient Name: Philip [name], Philip [name]. Date of Service: 12/20/2014 10:15 AM Medical Record Number: [id number] Patient Account Number: [id number] Date of Birth/Sex: [birth year] (81 y.o. Male) Treating RN: Baruch Gouty, RN, BSN, Velva Harman Primary Care Physician: Hortencia Pilar Other Clinician: Referring Physician: Hortencia Pilar Treating Physician/Extender: Frann Rider in Treatment: 21 Visit Information History Since Last Visit Any new allergies or adverse reactions: No Patient Arrived: [name] Had a fall or experienced change in No Arrival Time: 10:23 activities of daily living that may affect Accompanied By: DTR risk of falls: Transfer Assistance: None Signs or symptoms of abuse/neglect since last No Patient Identification Verified: Yes visito Secondary Verification Process Yes Hospitalized since last visit: No Completed: Has Dressing in Place as Prescribed: Yes Patient Requires Transmission- No Pain Present Now: No Based Precautions: Patient Has Alerts: Yes Patient Alerts: Patient on Blood Thinner No ABIs dt LE blood clots Electronic Signature(s) Signed: 12/20/2014 10:49:21 AM By: Regan Lemming BSN, RN Previous Signature: 12/20/2014 10:23:31 AM Version By: Regan Lemming BSN, RN Entered By: Regan Lemming on 12/20/2014 10:49:20 Philip [name] ([id number]) -------------------------------------------------------------------------------- Clinic Level of Care Assessment Details Patient Name: Philip [name]. Date of Service: 12/20/2014 10:15 AM Medical Record Number: [id number] Patient Account Number: [id number] Date of Birth/Sex: September 22, 1918 (81 y.o. Male) Treating RN: Baruch Gouty, RN, BSN, [place] Primary Care Physician: Hortencia Pilar Other Clinician: Referring Physician: Hortencia Pilar Treating Physician/Extender: Frann Rider in Treatment: 42 Clinic Level of Care Assessment Items TOOL 4 Quantity Score []  - Use when  only an EandM is performed on FOLLOW-UP visit 0 ASSESSMENTS - Nursing Assessment / Reassessment []  - Reassessment of Co-morbidities (includes updates in patient status) 0 []  - Reassessment of Adherence to Treatment Plan 0 ASSESSMENTS - Wound and Skin Assessment / Reassessment X - Simple Wound Assessment / Reassessment - one wound 1 5 []  - Complex Wound Assessment / Reassessment - multiple wounds 0 []  - Dermatologic / Skin Assessment (not related to wound area) 0 ASSESSMENTS - Focused Assessment []  - Circumferential Edema Measurements - multi extremities 0 []  - Nutritional Assessment / Counseling / Intervention 0 X - Lower Extremity Assessment (monofilament, tuning fork, pulses) 1 5 []  - Peripheral Arterial Disease Assessment (using hand held doppler) 0 ASSESSMENTS - Ostomy and/or Continence Assessment and Care []  - Incontinence Assessment and Management 0 []  - Ostomy Care Assessment and Management (repouching, etc.) 0 PROCESS - Coordination of Care X - Simple Patient / Family Education for ongoing care 1 15 []  - Complex (extensive) Patient / Family Education for ongoing care 0 []  - Staff obtains Programmer, systems, Records, Test Results / Process Orders 0 []  - Staff telephones HHA, Nursing Homes / Clarify orders / etc 0 []  - Routine Transfer to another Facility (non-emergent condition) 0 LYALL, FACIANE ([id number]) []  - Routine Hospital Admission (non-emergent condition) 0 []  - New Admissions / Biomedical engineer / Ordering NPWT, Apligraf, etc. 0 []  - Emergency Hospital Admission (emergent condition) 0 []  - Simple Discharge Coordination 0 []  - Complex (extensive) Discharge Coordination 0 PROCESS - Special Needs []  - Pediatric / Minor Patient Management 0 []  - Isolation Patient Management 0 []  - Hearing / Language / Visual special needs 0 []  - Assessment of Community assistance (transportation, D/C planning, etc.) 0 []  - Additional assistance / Altered mentation 0 []  - Support  Surface(s) Assessment (bed, cushion, seat, etc.) 0 INTERVENTIONS - Wound Cleansing / Measurement []  - Simple Wound Cleansing - one wound 0 X - Complex  Wound Cleansing - multiple wounds 1 5 X - Wound Imaging (photographs - any number of wounds) 1 5 []  - Wound Tracing (instead of photographs) 0 []  - Simple Wound Measurement - one wound 0 []  - Complex Wound Measurement - multiple wounds 0 INTERVENTIONS - Wound Dressings X - Small Wound Dressing one or multiple wounds 1 10 []  - Medium Wound Dressing one or multiple wounds 0 []  - Large Wound Dressing one or multiple wounds 0 []  - Application of Medications - topical 0 []  - Application of Medications - injection 0 INTERVENTIONS - Miscellaneous []  - External ear exam 0 WILKIE, ZENON ([id number]) []  - Specimen Collection (cultures, biopsies, blood, body fluids, etc.) 0 []  - Specimen(s) / Culture(s) sent or taken to Lab for analysis 0 []  - Patient Transfer (multiple staff / Harrel Lemon Lift / Similar devices) 0 []  - Simple Staple / Suture removal (25 or less) 0 []  - Complex Staple / Suture removal (26 or more) 0 []  - Hypo / Hyperglycemic Management (close monitor of Blood Glucose) 0 []  - Ankle / Brachial Index (ABI) - do not check if billed separately 0 X - Vital Signs 1 5 Has the patient been seen at the hospital within the last three years: Yes Total Score: 50 Level Of Care: New/Established - Level 2 Electronic Signature(s) Signed: 12/20/2014 10:47:23 AM By: Regan Lemming BSN, RN Entered By: Regan Lemming on 12/20/2014 10:47:23 Philip [name] ([id number]) -------------------------------------------------------------------------------- Encounter Discharge Information Details Patient Name: [name], [name]. Date of Service: 12/20/2014 10:15 AM Medical Record Number: [id number] Patient Account Number: [id number] Date of Birth/Sex: April 23, 1918 (81 y.o. Male) Treating RN: Baruch Gouty, RN, BSN, Velva Harman Primary Care Physician: Hortencia Pilar Other  Clinician: Referring Physician: Hortencia Pilar Treating Physician/Extender: Frann Rider in Treatment: 61 Encounter Discharge Information Items Discharge Pain Level: 0 Discharge Condition: Stable Ambulatory Status: Cane Discharge Destination: Home Transportation: Private Auto Accompanied By: dtr Schedule Follow-up Appointment: No Medication Reconciliation completed No and provided to Patient/Care Anapaula Severt: Provided on Clinical Summary of Care: 12/20/2014 Form Type Recipient Paper Patient GT Electronic Signature(s) Signed: 12/20/2014 10:49:56 AM By: Ruthine Dose Previous Signature: 12/20/2014 10:48:03 AM Version By: Regan Lemming BSN, RN Entered By: Ruthine Dose on 12/20/2014 10:49:56 Philip [name] ([id number]) -------------------------------------------------------------------------------- Lower Extremity Assessment Details Patient Name: KYLEY, LAUREL. Date of Service: 12/20/2014 10:15 AM Medical Record Number: [id number] Patient Account Number: [id number] Date of Birth/Sex: 06/09/1918 (81 y.o. Male) Treating RN: Baruch Gouty, RN, BSN, Velva Harman Primary Care Physician: Hortencia Pilar Other Clinician: Referring Physician: Hortencia Pilar Treating Physician/Extender: Frann Rider in Treatment: 27 Vascular Assessment Pulses: Posterior Tibial Dorsalis Pedis Palpable: [Left:Yes] [Right:Yes] Extremity colors, hair growth, and conditions: Extremity Color: [Left:Mottled] [Right:Mottled] Hair Growth on Extremity: [Right:No] Temperature of Extremity: [Left:Warm] [Right:Warm] Capillary Refill: [Left:< 3 seconds] [Right:> 3 seconds] Toe Nail Assessment Left: Right: Thick: Yes Yes Discolored: Yes Yes Deformed: No No Improper Length and Hygiene: No No Electronic Signature(s) Signed: 12/20/2014 10:29:54 AM By: Regan Lemming BSN, RN Entered By: Regan Lemming on 12/20/2014 10:29:54 Philip [name]  ([id number]) -------------------------------------------------------------------------------- Multi Wound Chart Details Patient Name: Philip [name]. Date of Service: 12/20/2014 10:15 AM Medical Record Number: [id number] Patient Account Number: [id number] Date of Birth/Sex: [birth year] (81 y.o. 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] [5:Skin Tear] [N/A:N/A] Comorbid History: [4:Cataracts, Arrhythmia, Congestive Heart Failure] [5:N/A] [N/A:N/A] Date Acquired: [4:09/20/2014] [5:10/01/2014] [N/A:N/A] Weeks of Treatment: [4:13] [5:11] [N/A:N/A] Wound Status: [4:Open] [5:Healed - Epithelialized] [N/A:N/A] Measurements L x W x D 2.2x2x0.4 [5:0x0x0] [N/A:N/A] (cm) Area (cm) : [4:3.456] [5:0] [N/A:N/A] Volume (cm) : [4:1.382] [5:0] [N/A:N/A] % Reduction in Area: [4:-2101.30%] [5:100.00%] [N/A:N/A] % Reduction in Volume: -8537.50% [5:100.00%] [N/A:N/A] Classification: [4:Full Thickness With Exposed Support Structures] [5:Partial Thickness] [N/A:N/A] Exudate Amount: [4:Small] [5:N/A] [N/A:N/A] Exudate Type: [4:Serosanguineous] [5:N/A] [N/A:N/A] Exudate Color: [4:red, brown] [5:N/A] [N/A:N/A] Wound Margin: [4:Distinct, outline attached] [5:N/A] [N/A:N/A] Granulation Amount: [4:Small (1-33%)] [5:N/A] [N/A:N/A] Granulation Quality: [4:Pink] [5:N/A] [N/A:N/A] Necrotic Amount: [4:Medium (34-66%)] [5:N/A] [N/A:N/A] Exposed  Structures: [4:Fascia: Yes Fat: No Tendon: No Muscle: No] [5:N/A] [N/A:N/A] Joint: No Bone: No Epithelialization: None N/A N/A Periwound Skin Texture: Edema: Yes No Abnormalities Noted N/A Excoriation: No Induration: No Callus: No Crepitus: No Fluctuance: No Friable: No Rash: No Scarring: No Periwound Skin Moist: Yes No Abnormalities Noted N/A Moisture: Maceration: No Dry/Scaly: No Periwound Skin Color: Atrophie Blanche: No No Abnormalities Noted N/A Cyanosis: No Ecchymosis: No Erythema: No Hemosiderin Staining: No Mottled: No Pallor: No Rubor: No Temperature: No Abnormality N/A N/A Tenderness on Yes No N/A Palpation: Wound Preparation: Ulcer Cleansing: N/A N/A Rinsed/Irrigated with Saline Topical Anesthetic Applied: Other: lidocaine 4% Treatment Notes Electronic Signature(s) Signed: 12/20/2014 10:36:04 AM By: Regan Lemming BSN, RN Entered By: Regan Lemming on 12/20/2014 10:36:04 Philip [name] ([id number]) -------------------------------------------------------------------------------- [place] Details Patient Name: YUREM, VINER. Date of Service: 12/20/2014 10:15 AM Medical Record Number: [id number] Patient Account Number: [id number] Date of Birth/Sex: 04/04/1919 (81 y.o. Male) Treating RN: Baruch Gouty, RN, BSN, Velva Harman Primary Care Physician: Hortencia Pilar Other Clinician: Referring Physician: Hortencia Pilar Treating Physician/Extender: Frann Rider in Treatment: 62 Active Inactive Abuse / Safety / Falls / Self Care Management Nursing Diagnoses: Abuse or neglect; actual or potential Potential for falls Goals: Patient will remain injury free Date Initiated: 05/02/2014 Goal Status: Active Interventions: Assess fall risk on admission and as needed Notes: Necrotic Tissue Nursing Diagnoses: Impaired tissue integrity related to necrotic/devitalized tissue Goals: Necrotic/devitalized tissue will be minimized in the wound bed Date Initiated:  05/02/2014 Goal Status: Active Interventions: Assess patient pain level pre-, during and post procedure and prior to discharge Treatment Activities: Apply topical anesthetic as ordered : 12/20/2014 Notes: Orientation to the Wound Care Program Nursing Diagnoses: Knowledge deficit related to the wound healing center program KHALED, HERDA ([id number]) Goals: Patient/caregiver will verbalize understanding of the [place] Program Date Initiated: 05/02/2014 Goal Status: Active Interventions: Provide education on orientation to the wound center Notes: Electronic Signature(s) Signed: 12/20/2014 10:35:53 AM By: Regan Lemming BSN, RN Entered By: Regan Lemming on 12/20/2014 10:35:53 Philip [name] ([id number]) -------------------------------------------------------------------------------- Pain Assessment Details Patient Name: Philip [name]. Date of Service: 12/20/2014 10:15 AM Medical Record Number: [id number] Patient Account Number: [id number] Date of Birth/Sex: [birth year] (81 y.o. Male) Treating RN: Baruch Gouty, RN, BSN, Velva Harman Primary Care Physician: Hortencia Pilar Other Clinician: Referring Physician: Hortencia Pilar Treating Physician/Extender: Frann Rider in Treatment: 63 Active Problems Location of Pain Severity and Description of Pain Patient Has Paino No Site Locations Pain Management and Medication Current Pain Management: Electronic Signature(s) Signed: 12/20/2014 10:34:01 AM By: Regan Lemming BSN, RN Entered By: Regan Lemming on 12/20/2014 10:34:01 Philip [name] ([id number]) -------------------------------------------------------------------------------- Patient/Caregiver Education  Details Patient Name: ALVA, BROXSON. Date of Service: 12/20/2014 10:15 AM Medical Record Number: [id number] Patient Account Number: [id number] Date of Birth/Gender: Jan 12, 1919 (80 y.o. Male) Treating RN: Baruch Gouty, RN, BSN, Velva Harman Primary Care Physician: Hortencia Pilar Other  Clinician: Referring Physician: Hortencia Pilar Treating Physician/Extender: Frann Rider in Treatment: 72 Education Assessment Education Provided To: Patient Education Topics Provided Basic Hygiene: Methods: Explain/Verbal Responses: State content correctly Welcome To [facility] [place]: Methods: Explain/Verbal Responses: State content correctly Electronic Signature(s) Signed: 12/20/2014 10:48:16 AM By: Regan Lemming BSN, RN Entered By: Regan Lemming on 12/20/2014 10:48:16 Philip [name] ([id number]) -------------------------------------------------------------------------------- Wound Assessment Details Patient Name: TYQUON, NEAR. Date of Service: 12/20/2014 10:15 AM Medical Record Number: [id number] Patient Account Number: [id number] Date of Birth/Sex: 1918/07/18 (81 y.o. Male) Treating RN: Baruch Gouty, RN, BSN, Velva Harman Primary Care Physician: Hortencia Pilar Other Clinician: Referring Physician: Hortencia Pilar Treating Physician/Extender: Frann Rider in Treatment: 33 Wound Status Wound Number: 4 Primary Malignant Wound Etiology: Wound Location: Left Malleolus - Medial Wound Status: Open Wounding Event: Other Lesion Comorbid Cataracts, Arrhythmia, Congestive Date Acquired: 09/20/2014 History: Heart Failure Weeks Of Treatment: 13 Clustered Wound: No Photos Photo Uploaded By: Regan Lemming on 12/20/2014 12:29:25 Wound Measurements Length: (cm) 2.2 Width: (cm) 2 Depth: (cm) 0.4 Area: (cm) 3.456 Volume: (cm) 1.382 % Reduction in Area: -2101.3% % Reduction in Volume: -8537.5% Epithelialization: None Tunneling: No Undermining: No Wound Description Full Thickness With Exposed Classification: Support Structures Wound Margin: Distinct, outline attached Exudate Small Amount: Exudate Type: Serosanguineous Exudate Color: red, brown Foul Odor After Cleansing: No Wound Bed Granulation Amount: Small (1-33%) Exposed Structure Granulation Quality: Pink Fascia  Exposed: Yes Necrotic Amount: Medium (34-66%) Fat Layer Exposed: No ELDOR, CON[place]AY ([id number]) Necrotic Quality: Adherent Slough Tendon Exposed: No Muscle Exposed: No Joint Exposed: No Bone Exposed: No Periwound Skin Texture Texture Color No Abnormalities Noted: No No Abnormalities Noted: No Callus: No Atrophie Blanche: No Crepitus: No Cyanosis: No Excoriation: No Ecchymosis: No Fluctuance: No Erythema: No Friable: No Hemosiderin Staining: No Induration: No Mottled: No Localized Edema: Yes Pallor: No Rash: No Rubor: No Scarring: No Temperature / Pain Moisture Temperature: No Abnormality No Abnormalities Noted: No Tenderness on Palpation: Yes Dry / Scaly: No Maceration: No Moist: Yes Wound Preparation Ulcer Cleansing: Rinsed/Irrigated with Saline Topical Anesthetic Applied: Other: lidocaine 4%, Treatment Notes Wound #4 (Left, Medial Malleolus) 1. Cleansed with: Clean wound with Normal Saline 4. Dressing Applied: Hydrafera Blue 5. Secondary Dressing Applied Bordered Foam Dressing Electronic Signature(s) Signed: 12/20/2014 10:33:07 AM By: Regan Lemming BSN, RN Entered By: Regan Lemming on 12/20/2014 10:33:07 Philip [name] ([id number]) -------------------------------------------------------------------------------- Wound Assessment Details Patient Name: ROLLAN, ROGER. Date of Service: 12/20/2014 10:15 AM Medical Record Number: [id number] Patient Account Number: [id number] Date of Birth/Sex: 1918/09/10 (81 y.o. Male) Treating RN: Baruch Gouty, RN, BSN, [place] Primary Care Physician: Hortencia Pilar Other Clinician: Referring Physician: Hortencia Pilar Treating Physician/Extender: Frann Rider in Treatment: 33 Wound Status Wound Number: 5 Primary Etiology: Skin Tear Wound Location: Right, Lateral Forearm Wound Status: Healed - Epithelialized Wounding Event: Shear/Friction Date Acquired: 10/01/2014 Weeks Of Treatment: 11 Clustered Wound: No Photos Photo  Uploaded By: Regan Lemming on 12/20/2014 12:29:26 Wound Measurements Length: (cm) 0 % Reduction Width: (cm) 0 % Reduction Depth: (cm) 0 Area: (cm) 0 Volume: (cm) 0 in Area: 100% in Volume: 100% Wound Description Classification: Partial Thickness Periwound Skin Texture Texture Color No Abnormalities Noted: No No Abnormalities Noted: No Moisture No Abnormalities Noted: No Electronic Signature(s) Signed: 12/20/2014 4:12:55 PM  By: Regan Lemming BSN, RN IDA, UPPAL ([id number]) Entered By: Regan Lemming on 12/20/2014 10:32:17 ASSAD, HARBESON ([id number]) -------------------------------------------------------------------------------- [place] Details Patient Name: KATHY, WARES. Date of Service: 12/20/2014 10:15 AM Medical Record Number: [id number] Patient Account Number: [id number] Date of Birth/Sex: December 27, 1918 (81 y.o. Male) Treating RN: Baruch Gouty, RN, BSN, [place] Primary Care Physician: Hortencia Pilar Other Clinician: Referring Physician: Hortencia Pilar Treating Physician/Extender: Frann Rider in Treatment: 59 Vital Signs Time Taken: 10:28 Temperature (F): 97.7 Height (in): 69 Pulse (bpm): 66 Weight (lbs): 179 Respiratory Rate (breaths/min): 17 Body Mass Index (BMI): 26.4 Blood Pressure (mmHg): 112/55 Reference Range: 80 - 120 mg / dl Electronic Signature(s) Signed: 12/20/2014 10:29:24 AM By: Regan Lemming BSN, RN Entered By: Regan Lemming on 12/20/2014 07:22:57

## 2014-12-21 NOTE — Progress Notes (Signed)
TRENT, GABLER ([id number]) Visit Report for 12/20/2014 Chief Complaint Document Details Patient Name: Philip [name], Philip [name]. Date of Service: 12/20/2014 10:15 AM Medical Record Number: [id number] Patient Account Number: [id number] Date of Birth/Sex: [birth year] (81 y.o. Male) Treating RN: Baruch Gouty, RN, BSN, Velva Harman Primary Care Physician: Hortencia Pilar Other Clinician: Referring Physician: Hortencia Pilar Treating Physician/Extender: Frann Rider in Treatment: 44 Information Obtained from: Patient Chief Complaint R foot ulcer. L forearm ulcer. 07/19/2014 -- about 2 weeks ago he had a surgical procedure done by dermatologist in [place] and has an open surgical wound on the dorsum of the right foot. Electronic Signature(s) Signed: 12/20/2014 10:49:47 AM By: Christin Fudge MD, FACS Entered By: Christin Fudge on 12/20/2014 10:49:47 [name] ([id number]) -------------------------------------------------------------------------------- HPI Details Patient Name: Philip [name], Philip [name]. Date of Service: 12/20/2014 10:15 AM Medical Record Number: [id number] Patient Account Number: [id number] Date of Birth/Sex: [birth year] (81 y.o. Male) Treating RN: Afful, RN, BSN, [place] Primary Care Physician: Hortencia Pilar Other Clinician: Referring Physician: Hortencia Pilar Treating Physician/Extender: Frann Rider in Treatment: 61 History of Present Illness Location: right leg Duration: Dec 2015 Modifying Factors: history of an injury to the right leg with resulting hematoma and thrombophlebitis and later an ulcer of posterior leg Associated Signs and Symptoms: marked lymphedema of the right leg. He is already on Eloquis. HPI Description: 06/14/14 -- He returns for followup today. He denies any fevers. no fresh issues and his daughter says he's been doing fine. 06/21/14 -- after he sustained a fall this week earlier he applied a bandage over this himself and did not seek any medical attention. he did however  manage to control the bleeding and had a dressing in place the next morning when his son to the visit. In this dressing was removed there was further damaged skin. His right leg has been doing fine otherwise. 07/12/14 --Very pleasant 81 year old with past medical history significant for congestive heart failure (EF 15%), peripheral vascular disease, and chronic kidney disease. He was hospitalized at [facility] in December 2015 for congestive heart failure. He says that he fell during his hospital course and developed a hematoma over his right calf. He was also diagnosed with a right lower extremity DVT for which he takes Eliquis. The hematoma subsequently turned into an ulceration around Christmas, which has healed. He subsequently developed an ulcer on his right dorsal foot and a traumatic left forearm ulcer. Per his report, he underwent biopsy of the right dorsal foot ulceration which demonstrated a skin cancer. His PCP and dermatologist office are both closed today. I reviewed his records in [place] but find no report of biopsy or pathology. He s without complaints today. No significant pain. No fever or chills. Minimal drainage. 07/19/2014 - the patient and his son tell me that about 2 weeks ago the dermatologist did a skin biopsy and this was a large area on the dorsum of his right foot which was left open and no dressing instructions were recommended. Since then he has been called and told that it is a cancer and the need to do a further procedure but that will not happen until about 2 weeks from now. In the meanwhile the patient has not been taking care of his right foot. The left forearm where he had an abrasion and laceration is doing very well. 07/26/2014 -- Reports from 07/08/2014 from the dermatology group reviewed. A excision was done of a lesion located on the dorsum of the right foot and this was 1.7 cm in diameter  which was a shave biopsy performed. The wound was left open after appropriate  cauterization and the patient was given this dressing instructions. 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 08/02/2014 -- all the original problems he came with have completely resolved. He now has a surgical wound on his right foot dorsum where a skin cancer was excised. He goes to see his dermatologist this Philip [name], Philip [name] ([id number]) coming Tuesday and will have definite news next Friday. 08/09/2014 he had gone to his dermatologist on Tuesday and she has injected the base of his ulcer with some chemotherapeutic agent. He was supposed to bring some papers with him but forgot to get them and will bring them in next week. Other than that the dermatologist had suggested using Mehdi honey on the wound. 08/16/2014 -- the patient has brought in his notes from the dermatologist and on 08/06/2014 he received a injection of 5 FU, 500 mg grams into the lesion. the pathology report was also sent and it was a squamous cell carcinoma well-differentiated and edges were involved. They wanted him to use many honey for the wound dressing changes to be done 3 times a week. 08/30/2014 -- he has finished his second injection of 5-FU and has the next one in 2 weeks' time. He is doing fine otherwise. 09/13/2014 - No new complaints. No significant pain. No fever or chills. Minimal drainage. Still receiving 5-FU injections. 09/20/2014 -- He was seen by the dermatologist on 09/10/2014 and Dr. Phillip Heal injected his foot both the right on the dorsum and left near the medial malleolus with 5-FU. The next dose of 5-FU is to be given after 3 months. The patient says he now has a spot on the left medial malleolus where he was injected with 5-FU. 09/27/2014 -- the area on the left ankle where he was injected with 5-FU is now a full-blown ulcer. He also has mild pain in both ankle areas. 10/04/2014 - large had a bit of a fall and  injured his right arm last evening and has had a laceration with no evidence of any foreign body in the right forearm. 12/20/2014 -- he recently saw his dermatologist at [facility] and she was pleased with his wound healing on the right lower extremity. She has not given him any further 5-FU injections and will see him back on a when necessary basis. Electronic Signature(s) Signed: 12/20/2014 10:50:41 AM By: Christin Fudge MD, FACS Entered By: Christin Fudge on 12/20/2014 10:50:40 [name] ([id number]) -------------------------------------------------------------------------------- Physical Exam Details Patient Name: Philip [name], Philip [name]. Date of Service: 12/20/2014 10:15 AM Medical Record Number: [id number] Patient Account Number: [id number] Date of Birth/Sex: 06-13-1918 (81 y.o. Male) Treating RN: Baruch Gouty, RN, BSN, Velva Harman Primary Care Physician: Hortencia Pilar Other Clinician: Referring Physician: Hortencia Pilar Treating Physician/Extender: Frann Rider in Treatment: 33 Constitutional . Pulse regular. Respirations normal and unlabored. Afebrile. . Eyes Nonicteric. Reactive to light. Ears, Nose, Mouth, and Throat Lips, teeth, and gums WNL.Marland Kitchen Moist mucosa without lesions . Neck supple and nontender. No palpable supraclavicular or cervical adenopathy. Normal sized without goiter. Respiratory WNL. No retractions.. Cardiovascular Pedal Pulses WNL. No clubbing, cyanosis or edema. Lymphatic No adneopathy. No adenopathy. No adenopathy. Musculoskeletal Adexa without tenderness or enlargement.. Digits and nails w/o clubbing, cyanosis, infection, petechiae, ischemia, or inflammatory conditions.. Integumentary (Hair, Skin) No suspicious lesions. No crepitus or fluctuance. No peri-wound warmth or erythema. No masses.Marland Kitchen Psychiatric Judgement and insight Intact.Marland Kitchen No  evidence of depression, anxiety, or agitation.. Notes The right arm is completely healed and we will just have a protected bordered  foam over this. The right foot is also completely healed. The left medial malleolus region has a large open ulcerated area with exposure to the fascia level and we will continue to dress this with Hydrofera Blue. Electronic Signature(s) Signed: 12/20/2014 10:51:55 AM By: Christin Fudge MD, FACS Entered By: Christin Fudge on 12/20/2014 10:51:54 [name] ([id number]) -------------------------------------------------------------------------------- Physician Orders Details Patient Name: KORDELL, JAFRI. Date of Service: 12/20/2014 10:15 AM Medical Record Number: [id number] Patient Account Number: [id number] Date of Birth/Sex: March 27, 1919 (81 y.o. Male) Treating RN: Baruch Gouty, RN, BSN, Velva Harman Primary Care Physician: Hortencia Pilar Other Clinician: Referring Physician: Hortencia Pilar Treating Physician/Extender: Frann Rider in Treatment: 41 Verbal / Phone Orders: Yes Clinician: Afful, RN, BSN, Rita Read Back and Verified: Yes Diagnosis Coding Wound Cleansing Wound #4 Left,Medial Malleolus o Clean wound with wound cleanser. o Cleanse wound with mild soap and water o May Shower, gently pat wound dry prior to applying new dressing. o May shower with protection. Skin Barriers/Peri-Wound Care Wound #4 Left,Medial Malleolus o Skin Prep Primary Wound Dressing Wound #4 Left,Medial Malleolus o Hydrafera Blue Secondary Dressing Wound #4 Left,Medial Malleolus o Boardered Foam Dressing Dressing Change Frequency Wound #4 Left,Medial Malleolus o Change dressing every other day. Follow-up Appointments Wound #4 Left,Medial Malleolus o Return Appointment in 1 week. Edema Control Wound #4 Left,Medial Malleolus o Tubigrip Home Health Wound #4 [place] Visits - [place] Nurse may visit PRN to address patientos wound care needs. LYTLE, MALBURG ([id number]) o FACE TO FACE ENCOUNTER: MEDICARE and MEDICAID PATIENTS: I  certify that this patient is under my care and that I had a face-to-face encounter that meets the physician face-to-face encounter requirements with this patient on this date. The encounter with the patient was in whole or in part for the following MEDICAL CONDITION: (primary reason for [place]) MEDICAL NECESSITY: I certify, that based on my findings, NURSING services are a medically necessary home health service. HOME BOUND STATUS: I certify that my clinical findings support that this patient is homebound (i.e., Due to illness or injury, pt requires aid of supportive devices such as crutches, cane, wheelchairs, walkers, the use of special transportation or the assistance of another person to leave their place of residence. There is a normal inability to leave the home and doing so requires considerable and taxing effort. Other absences are for medical reasons / religious services and are infrequent or of short duration when for other reasons). o If current dressing causes regression in wound condition, may D/C ordered dressing product/s and apply Normal Saline Moist Dressing daily until next [place] / Other MD appointment. [place] of regression in wound condition at [phone]. o Please direct any NON-WOUND related issues/requests for orders to patient's Primary Care Physician Electronic Signature(s) Signed: 12/20/2014 10:46:37 AM By: Regan Lemming BSN, RN Signed: 12/20/2014 4:20:06 PM By: Christin Fudge MD, FACS Entered By: Regan Lemming on 12/20/2014 10:46:37 [name] ([id number]) -------------------------------------------------------------------------------- Problem List Details Patient Name: PHINEAS, MCENROE. Date of Service: 12/20/2014 10:15 AM Medical Record Number: [id number] Patient Account Number: [id number] Date of Birth/Sex: [birth year] (81 y.o. Male) Treating RN: Cornell Barman Primary Care Physician: Hortencia Pilar Other  Clinician: Referring Physician: Hortencia Pilar Treating Physician/Extender: Frann Rider in Treatment: 82 Active Problems ICD-10 Encounter Code Description Active Date Diagnosis I70.232 Atherosclerosis of  native arteries of right leg with 06/21/2014 Yes ulceration of calf I82.401 Acute embolism and thrombosis of unspecified deep veins 06/21/2014 Yes of right lower extremity S91.301A Unspecified open wound, right foot, initial encounter 07/19/2014 Yes Z92.21 Personal history of antineoplastic chemotherapy 08/23/2014 Yes L97.522 Non-pressure chronic ulcer of other part of left foot with fat 09/20/2014 Yes layer exposed S41.111A Laceration without foreign body of right upper arm, initial 10/04/2014 Yes encounter Inactive Problems Resolved Problems ICD-10 Code Description Active Date Resolved Date L97.212 Non-pressure chronic ulcer of right calf with fat layer 06/21/2014 06/21/2014 exposed S51.812A Laceration without foreign body of left forearm, initial 06/21/2014 06/21/2014 encounter Philip [name], Philip [name] ([id number]) Electronic Signature(s) Signed: 12/20/2014 10:49:40 AM By: Christin Fudge MD, FACS Entered By: Christin Fudge on 12/20/2014 10:49:40 [name] ([id number]) -------------------------------------------------------------------------------- Progress Note Details Patient Name: [name]. Date of Service: 12/20/2014 10:15 AM Medical Record Number: [id number] Patient Account Number: [id number] Date of Birth/Sex: Sep 20, 1918 (81 y.o. Male) Treating RN: Baruch Gouty, RN, BSN, Velva Harman Primary Care Physician: Hortencia Pilar Other Clinician: Referring Physician: Hortencia Pilar Treating Physician/Extender: Frann Rider in Treatment: 43 Subjective Chief Complaint Information obtained from Patient R foot ulcer. L forearm ulcer. 07/19/2014 -- about 2 weeks ago he had a surgical procedure done by dermatologist in [place] and has an open surgical wound on the dorsum of the right foot. History  of Present Illness (HPI) The following HPI elements were documented for the patient's wound: Location: right leg Duration: Dec 2015 Modifying Factors: history of an injury to the right leg with resulting hematoma and thrombophlebitis and later an ulcer of posterior leg Associated Signs and Symptoms: marked lymphedema of the right leg. He is already on Eloquis. 06/14/14 -- He returns for followup today. He denies any fevers. no fresh issues and his daughter says he's been doing fine. 06/21/14 -- after he sustained a fall this week earlier he applied a bandage over this himself and did not seek any medical attention. he did however manage to control the bleeding and had a dressing in place the next morning when his son to the visit. In this dressing was removed there was further damaged skin. His right leg has been doing fine otherwise. 07/12/14 --Very pleasant 81 year old with past medical history significant for congestive heart failure (EF 15%), peripheral vascular disease, and chronic kidney disease. He was hospitalized at [facility] in December 2015 for congestive heart failure. He says that he fell during his hospital course and developed a hematoma over his right calf. He was also diagnosed with a right lower extremity DVT for which he takes Eliquis. The hematoma subsequently turned into an ulceration around Christmas, which has healed. He subsequently developed an ulcer on his right dorsal foot and a traumatic left forearm ulcer. Per his report, he underwent biopsy of the right dorsal foot ulceration which demonstrated a skin cancer. His PCP and dermatologist office are both closed today. I reviewed his records in [place] but find no report of biopsy or pathology. He s without complaints today. No significant pain. No fever or chills. Minimal drainage. 07/19/2014 - the patient and his son tell me that about 2 weeks ago the dermatologist did a skin biopsy and this was a large area on the dorsum of  his right foot which was left open and no dressing instructions were recommended. Since then he has been called and told that it is a cancer and the need to do a further procedure but that will not happen until about 2 weeks  from now. In the meanwhile the patient has not been taking care of his right foot. The left forearm where he had an abrasion and laceration is doing very well. Philip [name], Philip [name] ([id number]) 07/26/2014 -- Reports from 07/08/2014 from the dermatology group reviewed. A excision was done of a lesion located on the dorsum of the right foot and this was 1.7 cm in diameter which was a shave biopsy performed. The wound was left open after appropriate cauterization and the patient was given this dressing instructions. 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 08/02/2014 -- all the original problems he came with have completely resolved. He now has a surgical wound on his right foot dorsum where a skin cancer was excised. He goes to see his dermatologist this coming Tuesday and will have definite news next Friday. 08/09/2014 he had gone to his dermatologist on Tuesday and she has injected the base of his ulcer with some chemotherapeutic agent. He was supposed to bring some papers with him but forgot to get them and will bring them in next week. Other than that the dermatologist had suggested using Mehdi honey on the wound. 08/16/2014 -- the patient has brought in his notes from the dermatologist and on 08/06/2014 he received a injection of 5 FU, 500 mg grams into the lesion. the pathology report was also sent and it was a squamous cell carcinoma well-differentiated and edges were involved. They wanted him to use many honey for the wound dressing changes to be done 3 times a week. 08/30/2014 -- he has finished his second injection of 5-FU and has the next one in 2 weeks' time. He is doing fine  otherwise. 09/13/2014 - No new complaints. No significant pain. No fever or chills. Minimal drainage. Still receiving 5-FU injections. 09/20/2014 -- He was seen by the dermatologist on 09/10/2014 and Dr. Phillip Heal injected his foot both the right on the dorsum and left near the medial malleolus with 5-FU. The next dose of 5-FU is to be given after 3 months. The patient says he now has a spot on the left medial malleolus where he was injected with 5-FU. 09/27/2014 -- the area on the left ankle where he was injected with 5-FU is now a full-blown ulcer. He also has mild pain in both ankle areas. 10/04/2014 - large had a bit of a fall and injured his right arm last evening and has had a laceration with no evidence of any foreign body in the right forearm. 12/20/2014 -- he recently saw his dermatologist at [facility] and she was pleased with his wound healing on the right lower extremity. She has not given him any further 5-FU injections and will see him back on a when necessary basis. Objective Constitutional Pulse regular. Respirations normal and unlabored. Afebrile. Vitals Time Taken: 10:28 AM, Height: 69 in, Weight: 179 lbs, BMI: 26.4, Temperature: 97.7 F, Pulse: 66 Philip [name], Philip F. ([id number]) bpm, Respiratory Rate: 17 breaths/min, Blood Pressure: 112/55 mmHg. Eyes Nonicteric. Reactive to light. Ears, Nose, Mouth, and Throat Lips, teeth, and gums WNL.Marland Kitchen Moist mucosa without lesions . Neck supple and nontender. No palpable supraclavicular or cervical adenopathy. Normal sized without goiter. Respiratory WNL. No retractions.. Cardiovascular Pedal Pulses WNL. No clubbing, cyanosis or edema. Lymphatic No adneopathy. No adenopathy. No adenopathy. Musculoskeletal Adexa without tenderness or enlargement.. Digits and nails w/o clubbing, cyanosis, infection, petechiae, ischemia, or inflammatory conditions.Marland Kitchen Psychiatric Judgement and insight Intact.. No evidence of depression,  anxiety, or  agitation.. General Notes: The right arm is completely healed and we will just have a protected bordered foam over this. The right foot is also completely healed. The left medial malleolus region has a large open ulcerated area with exposure to the fascia level and we will continue to dress this with Hydrofera Blue. Integumentary (Hair, Skin) No suspicious lesions. No crepitus or fluctuance. No peri-wound warmth or erythema. No masses.. Wound #4 status is Open. Original cause of wound was Other Lesion. The wound is located on the Left,Medial Malleolus. The wound measures 2.2cm length x 2cm width x 0.4cm depth; 3.456cm^2 area and 1.382cm^3 volume. There is fascia exposed. There is no tunneling or undermining noted. There is a small amount of serosanguineous drainage noted. The wound margin is distinct with the outline attached to the wound base. There is small (1-33%) pink granulation within the wound bed. There is a medium (34-66%) amount of necrotic tissue within the wound bed including Adherent Slough. The periwound skin appearance exhibited: Localized Edema, Moist. The periwound skin appearance did not exhibit: Callus, Crepitus, Excoriation, Fluctuance, Friable, Induration, Rash, Scarring, Dry/Scaly, Maceration, Atrophie Blanche, Cyanosis, Ecchymosis, Hemosiderin Staining, Mottled, Pallor, Rubor, Erythema. Periwound temperature was noted as No Abnormality. The periwound has tenderness on palpation. Wound #5 status is Healed - Epithelialized. Original cause of wound was Shear/Friction. The wound is located on the Right,Lateral Forearm. The wound measures 0cm length x 0cm width x 0cm depth; 0cm^2 area and 0cm^3 volume. Philip [name], Philip [name] ([id number]) 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 I82.401 - Acute embolism and thrombosis of unspecified deep veins of right lower extremity S91.301A - Unspecified open wound, right  foot, initial encounter Z92.21 - Personal history of antineoplastic chemotherapy L97.522 - Non-pressure chronic ulcer of other part of left foot with fat layer exposed S41.111A - Laceration without foreign body of right upper arm, initial encounter The right arm is completely healed and we will just have a protected bordered foam over this. The right foot is also completely healed. The left medial malleolus region has a large open ulcerated area with exposure to the fascia level and we will continue to dress this with Hydrofera Blue. Plan Wound Cleansing: Wound #4 Left,Medial Malleolus: Clean wound with wound cleanser. Cleanse wound with mild soap and water May Shower, gently pat wound dry prior to applying new dressing. May shower with protection. Skin Barriers/Peri-Wound Care: Wound #4 Left,Medial Malleolus: Skin Prep Primary Wound Dressing: Wound #4 Left,Medial Malleolus: Hydrafera Blue Secondary Dressing: Wound #4 Left,Medial Malleolus: Boardered Foam Dressing Dressing Change Frequency: Wound #4 Left,Medial Malleolus: Change dressing every other day. Follow-up Appointments: Wound #4 Left,Medial Malleolus: Return Appointment in 1 week. Edema Control: Wound #4 Left,Medial Malleolus: Tubigrip Philip [name], Philip [name] ([id number]) Home Health: Wound #4 Left,Medial Malleolus: Continue Home Health Visits - [place] Nurse may visit PRN to address patient s wound care needs. FACE TO FACE ENCOUNTER: MEDICARE and MEDICAID PATIENTS: I certify that this patient is under my care and that I had a face-to-face encounter that meets the physician face-to-face encounter requirements with this patient on this date. The encounter with the patient was in whole or in part for the following MEDICAL CONDITION: (primary reason for [place]) MEDICAL NECESSITY: I certify, that based on my findings, NURSING services are a medically necessary home health service. HOME BOUND STATUS: I  certify that my clinical findings support that this patient is homebound (i.e., Due to illness  or injury, pt requires aid of supportive devices such as crutches, cane, wheelchairs, walkers, the use of special transportation or the assistance of another person to leave their place of residence. There is a normal inability to leave the home and doing so requires considerable and taxing effort. Other absences are for medical reasons / religious services and are infrequent or of short duration when for other reasons). If current dressing causes regression in wound condition, may D/C ordered dressing product/s and apply Normal Saline Moist Dressing daily until next [place] [place] / Other MD appointment. [place] of regression in wound condition at [phone]. Please direct any NON-WOUND related issues/requests for orders to patient's Primary Care Physician The right arm is completely healed and we will just have a protected bordered foam over this. The right foot is also completely healed. The left medial malleolus region has a large open ulcerated area with exposure to the fascia level and we will continue to dress this with Hydrofera Blue. Electronic Signature(s) Signed: 12/20/2014 10:52:18 AM By: Christin Fudge MD, FACS Entered By: Christin Fudge on 12/20/2014 10:52:18 [name] ([id number]) -------------------------------------------------------------------------------- SuperBill Details Patient Name: Philip [name], Philip [name]. Date of Service: 12/20/2014 Medical Record Number: [id number] Patient Account Number: [id number] Date of Birth/Sex: 1919/02/03 (81 y.o. 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 S41.111A Laceration without foreign body of right upper arm, initial encounter Facility Procedures The patient participates with Medicare or their insurance follows the Medicare Facility Guidelines: CPT4 Code Description Modifier Quantity [phone] - WOUND CARE VISIT-LEV 2 EST PT 1 Physician Procedures CPT4 Code Description: [id number] - WC PHYS LEVEL 3 - EST PT ICD-10 Description Diagnosis I70.232 Atherosclerosis of native arteries of right leg with ul L97.522 Non-pressure chronic ulcer of other part of left foot w Modifier: ceration of ith fat laye Quantity: 1 calf r exposed Electronic Signature(s) Signed: 12/20/2014 10:52:46 AM By: Christin Fudge MD, FACS Entered By: Christin Fudge on 12/20/2014 10:52:46

## 2014-12-27 ENCOUNTER — Encounter: Payer: Medicare Other | Admitting: Surgery

## 2014-12-27 DIAGNOSIS — L97522 Non-pressure chronic ulcer of other part of left foot with fat layer exposed: Secondary | ICD-10-CM | POA: Diagnosis not present

## 2014-12-28 NOTE — Progress Notes (Signed)
BRACH, BIRDSALL ([id number]) Visit Report for 12/27/2014 Arrival Information Details Patient Name: Philip [name], Philip [name]. Date of Service: 12/27/2014 10:15 AM Medical Record Number: [id number] Patient Account Number: [id number] Date of Birth/Sex: Jan 06, 1919 (81 y.o. Male) Treating RN: Montey Hora Primary Care Physician: Hortencia Pilar Other Clinician: Referring Physician: Hortencia Pilar Treating Physician/Extender: Frann Rider in Treatment: 39 Visit Information History Since Last Visit Added or deleted any medications: No Patient Arrived: [place] Any new allergies or adverse reactions: No Arrival Time: 10:30 Had a fall or experienced change in No Accompanied By: dtr activities of daily living that may affect Transfer Assistance: None risk of falls: Patient Identification Verified: Yes Signs or symptoms of abuse/neglect since last No Secondary Verification Process Yes visito Completed: Hospitalized since last visit: No Patient Requires Transmission- No Pain Present Now: No Based Precautions: Patient Has Alerts: Yes Patient Alerts: Patient on Blood Thinner No ABIs dt LE blood clots Electronic Signature(s) Signed: 12/27/2014 4:53:10 PM By: Montey Hora Entered By: Montey Hora on 12/27/2014 10:31:42 [name] ([id number]) -------------------------------------------------------------------------------- Clinic Level of Care Assessment Details Patient Name: [name]. Date of Service: 12/27/2014 10:15 AM Medical Record Number: [id number] Patient Account Number: [id number] Date of Birth/Sex: 1919-03-31 (81 y.o. Male) Treating RN: Montey Hora Primary Care Physician: Hortencia Pilar Other Clinician: Referring Physician: Hortencia Pilar Treating Physician/Extender: Frann Rider in Treatment: 41 Clinic Level of Care Assessment Items TOOL 4 Quantity Score []  - Use when only an EandM is performed on FOLLOW-UP visit 0 ASSESSMENTS - Nursing Assessment /  Reassessment X - Reassessment of Co-morbidities (includes updates in patient status) 1 10 X - Reassessment of Adherence to Treatment Plan 1 5 ASSESSMENTS - Wound and Skin Assessment / Reassessment X - Simple Wound Assessment / Reassessment - one wound 1 5 []  - Complex Wound Assessment / Reassessment - multiple wounds 0 []  - Dermatologic / Skin Assessment (not related to wound area) 0 ASSESSMENTS - Focused Assessment []  - Circumferential Edema Measurements - multi extremities 0 []  - Nutritional Assessment / Counseling / Intervention 0 X - Lower Extremity Assessment (monofilament, tuning fork, pulses) 1 5 []  - Peripheral Arterial Disease Assessment (using hand held doppler) 0 ASSESSMENTS - Ostomy and/or Continence Assessment and Care []  - Incontinence Assessment and Management 0 []  - Ostomy Care Assessment and Management (repouching, etc.) 0 PROCESS - Coordination of Care X - Simple Patient / Family Education for ongoing care 1 15 []  - Complex (extensive) Patient / Family Education for ongoing care 0 []  - Staff obtains Programmer, systems, Records, Test Results / Process Orders 0 []  - Staff telephones HHA, Nursing Homes / Clarify orders / etc 0 []  - Routine Transfer to another Facility (non-emergent condition) 0 YONAEL, TULLOCH ([id number]) []  - Routine Hospital Admission (non-emergent condition) 0 []  - New Admissions / Biomedical engineer / Ordering NPWT, Apligraf, etc. 0 []  - Emergency Hospital Admission (emergent condition) 0 X - Simple Discharge Coordination 1 10 []  - Complex (extensive) Discharge Coordination 0 PROCESS - Special Needs []  - Pediatric / Minor Patient Management 0 []  - Isolation Patient Management 0 []  - Hearing / Language / Visual special needs 0 []  - Assessment of Community assistance (transportation, D/C planning, etc.) 0 []  - Additional assistance / Altered mentation 0 []  - Support Surface(s) Assessment (bed, cushion, seat, etc.) 0 INTERVENTIONS - Wound Cleansing /  Measurement X - Simple Wound Cleansing - one wound 1 5 []  - Complex Wound Cleansing - multiple wounds 0 X - Wound Imaging (photographs - any number  of wounds) 1 5 []  - Wound Tracing (instead of photographs) 0 X - Simple Wound Measurement - one wound 1 5 []  - Complex Wound Measurement - multiple wounds 0 INTERVENTIONS - Wound Dressings X - Small Wound Dressing one or multiple wounds 1 10 []  - Medium Wound Dressing one or multiple wounds 0 []  - Large Wound Dressing one or multiple wounds 0 []  - Application of Medications - topical 0 []  - Application of Medications - injection 0 INTERVENTIONS - Miscellaneous []  - External ear exam 0 OSBY, SWEETIN ([id number]) []  - Specimen Collection (cultures, biopsies, blood, body fluids, etc.) 0 []  - Specimen(s) / Culture(s) sent or taken to Lab for analysis 0 []  - Patient Transfer (multiple staff / Harrel Lemon Lift / Similar devices) 0 []  - Simple Staple / Suture removal (25 or less) 0 []  - Complex Staple / Suture removal (26 or more) 0 []  - Hypo / Hyperglycemic Management (close monitor of Blood Glucose) 0 []  - Ankle / Brachial Index (ABI) - do not check if billed separately 0 X - Vital Signs 1 5 Has the patient been seen at the hospital within the last three years: Yes Total Score: 80 Level Of Care: New/Established - Level 3 Electronic Signature(s) Signed: 12/27/2014 4:53:10 PM By: Montey Hora Entered By: Montey Hora on 12/27/2014 10:54:19 [name] ([id number]) -------------------------------------------------------------------------------- Encounter Discharge Information Details Patient Name: [name], [name]. Date of Service: 12/27/2014 10:15 AM Medical Record Number: [id number] Patient Account Number: [id number] Date of Birth/Sex: [birth year] (81 y.o. Male) Treating RN: Montey Hora Primary Care Physician: Hortencia Pilar Other Clinician: Referring Physician: Hortencia Pilar Treating Physician/Extender: Frann Rider in  Treatment: 28 Encounter Discharge Information Items Discharge Pain Level: 0 Discharge Condition: Stable Ambulatory Status: Ambulatory Discharge Destination: Home Transportation: Private Auto Accompanied By: dtr Schedule Follow-up Appointment: Yes Medication Reconciliation completed and provided to Patient/Care No Arlando Leisinger: Provided on Clinical Summary of Care: 12/27/2014 Form Type Recipient Paper Patient GT Electronic Signature(s) Signed: 12/27/2014 11:11:29 AM By: Ruthine Dose Entered By: Ruthine Dose on 12/27/2014 11:11:29 [name] ([id number]) -------------------------------------------------------------------------------- Lower Extremity Assessment Details Patient Name: REMY, VOILES. Date of Service: 12/27/2014 10:15 AM Medical Record Number: [id number] Patient Account Number: [id number] Date of Birth/Sex: [birth year] (81 y.o. Male) Treating RN: Montey Hora Primary Care Physician: Hortencia Pilar Other Clinician: Referring Physician: Hortencia Pilar Treating Physician/Extender: Frann Rider in Treatment: 57 Vascular Assessment Pulses: Posterior Tibial Dorsalis Pedis Palpable: [Left:Yes] Extremity colors, hair growth, and conditions: Extremity Color: [Left:Mottled] Hair Growth on Extremity: [Left:No] Temperature of Extremity: [Left:Warm] Capillary Refill: [Left:< 3 seconds] Toe Nail Assessment Left: Right: Thick: Yes Discolored: Yes Deformed: Yes Improper Length and Hygiene: No Electronic Signature(s) Signed: 12/27/2014 4:53:10 PM By: Montey Hora Entered By: Montey Hora on 12/27/2014 10:42:24 [name] ([id number]) -------------------------------------------------------------------------------- Multi Wound Chart Details Patient Name: [name]. Date of Service: 12/27/2014 10:15 AM Medical Record Number: [id number] Patient Account Number: [id number] Date of Birth/Sex: [birth year] (81 y.o. 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] [N/A:N/A] Comorbid History: [4:Cataracts, Arrhythmia, Congestive Heart Failure] [N/A:N/A] Date Acquired: [4:09/20/2014] [N/A:N/A] Weeks of Treatment: [4:14] [N/A:N/A] Wound Status: [4:Open] [N/A:N/A] Measurements L x W  x D 2x2.2x0.4 [N/A:N/A] (cm) Area (cm) : [4:3.456] [N/A:N/A] Volume (cm) : [4:1.382] [N/A:N/A] % Reduction in Area: [4:-2101.30%] [N/A:N/A] % Reduction in Volume: -8537.50% [N/A:N/A] Classification: [4:Full Thickness With Exposed Support Structures] [N/A:N/A] Exudate Amount: [4:Small] [N/A:N/A] Exudate Type: [4:Serosanguineous] [N/A:N/A] Exudate Color: [4:red, brown] [N/A:N/A] Wound Margin: [4:Distinct, outline attached] [N/A:N/A] Granulation Amount: [4:Small (1-33%)] [N/A:N/A] Granulation Quality: [4:Pink] [N/A:N/A] Necrotic Amount: [4:Medium (34-66%)] [N/A:N/A] Exposed Structures: [4:Fascia: Yes Fat: No Tendon: No Muscle: No] [N/A:N/A] Joint: No Bone: No Epithelialization: None N/A N/A Periwound Skin Texture: Edema: Yes N/A N/A Excoriation: No Induration: No Callus: No Crepitus: No Fluctuance: No Friable: No Rash: No Scarring: No Periwound Skin Moist: Yes N/A N/A Moisture: Maceration: No Dry/Scaly: No Periwound Skin Color: Erythema: Yes N/A N/A Atrophie Blanche: No Cyanosis: No Ecchymosis: No Hemosiderin Staining: No Mottled: No Pallor: No Rubor: No Erythema Location: Circumferential N/A N/A Temperature: No Abnormality N/A N/A Tenderness on Yes N/A  N/A Palpation: Wound Preparation: Ulcer Cleansing: N/A N/A Rinsed/Irrigated with Saline Topical Anesthetic Applied: Other: lidocaine 4% Treatment Notes Electronic Signature(s) Signed: 12/27/2014 4:53:10 PM By: Montey Hora Entered By: Montey Hora on 12/27/2014 10:43:17 [name] ([id number]) -------------------------------------------------------------------------------- [place] Details Patient Name: TRIGO, WINTERBOTTOM. Date of Service: 12/27/2014 10:15 AM Medical Record Number: [id number] Patient Account Number: [id number] Date of Birth/Sex: [birth year] (81 y.o. Male) Treating RN: Montey Hora Primary Care Physician: Hortencia Pilar Other Clinician: Referring Physician: Hortencia Pilar Treating Physician/Extender: Frann Rider in Treatment: 6 Active Inactive Abuse / Safety / Falls / Self Care Management Nursing Diagnoses: Abuse or neglect; actual or potential Potential for falls Goals: Patient will remain injury free Date Initiated: 05/02/2014 Goal Status: Active Interventions: Assess fall risk on admission and as needed Notes: Necrotic Tissue Nursing Diagnoses: Impaired tissue integrity related to necrotic/devitalized tissue Goals: Necrotic/devitalized tissue will be minimized in the wound bed Date Initiated: 05/02/2014 Goal Status: Active Interventions: Assess patient pain level pre-, during and post procedure and prior to discharge Treatment Activities: Apply topical anesthetic as ordered : 12/27/2014 Notes: Orientation to the Wound Care Program Nursing Diagnoses: Knowledge deficit related to the wound healing center program SELASSIE, SPATAFORE ([id number]) Goals: Patient/caregiver will verbalize understanding of the [facility] Program Date Initiated: 05/02/2014 Goal Status: Active Interventions: Provide education on orientation to the wound center Notes: Electronic Signature(s) Signed: 12/27/2014 4:53:10 PM By: Montey Hora Entered By: Montey Hora on 12/27/2014 10:42:38 [name] ([id number]) -------------------------------------------------------------------------------- Patient/Caregiver Education Details Patient Name: HERSCHELL, VIRANI. Date of Service: 12/27/2014 10:15 AM Medical Record Number: [id number] Patient Account Number: [id number] Date of Birth/Gender: 02-16-1919 (81 y.o. Male) Treating RN: Montey Hora Primary Care Physician: Hortencia Pilar Other Clinician: Referring Physician: Hortencia Pilar Treating Physician/Extender: Frann Rider in Treatment: 76 Education Assessment Education Provided To: Patient and Caregiver Education Topics Provided Wound/Skin Impairment: Handouts: Other: wound care as rdered Methods: Demonstration, Explain/Verbal Responses: State content correctly Electronic Signature(s) Signed: 12/27/2014 4:53:10 PM By: Montey Hora Entered By: Montey Hora on 12/27/2014 11:06:26 [name] ([id number]) -------------------------------------------------------------------------------- Wound Assessment Details Patient Name: ASHLY, GOETHE. Date of Service: 12/27/2014 10:15 AM Medical Record Number: [id number] Patient Account Number: [id number] Date of Birth/Sex: [birth year] (81 y.o. Male) Treating RN: Montey Hora Primary Care Physician: Hortencia Pilar Other Clinician: Referring Physician: Hortencia Pilar Treating Physician/Extender: Frann Rider in Treatment: 34 Wound Status Wound Number: 4 Primary Malignant Wound Etiology: Wound Location: Left, Medial Malleolus Wound Status: Open Wounding Event: Other Lesion Comorbid Cataracts, Arrhythmia, Congestive Date Acquired: 09/20/2014 History: Heart Failure Weeks  Of Treatment: 14 Clustered Wound: No Photos Photo Uploaded By: Montey Hora on 12/27/2014 13:16:33 Wound Measurements Length: (cm) 2 Width: (cm) 2.2 Depth: (cm) 0.3 Area: (cm) 3.456 Volume: (cm) 1.037 % Reduction in Area:  -2101.3% % Reduction in Volume: -6381.2% Epithelialization: None Tunneling: No Undermining: No Wound Description Full Thickness With Exposed Classification: Support Structures Wound Margin: Distinct, outline attached Exudate Small Amount: Exudate Type: Serosanguineous Exudate Color: red, brown Foul Odor After Cleansing: No Wound Bed Granulation Amount: Small (1-33%) Exposed Structure Granulation Quality: Pink Fascia Exposed: Yes Necrotic Amount: Medium (34-66%) Fat Layer Exposed: No ALSON, MCPHEETERS ([id number]) Necrotic Quality: Adherent Slough Tendon Exposed: No Muscle Exposed: No Joint Exposed: No Bone Exposed: No Periwound Skin Texture Texture Color No Abnormalities Noted: No No Abnormalities Noted: No Callus: No Atrophie Blanche: No Crepitus: No Cyanosis: No Excoriation: No Ecchymosis: No Fluctuance: No Erythema: Yes Friable: No Erythema Location: Circumferential Induration: No Hemosiderin Staining: No Localized Edema: Yes Mottled: No Rash: No Pallor: No Scarring: No Rubor: No Moisture Temperature / Pain No Abnormalities Noted: No Temperature: No Abnormality Dry / Scaly: No Tenderness on Palpation: Yes Maceration: No Moist: Yes Wound Preparation Ulcer Cleansing: Rinsed/Irrigated with Saline Topical Anesthetic Applied: Other: lidocaine 4%, Treatment Notes Wound #4 (Left, Medial Malleolus) 1. Cleansed with: Clean wound with Normal Saline 2. Anesthetic Topical Lidocaine 4% cream to wound bed prior to debridement 3. Peri-wound Care: Skin Prep 4. Dressing Applied: Hydrafera Blue 5. Secondary Dressing Applied Bordered Foam Dressing Notes drawtex and BFD on right arm Electronic Signature(s) Signed: 12/27/2014 4:53:10 PM By: Montey Hora Entered By: Montey Hora on 12/27/2014 10:49:56 DARREON, LUTES ([id number]) CARMINO, OCAIN ([id number]) -------------------------------------------------------------------------------- [place]  Details Patient Name: MUAD, NOGA. Date of Service: 12/27/2014 10:15 AM Medical Record Number: [id number] Patient Account Number: [id number] Date of Birth/Sex: 1919-02-25 (81 y.o. Male) Treating RN: Montey Hora Primary Care Physician: Hortencia Pilar Other Clinician: Referring Physician: Hortencia Pilar Treating Physician/Extender: Frann Rider in Treatment: 78 Vital Signs Time Taken: 10:31 Temperature (F): 97.8 Height (in): 69 Pulse (bpm): 60 Weight (lbs): 179 Respiratory Rate (breaths/min): 18 Body Mass Index (BMI): 26.4 Blood Pressure (mmHg): 146/42 Reference Range: 80 - 120 mg / dl Electronic Signature(s) Signed: 12/27/2014 4:53:10 PM By: Montey Hora Entered By: Montey Hora on 12/27/2014 10:33:20

## 2014-12-28 NOTE — Progress Notes (Signed)
JASPER, Philip ([id number]) Visit Report for 12/27/2014 Chief Complaint Document Details Patient Name: Philip [name], Philip [name]. Date of Service: 12/27/2014 10:15 AM Medical Record Number: [id number] Patient Account Number: [id number] Date of Birth/Sex: August 15, 1918 (81 y.o. Male) Treating RN: Cornell Barman Primary Care Physician: Hortencia Pilar Other Clinician: Referring Physician: Hortencia Pilar Treating Physician/Extender: Frann Rider in Treatment: 43 Information Obtained from: Patient Chief Complaint R foot ulcer. L forearm ulcer. 07/19/2014 -- about 2 weeks ago he had a surgical procedure done by dermatologist in [place] and has an open surgical wound on the dorsum of the right foot. Electronic SignatureTurners) Signed: 12/27/2014 11:19:18 AM By: Christin Fudge MD, FACS Entered By: Christin Fudge on 12/27/2014 11:19:18 Philip [name]) -------------------------------------------------------------------------------- HPI Details Patient Name: Philip [name], Philip [name]. Date of Service: 12/27/2014 10:15 AM Medical Record Number: [id number] Patient Account Number: [id number] Date of Birth/Sex: 08-Jun-1918 (81 y.o. Male) Treating RN: Cornell Barman Primary Care Physician: Hortencia Pilar Other Clinician: Referring Physician: Hortencia Pilar Treating Physician/Extender: Frann Rider in Treatment: 64 History of Present Illness Location: right leg Duration: Dec 2015 Modifying Factors: history of an injury to the right leg with resulting hematoma and thrombophlebitis and later an ulcer of posterior leg Associated Signs and Symptoms: marked lymphedema of the right leg. He is already on Eloquis. HPI Description: 06/14/14 -- He returns for followup today. He denies any fevers. no fresh issues and his daughter says he's been doing fine. 06/21/14 -- after he sustained a fall this week earlier he applied a bandage over this himself and did not seek any medical attention. he did however manage to control the  bleeding and had a dressing in place the next morning when his son to the visit. In this dressing was removed there was further damaged skin. His right leg has been doing fine otherwise. 07/12/14 --Very pleasant 81 year old with past medical history significant for congestive heart failure (EF 15%), peripheral vascular disease, and chronic kidney disease. He was hospitalized at [facility] in December 2015 for congestive heart failure. He says that he fell during his hospital course and developed a hematoma over his right calf. He was also diagnosed with a right lower extremity DVT for which he takes Eliquis. The hematoma subsequently turned into an ulceration around Christmas, which has healed. He subsequently developed an ulcer on his right dorsal foot and a traumatic left forearm ulcer. Per his report, he underwent biopsy of the right dorsal foot ulceration which demonstrated a skin cancer. His PCP and dermatologist office are both closed today. I reviewed his records in [place] but find no report of biopsy or pathology. He s without complaints today. No significant pain. No fever or chills. Minimal drainage. 07/19/2014 - the patient and his son tell me that about 2 weeks ago the dermatologist did a skin biopsy and this was a large area on the dorsum of his right foot which was left open and no dressing instructions were recommended. Since then he has been called and told that it is a cancer and the need to do a further procedure but that will not happen until about 2 weeks from now. In the meanwhile the patient has not been taking care of his right foot. The left forearm where he had an abrasion and laceration is doing very well. 07/26/2014 -- Reports from 07/08/2014 from the dermatology group reviewed. A excision was done of a lesion located on the dorsum of the right foot and this was 1.7 cm in diameter which was a shave  biopsy performed. The wound was left open after appropriate cauterization and the  patient was given this dressing instructions. 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 08/02/2014 -- all the original problems he came with have completely resolved. He now has a surgical wound on his right foot dorsum where a skin cancer was excised. He goes to see his dermatologist this Philip [name], Philip ([id number]) coming Tuesday and will have definite news next Friday. 08/09/2014 he had gone to his dermatologist on Tuesday and she has injected the base of his ulcer with some chemotherapeutic agent. He was supposed to bring some papers with him but forgot to get them and will bring them in next week. Other than that the dermatologist had suggested using Mehdi honey on the wound. 08/16/2014 -- the patient has brought in his notes from the dermatologist and on 08/06/2014 he received a injection of 5 FU, 500 mg grams into the lesion. the pathology report was also sent and it was a squamous cell carcinoma well-differentiated and edges were involved. They wanted him to use many honey for the wound dressing changes to be done 3 times a week. 08/30/2014 -- he has finished his second injection of 5-FU and has the next one in 2 weeks' time. He is doing fine otherwise. 09/13/2014 - No new complaints. No significant pain. No fever or chills. Minimal drainage. Still receiving 5-FU injections. 09/20/2014 -- He was seen by the dermatologist on 09/10/2014 and Dr. Phillip Heal injected his foot both the right on the dorsum and left near the medial malleolus with 5-FU. The next dose of 5-FU is to be given after 3 months. The patient says he now has a spot on the left medial malleolus where he was injected with 5-FU. 09/27/2014 -- the area on the left ankle where he was injected with 5-FU is now a full-blown ulcer. He also has mild pain in both ankle areas. 10/04/2014 - large had a bit of a fall and injured his right arm  last evening and has had a laceration with no evidence of any foreign body in the right forearm. 12/20/2014 -- he recently saw his dermatologist at [facility] and she was pleased with his wound healing on the right lower extremity. She has not given him any further 5-FU injections and will see him back on a when necessary basis. Electronic SignatureTurners) Signed: 12/27/2014 11:19:24 AM By: Christin Fudge MD, FACS Entered By: Christin Fudge on 12/27/2014 11:19:24 Philip [name]) -------------------------------------------------------------------------------- Physical Exam Details Patient Name: Philip [name], MOLLETT. Date of Service: 12/27/2014 10:15 AM Medical Record Number: [id number] Patient Account Number: [id number] Date of Birth/Sex: 1918/07/09 (81 y.o. Male) Treating RN: Cornell Barman Primary Care Physician: Hortencia Pilar Other Clinician: Referring Physician: Hortencia Pilar Treating Physician/Extender: Frann Rider in Treatment: 34 Constitutional . Pulse regular. Respirations normal and unlabored. Afebrile. . Eyes Nonicteric. Reactive to light. Ears, Nose, Mouth, and Throat Lips, teeth, and gums WNL.Marland Kitchen Moist mucosa without lesions . Neck supple and nontender. No palpable supraclavicular or cervical adenopathy. Normal sized without goiter. Respiratory WNL. No retractions.. Cardiovascular Pedal Pulses WNL. No clubbing, cyanosis or edema. Lymphatic No adneopathy. No adenopathy. No adenopathy. Musculoskeletal Adexa without tenderness or enlargement.. Digits and nails w/o clubbing, cyanosis, infection, petechiae, ischemia, or inflammatory conditions.. Integumentary (Hair, Skin) No suspicious lesions. No crepitus or fluctuance. No peri-wound warmth or erythema. No masses.Marland Kitchen Psychiatric Judgement and insight Intact.. No evidence of depression, anxiety, or agitation.Marland Kitchen  Notes the right thumb continues to have some areas which was a bit but other than that there is no open  wounds. The left medial malleolus now has some fascia visible but there is healthy granulation tissue. At this stage he may benefit from a skin substitute. Electronic SignatureTurners) Signed: 12/27/2014 11:20:20 AM By: Christin Fudge MD, FACS Entered By: Christin Fudge on 12/27/2014 11:20:19 Philip [name]) -------------------------------------------------------------------------------- Physician Orders Details Patient Name: Philip [name], SHROPSHIRE. Date of Service: 12/27/2014 10:15 AM Medical Record Number: [id number] Patient Account Number: [id number] Date of Birth/Sex: 11/23/1918 (81 y.o. Male) Treating RN: Montey Hora Primary Care Physician: Hortencia Pilar Other Clinician: Referring Physician: Hortencia Pilar Treating Physician/Extender: Frann Rider in Treatment: 44 Verbal / Phone Orders: Yes Clinician: Montey Hora Read Back and Verified: Yes Diagnosis Coding Wound Cleansing Wound #4 Left,Medial Malleolus o Clean wound with wound cleanser. o Cleanse wound with mild soap and water o May Shower, gently pat wound dry prior to applying new dressing. o May shower with protection. Skin Barriers/Peri-Wound Care Wound #4 Left,Medial Malleolus o Skin Prep Primary Wound Dressing Wound #4 Left,Medial Malleolus o Hydrafera Blue Secondary Dressing Wound #4 Left,Medial Malleolus o Boardered Foam Dressing Dressing Change Frequency Wound #4 Left,Medial Malleolus o Change dressing every other day. Follow-up Appointments Wound #4 Left,Medial Malleolus o Return Appointment in 1 week. Edema Control Wound #4 Left,Medial Malleolus o Tubigrip Home Health Wound #4 [place] Visits - [place] Nurse may visit PRN to address patientos wound care needs. Philip [name], Philip ([id number]) o FACE TO FACE ENCOUNTER: MEDICARE and MEDICAID PATIENTS: I certify that this patient is under my care and that I had a  face-to-face encounter that meets the physician face-to-face encounter requirements with this patient on this date. The encounter with the patient was in whole or in part for the following MEDICAL CONDITION: (primary reason for [place]) MEDICAL NECESSITY: I certify, that based on my findings, NURSING services are a medically necessary home health service. HOME BOUND STATUS: I certify that my clinical findings support that this patient is homebound (i.e., Due to illness or injury, pt requires aid of supportive devices such as crutches, cane, wheelchairs, walkers, the use of special transportation or the assistance of another person to leave their place of residence. There is a normal inability to leave the home and doing so requires considerable and taxing effort. Other absences are for medical reasons / religious services and are infrequent or of short duration when for other reasons). o If current dressing causes regression in wound condition, may D/C ordered dressing product/s and apply Normal Saline Moist Dressing daily until next [place] / Other MD appointment. [place] of regression in wound condition at [phone]. o Please direct any NON-WOUND related issues/requests for orders to patient's Primary Care Physician Notes drawtex and BFD on raw area on right arm Electronic SignatureTurners) Signed: 12/27/2014 4:37:26 PM By: Christin Fudge MD, FACS Signed: 12/27/2014 4:53:10 PM By: Montey Hora Entered By: Montey Hora on 12/27/2014 10:55:12 Philip [name]) -------------------------------------------------------------------------------- Problem List Details Patient Name: YUEPHENG, SCHALLER. Date of Service: 12/27/2014 10:15 AM Medical Record Number: [id number] Patient Account Number: [id number] Date of Birth/Sex: February 20, 1919 (81 y.o. Male) Treating RN: Cornell Barman Primary Care Physician: Hortencia Pilar Other Clinician: Referring  Physician: Hortencia Pilar Treating Physician/Extender: Frann Rider in Treatment: 52 Active Problems ICD-10 Encounter Code Description Active Date Diagnosis I70.232 Atherosclerosis of native arteries of right leg with 06/21/2014  Yes ulceration of calf I82.401 Acute embolism and thrombosis of unspecified deep veins 06/21/2014 Yes of right lower extremity S91.301A Unspecified open wound, right foot, initial encounter 07/19/2014 Yes Z92.21 Personal history of antineoplastic chemotherapy 08/23/2014 Yes L97.522 Non-pressure chronic ulcer of other part of left foot with fat 09/20/2014 Yes layer exposed S41.111A Laceration without foreign body of right upper arm, initial 10/04/2014 Yes encounter Inactive Problems Resolved Problems ICD-10 Code Description Active Date Resolved Date L97.212 Non-pressure chronic ulcer of right calf with fat layer 06/21/2014 06/21/2014 exposed S51.812A Laceration without foreign body of left forearm, initial 06/21/2014 06/21/2014 encounter Philip [name], Philip ([id number]) Electronic SignatureTurners) Signed: 12/27/2014 11:19:12 AM By: Christin Fudge MD, FACS Entered By: Christin Fudge on 12/27/2014 11:19:12 Philip [name]) -------------------------------------------------------------------------------- Progress Note Details Patient Name: Philip [name]. Date of Service: 12/27/2014 10:15 AM Medical Record Number: [id number] Patient Account Number: [id number] Date of Birth/Sex: 1918-07-18 (81 y.o. Male) Treating RN: Cornell Barman Primary Care Physician: Hortencia Pilar Other Clinician: Referring Physician: Hortencia Pilar Treating Physician/Extender: Frann Rider in Treatment: 27 Subjective Chief Complaint Information obtained from Patient R foot ulcer. L forearm ulcer. 07/19/2014 -- about 2 weeks ago he had a surgical procedure done by dermatologist in [place] and has an open surgical wound on the dorsum of the right foot. History of Present Illness (HPI) The  following HPI elements were documented for the patient's wound: Location: right leg Duration: Dec 2015 Modifying Factors: history of an injury to the right leg with resulting hematoma and thrombophlebitis and later an ulcer of posterior leg Associated Signs and Symptoms: marked lymphedema of the right leg. He is already on Eloquis. 06/14/14 -- He returns for followup today. He denies any fevers. no fresh issues and his daughter says he's been doing fine. 06/21/14 -- after he sustained a fall this week earlier he applied a bandage over this himself and did not seek any medical attention. he did however manage to control the bleeding and had a dressing in place the next morning when his son to the visit. In this dressing was removed there was further damaged skin. His right leg has been doing fine otherwise. 07/12/14 --Very pleasant 81 year old with past medical history significant for congestive heart failure (EF 15%), peripheral vascular disease, and chronic kidney disease. He was hospitalized at [facility] in December 2015 for congestive heart failure. He says that he fell during his hospital course and developed a hematoma over his right calf. He was also diagnosed with a right lower extremity DVT for which he takes Eliquis. The hematoma subsequently turned into an ulceration around Christmas, which has healed. He subsequently developed an ulcer on his right dorsal foot and a traumatic left forearm ulcer. Per his report, he underwent biopsy of the right dorsal foot ulceration which demonstrated a skin cancer. His PCP and dermatologist office are both closed today. I reviewed his records in [place] but find no report of biopsy or pathology. He s without complaints today. No significant pain. No fever or chills. Minimal drainage. 07/19/2014 - the patient and his son tell me that about 2 weeks ago the dermatologist did a skin biopsy and this was a large area on the dorsum of his right foot which was left  open and no dressing instructions were recommended. Since then he has been called and told that it is a cancer and the need to do a further procedure but that will not happen until about 2 weeks from now. In the meanwhile the patient has not  been taking care of his right foot. The left forearm where he had an abrasion and laceration is doing very well. MALACHAI, SCHALK ([id number]) 07/26/2014 -- Reports from 07/08/2014 from the dermatology group reviewed. A excision was done of a lesion located on the dorsum of the right foot and this was 1.7 cm in diameter which was a shave biopsy performed. The wound was left open after appropriate cauterization and the patient was given this dressing instructions. 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 08/02/2014 -- all the original problems he came with have completely resolved. He now has a surgical wound on his right foot dorsum where a skin cancer was excised. He goes to see his dermatologist this coming Tuesday and will have definite news next Friday. 08/09/2014 he had gone to his dermatologist on Tuesday and she has injected the base of his ulcer with some chemotherapeutic agent. He was supposed to bring some papers with him but forgot to get them and will bring them in next week. Other than that the dermatologist had suggested using Mehdi honey on the wound. 08/16/2014 -- the patient has brought in his notes from the dermatologist and on 08/06/2014 he received a injection of 5 FU, 500 mg grams into the lesion. the pathology report was also sent and it was a squamous cell carcinoma well-differentiated and edges were involved. They wanted him to use many honey for the wound dressing changes to be done 3 times a week. 08/30/2014 -- he has finished his second injection of 5-FU and has the next one in 2 weeks' time. He is doing fine otherwise. 09/13/2014 - No new  complaints. No significant pain. No fever or chills. Minimal drainage. Still receiving 5-FU injections. 09/20/2014 -- He was seen by the dermatologist on 09/10/2014 and Dr. Phillip Heal injected his foot both the right on the dorsum and left near the medial malleolus with 5-FU. The next dose of 5-FU is to be given after 3 months. The patient says he now has a spot on the left medial malleolus where he was injected with 5-FU. 09/27/2014 -- the area on the left ankle where he was injected with 5-FU is now a full-blown ulcer. He also has mild pain in both ankle areas. 10/04/2014 - large had a bit of a fall and injured his right arm last evening and has had a laceration with no evidence of any foreign body in the right forearm. 12/20/2014 -- he recently saw his dermatologist at [facility] and she was pleased with his wound healing on the right lower extremity. She has not given him any further 5-FU injections and will see him back on a when necessary basis. Objective Constitutional Pulse regular. Respirations normal and unlabored. Afebrile. Vitals Time Taken: 10:31 AM, Height: 69 in, Weight: 179 lbs, BMI: 26.4, Temperature: 97.8 F, Pulse: 60 Voyles, Thelonious F. ([id number]) bpm, Respiratory Rate: 18 breaths/min, Blood Pressure: 146/42 mmHg. Eyes Nonicteric. Reactive to light. Ears, Nose, Mouth, and Throat Lips, teeth, and gums WNL.Marland Kitchen Moist mucosa without lesions . Neck supple and nontender. No palpable supraclavicular or cervical adenopathy. Normal sized without goiter. Respiratory WNL. No retractions.. Cardiovascular Pedal Pulses WNL. No clubbing, cyanosis or edema. Lymphatic No adneopathy. No adenopathy. No adenopathy. Musculoskeletal Adexa without tenderness or enlargement.. Digits and nails w/o clubbing, cyanosis, infection, petechiae, ischemia, or inflammatory conditions.Marland Kitchen Psychiatric Judgement and insight Intact.. No evidence of depression, anxiety, or agitation.. General Notes: the right  thumb  continues to have some areas which was a bit but other than that there is no open wounds. The left medial malleolus now has some fascia visible but there is healthy granulation tissue. At this stage he may benefit from a skin substitute. Integumentary (Hair, Skin) No suspicious lesions. No crepitus or fluctuance. No peri-wound warmth or erythema. No masses.. Wound #4 status is Open. Original cause of wound was Other Lesion. The wound is located on the Left,Medial Malleolus. The wound measures 2cm length x 2.2cm width x 0.3cm depth; 3.456cm^2 area and 1.037cm^3 volume. There is fascia exposed. There is no tunneling or undermining noted. There is a small amount of serosanguineous drainage noted. The wound margin is distinct with the outline attached to the wound base. There is small (1-33%) pink granulation within the wound bed. There is a medium (34-66%) amount of necrotic tissue within the wound bed including Adherent Slough. The periwound skin appearance exhibited: Localized Edema, Moist, Erythema. The periwound skin appearance did not exhibit: Callus, Crepitus, Excoriation, Fluctuance, Friable, Induration, Rash, Scarring, Dry/Scaly, Maceration, Atrophie Blanche, Cyanosis, Ecchymosis, Hemosiderin Staining, Mottled, Pallor, Rubor. The surrounding wound skin color is noted with erythema which is circumferential. Periwound temperature was noted as No Abnormality. The periwound has tenderness on palpation. RAJOHN, HENERY ([id number]) 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 I82.401 - Acute embolism and thrombosis of unspecified deep veins of right lower extremity S91.301A - Unspecified open wound, right foot, initial encounter Z92.21 - Personal history of antineoplastic chemotherapy L97.522 - Non-pressure chronic ulcer of other part of left foot with fat layer exposed S41.111A - Laceration without foreign body of  right upper arm, initial encounter Having treated this wound for several weeks and now having some healthy granulation tissue but still some fascia visible deep I'm going to recommend Grafix skin substitute and we will see if we can get this qualified through his insurance company. Until then we will continue to use Hydrofera Blue and a local bordered foam. We have also discussed nutrition and vitamin supplements with appropriate micronutrients. He will come back and see as next week. Plan Wound Cleansing: Wound #4 Left,Medial Malleolus: Clean wound with wound cleanser. Cleanse wound with mild soap and water May Shower, gently pat wound dry prior to applying new dressing. May shower with protection. Skin Barriers/Peri-Wound Care: Wound #4 Left,Medial Malleolus: Skin Prep Primary Wound Dressing: Wound #4 Left,Medial Malleolus: Hydrafera Blue Secondary Dressing: Wound #4 Left,Medial Malleolus: Boardered Foam Dressing Dressing Change Frequency: Wound #4 Left,Medial Malleolus: Change dressing every other day. Follow-up Appointments: Wound #4 Left,Medial Malleolus: Return Appointment in 1 week. ZHANE, BLUITT ([id number]) Edema Control: Wound #4 Left,Medial Malleolus: Tubigrip Home Health: Wound #4 Left,Medial Malleolus: Continue Home Health Visits - [place] Nurse may visit PRN to address patient s wound care needs. FACE TO FACE ENCOUNTER: MEDICARE and MEDICAID PATIENTS: I certify that this patient is under my care and that I had a face-to-face encounter that meets the physician face-to-face encounter requirements with this patient on this date. The encounter with the patient was in whole or in part for the following MEDICAL CONDITION: (primary reason for [place]) MEDICAL NECESSITY: I certify, that based on my findings, NURSING services are a medically necessary home health service. HOME BOUND STATUS: I certify that my clinical findings support that this  patient is homebound (i.e., Due to illness or injury, pt requires aid of supportive devices such as crutches, cane, wheelchairs, walkers, the  use of special transportation or the assistance of another person to leave their place of residence. There is a normal inability to leave the home and doing so requires considerable and taxing effort. Other absences are for medical reasons / religious services and are infrequent or of short duration when for other reasons). If current dressing causes regression in wound condition, may D/C ordered dressing product/s and apply Normal Saline Moist Dressing daily until next [place] / Other MD appointment. [place] of regression in wound condition at [phone]. Please direct any NON-WOUND related issues/requests for orders to patient's Primary Care Physician General Notes: drawtex and BFD on raw area on right arm Having treated this wound for several weeks and now having some healthy granulation tissue but still some fascia visible deep I'm going to recommend Grafix skin substitute and we will see if we can get this qualified through his insurance company. Until then we will continue to use Hydrofera Blue and a local bordered foam. We have also discussed nutrition and vitamin supplements with appropriate micronutrients. He will come back and see as next week. [employer]) Signed: 12/27/2014 11:22:01 AM By: Christin Fudge MD, FACS Entered By: Christin Fudge on 12/27/2014 11:22:01 Philip [name]) -------------------------------------------------------------------------------- SuperBill Details Patient Name: TANDY, LEWIN. Date of Service: 12/27/2014 Medical Record Number: [id number] Patient Account Number: [id number] Date of Birth/Sex: 1918/06/08 (81 y.o. 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 S41.111A Laceration without foreign body of right upper arm, initial encounter Facility Procedures The patient participates with Medicare or their insurance follows the Medicare Facility Guidelines: CPT4 Code Description Modifier Quantity [id number] [place] VISIT-LEV 3 EST PT 1 Physician Procedures CPT4: Description Modifier Quantity Code 1916606 [id number] - WC PHYS LEVEL 3 - EST PT 1 ICD-10 Description Diagnosis I70.232 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

## 2015-01-03 ENCOUNTER — Encounter: Payer: Medicare Other | Admitting: Surgery

## 2015-01-03 DIAGNOSIS — L97522 Non-pressure chronic ulcer of other part of left foot with fat layer exposed: Secondary | ICD-10-CM | POA: Diagnosis not present

## 2015-01-04 NOTE — Progress Notes (Signed)
AMORI, COLOMB ([id number]) Visit Report for 01/03/2015 Chief Complaint Document Details Patient Name: Philip [name], Philip [name]. Date of Service: 01/03/2015 10:15 AM Medical Record Number: [id number] Patient Account Number: [id number] Date of Birth/Sex: 07-16-1918 (81 y.o. Male) Treating RN: Cornell Barman Primary Care Physician: Hortencia Pilar Other Clinician: Referring Physician: Hortencia Pilar Treating Physician/Extender: Frann Rider in Treatment: 64 Information Obtained from: Patient Chief Complaint R foot ulcer. L forearm ulcer. 07/19/2014 -- about 2 weeks ago he had a surgical procedure done by dermatologist in [place] and has an open surgical wound on the dorsum of the right foot. Electronic Signature(s) Signed: 01/03/2015 11:01:47 AM By: Christin Fudge MD, FACS Entered By: Christin Fudge on 01/03/2015 11:01:47 Philip [name] ([id number]) -------------------------------------------------------------------------------- Debridement Details Patient Name: Philip [name], Philip [name]. Date of Service: 01/03/2015 10:15 AM Medical Record Number: [id number] Patient Account Number: [id number] Date of Birth/Sex: Mar 21, 1919 (81 y.o. Male) Treating RN: Cornell Barman Primary Care Physician: Hortencia Pilar Other Clinician: Referring Physician: Hortencia Pilar Treating Physician/Extender: Frann Rider in Treatment: 35 Debridement Performed for Wound #4 Left,Medial Malleolus Assessment: Performed By: Physician Pat Patrick., MD Debridement: Open Wound/Selective Debridement Selective Description: Pre-procedure Yes Verification/Time Out Taken: Start Time: 10:42 Pain Control: Other : lidocaine 4% Level: Non-Viable Tissue Total Area Debrided (L x 2 (cm) x 2.2 (cm) = 4.4 (cm) W): Tissue and other Viable, Non-Viable, Exudate, Fibrin/Slough, Subcutaneous material debrided: Instrument: Curette Bleeding: Moderate Hemostasis Achieved: Pressure End Time: 10:47 Procedural Pain: 0 Post Procedural  Pain: 0 Response to Treatment: Procedure was tolerated well Post Debridement Measurements of Total Wound Length: (cm) 2 Width: (cm) 2.2 Depth: (cm) 0.4 Volume: (cm) 1.382 Post Procedure Diagnosis Same as Pre-procedure Electronic Signature(s) Signed: 01/03/2015 11:01:38 AM By: Christin Fudge MD, FACS Signed: 01/03/2015 5:32:35 PM By: Gretta Cool RN, BSN, Kim RN, BSN Entered By: Christin Fudge on 01/03/2015 11:01:38 Philip [name] ([id number]) -------------------------------------------------------------------------------- HPI Details Patient Name: Philip [name], Philip [name]. Date of Service: 01/03/2015 10:15 AM Medical Record Number: [id number] Patient Account Number: [id number] Date of Birth/Sex: Jun 08, 1918 (81 y.o. Male) Treating RN: Cornell Barman Primary Care Physician: Hortencia Pilar Other Clinician: Referring Physician: Hortencia Pilar Treating Physician/Extender: Frann Rider in Treatment: 36 History of Present Illness Location: right leg Duration: Dec 2015 Modifying Factors: history of an injury to the right leg with resulting hematoma and thrombophlebitis and later an ulcer of posterior leg Associated Signs and Symptoms: marked lymphedema of the right leg. He is already on Eloquis. HPI Description: 06/14/14 -- He returns for followup today. He denies any fevers. no fresh issues and his daughter says he's been doing fine. 06/21/14 -- after he sustained a fall this week earlier he applied a bandage over this himself and did not seek any medical attention. he did however manage to control the bleeding and had a dressing in place the next morning when his son to the visit. In this dressing was removed there was further damaged skin. His right leg has been doing fine otherwise. 07/12/14 --Very pleasant 81 year old with past medical history significant for congestive heart failure (EF 15%), peripheral vascular disease, and chronic kidney disease. He was hospitalized at [facility] in December 2015 for  congestive heart failure. He says that he fell during his hospital course and developed a hematoma over his right calf. He was also diagnosed with a right lower extremity DVT for which he takes Eliquis. The hematoma subsequently turned into an ulceration around Christmas, which has healed. He subsequently developed an ulcer on his right dorsal foot and a traumatic  left forearm ulcer. Per his report, he underwent biopsy of the right dorsal foot ulceration which demonstrated a skin cancer. His PCP and dermatologist office are both closed today. I reviewed his records in [place] but find no report of biopsy or pathology. He s without complaints today. No significant pain. No fever or chills. Minimal drainage. 07/19/2014 - the patient and his son tell me that about 2 weeks ago the dermatologist did a skin biopsy and this was a large area on the dorsum of his right foot which was left open and no dressing instructions were recommended. Since then he has been called and told that it is a cancer and the need to do a further procedure but that will not happen until about 2 weeks from now. In the meanwhile the patient has not been taking care of his right foot. The left forearm where he had an abrasion and laceration is doing very well. 07/26/2014 -- Reports from 07/08/2014 from the dermatology group reviewed. A excision was done of a lesion located on the dorsum of the right foot and this was 1.7 cm in diameter which was a shave biopsy performed. The wound was left open after appropriate cauterization and the patient was given this dressing instructions. 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 08/02/2014 -- all the original problems he came with have completely resolved. He now has a surgical wound on his right foot dorsum where a skin cancer was excised. He goes to see his dermatologist this [name], [name]  ([id number]) coming Tuesday and will have definite news next Friday. 08/09/2014 he had gone to his dermatologist on Tuesday and she has injected the base of his ulcer with some chemotherapeutic agent. He was supposed to bring some papers with him but forgot to get them and will bring them in next week. Other than that the dermatologist had suggested using Mehdi honey on the wound. 08/16/2014 -- the patient has brought in his notes from the dermatologist and on 08/06/2014 he received a injection of 5 FU, 500 mg grams into the lesion. the pathology report was also sent and it was a squamous cell carcinoma well-differentiated and edges were involved. They wanted him to use many honey for the wound dressing changes to be done 3 times a week. 08/30/2014 -- he has finished his second injection of 5-FU and has the next one in 2 weeks' time. He is doing fine otherwise. 09/13/2014 - No new complaints. No significant pain. No fever or chills. Minimal drainage. Still receiving 5-FU injections. 09/20/2014 -- He was seen by the dermatologist on 09/10/2014 and Dr. Phillip Heal injected his foot both the right on the dorsum and left near the medial malleolus with 5-FU. The next dose of 5-FU is to be given after 3 months. The patient says he now has a spot on the left medial malleolus where he was injected with 5-FU. 09/27/2014 -- the area on the left ankle where he was injected with 5-FU is now a full-blown ulcer. He also has mild pain in both ankle areas. 10/04/2014 - large had a bit of a fall and injured his right arm last evening and has had a laceration with no evidence of any foreign body in the right forearm. 12/20/2014 -- he recently saw his dermatologist at [facility] and she was pleased with his wound healing on the right lower extremity. She has not given him any further 5-FU injections and  will see him back on a when necessary basis. 01/03/2015 -- his skin substitute Grafix has been approved by his  insurance and we will go ahead with this next week. Electronic Signature(s) Signed: 01/03/2015 11:02:31 AM By: Christin Fudge MD, FACS Entered By: Christin Fudge on 01/03/2015 11:02:30 Philip [name] ([id number]) -------------------------------------------------------------------------------- Physical Exam Details Patient Name: Philip [name], Philip [name]. Date of Service: 01/03/2015 10:15 AM Medical Record Number: [id number] Patient Account Number: [id number] Date of Birth/Sex: February 11, 1919 (81 y.o. Male) Treating RN: Cornell Barman Primary Care Physician: Hortencia Pilar Other Clinician: Referring Physician: Hortencia Pilar Treating Physician/Extender: Frann Rider in Treatment: 35 Constitutional . Pulse regular. Respirations normal and unlabored. Afebrile. . Eyes Nonicteric. Reactive to light. Ears, Nose, Mouth, and Throat Lips, teeth, and gums WNL.Marland Kitchen Moist mucosa without lesions . Neck supple and nontender. No palpable supraclavicular or cervical adenopathy. Normal sized without goiter. Respiratory WNL. No retractions.. Cardiovascular Pedal Pulses WNL. No clubbing, cyanosis or edema. Lymphatic No adneopathy. No adenopathy. No adenopathy. Musculoskeletal Adexa without tenderness or enlargement.. Digits and nails w/o clubbing, cyanosis, infection, petechiae, ischemia, or inflammatory conditions.. Integumentary (Hair, Skin) No suspicious lesions. No crepitus or fluctuance. No peri-wound warmth or erythema. No masses.Marland Kitchen Psychiatric Judgement and insight Intact.. No evidence of depression, anxiety, or agitation.. Notes He has abraded his right forearm again and has a few superficial ulceration. The left medial ankle has some slough and debris and will need sharp debridement. Electronic Signature(s) Signed: 01/03/2015 11:03:14 AM By: Christin Fudge MD, FACS Entered By: Christin Fudge on 01/03/2015 11:03:14 Philip [name]  ([id number]) -------------------------------------------------------------------------------- Physician Orders Details Patient Name: Philip [name], Philip [name]. Date of Service: 01/03/2015 10:15 AM Medical Record Number: [id number] Patient Account Number: [id number] Date of Birth/Sex: 1919/02/09 (81 y.o. Male) Treating RN: Cornell Barman Primary Care Physician: Hortencia Pilar Other Clinician: Referring Physician: Hortencia Pilar Treating Physician/Extender: Frann Rider in Treatment: 16 Verbal / Phone Orders: Yes Clinician: Cornell Barman Read Back and Verified: Yes Diagnosis Coding Wound Cleansing Wound #4 Left,Medial Malleolus o Clean wound with wound cleanser. o Cleanse wound with mild soap and water o May Shower, gently pat wound dry prior to applying new dressing. o May shower with protection. Wound #5R Right,Lateral Forearm o Clean wound with wound cleanser. o Cleanse wound with mild soap and water o May Shower, gently pat wound dry prior to applying new dressing. o May shower with protection. Anesthetic Wound #4 Left,Medial Malleolus o Topical Lidocaine 4% cream applied to wound bed prior to debridement Wound #5R Right,Lateral Forearm o Topical Lidocaine 4% cream applied to wound bed prior to debridement Skin Barriers/Peri-Wound Care Wound #4 Left,Medial Malleolus o Skin Prep Wound #5R Right,Lateral Forearm o Skin Prep Primary Wound Dressing o Mepitel One Wound #4 Left,Medial Malleolus o Hydrafera Blue Secondary Dressing Wound #4 Left,Medial Malleolus o Boardered Foam Dressing Philip [name], Philip [name]. ([id number]) Wound #5R Right,Lateral Forearm o Boardered Foam Dressing o Dry Gauze Dressing Change Frequency Wound #4 Left,Medial Malleolus o Change dressing every other day. Wound #5R Right,Lateral Forearm o Other: - Leave intact will change at next wound care appt Follow-up Appointments Wound #4 Left,Medial Malleolus o Return Appointment in  1 week. Wound #5R Right,Lateral Forearm o Return Appointment in 1 week. Edema Control Wound #4 Left,Medial Malleolus o Tubigrip Home Health Wound #4 [place] Visits - [facility] Nurse may visit PRN to address patientos wound care needs. o FACE TO FACE ENCOUNTER: MEDICARE and MEDICAID PATIENTS: I certify that this patient is under my care  and that I had a face-to-face encounter that meets the physician face-to-face encounter requirements with this patient on this date. The encounter with the patient was in whole or in part for the following MEDICAL CONDITION: (primary reason for [place]) MEDICAL NECESSITY: I certify, that based on my findings, NURSING services are a medically necessary home health service. HOME BOUND STATUS: I certify that my clinical findings support that this patient is homebound (i.e., Due to illness or injury, pt requires aid of supportive devices such as crutches, cane, wheelchairs, walkers, the use of special transportation or the assistance of another person to leave their place of residence. There is a normal inability to leave the home and doing so requires considerable and taxing effort. Other absences are for medical reasons / religious services and are infrequent or of short duration when for other reasons). o If current dressing causes regression in wound condition, may D/C ordered dressing product/s and apply Normal Saline Moist Dressing daily until next [place] / Other MD appointment. Garner of regression in wound condition at [phone]. o Please direct any NON-WOUND related issues/requests for orders to patient's Primary Care Physician Notes Philip [name], Philip [name] ([id number]) Order Grafix for next visit Electronic Signature(s) Signed: 01/03/2015 4:45:33 PM By: Christin Fudge MD, FACS Signed: 01/03/2015 5:32:35 PM By: Gretta Cool RN, BSN, Kim RN, BSN Entered By:  Gretta Cool, RN, BSN, Kim on 01/03/2015 10:40:36 Philip [name] ([id number]) -------------------------------------------------------------------------------- Problem List Details Patient Name: Philip [name], Philip [name]. Date of Service: 01/03/2015 10:15 AM Medical Record Number: [id number] Patient Account Number: [id number] Date of Birth/Sex: 1918/09/12 (81 y.o. Male) Treating RN: Cornell Barman Primary Care Physician: Hortencia Pilar Other Clinician: Referring Physician: Hortencia Pilar Treating Physician/Extender: Frann Rider in Treatment: 14 Active Problems ICD-10 Encounter Code Description Active Date Diagnosis I70.232 Atherosclerosis of native arteries of right leg with 06/21/2014 Yes ulceration of calf I82.401 Acute embolism and thrombosis of unspecified deep veins 06/21/2014 Yes of right lower extremity S91.301A Unspecified open wound, right foot, initial encounter 07/19/2014 Yes Z92.21 Personal history of antineoplastic chemotherapy 08/23/2014 Yes L97.522 Non-pressure chronic ulcer of other part of left foot with fat 09/20/2014 Yes layer exposed S41.111A Laceration without foreign body of right upper arm, initial 10/04/2014 Yes encounter Inactive Problems Resolved Problems ICD-10 Code Description Active Date Resolved Date L97.212 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 Date of Service: 01/03/2015 10:15 AM Medical Record Number: [id number] Patient Account Number: [id number] Date of Birth/Sex: 05/01/1918 (81 y.o. Male) Treating RN: Cornell Barman Primary Care Physician: Hortencia Pilar Other Clinician: Referring Physician: Hortencia Pilar Treating Physician/Extender: Frann Rider in Treatment: 75 Subjective Chief Complaint Information obtained from Patient R foot ulcer. L forearm ulcer. 07/19/2014 -- about 2 weeks ago he had a surgical procedure done by dermatologist in [place] and has an open surgical wound on the dorsum of the right foot. History of Present Illness (HPI) The following HPI elements were documented for the patient's wound: Location: right leg Duration: Dec 2015 Modifying Factors: history of an injury to the right leg with resulting hematoma and thrombophlebitis and later an ulcer of posterior leg Associated Signs and Symptoms: marked lymphedema of the right leg. He is already on  Eloquis. 06/14/14 -- He returns for followup today. He denies any fevers. no fresh issues and his daughter says he's been doing fine. 06/21/14 -- after he sustained a fall this week earlier he applied a bandage over this himself and did not seek any medical attention. he did however manage to control the bleeding and had a dressing in place the next morning when his son to the visit. In this dressing was removed there was further damaged skin. His right leg has been doing fine otherwise. 07/12/14 --Very pleasant 81 year old with past medical history significant for congestive heart failure (EF 15%), peripheral vascular disease, and chronic kidney disease. He was hospitalized at [facility] in December 2015 for congestive heart failure. He says that he fell during his hospital course and developed a hematoma over his right calf. He was also diagnosed with a right lower extremity DVT for which he takes Eliquis. The hematoma subsequently turned into an ulceration around Christmas, which has healed. He subsequently developed an ulcer on his right dorsal foot and a traumatic left forearm ulcer. Per his report, he underwent biopsy of the right  dorsal foot ulceration which demonstrated a skin cancer. His PCP and dermatologist office are both closed today. I reviewed his records in [place] but find no report of biopsy or pathology. He s without complaints today. No significant pain. No fever or chills. Minimal drainage. 07/19/2014 - the patient and his son tell me that about 2 weeks ago the dermatologist did a skin biopsy and this was a large area on the dorsum of his right foot which was left open and no dressing instructions were recommended. Since then he has been called and told that it is a cancer and the need to do a further procedure but that will not happen until about 2 weeks from now. In the meanwhile the patient has not been taking care of his right foot. The left forearm where he had an abrasion and laceration is doing very well. Philip [name], Philip [name] ([id number]) 07/26/2014 -- Reports from 07/08/2014 from the dermatology group reviewed. A excision was done of a lesion located on the dorsum of the right foot and this was 1.7 cm in diameter which was a shave biopsy performed. The wound was left open after appropriate cauterization and the patient was given this dressing instructions. 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 08/02/2014 -- all the original problems he came with have completely resolved. He now has a surgical wound on his right foot dorsum where a skin cancer was excised. He goes to see his dermatologist this coming Tuesday and will have definite news next Friday. 08/09/2014 he had gone to his dermatologist on Tuesday and she has injected the base of his ulcer with some chemotherapeutic agent. He was supposed to bring some papers with him but forgot to get them and will bring them in next week. Other than that the dermatologist had suggested using Mehdi honey on the wound. 08/16/2014 -- the patient has brought in his notes from  the dermatologist and on 08/06/2014 he received a injection of 5 FU, 500 mg grams into the lesion. the pathology report was also sent and it was a squamous cell carcinoma well-differentiated and edges were involved. They wanted him to use many honey for the wound dressing changes to be done 3 times a week. 08/30/2014 -- he has finished his second injection of 5-FU and has the next one  in 2 weeks' time. He is doing fine otherwise. 09/13/2014 - No new complaints. No significant pain. No fever or chills. Minimal drainage. Still receiving 5-FU injections. 09/20/2014 -- He was seen by the dermatologist on 09/10/2014 and Dr. Phillip Heal injected his foot both the right on the dorsum and left near the medial malleolus with 5-FU. The next dose of 5-FU is to be given after 3 months. The patient says he now has a spot on the left medial malleolus where he was injected with 5-FU. 09/27/2014 -- the area on the left ankle where he was injected with 5-FU is now a full-blown ulcer. He also has mild pain in both ankle areas. 10/04/2014 - large had a bit of a fall and injured his right arm last evening and has had a laceration with no evidence of any foreign body in the right forearm. 12/20/2014 -- he recently saw his dermatologist at [facility] and she was pleased with his wound healing on the right lower extremity. She has not given him any further 5-FU injections and will see him back on a when necessary basis. 01/03/2015 -- his skin substitute Grafix has been approved by his insurance and we will go ahead with this next week. Objective Constitutional Pulse regular. Respirations normal and unlabored. Afebrile. Philip [name], Philip [name] ([id number]) Vitals Time Taken: 10:16 AM, Height: 69 in, Weight: 179 lbs, BMI: 26.4, Temperature: 98.1 F, Pulse: 74 bpm, Respiratory Rate: 18 breaths/min, Blood Pressure: 131/66 mmHg. Eyes Nonicteric. Reactive to light. Ears, Nose, Mouth, and Throat Lips, teeth, and gums WNL.Marland Kitchen Moist  mucosa without lesions . Neck supple and nontender. No palpable supraclavicular or cervical adenopathy. Normal sized without goiter. Respiratory WNL. No retractions.. Cardiovascular Pedal Pulses WNL. No clubbing, cyanosis or edema. Lymphatic No adneopathy. No adenopathy. No adenopathy. Musculoskeletal Adexa without tenderness or enlargement.. Digits and nails w/o clubbing, cyanosis, infection, petechiae, ischemia, or inflammatory conditions.Marland Kitchen Psychiatric Judgement and insight Intact.. No evidence of depression, anxiety, or agitation.. General Notes: He has abraded his right forearm again and has a few superficial ulceration. The left medial ankle has some slough and debris and will need sharp debridement. Integumentary (Hair, Skin) No suspicious lesions. No crepitus or fluctuance. No peri-wound warmth or erythema. No masses.. Wound #4 status is Open. Original cause of wound was Other Lesion. The wound is located on the Left,Medial Malleolus. The wound measures 2cm length x 2.2cm width x 0.4cm depth; 3.456cm^2 area and 1.382cm^3 volume. There is fascia exposed. There is a small amount of serosanguineous drainage noted. The wound margin is distinct with the outline attached to the wound base. There is small (1-33%) pink granulation within the wound bed. There is a medium (34-66%) amount of necrotic tissue within the wound bed including Adherent Slough. The periwound skin appearance exhibited: Localized Edema, Moist, Erythema. The periwound skin appearance did not exhibit: Callus, Crepitus, Excoriation, Fluctuance, Friable, Induration, Rash, Scarring, Dry/Scaly, Maceration, Atrophie Blanche, Cyanosis, Ecchymosis, Hemosiderin Staining, Mottled, Pallor, Rubor. The surrounding wound skin color is noted with erythema which is circumferential. Periwound temperature was noted as No Abnormality. The periwound has tenderness on palpation. Wound #5R status is Open. Original cause of wound was  Shear/Friction. The wound is located on the Right,Lateral Forearm. The wound measures 0.2cm length x 1.5cm width x 0.1cm depth; 0.236cm^2 area Philip [name], Philip [name]. ([id number]) and 0.024cm^3 volume. The wound is limited to skin breakdown. There is a medium amount of serosanguineous drainage noted. The wound margin is flat and intact. There is medium (34-66%) granulation within the wound  bed. There is a medium (34-66%) amount of necrotic tissue within the wound bed including Eschar. The periwound skin appearance exhibited: Moist. The periwound skin appearance did not exhibit: Callus, Crepitus, Excoriation, Fluctuance, Friable, Induration, Localized Edema, Rash, Scarring, Dry/Scaly, Maceration, Atrophie Blanche, Cyanosis, Ecchymosis, Hemosiderin Staining, Mottled, Pallor, Rubor, Erythema. 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 I82.401 - Acute embolism and thrombosis of unspecified deep veins of right lower extremity S91.301A - Unspecified open wound, right foot, initial encounter Z92.21 - Personal history of antineoplastic chemotherapy L97.522 - Non-pressure chronic ulcer of other part of left foot with fat layer exposed S41.111A - Laceration without foreign body of right upper arm, initial encounter I have recommended Mepitel on his arm and Levaquin dressing on for the week. On his left medial ankle we will use Hydrofera Blue on alternate days for this week and then use grafix next week. 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 There was a Non-Viable Tissue Open Wound/Selective [phone]) debridement with total area of 4.4 sq cm performed by Britto, Jackson Latino., MD. with the following instrument(s): Curette to remove Viable and Non-Viable tissue/material including Exudate, Fibrin/Slough, and Subcutaneous  after achieving pain control using Other (lidocaine 4%). A time out was conducted prior to the start of the procedure. A Moderate amount of bleeding was controlled with Pressure. The procedure was tolerated well with a pain level of 0 throughout and a pain level of 0 following the procedure. Post Debridement Measurements: 2cm length x 2.2cm width x 0.4cm depth; 1.382cm^3 volume. Post procedure Diagnosis Wound #4: Same as Pre-Procedure DAVIN, ARCHULETTA ([id number]) Plan Wound Cleansing: Wound #4 Left,Medial Malleolus: Clean wound with wound cleanser. Cleanse wound with mild soap and water May Shower, gently pat wound dry prior to applying new dressing. May shower with protection. Wound #5R Right,Lateral Forearm: Clean wound with wound cleanser. Cleanse wound with mild soap and water May Shower, gently pat wound dry prior to applying new dressing. May shower with protection. Anesthetic: Wound #4 Left,Medial Malleolus: Topical Lidocaine 4% cream applied to wound bed prior to debridement Wound #5R Right,Lateral Forearm: Topical Lidocaine 4% cream applied to wound bed prior to debridement Skin Barriers/Peri-Wound Care: Wound #4 Left,Medial Malleolus: Skin Prep Wound #5R Right,Lateral Forearm: Skin Prep Primary Wound Dressing: Mepitel One Wound #4 Left,Medial Malleolus: Hydrafera Blue Secondary Dressing: Wound #4 Left,Medial Malleolus: Boardered Foam Dressing Wound #5R Right,Lateral Forearm: Boardered Foam Dressing Dry Gauze Dressing Change Frequency: Wound #4 Left,Medial Malleolus: Change dressing every other day. Wound #5R Right,Lateral Forearm: Other: - Leave intact will change at next wound care appt Follow-up Appointments: Wound #4 Left,Medial Malleolus: Return Appointment in 1 week. Wound #5R Right,Lateral Forearm: Return Appointment in 1 week. Edema Control: Wound #4 Left,Medial Malleolus: [facility]: DEON, DUER ([id number]) Wound #4 Left,Medial  Malleolus: [place] [facility] - [place] Nurse may visit PRN to address patient s wound care needs. FACE TO FACE ENCOUNTER: MEDICARE and MEDICAID PATIENTS: I certify that this patient is under my care and that I had a face-to-face encounter that meets the physician face-to-face encounter requirements with this patient on this date. The encounter with the patient was in whole or in part for the following MEDICAL CONDITION: (primary reason for [place]) MEDICAL NECESSITY: I certify, that based on my findings, NURSING services are a medically necessary home health  service. HOME BOUND STATUS: I certify that my clinical findings support that this patient is homebound (i.e., Due to illness or injury, pt requires aid of supportive devices such as crutches, cane, wheelchairs, walkers, the use of special transportation or the assistance of another person to leave their place of residence. There is a normal inability to leave the home and doing so requires considerable and taxing effort. Other absences are for medical reasons / religious services and are infrequent or of short duration when for other reasons). If current dressing causes regression in wound condition, may D/C ordered dressing product/s and apply Normal Saline Moist Dressing daily until next [place] / Other MD appointment. Lyles of regression in wound condition at [phone]. Please direct any NON-WOUND related issues/requests for orders to patient's Primary Care Physician General Notes: Order Grafix for next visit I have recommended Mepitel on his arm and Levaquin dressing on for the week. On his left medial ankle we will use Hydrofera Blue on alternate days for this week and then use grafix next week. His daughter is at the bedside and understands the treatment plan Electronic Signature(s) Signed: 01/03/2015 11:04:17 AM By: Christin Fudge MD, FACS Entered By: Christin Fudge  on 01/03/2015 11:04:17 Philip [name] ([id number]) -------------------------------------------------------------------------------- SuperBill Details Patient Name: MEIKO, STRANAHAN. Date of Service: 01/03/2015 Medical Record Number: [id number] Patient Account Number: [id number] Date of Birth/Sex: 1918-12-26 (81 y.o. 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 I82.401 Acute embolism and thrombosis of unspecified deep veins of right lower extremity S91.301A Unspecified open wound, right foot, initial encounter Z92.21 Personal history of antineoplastic chemotherapy L97.522 Non-pressure chronic ulcer of other part of left foot with fat layer exposed S41.111A Laceration without foreign body of right upper arm, initial encounter Facility Procedures The patient participates with Medicare or their insurance follows the Medicare Facility Guidelines: CPT4 Code Description Modifier Quantity [id number] - DEBRIDE WOUND 1ST 20 SQ CM OR < 1 ICD-10 Description Diagnosis S41.111A Laceration without  foreign body of right upper arm, initial encounter L97.522 Non-pressure chronic ulcer of other part of left foot with fat layer exposed I70.232 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

## 2015-01-04 NOTE — Progress Notes (Signed)
DEAGO, BURRUSS ([id number]) Visit Report for 01/03/2015 Arrival Information Details Patient Name: Philip [name], Philip [name]. Date of Service: 01/03/2015 10:15 AM Medical Record Number: [id number] Patient Account Number: [id number] Date of Birth/Sex: [birth year] (81 y.o. Male) Treating RN: Cornell Barman Primary Care Physician: Hortencia Pilar Other Clinician: Referring Physician: Hortencia Pilar Treating Physician/Extender: Frann Rider in Treatment: 62 Visit Information History Since Last Visit Added or deleted any medications: No Patient Arrived: [name] Any new allergies or adverse reactions: No Arrival Time: 10:16 Had a fall or experienced change in No Accompanied By: daughter activities of daily living that may affect Transfer Assistance: None risk of falls: Patient Identification Verified: Yes Signs or symptoms of abuse/neglect since last No Secondary Verification Process Yes visito Completed: Hospitalized since last visit: No Patient Requires Transmission- No Has Dressing in Place as Prescribed: Yes Based Precautions: Pain Present Now: No Patient Has Alerts: Yes Patient Alerts: Patient on Blood Thinner No ABIs dt LE blood clots Electronic Signature(s) Signed: 01/03/2015 5:32:35 PM By: Gretta Cool, RN, BSN, Kim RN, BSN Entered By: Gretta Cool, RN, BSN, Kim on 01/03/2015 10:16:54 [name] ([id number]) -------------------------------------------------------------------------------- Encounter Discharge Information Details Patient Name: Philip [name], Philip [name]. Date of Service: 01/03/2015 10:15 AM Medical Record Number: [id number] Patient Account Number: [id number] Date of Birth/Sex: 1918/08/28 (81 y.o. Male) Treating RN: Cornell Barman Primary Care Physician: Hortencia Pilar Other Clinician: Referring Physician: Hortencia Pilar Treating Physician/Extender: Frann Rider in Treatment: 41 Encounter Discharge Information Items Discharge Pain Level: 0 Discharge Condition: Stable Ambulatory  Status: Cane Discharge Destination: Home Transportation: Private Auto Accompanied By: self Schedule Follow-up Appointment: Yes Medication Reconciliation completed No and provided to Patient/Care Provider: Provided on Clinical Summary of Care: 01/03/2015 Form Type Recipient Paper Patient GT Electronic Signature(s) Signed: 01/03/2015 5:32:35 PM By: Gretta Cool RN, BSN, Kim RN, BSN Previous Signature: 01/03/2015 10:51:31 AM Version By: Ruthine Dose Entered By: Gretta Cool RN, BSN, Kim on 01/03/2015 10:52:51 [name] ([id number]) -------------------------------------------------------------------------------- Lower Extremity Assessment Details Patient Name: Philip [name], Philip [name]. Date of Service: 01/03/2015 10:15 AM Medical Record Number: [id number] Patient Account Number: [id number] Date of Birth/Sex: April 28, 1918 (81 y.o. Male) Treating RN: Cornell Barman Primary Care Physician: Hortencia Pilar Other Clinician: Referring Physician: Hortencia Pilar Treating Physician/Extender: Frann Rider in Treatment: 43 Vascular Assessment Pulses: Posterior Tibial Dorsalis Pedis Palpable: [Left:Yes] Extremity colors, hair growth, and conditions: Extremity Color: [Left:Hyperpigmented] Hair Growth on Extremity: [Left:No] Temperature of Extremity: [Left:Warm] Capillary Refill: [Left:< 3 seconds] Toe Nail Assessment Left: Right: Thick: Yes Discolored: Yes Deformed: No Improper Length and Hygiene: No Electronic Signature(s) Signed: 01/03/2015 5:32:35 PM By: Gretta Cool, RN, BSN, Kim RN, BSN Entered By: Gretta Cool, RN, BSN, Kim on 01/03/2015 10:22:51 [name] ([id number]) -------------------------------------------------------------------------------- Multi Wound Chart Details Patient Name: Philip [name], Philip [name]. Date of Service: 01/03/2015 10:15 AM Medical Record Number: [id number] Patient Account Number: [id number] Date of Birth/Sex: 04/05/1919 (81 y.o. Male) Treating RN: Cornell Barman Primary Care Physician:  Hortencia Pilar Other Clinician: Referring Physician: Hortencia Pilar Treating Physician/Extender: Frann Rider in Treatment: 35 Vital Signs Height(in): 69 Pulse(bpm): 74 Weight(lbs): 179 Blood Pressure 131/66 (mmHg): Body Mass Index(BMI): 26 Temperature(F): 98.1 Respiratory Rate 18 (breaths/min): Photos: [4:No Photos] [5R:No Photos] [N/A:N/A] Wound Location: [4:Left Malleolus - Medial] [5R:Right Forearm - Lateral] [N/A:N/A] Wounding Event: [4:Other Lesion] [5R:Shear/Friction] [N/A:N/A] Primary Etiology: [4:Malignant Wound] [5R:Skin Tear] [N/A:N/A] Comorbid History: [4:Cataracts, Arrhythmia, Congestive Heart Failure] [5R:Cataracts, Arrhythmia, Congestive Heart Failure] [N/A:N/A] Date Acquired: [4:09/20/2014] [5R:09/22/2014] [N/A:N/A] Weeks of Treatment: [4:15] [5R:13] [N/A:N/A] Wound Status: [4:Open] [5R:Open] [N/A:N/A] Wound Recurrence: [4:No] [5R:Yes] [N/A:N/A] Measurements  L x W x D 2x2.2x0.4 [5R:0.2x1.5x0.1] [N/A:N/A] (cm) Area (cm) : [4:3.456] [5R:0.236] [N/A:N/A] Volume (cm) : [4:1.382] [5R:0.024] [N/A:N/A] % Reduction in Area: [4:-2101.30%] [5R:96.20%] [N/A:N/A] % Reduction in Volume: -8537.50% [5R:96.20%] [N/A:N/A] Classification: [4:Full Thickness With Exposed Support Structures] [5R:Partial Thickness] [N/A:N/A] Exudate Amount: [4:Small] [5R:Medium] [N/A:N/A] Exudate Type: [4:Serosanguineous] [5R:Serosanguineous] [N/A:N/A] Exudate Color: [4:red, brown] [5R:red, brown] [N/A:N/A] Wound Margin: [4:Distinct, outline attached] [5R:Flat and Intact] [N/A:N/A] Granulation Amount: [4:Small (1-33%)] [5R:Medium (34-66%)] [N/A:N/A] Granulation Quality: [4:Pink] [5R:N/A] [N/A:N/A] Necrotic Amount: [4:Medium (34-66%)] [5R:Medium (34-66%)] [N/A:N/A] Necrotic Tissue: [4:Adherent Slough] [5R:Eschar] [N/A:N/A] Exposed Structures: [4:Fascia: Yes Fat: No Tendon: No] [5R:Fascia: No Fat: No Tendon: No] [N/A:N/A] Muscle: No Muscle: No Joint: No Joint: No Bone: No Bone:  No Limited to Skin Breakdown Epithelialization: None N/A N/A Periwound Skin Texture: Edema: Yes Edema: No N/A Excoriation: No Excoriation: No Induration: No Induration: No Callus: No Callus: No Crepitus: No Crepitus: No Fluctuance: No Fluctuance: No Friable: No Friable: No Rash: No Rash: No Scarring: No Scarring: No Periwound Skin Moist: Yes Moist: Yes N/A Moisture: Maceration: No Maceration: No Dry/Scaly: No Dry/Scaly: No Periwound Skin Color: Erythema: Yes Atrophie Blanche: No N/A Atrophie Blanche: No Cyanosis: No Cyanosis: No Ecchymosis: No Ecchymosis: No Erythema: No Hemosiderin Staining: No Hemosiderin Staining: No Mottled: No Mottled: No Pallor: No Pallor: No Rubor: No Rubor: No Erythema Location: Circumferential N/A N/A Temperature: No Abnormality N/A N/A Tenderness on Yes No N/A Palpation: Wound Preparation: Ulcer Cleansing: Ulcer Cleansing: N/A Rinsed/Irrigated with Rinsed/Irrigated with Saline Saline Topical Anesthetic Topical Anesthetic Applied: Other: lidocaine Applied: Other: lidocaine 4% 4% Treatment Notes Electronic Signature(s) Signed: 01/03/2015 5:32:35 PM By: Gretta Cool, RN, BSN, Kim RN, BSN Entered By: Gretta Cool, RN, BSN, Kim on 01/03/2015 10:34:40 [name] ([id number]) -------------------------------------------------------------------------------- [place] Plan Details Patient Name: Philip [name], Philip [name]. Date of Service: 01/03/2015 10:15 AM Medical Record Number: [id number] Patient Account Number: [id number] Date of Birth/Sex: 1918/11/06 (81 y.o. Male) Treating RN: Cornell Barman Primary Care Physician: Hortencia Pilar Other Clinician: Referring Physician: Hortencia Pilar Treating Physician/Extender: Frann Rider in Treatment: 29 Active Inactive Abuse / Safety / Falls / Self Care Management Nursing Diagnoses: Abuse or neglect; actual or potential Potential for falls Goals: Patient will remain injury free Date  Initiated: 05/02/2014 Goal Status: Active Interventions: Assess fall risk on admission and as needed Notes: Necrotic Tissue Nursing Diagnoses: Impaired tissue integrity related to necrotic/devitalized tissue Goals: Necrotic/devitalized tissue will be minimized in the wound bed Date Initiated: 05/02/2014 Goal Status: Active Interventions: Assess patient pain level pre-, during and post procedure and prior to discharge Treatment Activities: Apply topical anesthetic as ordered : 01/03/2015 Notes: Orientation to the Wound Care Program Nursing Diagnoses: Knowledge deficit related to the wound healing center program VIKASH, NEST ([id number]) Goals: Patient/caregiver will verbalize understanding of the [facility] Program Date Initiated: 05/02/2014 Goal Status: Active Interventions: Provide education on orientation to the wound center Notes: Electronic Signature(s) Signed: 01/03/2015 5:32:35 PM By: Gretta Cool, RN, BSN, Kim RN, BSN Entered By: Gretta Cool, RN, BSN, Kim on 01/03/2015 10:33:48 [name] ([id number]) -------------------------------------------------------------------------------- Patient/Caregiver Education Details Patient Name: Philip [name], Philip [name]. Date of Service: 01/03/2015 10:15 AM Medical Record Number: [id number] Patient Account Number: [id number] Date of Birth/Gender: 11/29/1918 (81 y.o. Male) Treating RN: Cornell Barman Primary Care Physician: Hortencia Pilar Other Clinician: Referring Physician: Hortencia Pilar Treating Physician/Extender: Frann Rider in Treatment: 20 Education Assessment Education Provided To: Patient Education Topics Provided Wound/Skin Impairment: Handouts: Caring for Your Ulcer, Other: wound care as prescribed Electronic Signature(s) Signed: 01/03/2015 5:32:35  PM By: Gretta Cool, RN, BSN, Kim RN, BSN Entered By: Gretta Cool, RN, BSN, Kim on 01/03/2015 10:53:09 [name]  ([id number]) -------------------------------------------------------------------------------- Wound Assessment Details Patient Name: Philip [name], Philip [name]. Date of Service: 01/03/2015 10:15 AM Medical Record Number: [id number] Patient Account Number: [id number] Date of Birth/Sex: 04/05/1919 (81 y.o. Male) Treating RN: Cornell Barman Primary Care Physician: Hortencia Pilar Other Clinician: Referring Physician: Hortencia Pilar Treating Physician/Extender: Frann Rider in Treatment: 35 Wound Status Wound Number: 4 Primary Malignant Wound Etiology: Wound Location: Left Malleolus - Medial Wound Status: Open Wounding Event: Other Lesion Comorbid Cataracts, Arrhythmia, Congestive Date Acquired: 09/20/2014 History: Heart Failure Weeks Of Treatment: 15 Clustered Wound: No Photos Photo Uploaded By: Gretta Cool, RN, BSN, Kim on 01/03/2015 15:13:16 Wound Measurements Length: (cm) 2 Width: (cm) 2.2 Depth: (cm) 0.4 Area: (cm) 3.456 Volume: (cm) 1.382 % Reduction in Area: -2101.3% % Reduction in Volume: -8537.5% Epithelialization: None Wound Description Full Thickness With Exposed Classification: Support Structures Wound Margin: Distinct, outline attached Exudate Small Amount: Exudate Type: Serosanguineous Exudate Color: red, brown Foul Odor After Cleansing: No Wound Bed Granulation Amount: Small (1-33%) Exposed Structure Granulation Quality: Pink Fascia Exposed: Yes Necrotic Amount: Medium (34-66%) Fat Layer Exposed: No Philip [name], Philip [name] ([id number]) Necrotic Quality: Adherent Slough Tendon Exposed: No Muscle Exposed: No Joint Exposed: No Bone Exposed: No Periwound Skin Texture Texture Color No Abnormalities Noted: No No Abnormalities Noted: No Callus: No Atrophie Blanche: No Crepitus: No Cyanosis: No Excoriation: No Ecchymosis: No Fluctuance: No Erythema: Yes Friable: No Erythema Location: Circumferential Induration: No Hemosiderin Staining: No Localized Edema:  Yes Mottled: No Rash: No Pallor: No Scarring: No Rubor: No Moisture Temperature / Pain No Abnormalities Noted: No Temperature: No Abnormality Dry / Scaly: No Tenderness on Palpation: Yes Maceration: No Moist: Yes Wound Preparation Ulcer Cleansing: Rinsed/Irrigated with Saline Topical Anesthetic Applied: Other: lidocaine 4%, Treatment Notes Wound #4 (Left, Medial Malleolus) 1. Cleansed with: Clean wound with Normal Saline 2. Anesthetic Topical Lidocaine 4% cream to wound bed prior to debridement 4. Dressing Applied: Hydrafera Blue 5. Secondary Dressing Applied Bordered Foam Dressing Notes Mepitel, gauze bolster, BFD on arm Electronic Signature(s) Signed: 01/03/2015 5:32:35 PM By: Gretta Cool, RN, BSN, Kim RN, BSN Entered By: Gretta Cool, RN, BSN, Kim on 01/03/2015 73:53:29 [name] ([id number]) -------------------------------------------------------------------------------- Wound Assessment Details Patient Name: Philip [name], Philip [name]. Date of Service: 01/03/2015 10:15 AM Medical Record Number: [id number] Patient Account Number: [id number] Date of Birth/Sex: Feb 27, 1919 (81 y.o. Male) Treating RN: Cornell Barman Primary Care Physician: Hortencia Pilar Other Clinician: Referring Physician: Hortencia Pilar Treating Physician/Extender: Frann Rider in Treatment: 35 Wound Status Wound Number: 5R Primary Skin Tear Etiology: Wound Location: Right Forearm - Lateral Wound Status: Open Wounding Event: Shear/Friction Comorbid Cataracts, Arrhythmia, Congestive Date Acquired: 09/22/2014 History: Heart Failure Weeks Of Treatment: 13 Clustered Wound: No Photos Photo Uploaded By: Gretta Cool, RN, BSN, Kim on 01/03/2015 15:13:29 Wound Measurements Length: (cm) 0.2 Width: (cm) 1.5 Depth: (cm) 0.1 Area: (cm) 0.236 Volume: (cm) 0.024 % Reduction in Area: 96.2% % Reduction in Volume: 96.2% Wound Description Classification: Partial Thickness Wound Margin: Flat and Intact Exudate Amount:  Medium Exudate Type: Serosanguineous Exudate Color: red, brown Wound Bed Granulation Amount: Medium (34-66%) Exposed Structure Necrotic Amount: Medium (34-66%) Fascia Exposed: No Necrotic Quality: Eschar Fat Layer Exposed: No Tendon Exposed: No Philip [name], Philip [name]. ([id number]) Muscle Exposed: No Joint Exposed: No Bone Exposed: No Limited to Skin Breakdown Periwound Skin Texture Texture Color No Abnormalities Noted: No No Abnormalities Noted: No Callus: No Atrophie Blanche: No Crepitus: No Cyanosis:  No Excoriation: No Ecchymosis: No Fluctuance: No Erythema: No Friable: No Hemosiderin Staining: No Induration: No Mottled: No Localized Edema: No Pallor: No Rash: No Rubor: No Scarring: No Moisture No Abnormalities Noted: No Dry / Scaly: No Maceration: No Moist: Yes Wound Preparation Ulcer Cleansing: Rinsed/Irrigated with Saline Topical Anesthetic Applied: Other: lidocaine 4%, Treatment Notes Wound #5R (Right, Lateral Forearm) 1. Cleansed with: Clean wound with Normal Saline 2. Anesthetic Topical Lidocaine 4% cream to wound bed prior to debridement 4. Dressing Applied: Hydrafera Blue 5. Secondary Dressing Applied Bordered Foam Dressing Notes Mepitel, gauze bolster, BFD on arm Electronic Signature(s) Signed: 01/03/2015 5:32:35 PM By: Gretta Cool, RN, BSN, Kim RN, BSN Entered By: Gretta Cool, RN, BSN, Kim on 01/03/2015 10:28:04 [name] ([id number]) -------------------------------------------------------------------------------- [place] Details Patient Name: Philip [name], KOSKELA. Date of Service: 01/03/2015 10:15 AM Medical Record Number: [id number] Patient Account Number: [id number] Date of Birth/Sex: [birth year] (81 y.o. Male) Treating RN: Cornell Barman Primary Care Physician: Hortencia Pilar Other Clinician: Referring Physician: Hortencia Pilar Treating Physician/Extender: Frann Rider in Treatment: 35 Vital Signs Time Taken: 10:16 Temperature (F): 98.1 Height  (in): 69 Pulse (bpm): 74 Weight (lbs): 179 Respiratory Rate (breaths/min): 18 Body Mass Index (BMI): 26.4 Blood Pressure (mmHg): 131/66 Reference Range: 80 - 120 mg / dl Electronic Signature(s) Signed: 01/03/2015 5:32:35 PM By: Gretta Cool, RN, BSN, Kim RN, BSN Entered By: Gretta Cool, RN, BSN, Kim on 01/03/2015 10:18:00

## 2015-01-10 ENCOUNTER — Encounter: Payer: Medicare Other | Admitting: Surgery

## 2015-01-10 DIAGNOSIS — L97522 Non-pressure chronic ulcer of other part of left foot with fat layer exposed: Secondary | ICD-10-CM | POA: Diagnosis not present

## 2015-01-10 NOTE — Progress Notes (Addendum)
[name], [name] ([id number]) Visit Report for 01/10/2015 Arrival Information Details Patient Name: Philip [name], Philip [name]. Date of Service: 01/10/2015 10:15 AM Medical Record Number: [id number] Patient Account Number: [id number] Date of Birth/Sex: [birth year] (81 y.o. Male) Treating RN: Baruch Gouty, RN, BSN, Velva Harman Primary Care Physician: Hortencia Pilar Other Clinician: Referring Physician: Hortencia Pilar Treating Physician/Extender: Frann Rider in Treatment: 78 Visit Information History Since Last Visit Any new allergies or adverse reactions: No Patient Arrived: [name] Had a fall or experienced change in No Arrival Time: 10:14 activities of daily living that may affect Accompanied By: son risk of falls: Transfer Assistance: None Signs or symptoms of abuse/neglect since last No Patient Identification Verified: Yes visito Secondary Verification Process Yes Hospitalized since last visit: No Completed: Has Dressing in Place as Prescribed: Yes Patient Requires Transmission- No Pain Present Now: No Based Precautions: Patient Has Alerts: Yes Patient Alerts: Patient on Blood Thinner No ABIs dt LE blood clots Electronic Signature(s) Signed: 01/10/2015 10:14:47 AM By: Regan Lemming BSN, RN Entered By: Regan Lemming on 01/10/2015 10:14:47 Philip [name] ([id number]) -------------------------------------------------------------------------------- Encounter Discharge Information Details Patient Name: Philip [name], Philip [name]. Date of Service: 01/10/2015 10:15 AM Medical Record Number: [id number] Patient Account Number: [id number] Date of Birth/Sex: Jul 27, 1918 (81 y.o. Male) Treating RN: Baruch Gouty, RN, BSN, Velva Harman Primary Care Physician: Hortencia Pilar Other Clinician: Referring Physician: Hortencia Pilar Treating Physician/Extender: Frann Rider in Treatment: 27 Encounter Discharge Information Items Discharge Pain Level: 0 Discharge Condition: Stable Ambulatory Status: Cane Discharge Destination:  Home Transportation: Private Auto Accompanied By: son Schedule Follow-up Appointment: No Medication Reconciliation completed No and provided to Patient/Care Provider: Provided on Clinical Summary of Care: 01/10/2015 Form Type Recipient Paper Patient GT Electronic Signature(s) Signed: 01/10/2015 11:03:13 AM By: Regan Lemming BSN, RN Previous Signature: 01/10/2015 10:58:00 AM Version By: Ruthine Dose Entered By: Regan Lemming on 01/10/2015 11:03:13 Philip [name] ([id number]) -------------------------------------------------------------------------------- Lower Extremity Assessment Details Patient Name: Philip [name], Philip [name]. Date of Service: 01/10/2015 10:15 AM Medical Record Number: [id number] Patient Account Number: [id number] Date of Birth/Sex: 23-Nov-1918 (81 y.o. Male) Treating RN: Baruch Gouty, RN, BSN, Velva Harman Primary Care Physician: Hortencia Pilar Other Clinician: Referring Physician: Hortencia Pilar Treating Physician/Extender: Frann Rider in Treatment: 36 Vascular Assessment Pulses: Posterior Tibial Dorsalis Pedis Palpable: [Left:Yes] Extremity colors, hair growth, and conditions: Extremity Color: [Left:Hyperpigmented] Hair Growth on Extremity: [Left:No] Temperature of Extremity: [Left:Warm] Capillary Refill: [Left:< 3 seconds] Toe Nail Assessment Left: Right: Thick: Yes Discolored: Yes Deformed: No Improper Length and Hygiene: No Electronic Signature(s) Signed: 01/10/2015 10:15:50 AM By: Regan Lemming BSN, RN Entered By: Regan Lemming on 01/10/2015 10:15:49 Philip [name] ([id number]) -------------------------------------------------------------------------------- Multi Wound Chart Details Patient Name: Philip [name]. Date of Service: 01/10/2015 10:15 AM Medical Record Number: [id number] Patient Account Number: [id number] Date of Birth/Sex: [birth year] (80 y.o. 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] [5R:Skin Tear] [N/A:N/A] Comorbid History: [4:Cataracts, Arrhythmia, Congestive Heart Failure] [5R:Cataracts, Arrhythmia, Congestive Heart Failure] [N/A:N/A] Date Acquired: [4:09/20/2014] [5R:09/22/2014] [N/A:N/A] Weeks of Treatment: [4:16] [5R:14] [N/A:N/A] Wound Status: [4:Open] [5R:Open] [N/A:N/A] Wound Recurrence: [4:No] [5R:Yes] [N/A:N/A] Measurements L x W x D 2.4x2.5x0.5 [5R:14.5x7x0.1] [N/A:N/A] (cm) Area (cm) : [  4:4.712] [5R:79.718] [N/A:N/A] Volume (cm) : [4:2.356] [5R:7.972] [N/A:N/A] % Reduction in Area: [4:-2901.30%] [5R:-1168.80%] [N/A:N/A] % Reduction in Volume: -14625.00% [5R:-1169.40%] [N/A:N/A] Classification: [4:Full Thickness With Exposed Support Structures] [5R:Partial Thickness] [N/A:N/A] Exudate Amount: [4:Small] [5R:Medium] [N/A:N/A] Exudate Type: [4:Serosanguineous] [5R:Serosanguineous] [N/A:N/A] Exudate Color: [4:red, brown] [5R:red, brown] [N/A:N/A] Wound Margin: [4:Distinct, outline attached] [5R:Flat and Intact] [N/A:N/A] Granulation Amount: [4:Small (1-33%)] [5R:Large (67-100%)] [N/A:N/A] Granulation Quality: [4:Pink] [5R:N/A] [N/A:N/A] Necrotic Amount: [4:Medium (34-66%)] [5R:None Present (0%)] [N/A:N/A] Exposed Structures: [4:Fascia: Yes Tendon: Yes Fat: No Muscle: No] [5R:Fascia: No Fat: No Tendon: No Muscle: No] [N/A:N/A] Joint: No Joint: No Bone: No Bone: No Limited to Skin Breakdown Epithelialization: None Small (1-33%) N/A Periwound Skin Texture: Edema: Yes  Edema: No N/A Excoriation: No Excoriation: No Induration: No Induration: No Callus: No Callus: No Crepitus: No Crepitus: No Fluctuance: No Fluctuance: No Friable: No Friable: No Rash: No Rash: No Scarring: No Scarring: No Periwound Skin Moist: Yes Maceration: Yes N/A Moisture: Maceration: No Moist: Yes Dry/Scaly: No Dry/Scaly: No Periwound Skin Color: Erythema: Yes Atrophie Blanche: No N/A Atrophie Blanche: No Cyanosis: No Cyanosis: No Ecchymosis: No Ecchymosis: No Erythema: No Hemosiderin Staining: No Hemosiderin Staining: No Mottled: No Mottled: No Pallor: No Pallor: No Rubor: No Rubor: No Erythema Location: Circumferential N/A N/A Temperature: No Abnormality N/A N/A Tenderness on Yes No N/A Palpation: Wound Preparation: Ulcer Cleansing: Ulcer Cleansing: N/A Rinsed/Irrigated with Rinsed/Irrigated with Saline Saline Topical Anesthetic Topical Anesthetic Applied: Other: lidocaine Applied: Other: lidocaine 4% 4% Treatment Notes Electronic Signature(s) Signed: 01/10/2015 5:14:03 PM By: Gretta Cool, RN, BSN, Kim RN, BSN Entered By: Gretta Cool, RN, BSN, Kim on 01/10/2015 10:34:26 Philip [name] ([id number]) -------------------------------------------------------------------------------- [place] Plan Details Patient Name: Philip [name], Philip [name]. Date of Service: 01/10/2015 10:15 AM Medical Record Number: [id number] Patient Account Number: [id number] Date of Birth/Sex: March 04, 1919 (81 y.o. Male) Treating RN: Cornell Barman Primary Care Physician: Hortencia Pilar Other Clinician: Referring Physician: Hortencia Pilar Treating Physician/Extender: Frann Rider in Treatment: 67 Active Inactive Abuse / Safety / Falls / Self Care Management Nursing Diagnoses: Abuse or neglect; actual or potential Potential for falls Goals: Patient will remain injury free Date Initiated: 05/02/2014 Goal Status: Active Interventions: Assess fall risk on admission and as  needed Notes: Necrotic Tissue Nursing Diagnoses: Impaired tissue integrity related to necrotic/devitalized tissue Goals: Necrotic/devitalized tissue will be minimized in the wound bed Date Initiated: 05/02/2014 Goal Status: Active Interventions: Assess patient pain level pre-, during and post procedure and prior to discharge Treatment Activities: Apply topical anesthetic as ordered : 01/10/2015 Notes: Orientation to the Wound Care Program Nursing Diagnoses: Knowledge deficit related to the wound healing center program Philip [name], Philip [name] ([id number]) Goals: Patient/caregiver will verbalize understanding of the [facility] Program Date Initiated: 05/02/2014 Goal Status: Active Interventions: Provide education on orientation to the wound center Notes: Electronic Signature(s) Signed: 01/10/2015 5:14:03 PM By: Gretta Cool, RN, BSN, Kim RN, BSN Entered By: Gretta Cool, RN, BSN, Kim on 01/10/2015 10:34:17 Philip [name] ([id number]) -------------------------------------------------------------------------------- Pain Assessment Details Patient Name: Philip [name], Philip [name]. Date of Service: 01/10/2015 10:15 AM Medical Record Number: [id number] Patient Account Number: [id number] Date of Birth/Sex: 11/19/1918 (81 y.o. Male) Treating RN: Baruch Gouty, RN, BSN, Velva Harman Primary Care Physician: Hortencia Pilar Other Clinician: Referring Physician: Hortencia Pilar Treating Physician/Extender: Frann Rider in Treatment: 84 Active Problems Location of Pain Severity and Description of Pain Patient Has Paino No Site Locations Pain Management and Medication Current Pain Management: Electronic Signature(s) Signed: 01/10/2015 10:14:53 AM By: Regan Lemming BSN, RN Entered By: Regan Lemming on 01/10/2015  10:14:53 Philip [name], Philip [name] ([id number]) -------------------------------------------------------------------------------- Patient/Caregiver Education Details Patient Name: Philip [name], Philip [name]. Date of Service: 01/10/2015  10:15 AM Medical Record Number: [id number] Patient Account Number: [id number] Date of Birth/Gender: 1918/11/23 (81 y.o. Male) Treating RN: Baruch Gouty, RN, BSN, Velva Harman Primary Care Physician: Hortencia Pilar Other Clinician: Referring Physician: Hortencia Pilar Treating Physician/Extender: Frann Rider in Treatment: 8 Education Assessment Education Provided To: Patient Education Topics Provided Basic Hygiene: Methods: Explain/Verbal Responses: State content correctly Welcome To [facility]: Methods: Explain/Verbal Responses: State content correctly Electronic Signature(s) Signed: 01/10/2015 11:03:26 AM By: Regan Lemming BSN, RN Entered By: Regan Lemming on 01/10/2015 11:03:26 Philip [name] ([id number]) -------------------------------------------------------------------------------- Wound Assessment Details Patient Name: Philip [name], Philip [name]. Date of Service: 01/10/2015 10:15 AM Medical Record Number: [id number] Patient Account Number: [id number] Date of Birth/Sex: 04/29/1918 (81 y.o. Male) Treating RN: Baruch Gouty, RN, BSN, Velva Harman Primary Care Physician: Hortencia Pilar Other Clinician: Referring Physician: Hortencia Pilar Treating Physician/Extender: Frann Rider in Treatment: 36 Wound Status Wound Number: 4 Primary Malignant Wound Etiology: Wound Location: Left Malleolus - Medial Wound Status: Open Wounding Event: Other Lesion Comorbid Cataracts, Arrhythmia, Congestive Date Acquired: 09/20/2014 History: Heart Failure Weeks Of Treatment: 16 Clustered Wound: No Photos Photo Uploaded By: Regan Lemming on 01/10/2015 16:19:23 Wound Measurements Length: (cm) 2.4 Width: (cm) 2.5 Depth: (cm) 0.5 Area: (cm) 4.712 Volume: (cm) 2.356 % Reduction in Area: -2901.3% % Reduction in Volume: -14625% Epithelialization: None Tunneling: No Undermining: No Wound Description Full Thickness With Exposed Classification: Support Structures Wound Margin: Distinct, outline  attached Exudate Small Amount: Exudate Type: Serosanguineous Exudate Color: red, brown Foul Odor After Cleansing: No Wound Bed Granulation Amount: Small (1-33%) Exposed Structure Granulation Quality: Pink Fascia Exposed: Yes Necrotic Amount: Medium (34-66%) Fat Layer Exposed: No Philip [name], Philip [name] ([id number]) Necrotic Quality: Adherent Slough Tendon Exposed: Yes Muscle Exposed: No Joint Exposed: No Bone Exposed: No Periwound Skin Texture Texture Color No Abnormalities Noted: No No Abnormalities Noted: No Callus: No Atrophie Blanche: No Crepitus: No Cyanosis: No Excoriation: No Ecchymosis: No Fluctuance: No Erythema: Yes Friable: No Erythema Location: Circumferential Induration: No Hemosiderin Staining: No Localized Edema: Yes Mottled: No Rash: No Pallor: No Scarring: No Rubor: No Moisture Temperature / Pain No Abnormalities Noted: No Temperature: No Abnormality Dry / Scaly: No Tenderness on Palpation: Yes Maceration: No Moist: Yes Wound Preparation Ulcer Cleansing: Rinsed/Irrigated with Saline Topical Anesthetic Applied: Other: lidocaine 4%, Treatment Notes Wound #4 (Left, Medial Malleolus) 4. Dressing Applied: Other dressing (specify in notes) 5. Secondary Dressing Applied Dry Gauze Gauze and Kerlix/Conform Notes Grafix applied by MD. Lillard [name] with gauze, kerlix and coban Electronic Signature(s) Signed: 01/10/2015 10:26:06 AM By: Regan Lemming BSN, RN Entered By: Regan Lemming on 01/10/2015 10:26:05 Philip [name] ([id number]) -------------------------------------------------------------------------------- Wound Assessment Details Patient Name: Philip [name], Philip [name]. Date of Service: 01/10/2015 10:15 AM Medical Record Number: [id number] Patient Account Number: [id number] Date of Birth/Sex: March 11, 1919 (81 y.o. Male) Treating RN: Baruch Gouty, RN, BSN, [place] Primary Care Physician: Hortencia Pilar Other Clinician: Referring Physician: Hortencia Pilar Treating  Physician/Extender: Frann Rider in Treatment: 36 Wound Status Wound Number: 5R Primary Skin Tear Etiology: Wound Location: Right Forearm - Lateral Wound Status: Open Wounding Event: Shear/Friction Comorbid Cataracts, Arrhythmia, Congestive Date Acquired: 09/22/2014 History: Heart Failure Weeks Of Treatment: 14 Clustered Wound: No Photos Photo Uploaded By: Regan Lemming on 01/10/2015 16:19:23 Wound Measurements Length: (cm) 14.5 Width: (cm) 7 Depth: (cm) 0.1 Area: (cm) 79.718 Volume: (cm) 7.972 % Reduction in Area: -1168.8% % Reduction in Volume: -1169.4% Epithelialization: Small (1-33%) Tunneling:  No Wound Description Classification: Partial Thickness Foul Odor Aft Wound Margin: Flat and Intact Exudate Amount: Medium Exudate Type: Serosanguineous Exudate Color: red, brown er Cleansing: No Wound Bed Granulation Amount: Large (67-100%) Exposed Structure Necrotic Amount: None Present (0%) Fascia Exposed: No Fat Layer Exposed: No Tendon Exposed: No BALLARD, BUDNEY ([id number]) Muscle Exposed: No Joint Exposed: No Bone Exposed: No Limited to Skin Breakdown Periwound Skin Texture Texture Color No Abnormalities Noted: No No Abnormalities Noted: No Callus: No Atrophie Blanche: No Crepitus: No Cyanosis: No Excoriation: No Ecchymosis: No Fluctuance: No Erythema: No Friable: No Hemosiderin Staining: No Induration: No Mottled: No Localized Edema: No Pallor: No Rash: No Rubor: No Scarring: No Moisture No Abnormalities Noted: No Dry / Scaly: No Maceration: Yes Moist: Yes Wound Preparation Ulcer Cleansing: Rinsed/Irrigated with Saline Topical Anesthetic Applied: Other: lidocaine 4%, Treatment Notes Wound #5R (Right, Lateral Forearm) 4. Dressing Applied: Other dressing (specify in notes) 5. Secondary Dressing Applied Gauze and Kerlix/Conform 7. Secured with Other (specify in notes) Notes Drawtex, gauze and kerlix and coban Electronic  Signature(s) Signed: 01/10/2015 10:26:34 AM By: Regan Lemming BSN, RN Entered By: Regan Lemming on 01/10/2015 10:26:33 Philip [name] ([id number]) -------------------------------------------------------------------------------- [place] Details Patient Name: Philip [name], SEIDMAN. Date of Service: 01/10/2015 10:15 AM Medical Record Number: [id number] Patient Account Number: [id number] Date of Birth/Sex: [birth year] (81 y.o. Male) Treating RN: Baruch Gouty, RN, BSN, [place] Primary Care Physician: Hortencia Pilar Other Clinician: Referring Physician: Hortencia Pilar Treating Physician/Extender: Frann Rider in Treatment: 105 Vital Signs Time Taken: 10:16 Temperature (F): 97.8 Height (in): 69 Pulse (bpm): 77 Weight (lbs): 179 Respiratory Rate (breaths/min): 17 Body Mass Index (BMI): 26.4 Blood Pressure (mmHg): 128/48 Reference Range: 80 - 120 mg / dl Electronic Signature(s) Signed: 01/10/2015 10:17:51 AM By: Regan Lemming BSN, RN Entered By: Regan Lemming on 01/10/2015 10:17:51

## 2015-01-11 NOTE — Progress Notes (Signed)
Philip [name] ([id number]) Visit Report for 01/10/2015 Chief Complaint Document Details Patient Name: Philip [name], Philip [name]. Date of Service: 01/10/2015 10:15 AM Medical Record Number: [id number] Patient Account Number: [id number] Date of Birth/Sex: 1918-06-16 (81 y.o. Male) Treating RN: Baruch Gouty, RN, BSN, Velva Harman Primary Care Physician: Hortencia Pilar Other Clinician: Referring Physician: Hortencia Pilar Treating Physician/Extender: Frann Rider in Treatment: 64 Information Obtained from: Patient Chief Complaint R foot ulcer. L forearm ulcer. 07/19/2014 -- about 2 weeks ago he had a surgical procedure done by dermatologist in [place] and has an open surgical wound on the dorsum of the right foot. Electronic Signature(s) Signed: 01/10/2015 11:04:48 AM By: Christin Fudge MD, FACS Entered By: Christin Fudge on 01/10/2015 11:04:48 Philip [name] ([id number]) -------------------------------------------------------------------------------- Cellular or Tissue Based Product Details Patient Name: Philip [name]. Date of Service: 01/10/2015 10:15 AM Medical Record Number: [id number] Patient Account Number: [id number] Date of Birth/Sex: 29-Oct-1918 (81 y.o. Male) Treating RN: Baruch Gouty, RN, BSN, Velva Harman Primary Care Physician: Hortencia Pilar Other Clinician: Referring Physician: Hortencia Pilar Treating Physician/Extender: Frann Rider in Treatment: 36 Cellular or Tissue Based Wound #4 Left,Medial Malleolus Product Type Applied to: Performed By: Physician Pat Patrick., MD Cellular or Tissue Based Grafix prime Product Type: Time-Out Taken: Yes Location: genitalia / hands / feet / multiple digits Wound Size (sq cm): 6 Product Size (sq cm): 6 Waste Size (sq cm): 0 Amount of Product Applied (sq cm): 6 Lot #: [id number] Order #: [id number] Expiration Date: 08/22/2016 Fenestrated: No Reconstituted: Yes Solution Type: nacl Solution Amount: 30ml Lot #: [id number] Solution Expiration  08/17/2016 Date: Secured: Yes Secured With: Steri-Strips Dressing Applied: Yes Primary Dressing: gauze Procedural Pain: 0 Post Procedural Pain: 0 Response to Treatment: Procedure was tolerated well Post Procedure Diagnosis Same as Pre-procedure Electronic Signature(s) Signed: 01/10/2015 11:04:41 AM By: Christin Fudge MD, FACS Entered By: Christin Fudge on 01/10/2015 11:04:41 Philip [name] ([id number]) -------------------------------------------------------------------------------- HPI Details Patient Name: Philip [name]. Date of Service: 01/10/2015 10:15 AM Medical Record Number: [id number] Patient Account Number: [id number] Date of Birth/Sex: March 30, 1919 (81 y.o. Male) Treating RN: Afful, RN, BSN, Viola Primary Care Physician: Hortencia Pilar Other Clinician: Referring Physician: Hortencia Pilar Treating Physician/Extender: Frann Rider in Treatment: 47 History of Present Illness Location: right leg Duration: Dec 2015 Modifying Factors: history of an injury to the right leg with resulting hematoma and thrombophlebitis and later an ulcer of posterior leg Associated Signs and Symptoms: marked lymphedema of the right leg. He is already on Eloquis. HPI Description: 06/14/14 -- He returns for followup today. He denies any fevers. no fresh issues and his daughter says he's been doing fine. 06/21/14 -- after he sustained a fall this week earlier he applied a bandage over this himself and did not seek any medical attention. he did however manage to control the bleeding and had a dressing in place the next morning when his son to the visit. In this dressing was removed there was further damaged skin. His right leg has been doing fine otherwise. 07/12/14 --Very pleasant 81 year old with past medical history significant for congestive heart failure (EF 15%), peripheral vascular disease, and chronic kidney disease. He was hospitalized at [facility] in December 2015 for congestive heart failure.  He says that he fell during his hospital course and developed a hematoma over his right calf. He was also diagnosed with a right lower extremity DVT for which he takes Eliquis. The hematoma subsequently turned into an ulceration around Christmas, which has healed. He subsequently developed an ulcer on  his right dorsal foot and a traumatic left forearm ulcer. Per his report, he underwent biopsy of the right dorsal foot ulceration which demonstrated a skin cancer. His PCP and dermatologist office are both closed today. I reviewed his records in [place] but find no report of biopsy or pathology. He s without complaints today. No significant pain. No fever or chills. Minimal drainage. 07/19/2014 - the patient and his son tell me that about 2 weeks ago the dermatologist did a skin biopsy and this was a large area on the dorsum of his right foot which was left open and no dressing instructions were recommended. Since then he has been called and told that it is a cancer and the need to do a further procedure but that will not happen until about 2 weeks from now. In the meanwhile the patient has not been taking care of his right foot. The left forearm where he had an abrasion and laceration is doing very well. 07/26/2014 -- Reports from 07/08/2014 from the dermatology group reviewed. A excision was done of a lesion located on the dorsum of the right foot and this was 1.7 cm in diameter which was a shave biopsy performed. The wound was left open after appropriate cauterization and the patient was given this dressing instructions. 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 08/02/2014 -- all the original problems he came with have completely resolved. He now has a surgical wound on his right foot dorsum where a skin cancer was excised. He goes to see his dermatologist this JVON, MERONEY ([id number]) coming Tuesday  and will have definite news next Friday. 08/09/2014 he had gone to his dermatologist on Tuesday and she has injected the base of his ulcer with some chemotherapeutic agent. He was supposed to bring some papers with him but forgot to get them and will bring them in next week. Other than that the dermatologist had suggested using Mehdi honey on the wound. 08/16/2014 -- the patient has brought in his notes from the dermatologist and on 08/06/2014 he received a injection of 5 FU, 500 mg grams into the lesion. the pathology report was also sent and it was a squamous cell carcinoma well-differentiated and edges were involved. They wanted him to use many honey for the wound dressing changes to be done 3 times a week. 08/30/2014 -- he has finished his second injection of 5-FU and has the next one in 2 weeks' time. He is doing fine otherwise. 09/13/2014 - No new complaints. No significant pain. No fever or chills. Minimal drainage. Still receiving 5-FU injections. 09/20/2014 -- He was seen by the dermatologist on 09/10/2014 and Dr. Phillip Heal injected his foot both the right on the dorsum and left near the medial malleolus with 5-FU. The next dose of 5-FU is to be given after 3 months. The patient says he now has a spot on the left medial malleolus where he was injected with 5-FU. 09/27/2014 -- the area on the left ankle where he was injected with 5-FU is now a full-blown ulcer. He also has mild pain in both ankle areas. 10/04/2014 - large had a bit of a fall and injured his right arm last evening and has had a laceration with no evidence of any foreign body in the right forearm. 12/20/2014 -- he recently saw his dermatologist at [facility] and she was pleased with his wound healing on the right lower extremity. She has not  given him any further 5-FU injections and will see him back on a when necessary basis. 01/03/2015 -- his skin substitute Grafix has been approved by his insurance and we will go ahead with  this next week. 01/10/2015 -- he has his first application of Grafix today. he recently had a nightmare and injured his right forearm and had some abrasions. Electronic Signature(s) Signed: 01/10/2015 11:05:40 AM By: Christin Fudge MD, FACS Entered By: Christin Fudge on 01/10/2015 11:05:40 Philip [name] ([id number]) -------------------------------------------------------------------------------- Physical Exam Details Patient Name: JAMA, KRICHBAUM. Date of Service: 01/10/2015 10:15 AM Medical Record Number: [id number] Patient Account Number: [id number] Date of Birth/Sex: November 24, 1918 (81 y.o. Male) Treating RN: Baruch Gouty, RN, BSN, Velva Harman Primary Care Physician: Hortencia Pilar Other Clinician: Referring Physician: Hortencia Pilar Treating Physician/Extender: Frann Rider in Treatment: 36 Constitutional . Pulse regular. Respirations normal and unlabored. Afebrile. . Eyes Nonicteric. Reactive to light. Ears, Nose, Mouth, and Throat Lips, teeth, and gums WNL.Marland Kitchen Moist mucosa without lesions . Neck supple and nontender. No palpable supraclavicular or cervical adenopathy. Normal sized without goiter. Respiratory WNL. No retractions.. Cardiovascular Pedal Pulses WNL. No clubbing, cyanosis or edema. Lymphatic No adneopathy. No adenopathy. No adenopathy. Musculoskeletal Adexa without tenderness or enlargement.. Digits and nails w/o clubbing, cyanosis, infection, petechiae, ischemia, or inflammatory conditions.. Integumentary (Hair, Skin) No suspicious lesions. No crepitus or fluctuance. No peri-wound warmth or erythema. No masses.Marland Kitchen Psychiatric Judgement and insight Intact.. No evidence of depression, anxiety, or agitation.. Notes The right forearm again has a few superficial lacerations and abrasions. This is fairly superficial and we will treat it with draw texts and an appropriate bandage. The left medial ankle requires debridement prior to applying the skin substitute and this will be  done sharply with a curette. Electronic Signature(s) Signed: 01/10/2015 11:06:59 AM By: Christin Fudge MD, FACS Entered By: Christin Fudge on 01/10/2015 11:06:58 Philip [name] ([id number]) -------------------------------------------------------------------------------- Physician Orders Details Patient Name: HAMISH, BANKS. Date of Service: 01/10/2015 10:15 AM Medical Record Number: [id number] Patient Account Number: [id number] Date of Birth/Sex: 02-03-1919 (81 y.o. Male) Treating RN: Cornell Barman Primary Care Physician: Hortencia Pilar Other Clinician: Referring Physician: Hortencia Pilar Treating Physician/Extender: Frann Rider in Treatment: 61 Verbal / Phone Orders: Yes Clinician: Cornell Barman Read Back and Verified: Yes Diagnosis Coding Wound Cleansing Wound #4 Left,Medial Malleolus o Clean wound with Normal Saline. Wound #5R Right,Lateral Forearm o Clean wound with Normal Saline. Anesthetic Wound #4 Left,Medial Malleolus o Topical Lidocaine 4% cream applied to wound bed prior to debridement Wound #5R Right,Lateral Forearm o Topical Lidocaine 4% cream applied to wound bed prior to debridement Primary Wound Dressing Wound #5R Right,Lateral Forearm o Drawtex Secondary Dressing Wound #5R Right,Lateral Forearm o Gauze and Kerlix/Conform - lightly secured with Coban Dressing Change Frequency Wound #4 Left,Medial Malleolus o Change dressing every week Wound #5R Right,Lateral Forearm o Change dressing every week Follow-up Appointments Wound #4 Left,Medial Malleolus o Return Appointment in 1 week. Wound #5R Right,Lateral Forearm o Return Appointment in 1 week. CLARANCE, BOLLARD ([id number]) Home Health Wound #4 [place] Nurse may visit PRN to address patientos wound care needs. - Dressings do not need to be changed this week due to Grafix being applied to ankle. o FACE TO FACE ENCOUNTER: MEDICARE and MEDICAID PATIENTS: I  certify that this patient is under my care and that I had a face-to-face encounter that meets the physician face-to-face encounter requirements with this patient on this date. The encounter with the patient was in whole or  in part for the following MEDICAL CONDITION: (primary reason for Home Healthcare) MEDICAL NECESSITY: I certify, that based on my findings, NURSING services are a medically necessary home health service. HOME BOUND STATUS: I certify that my clinical findings support that this patient is homebound (i.e., Due to illness or injury, pt requires aid of supportive devices such as crutches, cane, wheelchairs, walkers, the use of special transportation or the assistance of another person to leave their place of residence. There is a normal inability to leave the home and doing so requires considerable and taxing effort. Other absences are for medical reasons / religious services and are infrequent or of short duration when for other reasons). o If current dressing causes regression in wound condition, may D/C ordered dressing product/s and apply Normal Saline Moist Dressing daily until next [place] / Other MD appointment. [place] of regression in wound condition at [phone]. o Please direct any NON-WOUND related issues/requests for orders to patient's Primary Care Physician Wound #5R Right,Lateral Forearm o Home Health Nurse may visit PRN to address patientos wound care needs. - Dressings do not need to be changed this week due to Grafix being applied to ankle. o FACE TO FACE ENCOUNTER: MEDICARE and MEDICAID PATIENTS: I certify that this patient is under my care and that I had a face-to-face encounter that meets the physician face-to-face encounter requirements with this patient on this date. The encounter with the patient was in whole or in part for the following MEDICAL CONDITION: (primary reason for [place]) MEDICAL NECESSITY:  I certify, that based on my findings, NURSING services are a medically necessary home health service. HOME BOUND STATUS: I certify that my clinical findings support that this patient is homebound (i.e., Due to illness or injury, pt requires aid of supportive devices such as crutches, cane, wheelchairs, walkers, the use of special transportation or the assistance of another person to leave their place of residence. There is a normal inability to leave the home and doing so requires considerable and taxing effort. Other absences are for medical reasons / religious services and are infrequent or of short duration when for other reasons). o If current dressing causes regression in wound condition, may D/C ordered dressing product/s and apply Normal Saline Moist Dressing daily until next [place] / Other MD appointment. [place] of regression in wound condition at [phone]. o Please direct any NON-WOUND related issues/requests for orders to patient's Primary Care Physician Advanced [employer]rapies Wound #4 Left,Medial Malleolus o Grafix Core application in clinic; including contact layer, fixation with steri strips, dry gauze and cover dressing. RAYMONT, ANDREONI ([id number]) Electronic Signature(s) Signed: 01/10/2015 4:15:54 PM By: Christin Fudge MD, FACS Signed: 01/10/2015 5:14:03 PM By: Gretta Cool RN, BSN, Kim RN, BSN Entered By: Gretta Cool, RN, BSN, Kim on 01/10/2015 10:50:44 Philip [name] ([id number]) -------------------------------------------------------------------------------- Problem List Details Patient Name: JUNIUS, FAUCETT. Date of Service: 01/10/2015 10:15 AM Medical Record Number: [id number] Patient Account Number: [id number] Date of Birth/Sex: [birth year] (81 y.o. Male) Treating RN: Baruch Gouty, RN, BSN, Velva Harman Primary Care Physician: Hortencia Pilar Other Clinician: Referring Physician: Hortencia Pilar Treating Physician/Extender: Frann Rider in  Treatment: 54 Active Problems ICD-10 Encounter Code Description Active Date Diagnosis I70.232 Atherosclerosis of native arteries of right leg with 06/21/2014 Yes ulceration of calf I82.401 Acute embolism and thrombosis of unspecified deep veins 06/21/2014 Yes of right lower extremity S91.301A Unspecified open wound, right foot, initial encounter 07/19/2014 Yes Z92.21 Personal history of antineoplastic chemotherapy 08/23/2014  Yes L97.522 Non-pressure chronic ulcer of other part of left foot with fat 09/20/2014 Yes layer exposed S41.111A Laceration without foreign body of right upper arm, initial 10/04/2014 Yes encounter Inactive Problems Resolved Problems ICD-10 Code Description Active Date Resolved Date L97.212 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 Date of Service: 01/10/2015 10:15 AM Medical Record Number: [id number] Patient Account Number: [id number] Date of Birth/Sex: 1918-12-21 (81 y.o. Male) Treating RN: Baruch Gouty, RN, BSN, Velva Harman Primary Care Physician: Hortencia Pilar Other Clinician: Referring Physician: Hortencia Pilar Treating Physician/Extender: Frann Rider in Treatment: 36 Subjective Chief Complaint Information obtained from Patient R foot ulcer. L forearm ulcer. 07/19/2014 -- about 2 weeks ago he had a surgical procedure done by dermatologist in [place] and has an open surgical wound on the dorsum of the right foot. History of Present Illness (HPI) The following HPI elements were documented for the patient's  wound: Location: right leg Duration: Dec 2015 Modifying Factors: history of an injury to the right leg with resulting hematoma and thrombophlebitis and later an ulcer of posterior leg Associated Signs and Symptoms: marked lymphedema of the right leg. He is already on Eloquis. 06/14/14 -- He returns for followup today. He denies any fevers. no fresh issues and his daughter says he's been doing fine. 06/21/14 -- after he sustained a fall this week earlier he applied a bandage over this himself and did not seek any medical attention. he did however manage to control the bleeding and had a dressing in place the next morning when his son to the visit. In this dressing was removed there was further damaged skin. His right leg has been doing fine otherwise. 07/12/14 --Very pleasant 81 year old with past medical history significant for congestive heart failure (EF 15%), peripheral vascular disease, and chronic kidney disease. He was hospitalized at [facility] in December 2015 for congestive heart failure. He says that he fell during his hospital course and developed a hematoma over his right calf. He was also diagnosed with a right lower extremity DVT for which he takes Eliquis. The hematoma subsequently turned into an ulceration around Christmas, which has healed. He subsequently developed an ulcer on his right dorsal foot and a traumatic left forearm ulcer. Per his report, he underwent biopsy of the right dorsal foot ulceration which demonstrated a skin cancer. His PCP and dermatologist office are both closed today. I reviewed his records in [place] but find no report of biopsy or pathology. He s without complaints today. No significant pain. No fever or chills. Minimal drainage. 07/19/2014 - the patient and his son tell me that about 2 weeks ago the dermatologist did a skin biopsy and this was a large area on the dorsum of his right foot which was left open and no dressing instructions were recommended. Since  then he has been called and told that it is a cancer and the need to do a further procedure but that will not happen until about 2 weeks from now. In the meanwhile the patient has not been taking care of his right foot. The left forearm where he had an abrasion and laceration is doing very well. GEARY, RUFO ([id number]) 07/26/2014 -- Reports from 07/08/2014 from the dermatology group  reviewed. A excision was done of a lesion located on the dorsum of the right foot and this was 1.7 cm in diameter which was a shave biopsy performed. The wound was left open after appropriate cauterization and the patient was given this dressing instructions. 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 08/02/2014 -- all the original problems he came with have completely resolved. He now has a surgical wound on his right foot dorsum where a skin cancer was excised. He goes to see his dermatologist this coming Tuesday and will have definite news next Friday. 08/09/2014 he had gone to his dermatologist on Tuesday and she has injected the base of his ulcer with some chemotherapeutic agent. He was supposed to bring some papers with him but forgot to get them and will bring them in next week. Other than that the dermatologist had suggested using Mehdi honey on the wound. 08/16/2014 -- the patient has brought in his notes from the dermatologist and on 08/06/2014 he received a injection of 5 FU, 500 mg grams into the lesion. the pathology report was also sent and it was a squamous cell carcinoma well-differentiated and edges were involved. They wanted him to use many honey for the wound dressing changes to be done 3 times a week. 08/30/2014 -- he has finished his second injection of 5-FU and has the next one in 2 weeks' time. He is doing fine otherwise. 09/13/2014 - No new complaints. No significant pain. No fever or chills.  Minimal drainage. Still receiving 5-FU injections. 09/20/2014 -- He was seen by the dermatologist on 09/10/2014 and Dr. Phillip Heal injected his foot both the right on the dorsum and left near the medial malleolus with 5-FU. The next dose of 5-FU is to be given after 3 months. The patient says he now has a spot on the left medial malleolus where he was injected with 5-FU. 09/27/2014 -- the area on the left ankle where he was injected with 5-FU is now a full-blown ulcer. He also has mild pain in both ankle areas. 10/04/2014 - large had a bit of a fall and injured his right arm last evening and has had a laceration with no evidence of any foreign body in the right forearm. 12/20/2014 -- he recently saw his dermatologist at [facility] and she was pleased with his wound healing on the right lower extremity. She has not given him any further 5-FU injections and will see him back on a when necessary basis. 01/03/2015 -- his skin substitute Grafix has been approved by his insurance and we will go ahead with this next week. 01/10/2015 -- he has his first application of Grafix today. he recently had a nightmare and injured his right forearm and had some abrasions. [address] NAMEER, SUMMER ([id number]) Constitutional Pulse regular. Respirations normal and unlabored. Afebrile. Vitals Time Taken: 10:16 AM, Height: 69 in, Weight: 179 lbs, BMI: 26.4, Temperature: 97.8 F, Pulse: 77 bpm, Respiratory Rate: 17 breaths/min, Blood Pressure: 128/48 mmHg. Eyes Nonicteric. Reactive to light. Ears, Nose, Mouth, and Throat Lips, teeth, and gums WNL.Marland Kitchen Moist mucosa without lesions . Neck supple and nontender. No palpable supraclavicular or cervical adenopathy. Normal sized without goiter. Respiratory WNL. No retractions.. Cardiovascular Pedal Pulses WNL. No clubbing, cyanosis or edema. Lymphatic No adneopathy. No adenopathy. No adenopathy. Musculoskeletal Adexa without tenderness or enlargement.. Digits and nails  w/o clubbing, cyanosis, infection, petechiae, ischemia, or inflammatory conditions.Marland Kitchen Psychiatric Judgement and insight Intact.Marland Kitchen No  evidence of depression, anxiety, or agitation.. General Notes: The right forearm again has a few superficial lacerations and abrasions. This is fairly superficial and we will treat it with draw texts and an appropriate bandage. The left medial ankle requires debridement prior to applying the skin substitute and this will be done sharply with a curette. Integumentary (Hair, Skin) No suspicious lesions. No crepitus or fluctuance. No peri-wound warmth or erythema. No masses.. Wound #4 status is Open. Original cause of wound was Other Lesion. The wound is located on the Left,Medial Malleolus. The wound measures 2.4cm length x 2.5cm width x 0.5cm depth; 4.712cm^2 area and 2.356cm^3 volume. There is tendon and fascia exposed. There is no tunneling or undermining noted. There is a small amount of serosanguineous drainage noted. The wound margin is distinct with the outline attached to the wound base. There is small (1-33%) pink granulation within the wound bed. There is a medium (34-66%) amount of necrotic tissue within the wound bed including Adherent Slough. The periwound skin appearance exhibited: Localized Edema, Moist, Erythema. The periwound skin appearance did not exhibit: Callus, Crepitus, Excoriation, Fluctuance, Friable, Induration, Rash, Scarring, Dry/Scaly, Maceration, Atrophie Blanche, Cyanosis, Ecchymosis, Hemosiderin Staining, Mottled, Pallor, Rubor. The surrounding wound skin color is noted with erythema which is circumferential. Periwound temperature was JAMARIS, THEARD. ([id number]) noted as No Abnormality. The periwound has tenderness on palpation. Wound #5R status is Open. Original cause of wound was Shear/Friction. The wound is located on the Right,Lateral Forearm. The wound measures 14.5cm length x 7cm width x 0.1cm depth; 79.718cm^2 area and  7.972cm^3 volume. The wound is limited to skin breakdown. There is no tunneling noted. There is a medium amount of serosanguineous drainage noted. The wound margin is flat and intact. There is large (67-100%) granulation within the wound bed. There is no necrotic tissue within the wound bed. The periwound skin appearance exhibited: Maceration, Moist. The periwound skin appearance did not exhibit: Callus, Crepitus, Excoriation, Fluctuance, Friable, Induration, Localized Edema, Rash, Scarring, Dry/Scaly, Atrophie Blanche, Cyanosis, Ecchymosis, Hemosiderin Staining, Mottled, Pallor, Rubor, Erythema. 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 This is fairly superficial and we will treat it with draw texts and an appropriate bandage. The left medial ankle was debrided sharply with a curette and then Grafix was applied with the usual precautions and bolstered in place. He will come again to see as next week. 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 A skin graft procedure using a bioengineered skin substitute/cellular or tissue based product was performed by Britto, Jackson Latino., MD. Grafix prime was applied and secured with Steri-Strips. 6 sq cm of product was utilized and 0 sq cm was wasted. Post Application, gauze was applied. A Time Out was conducted prior to the start of the procedure. The procedure was tolerated well with a pain  level of 0 throughout and a pain level of 0 following the procedure. Post procedure Diagnosis Wound #4: Same as Pre-Procedure . PROPHET, RENWICK ([id number]) Plan Wound Cleansing: Wound #4 Left,Medial Malleolus: Clean wound  with Normal Saline. Wound #5R Right,Lateral Forearm: Clean wound with Normal Saline. Anesthetic: Wound #4 Left,Medial Malleolus: Topical Lidocaine 4% cream applied to wound bed prior to debridement Wound #5R Right,Lateral Forearm: Topical Lidocaine 4% cream applied to wound bed prior to debridement Primary Wound Dressing: Wound #5R Right,Lateral Forearm: Drawtex Secondary Dressing: Wound #5R Right,Lateral Forearm: Gauze and Kerlix/Conform - lightly secured with [place] Dressing Change Frequency: Wound #4 Left,Medial Malleolus: Change dressing every week Wound #5R Right,Lateral Forearm: Change dressing every week Follow-up Appointments: Wound #4 Left,Medial Malleolus: Return Appointment in 1 week. Wound #5R Right,Lateral Forearm: Return Appointment in 1 week. Home Health: Wound #4 Left,Medial Malleolus: Home Health Nurse may visit PRN to address patient s wound care needs. - Dressings do not need to be changed this week due to Grafix being applied to ankle. FACE TO FACE ENCOUNTER: MEDICARE and MEDICAID PATIENTS: I certify that this patient is under my care and that I had a face-to-face encounter that meets the physician face-to-face encounter requirements with this patient on this date. The encounter with the patient was in whole or in part for the following MEDICAL CONDITION: (primary reason for [place]) MEDICAL NECESSITY: I certify, that based on my findings, NURSING services are a medically necessary home health service. HOME BOUND STATUS: I certify that my clinical findings support that this patient is homebound (i.e., Due to illness or injury, pt requires aid of supportive devices such as crutches, cane, wheelchairs, walkers, the use of special  transportation or the assistance of another person to leave their place of residence. There is a normal inability to leave the home and doing so requires considerable and taxing effort. Other absences are for medical reasons / religious services and are infrequent or of short duration when for other reasons). If current dressing causes regression in wound condition, may D/C ordered dressing product/s and apply Normal Saline Moist Dressing daily until next [place] / Other MD appointment. [place] of regression in wound condition at [phone]. Please direct any NON-WOUND related issues/requests for orders to patient's Primary Care Physician Wound #5R Right,Lateral Forearm: TYVION, EDMONDSON ([id number]) [facility] Nurse may visit PRN to address patient s wound care needs. - Dressings do not need to be changed this week due to Grafix being applied to ankle. FACE TO FACE ENCOUNTER: MEDICARE and MEDICAID PATIENTS: I certify that this patient is under my care and that I had a face-to-face encounter that meets the physician face-to-face encounter requirements with this patient on this date. The encounter with the patient was in whole or in part for the following MEDICAL CONDITION: (primary reason for [place]) MEDICAL NECESSITY: I certify, that based on my findings, NURSING services are a medically necessary home health service. HOME BOUND STATUS: I certify that my clinical findings support that this patient is homebound (i.e., Due to illness or injury, pt requires aid of supportive devices such as crutches, cane, wheelchairs, walkers, the use of special transportation or the assistance of another person to leave their place of residence. There is a normal inability to leave the home and doing so requires considerable and taxing effort. Other absences are for medical reasons / religious services and are infrequent or of short duration when for other reasons). If  current dressing causes regression in wound condition, may D/C ordered dressing product/s and apply Normal Saline Moist Dressing daily until next [place] / Other MD appointment. [place] of regression in wound condition at [phone]. Please  direct any NON-WOUND related issues/requests for orders to patient's Primary Care Physician Advanced Therapies: Wound #4 Left,Medial Malleolus: Grafix Core application in clinic; including contact layer, fixation with steri strips, dry gauze and cover dressing. The right forearm again has a few superficial lacerations and abrasions. This is fairly superficial and we will treat it with draw texts and an appropriate bandage. The left medial ankle was debrided sharply with a curette and then Grafix was applied with the usual precautions and bolstered in place. He will come again to see as next week. Electronic Signature(s) Signed: 01/10/2015 11:08:51 AM By: Christin Fudge MD, FACS Entered By: Christin Fudge on 01/10/2015 11:08:51 Philip [name] ([id number]) -------------------------------------------------------------------------------- SuperBill Details Patient Name: CHRISTPHOR, GROFT. Date of Service: 01/10/2015 Medical Record Number: [id number] Patient Account Number: [id number] Date of Birth/Sex: 1918-10-30 (81 y.o. Male) Treating RN: Baruch Gouty, RN, BSN, [place] Primary Care Physician: Hortencia Pilar Other Clinician: Referring Physician: Hortencia Pilar Treating Physician/Extender: Frann Rider in Treatment: 36 Diagnosis Coding ICD-10 Codes Code Description [phone] 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 I82.401 Acute embolism and thrombosis of unspecified deep veins of right lower extremity S91.301A Unspecified open wound, right foot, initial encounter Z92.21 Personal history of antineoplastic chemotherapy L97.522 Non-pressure chronic ulcer of other part of left foot with fat layer  exposed S41.111A Laceration without foreign body of right upper arm, initial encounter Facility Procedures The patient participates with Medicare or their insurance follows the Medicare Facility Guidelines: CPT4 Code Description Modifier Quantity [id number] (Facility Use Only) Grafix Prime 1 SQ CM 6 The patient participates with Medicare or their insurance follows the Medicare Facility Guidelines: [id number] - SKIN SUB GRAFT FACE/NK/HF/G 1 ICD-10 Description Diagnosis L97.522 Non-pressure chronic ulcer of other part of left foot with fat layer  exposed I70.232 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 Z92.21 Personal history of antineoplastic chemotherapy Physician Procedures CPT4 Code Description: [id number] - WC PHYS SKIN SUB GRAFT FACE/NK/HF/G ICD-10 Description Diagnosis L97.522 Non-pressure chronic ulcer of other part of left foot with I70.232 Atherosclerosis of native arteries of right leg with ulcera Z92.21 Personal  history of antineoplastic chemotherapy Modifier: fat layer e tion of cal Quantity: 1 xposed f Electronic Signature(s) Signed: 01/10/2015 11:09:13 AM By: Christin Fudge MD, FACS Entered By: Christin Fudge on 01/10/2015 [place], [place] ([id number])

## 2015-01-13 ENCOUNTER — Ambulatory Visit
Admission: RE | Admit: 2015-01-13 | Discharge: 2015-01-13 | Disposition: A | Discharge status: Home / Self Care | Payer: Medicare Other | Source: Ambulatory Visit | Attending: Surgery | Admitting: Surgery

## 2015-01-13 ENCOUNTER — Encounter: Payer: Medicare Other | Admitting: Surgery

## 2015-01-13 ENCOUNTER — Other Ambulatory Visit: Payer: Self-pay | Admitting: Surgery

## 2015-01-13 DIAGNOSIS — M79662 Pain in left lower leg: Secondary | ICD-10-CM

## 2015-01-13 DIAGNOSIS — M79605 Pain in left leg: Secondary | ICD-10-CM | POA: Diagnosis present

## 2015-01-13 DIAGNOSIS — L97522 Non-pressure chronic ulcer of other part of left foot with fat layer exposed: Secondary | ICD-10-CM | POA: Diagnosis not present

## 2015-01-13 DIAGNOSIS — M7989 Other specified soft tissue disorders: Secondary | ICD-10-CM | POA: Insufficient documentation

## 2015-01-15 NOTE — Progress Notes (Signed)
NYSIR, FERGUSSON ([id number]) Visit Report for 01/13/2015 Chief Complaint Document Details Patient Name: Philip [name], Philip [name]. Date of Service: 01/13/2015 10:15 AM Medical Record Number: [id number] Patient Account Number: [id number] Date of Birth/Sex: 01-02-1919 (81 y.o. Male) Treating RN: Montey Hora Primary Care Physician: Hortencia Pilar Other Clinician: Referring Physician: Hortencia Pilar Treating Physician/Extender: Frann Rider in Treatment: 75 Information Obtained from: Patient Chief Complaint R foot ulcer. L forearm ulcer. 07/19/2014 -- about 2 weeks ago he had a surgical procedure done by dermatologist in [place] and has an open surgical wound on the dorsum of the right foot. Electronic Signature(s) Signed: 01/13/2015 10:53:42 AM By: Christin Fudge MD, FACS Entered By: Christin Fudge on 01/13/2015 10:53:42 Philip [name] ([id number]) -------------------------------------------------------------------------------- HPI Details Patient Name: Philip [name], Philip [name]. Date of Service: 01/13/2015 10:15 AM Medical Record Number: [id number] Patient Account Number: [id number] Date of Birth/Sex: 1918/10/18 (81 y.o. Male) Treating RN: Montey Hora Primary Care Physician: Hortencia Pilar Other Clinician: Referring Physician: Hortencia Pilar Treating Physician/Extender: Frann Rider in Treatment: 37 History of Present Illness Location: right leg Duration: Dec 2015 Modifying Factors: history of an injury to the right leg with resulting hematoma and thrombophlebitis and later an ulcer of posterior leg Associated Signs and Symptoms: marked lymphedema of the right leg. He is already on Eloquis. HPI Description: 06/14/14 -- He returns for followup today. He denies any fevers. no fresh issues and his daughter says he's been doing fine. 06/21/14 -- after he sustained a fall this week earlier he applied a bandage over this himself and did not seek any medical attention. he did however manage to  control the bleeding and had a dressing in place the next morning when his son to the visit. In this dressing was removed there was further damaged skin. His right leg has been doing fine otherwise. 07/12/14 --Very pleasant 81 year old with past medical history significant for congestive heart failure (EF 15%), peripheral vascular disease, and chronic kidney disease. He was hospitalized at [facility] in December 2015 for congestive heart failure. He says that he fell during his hospital course and developed a hematoma over his right calf. He was also diagnosed with a right lower extremity DVT for which he takes Eliquis. The hematoma subsequently turned into an ulceration around Christmas, which has healed. He subsequently developed an ulcer on his right dorsal foot and a traumatic left forearm ulcer. Per his report, he underwent biopsy of the right dorsal foot ulceration which demonstrated a skin cancer. His PCP and dermatologist office are both closed today. I reviewed his records in [place] but find no report of biopsy or pathology. He s without complaints today. No significant pain. No fever or chills. Minimal drainage. 07/19/2014 - the patient and his son tell me that about 2 weeks ago the dermatologist did a skin biopsy and this was a large area on the dorsum of his right foot which was left open and no dressing instructions were recommended. Since then he has been called and told that it is a cancer and the need to do a further procedure but that will not happen until about 2 weeks from now. In the meanwhile the patient has not been taking care of his right foot. The left forearm where he had an abrasion and laceration is doing very well. 07/26/2014 -- Reports from 07/08/2014 from the dermatology group reviewed. A excision was done of a lesion located on the dorsum of the right foot and this was 1.7 cm in diameter which was a shave  biopsy performed. The wound was left open after appropriate  cauterization and the patient was given this dressing instructions. 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 08/02/2014 -- all the original problems he came with have completely resolved. He now has a surgical wound on his right foot dorsum where a skin cancer was excised. He goes to see his dermatologist this Philip [name], Philip [name] ([id number]) coming Tuesday and will have definite news next Friday. 08/09/2014 he had gone to his dermatologist on Tuesday and she has injected the base of his ulcer with some chemotherapeutic agent. He was supposed to bring some papers with him but forgot to get them and will bring them in next week. Other than that the dermatologist had suggested using Mehdi honey on the wound. 08/16/2014 -- the patient has brought in his notes from the dermatologist and on 08/06/2014 he received a injection of 5 FU, 500 mg grams into the lesion. the pathology report was also sent and it was a squamous cell carcinoma well-differentiated and edges were involved. They wanted him to use many honey for the wound dressing changes to be done 3 times a week. 08/30/2014 -- he has finished his second injection of 5-FU and has the next one in 2 weeks' time. He is doing fine otherwise. 09/13/2014 - No new complaints. No significant pain. No fever or chills. Minimal drainage. Still receiving 5-FU injections. 09/20/2014 -- He was seen by the dermatologist on 09/10/2014 and Dr. Phillip Heal injected his foot both the right on the dorsum and left near the medial malleolus with 5-FU. The next dose of 5-FU is to be given after 3 months. The patient says he now has a spot on the left medial malleolus where he was injected with 5-FU. 09/27/2014 -- the area on the left ankle where he was injected with 5-FU is now a full-blown ulcer. He also has mild pain in both ankle areas. 10/04/2014 - large had a bit of a fall and  injured his right arm last evening and has had a laceration with no evidence of any foreign body in the right forearm. 12/20/2014 -- he recently saw his dermatologist at [facility] and she was pleased with his wound healing on the right lower extremity. She has not given him any further 5-FU injections and will see him back on a when necessary basis. 01/03/2015 -- his skin substitute Grafix has been approved by his insurance and we will go ahead with this next week. 01/10/2015 -- he has his first application of Grafix today. he recently had a nightmare and injured his right forearm and had some abrasions. 01/13/2015 -- Philip [name] has developed significant swelling of the left calf with some tenderness of the calf. This was only noticed this morning. Addendum: The DVT study done in the hospital -- IMPRESSION:No evidence of deep venous thrombosis left lower extremity. The result was called into the patient's son who acknowledges the report and will bring the patient back on Friday Electronic Signature(s) Signed: 01/13/2015 12:54:40 PM By: Christin Fudge MD, FACS Previous Signature: 01/13/2015 10:54:32 AM Version By: Christin Fudge MD, FACS Previous Signature: 01/13/2015 10:54:15 AM Version By: Christin Fudge MD, FACS Entered By: Christin Fudge on 01/13/2015 12:54:40 Philip [name] ([id number]) -------------------------------------------------------------------------------- Physical Exam Details Patient Name: ZAVON, HYSON. Date of Service: 01/13/2015 10:15 AM Medical Record Number: [id number] Patient Account Number: [id number] Date of Birth/Sex: Dec 09, 1918 (81 y.o. Male) Treating RN: Montey Hora Primary Care  Physician: Hortencia Pilar Other Clinician: Referring Physician: Hortencia Pilar Treating Physician/Extender: Frann Rider in Treatment: 36 Constitutional . Pulse regular. Respirations normal and unlabored. Afebrile. . Eyes Nonicteric. Reactive to light. Ears, Nose, Mouth, and  Throat Lips, teeth, and gums WNL.Marland Kitchen Moist mucosa without lesions . Neck supple and nontender. No palpable supraclavicular or cervical adenopathy. Normal sized without goiter. Respiratory WNL. No retractions.. Cardiovascular Pedal Pulses WNL. there is significant edema in the region of the calf with tenderness and fluid in the subcutaneous tissue.Marland Kitchen Lymphatic No adneopathy. No adenopathy. No adenopathy. Musculoskeletal Adexa without tenderness or enlargement.. Digits and nails w/o clubbing, cyanosis, infection, petechiae, ischemia, or inflammatory conditions.. Integumentary (Hair, Skin) No suspicious lesions. No crepitus or fluctuance. No peri-wound warmth or erythema. No masses.Marland Kitchen Psychiatric Judgement and insight Intact.. No evidence of depression, anxiety, or agitation.. Notes the wound itself looks very good and there is no localized cellulitis or tenderness around the ankle. The swelling and fluid collection is proximal to the wrap which was placed on his ankle and this may be due to fluid correction proximal to the wrap due to the compression defect at the ankle. Electronic Signature(s) Signed: 01/13/2015 10:56:05 AM By: Christin Fudge MD, FACS Entered By: Christin Fudge on 01/13/2015 10:56:04 Philip [name] ([id number]) -------------------------------------------------------------------------------- Physician Orders Details Patient Name: Philip [name], Philip [name]. Date of Service: 01/13/2015 10:15 AM Medical Record Number: [id number] Patient Account Number: [id number] Date of Birth/Sex: [birth year] (81 y.o. Male) Treating RN: Cornell Barman Primary Care Physician: Hortencia Pilar Other Clinician: Referring Physician: Hortencia Pilar Treating Physician/Extender: Frann Rider in Treatment: 21 Verbal / Phone Orders: Yes Clinician: Cornell Barman Read Back and Verified: Yes Diagnosis Coding Wound Cleansing Wound #4 Left,Medial Malleolus o Clean wound with Normal Saline. Secondary  Dressing Wound #4 Left,Medial Malleolus o Gauze and Kerlix/Conform - lightly secured with Coban Dressing Change Frequency Wound #4 Left,Medial Malleolus o Change dressing every week Follow-up Appointments Wound #4 Left,Medial Malleolus o Other: - Friday Wound #5R Right,Lateral Forearm o Other: - Friday Home Health Wound #4 [place] Nurse may visit PRN to address patientos wound care needs. - Dressings do not need to be changed this week due to Grafix being applied to ankle. o FACE TO FACE ENCOUNTER: MEDICARE and MEDICAID PATIENTS: I certify that this patient is under my care and that I had a face-to-face encounter that meets the physician face-to-face encounter requirements with this patient on this date. The encounter with the patient was in whole or in part for the following MEDICAL CONDITION: (primary reason for [place]) MEDICAL NECESSITY: I certify, that based on my findings, NURSING services are a medically necessary home health service. HOME BOUND STATUS: I certify that my clinical findings support that this patient is homebound (i.e., Due to illness or injury, pt requires aid of supportive devices such as crutches, cane, wheelchairs, walkers, the use of special transportation or the assistance of another person to leave their place of residence. There is a normal inability to leave the home and doing so requires considerable and taxing effort. Other absences are for medical reasons / religious services and are infrequent or of short duration when for other reasons). BLYTHE, HARTSHORN ([id number]) o If current dressing causes regression in wound condition, may D/C ordered dressing product/s and apply Normal Saline Moist Dressing daily until next [place] / Other MD appointment. [place] of regression in wound condition at [phone]. o Please direct any NON-WOUND related issues/requests for orders  to  patient's Primary Care Physician Wound #5R Right,Lateral Forearm o Home Health Nurse may visit PRN to address patientos wound care needs. - Dressings do not need to be changed this week due to Grafix being applied to ankle. o FACE TO FACE ENCOUNTER: MEDICARE and MEDICAID PATIENTS: I certify that this patient is under my care and that I had a face-to-face encounter that meets the physician face-to-face encounter requirements with this patient on this date. The encounter with the patient was in whole or in part for the following MEDICAL CONDITION: (primary reason for [place]) MEDICAL NECESSITY: I certify, that based on my findings, NURSING services are a medically necessary home health service. HOME BOUND STATUS: I certify that my clinical findings support that this patient is homebound (i.e., Due to illness or injury, pt requires aid of supportive devices such as crutches, cane, wheelchairs, walkers, the use of special transportation or the assistance of another person to leave their place of residence. There is a normal inability to leave the home and doing so requires considerable and taxing effort. Other absences are for medical reasons / religious services and are infrequent or of short duration when for other reasons). o If current dressing causes regression in wound condition, may D/C ordered dressing product/s and apply Normal Saline Moist Dressing daily until next [place] / Other MD appointment. [place] of regression in wound condition at [phone]. o Please direct any NON-WOUND related issues/requests for orders to patient's Primary Care Physician Consults o Vascular - DVT study left lower leg oooo Electronic Signature(s) Signed: 01/13/2015 4:29:43 PM By: Christin Fudge MD, FACS Signed: 01/14/2015 5:26:34 PM By: Gretta Cool RN, BSN, Kim RN, BSN Entered By: Gretta Cool, RN, BSN, Kim on 01/13/2015 10:49:10 Philip [name]  ([id number]) -------------------------------------------------------------------------------- Problem List Details Patient Name: Philip [name], Philip [name]. Date of Service: 01/13/2015 10:15 AM Medical Record Number: [id number] Patient Account Number: [id number] Date of Birth/Sex: 1919/03/28 (81 y.o. Male) Treating RN: Montey Hora Primary Care Physician: Hortencia Pilar Other Clinician: Referring Physician: Hortencia Pilar Treating Physician/Extender: Frann Rider in Treatment: 9 Active Problems ICD-10 Encounter Code Description Active Date Diagnosis I70.232 Atherosclerosis of native arteries of right leg with 06/21/2014 Yes ulceration of calf I82.401 Acute embolism and thrombosis of unspecified deep veins 06/21/2014 Yes of right lower extremity S91.301A Unspecified open wound, right foot, initial encounter 07/19/2014 Yes Z92.21 Personal history of antineoplastic chemotherapy 08/23/2014 Yes L97.522 Non-pressure chronic ulcer of other part of left foot with fat 09/20/2014 Yes layer exposed S41.111A Laceration without foreign body of right upper arm, initial 10/04/2014 Yes encounter Inactive Problems Resolved Problems ICD-10 Code Description Active Date Resolved Date L97.212 Non-pressure chronic ulcer of right calf with fat layer 06/21/2014 06/21/2014 exposed S51.812A Laceration without foreign body of left forearm, initial 06/21/2014 06/21/2014 encounter Philip [name], Philip [name] ([id number]) Electronic Signature(s) Signed: 01/13/2015 10:53:28 AM By: Christin Fudge MD, FACS Entered By: Christin Fudge on 01/13/2015 10:53:27 Philip [name] ([id number]) -------------------------------------------------------------------------------- Progress Note Details Patient Name: Philip [name]. Date of Service: 01/13/2015 10:15 AM Medical Record Number: [id number] Patient Account Number: [id number] Date of Birth/Sex: [birth year] (81 y.o. Male) Treating RN: Montey Hora Primary Care Physician: Hortencia Pilar Other  Clinician: Referring Physician: Hortencia Pilar Treating Physician/Extender: Frann Rider in Treatment: 23 Subjective Chief Complaint Information obtained from Patient R foot ulcer. L forearm ulcer. 07/19/2014 -- about 2 weeks ago he had a surgical procedure done by dermatologist in [place] and has an open surgical wound on the dorsum of the right foot. History  of Present Illness (HPI) The following HPI elements were documented for the patient's wound: Location: right leg Duration: Dec 2015 Modifying Factors: history of an injury to the right leg with resulting hematoma and thrombophlebitis and later an ulcer of posterior leg Associated Signs and Symptoms: marked lymphedema of the right leg. He is already on Eloquis. 06/14/14 -- He returns for followup today. He denies any fevers. no fresh issues and his daughter says he's been doing fine. 06/21/14 -- after he sustained a fall this week earlier he applied a bandage over this himself and did not seek any medical attention. he did however manage to control the bleeding and had a dressing in place the next morning when his son to the visit. In this dressing was removed there was further damaged skin. His right leg has been doing fine otherwise. 07/12/14 --Very pleasant 81 year old with past medical history significant for congestive heart failure (EF 15%), peripheral vascular disease, and chronic kidney disease. He was hospitalized at [facility] in December 2015 for congestive heart failure. He says that he fell during his hospital course and developed a hematoma over his right calf. He was also diagnosed with a right lower extremity DVT for which he takes Eliquis. The hematoma subsequently turned into an ulceration around Christmas, which has healed. He subsequently developed an ulcer on his right dorsal foot and a traumatic left forearm ulcer. Per his report, he underwent biopsy of the right dorsal foot ulceration which demonstrated a skin  cancer. His PCP and dermatologist office are both closed today. I reviewed his records in [place] but find no report of biopsy or pathology. He s without complaints today. No significant pain. No fever or chills. Minimal drainage. 07/19/2014 - the patient and his son tell me that about 2 weeks ago the dermatologist did a skin biopsy and this was a large area on the dorsum of his right foot which was left open and no dressing instructions were recommended. Since then he has been called and told that it is a cancer and the need to do a further procedure but that will not happen until about 2 weeks from now. In the meanwhile the patient has not been taking care of his right foot. The left forearm where he had an abrasion and laceration is doing very well. Philip [name], Philip [name] ([id number]) 07/26/2014 -- Reports from 07/08/2014 from the dermatology group reviewed. A excision was done of a lesion located on the dorsum of the right foot and this was 1.7 cm in diameter which was a shave biopsy performed. The wound was left open after appropriate cauterization and the patient was given this dressing instructions. 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 08/02/2014 -- all the original problems he came with have completely resolved. He now has a surgical wound on his right foot dorsum where a skin cancer was excised. He goes to see his dermatologist this coming Tuesday and will have definite news next Friday. 08/09/2014 he had gone to his dermatologist on Tuesday and she has injected the base of his ulcer with some chemotherapeutic agent. He was supposed to bring some papers with him but forgot to get them and will bring them in next week. Other than that the dermatologist had suggested using Mehdi honey on the wound. 08/16/2014 -- the patient has brought in his notes from the dermatologist and on 08/06/2014 he received  a injection of 5 FU, 500 mg  grams into the lesion. the pathology report was also sent and it was a squamous cell carcinoma well-differentiated and edges were involved. They wanted him to use many honey for the wound dressing changes to be done 3 times a week. 08/30/2014 -- he has finished his second injection of 5-FU and has the next one in 2 weeks' time. He is doing fine otherwise. 09/13/2014 - No new complaints. No significant pain. No fever or chills. Minimal drainage. Still receiving 5-FU injections. 09/20/2014 -- He was seen by the dermatologist on 09/10/2014 and Dr. Phillip Heal injected his foot both the right on the dorsum and left near the medial malleolus with 5-FU. The next dose of 5-FU is to be given after 3 months. The patient says he now has a spot on the left medial malleolus where he was injected with 5-FU. 09/27/2014 -- the area on the left ankle where he was injected with 5-FU is now a full-blown ulcer. He also has mild pain in both ankle areas. 10/04/2014 - large had a bit of a fall and injured his right arm last evening and has had a laceration with no evidence of any foreign body in the right forearm. 12/20/2014 -- he recently saw his dermatologist at [facility] and she was pleased with his wound healing on the right lower extremity. She has not given him any further 5-FU injections and will see him back on a when necessary basis. 01/03/2015 -- his skin substitute Grafix has been approved by his insurance and we will go ahead with this next week. 01/10/2015 -- he has his first application of Grafix today. he recently had a nightmare and injured his right forearm and had some abrasions. 01/13/2015 -- Philip [name] has developed significant swelling of the left calf with some tenderness of the calf. This was only noticed this morning. Addendum: The DVT study done in the hospital -- IMPRESSION:No evidence of deep venous thrombosis left lower extremity. The result was called into the  patient's son who acknowledges the report and will bring the patient back on Friday Philip [name], Philip [name]. ([id number]) Objective Constitutional Pulse regular. Respirations normal and unlabored. Afebrile. Vitals Time Taken: 10:25 AM, Height: 69 in, Weight: 179 lbs, BMI: 26.4, Temperature: 97.6 F, Pulse: 68 bpm, Respiratory Rate: 18 breaths/min, Blood Pressure: 141/45 mmHg. Eyes Nonicteric. Reactive to light. Ears, Nose, Mouth, and Throat Lips, teeth, and gums WNL.Marland Kitchen Moist mucosa without lesions . Neck supple and nontender. No palpable supraclavicular or cervical adenopathy. Normal sized without goiter. Respiratory WNL. No retractions.. Cardiovascular Pedal Pulses WNL. there is significant edema in the region of the calf with tenderness and fluid in the subcutaneous tissue.Marland Kitchen Lymphatic No adneopathy. No adenopathy. No adenopathy. Musculoskeletal Adexa without tenderness or enlargement.. Digits and nails w/o clubbing, cyanosis, infection, petechiae, ischemia, or inflammatory conditions.Marland Kitchen Psychiatric Judgement and insight Intact.. No evidence of depression, anxiety, or agitation.. General Notes: the wound itself looks very good and there is no localized cellulitis or tenderness around the ankle. The swelling and fluid collection is proximal to the wrap which was placed on his ankle and this may be due to fluid correction proximal to the wrap due to the compression defect at the ankle. Integumentary (Hair, Skin) No suspicious lesions. No crepitus or fluctuance. No peri-wound warmth or erythema. No masses.Philip [name] ([id number]) 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 I82.401 - Acute embolism and thrombosis of unspecified deep veins of right lower extremity S91.301A - Unspecified open  wound, right foot, initial encounter Z92.21 - Personal history of antineoplastic chemotherapy L97.522 - Non-pressure chronic  ulcer of other part of left foot with fat layer exposed S41.111A - Laceration without foreign body of right upper arm, initial encounter I have recommended a lighter dressing and wrap in the region of the ankle and I have requested a DVT study to be done ASAP. Depending on this study we will have them see the PCP if required. Discussions have been had with the patient and we will call him back with the results today. If the DVT study is negative we will continue with elevation of the limb, his blood thinners and see me back on Friday. Plan Wound Cleansing: Wound #4 Left,Medial Malleolus: Clean wound with Normal Saline. Secondary Dressing: Wound #4 Left,Medial Malleolus: Gauze and Kerlix/Conform - lightly secured with Coban Dressing Change Frequency: Wound #4 Left,Medial Malleolus: Change dressing every week Follow-up Appointments: Wound #4 Left,Medial Malleolus: Other: - Friday Wound #5R Right,Lateral Forearm: Other: - Friday Home Health: Wound #4 Left,Medial Malleolus: Home Health Nurse may visit PRN to address patient s wound care needs. - Dressings do not need to be changed this week due to Grafix being applied to ankle. Philip [name], Philip [name] ([id number]) FACE TO FACE ENCOUNTER: MEDICARE and MEDICAID PATIENTS: I certify that this patient is under my care and that I had a face-to-face encounter that meets the physician face-to-face encounter requirements with this patient on this date. The encounter with the patient was in whole or in part for the following MEDICAL CONDITION: (primary reason for [place]) MEDICAL NECESSITY: I certify, that based on my findings, NURSING services are a medically necessary home health service. HOME BOUND STATUS: I certify that my clinical findings support that this patient is homebound (i.e., Due to illness or injury, pt requires aid of supportive devices such as crutches, cane, wheelchairs, walkers, the use of special transportation or the  assistance of another person to leave their place of residence. There is a normal inability to leave the home and doing so requires considerable and taxing effort. Other absences are for medical reasons / religious services and are infrequent or of short duration when for other reasons). If current dressing causes regression in wound condition, may D/C ordered dressing product/s and apply Normal Saline Moist Dressing daily until next [place] / Other MD appointment. [place] of regression in wound condition at [phone]. Please direct any NON-WOUND related issues/requests for orders to patient's Primary Care Physician Wound #5R Right,Lateral Forearm: Home Health Nurse may visit PRN to address patient s wound care needs. - Dressings do not need to be changed this week due to Grafix being applied to ankle. FACE TO FACE ENCOUNTER: MEDICARE and MEDICAID PATIENTS: I certify that this patient is under my care and that I had a face-to-face encounter that meets the physician face-to-face encounter requirements with this patient on this date. The encounter with the patient was in whole or in part for the following MEDICAL CONDITION: (primary reason for [place]) MEDICAL NECESSITY: I certify, that based on my findings, NURSING services are a medically necessary home health service. HOME BOUND STATUS: I certify that my clinical findings support that this patient is homebound (i.e., Due to illness or injury, pt requires aid of supportive devices such as crutches, cane, wheelchairs, walkers, the use of special transportation or the assistance of another person to leave their place of residence. There is a normal inability to leave the home and doing so  requires considerable and taxing effort. Other absences are for medical reasons / religious services and are infrequent or of short duration when for other reasons). If current dressing causes regression in wound  condition, may D/C ordered dressing product/s and apply Normal Saline Moist Dressing daily until next [place] / Other MD appointment. [place] of regression in wound condition at [phone]. Please direct any NON-WOUND related issues/requests for orders to patient's Primary Care Physician Consults ordered were: Vascular - DVT study left lower leg I have recommended a lighter dressing and wrap in the region of the ankle and I have requested a DVT study to be done ASAP. Depending on this study we will have them see the PCP if required. Discussions have been had with the patient and we will call him back with the results today. If the DVT study is negative we will continue with elevation of the limb, his blood thinners and see me back on Friday. Philip [name], Philip [name] ([id number]) Electronic Signature(s) Signed: 01/13/2015 12:54:59 PM By: Christin Fudge MD, FACS Previous Signature: 01/13/2015 10:57:23 AM Version By: Christin Fudge MD, FACS Entered By: Christin Fudge on 01/13/2015 12:54:59 Philip [name] ([id number]) -------------------------------------------------------------------------------- SuperBill Details Patient Name: ACHERON, SUGG. Date of Service: 01/13/2015 Medical Record Number: [id number] Patient Account Number: [id number] Date of Birth/Sex: Nov 14, 1918 (81 y.o. 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 S41.111A Laceration without foreign body of right upper arm, initial encounter Facility Procedures The patient participates with Medicare or their insurance follows the Medicare Facility Guidelines: CPT4 Code Description Modifier Quantity [id number] [place] VISIT-LEV 3 EST PT 1 Physician Procedures CPT4: Description Modifier Quantity Code [id number] - WC PHYS LEVEL 3 - EST PT 1 ICD-10 Description Diagnosis I70.232 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

## 2015-01-15 NOTE — Progress Notes (Signed)
Philip [name], Philip [name] ([id number]) Visit Report for 01/13/2015 Arrival Information Details Patient Name: Philip [name], Philip [name]. Date of Service: 01/13/2015 10:15 AM Medical Record Number: [id number] Patient Account Number: [id number] Date of Birth/Sex: March 19, 1919 (81 y.o. Male) Treating RN: Montey Hora Primary Care Physician: Hortencia Pilar Other Clinician: Referring Physician: Hortencia Pilar Treating Physician/Extender: Frann Rider in Treatment: 51 Visit Information History Since Last Visit Added or deleted any medications: No Patient Arrived: Cane Any new allergies or adverse reactions: No Arrival Time: 10:19 Had a fall or experienced change in No Accompanied By: son activities of daily living that may affect Transfer Assistance: None risk of falls: Patient Identification Verified: Yes Signs or symptoms of abuse/neglect since last No Secondary Verification Process Yes visito Completed: Hospitalized since last visit: No Patient Requires Transmission- No Pain Present Now: Yes Based Precautions: Patient Has Alerts: Yes Patient Alerts: Patient on Blood Thinner No ABIs dt LE blood clots Electronic Signature(s) Signed: 01/13/2015 5:00:42 PM By: Montey Hora Entered By: Montey Hora on 01/13/2015 10:25:01 Philip [name] ([id number]) -------------------------------------------------------------------------------- Clinic Level of Care Assessment Details Patient Name: Philip [name]. Date of Service: 01/13/2015 10:15 AM Medical Record Number: [id number] Patient Account Number: [id number] Date of Birth/Sex: Apr 14, 1919 (81 y.o. Male) Treating RN: Cornell Barman Primary Care Physician: Hortencia Pilar Other Clinician: Referring Physician: Hortencia Pilar Treating Physician/Extender: Frann Rider in Treatment: 61 Clinic Level of Care Assessment Items TOOL 4 Quantity Score []  - Use when only an EandM is performed on FOLLOW-UP visit 0 ASSESSMENTS - Nursing Assessment /  Reassessment []  - Reassessment of Co-morbidities (includes updates in patient status) 0 X - Reassessment of Adherence to Treatment Plan 1 5 ASSESSMENTS - Wound and Skin Assessment / Reassessment X - Simple Wound Assessment / Reassessment - one wound 1 5 []  - Complex Wound Assessment / Reassessment - multiple wounds 0 []  - Dermatologic / Skin Assessment (not related to wound area) 0 ASSESSMENTS - Focused Assessment []  - Circumferential Edema Measurements - multi extremities 0 []  - Nutritional Assessment / Counseling / Intervention 0 []  - Lower Extremity Assessment (monofilament, tuning fork, pulses) 0 []  - Peripheral Arterial Disease Assessment (using hand held doppler) 0 ASSESSMENTS - Ostomy and/or Continence Assessment and Care []  - Incontinence Assessment and Management 0 []  - Ostomy Care Assessment and Management (repouching, etc.) 0 PROCESS - Coordination of Care X - Simple Patient / Family Education for ongoing care 1 15 []  - Complex (extensive) Patient / Family Education for ongoing care 0 X - Staff obtains Programmer, systems, Records, Test Results / Process Orders 1 10 []  - Staff telephones HHA, Nursing Homes / Clarify orders / etc 0 X - Routine Transfer to another Facility (non-emergent condition) 1 [place] ([id number]) []  - Routine Hospital Admission (non-emergent condition) 0 []  - New Admissions / Biomedical engineer / Ordering NPWT, Apligraf, etc. 0 []  - Emergency Hospital Admission (emergent condition) 0 X - Simple Discharge Coordination 1 10 []  - Complex (extensive) Discharge Coordination 0 PROCESS - Special Needs []  - Pediatric / Minor Patient Management 0 []  - Isolation Patient Management 0 []  - Hearing / Language / Visual special needs 0 []  - Assessment of Community assistance (transportation, D/C planning, etc.) 0 []  - Additional assistance / Altered mentation 0 []  - Support Surface(s) Assessment (bed, cushion, seat, etc.) 0 INTERVENTIONS - Wound Cleansing /  Measurement X - Simple Wound Cleansing - one wound 1 5 []  - Complex Wound Cleansing - multiple wounds 0 X - Wound Imaging (photographs - any number  of wounds) 1 5 []  - Wound Tracing (instead of photographs) 0 X - Simple Wound Measurement - one wound 1 5 []  - Complex Wound Measurement - multiple wounds 0 INTERVENTIONS - Wound Dressings X - Small Wound Dressing one or multiple wounds 1 10 []  - Medium Wound Dressing one or multiple wounds 0 []  - Large Wound Dressing one or multiple wounds 0 []  - Application of Medications - topical 0 []  - Application of Medications - injection 0 INTERVENTIONS - Miscellaneous []  - External ear exam 0 Philip [name], Philip [name] ([id number]) []  - Specimen Collection (cultures, biopsies, blood, body fluids, etc.) 0 []  - Specimen(s) / Culture(s) sent or taken to Lab for analysis 0 []  - Patient Transfer (multiple staff / Harrel Lemon Lift / Similar devices) 0 []  - Simple Staple / Suture removal (25 or less) 0 []  - Complex Staple / Suture removal (26 or more) 0 []  - Hypo / Hyperglycemic Management (close monitor of Blood Glucose) 0 []  - Ankle / Brachial Index (ABI) - do not check if billed separately 0 X - Vital Signs 1 5 Has the patient been seen at the hospital within the last three years: Yes Total Score: 85 Level Of Care: New/Established - Level 3 Electronic Signature(s) Signed: 01/14/2015 5:26:34 PM By: Gretta Cool, RN, BSN, Kim RN, BSN Entered By: Gretta Cool, RN, BSN, Kim on 01/13/2015 17:10:14 Philip [name] ([id number]) -------------------------------------------------------------------------------- Encounter Discharge Information Details Patient Name: Philip [name], Philip [name]. Date of Service: 01/13/2015 10:15 AM Medical Record Number: [id number] Patient Account Number: [id number] Date of Birth/Sex: [birth year] (81 y.o. Male) Treating RN: Cornell Barman Primary Care Physician: Hortencia Pilar Other Clinician: Referring Physician: Hortencia Pilar Treating Physician/Extender: Frann Rider in Treatment: 68 Encounter Discharge Information Items Discharge Pain Level: 0 Discharge Condition: Stable Ambulatory Status: Cane Other (Note Discharge Destination: Required) Transportation: Private Auto Accompanied By: son Schedule Follow-up Appointment: Yes Medication Reconciliation completed No and provided to Patient/Care Provider: Clinical Summary of Care: Notes Pt and son are on their way to the hospital for Doppler DVT study of LLE Electronic Signature(s) Signed: 01/14/2015 5:26:34 PM By: Gretta Cool, RN, BSN, Kim RN, BSN Entered By: Gretta Cool, RN, BSN, Kim on 01/13/2015 10:58:42 Philip [name] ([id number]) -------------------------------------------------------------------------------- Lower Extremity Assessment Details Patient Name: Philip [name], Philip [name]. Date of Service: 01/13/2015 10:15 AM Medical Record Number: [id number] Patient Account Number: [id number] Date of Birth/Sex: April 27, 1918 (81 y.o. Male) Treating RN: Montey Hora Primary Care Physician: Hortencia Pilar Other Clinician: Referring Physician: Hortencia Pilar Treating Physician/Extender: Frann Rider in Treatment: 36 Edema Assessment Assessed: [Left: No] [Right: No] Edema: [Left: Ye] [Right: s] Vascular Assessment Pulses: Posterior Tibial Dorsalis Pedis Palpable: [Left:Yes] Extremity colors, hair growth, and conditions: Extremity Color: [Left:Red] Hair Growth on Extremity: [Left:No] Temperature of Extremity: [Left:Hot] Capillary Refill: [Left:< 3 seconds] Electronic Signature(s) Signed: 01/13/2015 5:00:42 PM By: Montey Hora Entered By: Montey Hora on 01/13/2015 10:30:54 Philip [name] ([id number]) -------------------------------------------------------------------------------- Multi Wound Chart Details Patient Name: Philip [name]. Date of Service: 01/13/2015 10:15 AM Medical Record Number: [id number] Patient Account Number: [id number] Date of Birth/Sex: [birth year] (81 y.o.  Male) Treating RN: Cornell Barman Primary Care Physician: Hortencia Pilar Other Clinician: Referring Physician: Hortencia Pilar Treating Physician/Extender: Frann Rider in Treatment: 80 Vital Signs Height(in): 69 Pulse(bpm): 68 Weight(lbs): 179 Blood Pressure 141/45 (mmHg): Body Mass Index(BMI): 26 Temperature(F): 97.6 Respiratory Rate 18 (breaths/min): Wound Assessments Treatment Notes Electronic Signature(s) Signed: 01/14/2015 5:26:34 PM By: Gretta Cool, RN, BSN, Kim RN, BSN Entered By: Gretta Cool, RN, BSN, Kim on 01/13/2015 10:46:08 Arlan Organ  Wanda Plump ([id number]) -------------------------------------------------------------------------------- [place] Details Patient Name: Philip [name], Philip [name]. Date of Service: 01/13/2015 10:15 AM Medical Record Number: [id number] Patient Account Number: [id number] Date of Birth/Sex: 05-22-1918 (81 y.o. Male) Treating RN: Cornell Barman Primary Care Physician: Hortencia Pilar Other Clinician: Referring Physician: Hortencia Pilar Treating Physician/Extender: Frann Rider in Treatment: 58 Active Inactive Abuse / Safety / Falls / Self Care Management Nursing Diagnoses: Abuse or neglect; actual or potential Potential for falls Goals: Patient will remain injury free Date Initiated: 05/02/2014 Goal Status: Active Interventions: Assess fall risk on admission and as needed Notes: Necrotic Tissue Nursing Diagnoses: Impaired tissue integrity related to necrotic/devitalized tissue Goals: Necrotic/devitalized tissue will be minimized in the wound bed Date Initiated: 05/02/2014 Goal Status: Active Interventions: Assess patient pain level pre-, during and post procedure and prior to discharge Treatment Activities: Apply topical anesthetic as ordered : 01/13/2015 Notes: Orientation to the Wound Care Program Nursing Diagnoses: Knowledge deficit related to the wound healing center program Philip [name], Philip [name]  ([id number]) Goals: Patient/caregiver will verbalize understanding of the [place] Program Date Initiated: 05/02/2014 Goal Status: Active Interventions: Provide education on orientation to the wound center Notes: Electronic Signature(s) Signed: 01/14/2015 5:26:34 PM By: Gretta Cool, RN, BSN, Kim RN, BSN Entered By: Gretta Cool, RN, BSN, Kim on 01/13/2015 10:45:46 Philip [name] ([id number]) -------------------------------------------------------------------------------- Pain Assessment Details Patient Name: Philip [name], Philip [name]. Date of Service: 01/13/2015 10:15 AM Medical Record Number: [id number] Patient Account Number: [id number] Date of Birth/Sex: Jan 05, 1919 (81 y.o. Male) Treating RN: Montey Hora Primary Care Physician: Hortencia Pilar Other Clinician: Referring Physician: Hortencia Pilar Treating Physician/Extender: Frann Rider in Treatment: 51 Active Problems Location of Pain Severity and Description of Pain Patient Has Paino Yes Site Locations Pain Location: Pain in Ulcers With Dressing Change: Yes Duration of the Pain. Constant / Intermittento Constant Character of Pain Describe the Pain: Aching Pain Management and Medication Current Pain Management: Electronic Signature(s) Signed: 01/13/2015 5:00:42 PM By: Montey Hora Entered By: Montey Hora on 01/13/2015 10:25:20 Philip [name] ([id number]) -------------------------------------------------------------------------------- Patient/Caregiver Education Details Patient Name: Philip [name], Philip [name]. Date of Service: 01/13/2015 10:15 AM Medical Record Number: [id number] Patient Account Number: [id number] Date of Birth/Gender: [birth year] (81 y.o. Male) Treating RN: Cornell Barman Primary Care Physician: Hortencia Pilar Other Clinician: Referring Physician: Hortencia Pilar Treating Physician/Extender: Frann Rider in Treatment: 68 Education Assessment Education Provided To: Patient and Caregiver Education Topics  Provided Wound/Skin Impairment: Handouts: Other: how to change bolster if needed Methods: Demonstration, Explain/Verbal Responses: State content correctly Electronic Signature(s) Signed: 01/14/2015 5:26:34 PM By: Gretta Cool, RN, BSN, Kim RN, BSN Entered By: Gretta Cool, RN, BSN, Kim on 01/13/2015 10:59:37 Philip [name] ([id number]) -------------------------------------------------------------------------------- [place] Details Patient Name: Philip [name]. Date of Service: 01/13/2015 10:15 AM Medical Record Number: [id number] Patient Account Number: [id number] Date of Birth/Sex: [birth year] (81 y.o. Male) Treating RN: Montey Hora Primary Care Physician: Hortencia Pilar Other Clinician: Referring Physician: Hortencia Pilar Treating Physician/Extender: Frann Rider in Treatment: 21 Vital Signs Time Taken: 10:25 Temperature (F): 97.6 Height (in): 69 Pulse (bpm): 68 Weight (lbs): 179 Respiratory Rate (breaths/min): 18 Body Mass Index (BMI): 26.4 Blood Pressure (mmHg): 141/45 Reference Range: 80 - 120 mg / dl Electronic Signature(s) Signed: 01/13/2015 5:00:42 PM By: Montey Hora Entered By: Montey Hora on 01/13/2015 10:25:41

## 2015-01-17 ENCOUNTER — Encounter: Payer: Medicare Other | Admitting: Surgery

## 2015-01-17 DIAGNOSIS — L97522 Non-pressure chronic ulcer of other part of left foot with fat layer exposed: Secondary | ICD-10-CM | POA: Diagnosis not present

## 2015-01-17 NOTE — Progress Notes (Signed)
YEIDEN, FRENKEL ([id number]) Visit Report for 01/17/2015 Arrival Information Details Patient Name: JAESON, MOLSTAD. Date of Service: 01/17/2015 10:15 AM Medical Record Number: [id number] Patient Account Number: [id number] Date of Birth/Sex: [birth year] (81 y.o. Male) Treating RN: Baruch Gouty, RN, BSN, Velva Harman Primary Care Physician: Hortencia Pilar Other Clinician: Referring Physician: Hortencia Pilar Treating Physician/Extender: Frann Rider in Treatment: 36 Visit Information History Since Last Visit Any new allergies or adverse reactions: No Patient Arrived: [name] Had a fall or experienced change in No Arrival Time: 10:18 activities of daily living that may affect Accompanied By: dtr risk of falls: Transfer Assistance: None Signs or symptoms of abuse/neglect since last No Patient Identification Verified: Yes visito Patient Requires Transmission- No Has Dressing in Place as Prescribed: Yes Based Precautions: Has Compression in Place as Prescribed: Yes Patient Has Alerts: Yes Pain Present Now: No Patient Alerts: Patient on Blood Thinner No ABIs dt LE blood clots Electronic Signature(s) Signed: 01/17/2015 10:21:20 AM By: Regan Lemming BSN, RN Entered By: Regan Lemming on 01/17/2015 10:21:19 [name] ([id number]) -------------------------------------------------------------------------------- Encounter Discharge Information Details Patient Name: DAYLAN, JUHNKE. Date of Service: 01/17/2015 10:15 AM Medical Record Number: [id number] Patient Account Number: [id number] Date of Birth/Sex: Oct 26, 1918 (81 y.o. Male) Treating RN: Baruch Gouty, RN, BSN, Velva Harman Primary Care Physician: Hortencia Pilar Other Clinician: Referring Physician: Hortencia Pilar Treating Physician/Extender: Frann Rider in Treatment: 63 Encounter Discharge Information Items Discharge Pain Level: 0 Discharge Condition: Stable Ambulatory Status: Cane Discharge Destination: Home Transportation: Private  Auto Accompanied By: dtr Schedule Follow-up Appointment: No Medication Reconciliation completed No and provided to Patient/Care Levaughn Puccinelli: Provided on Clinical Summary of Care: 01/17/2015 Form Type Recipient Paper Patient GT Electronic Signature(s) Signed: 01/17/2015 11:11:44 AM By: Regan Lemming BSN, RN Previous Signature: 01/17/2015 11:06:49 AM Version By: Ruthine Dose Entered By: Regan Lemming on 01/17/2015 11:11:44 [name] ([id number]) -------------------------------------------------------------------------------- Lower Extremity Assessment Details Patient Name: KREGG, CIHLAR. Date of Service: 01/17/2015 10:15 AM Medical Record Number: [id number] Patient Account Number: [id number] Date of Birth/Sex: 12-24-1918 (81 y.o. Male) Treating RN: Baruch Gouty, RN, BSN, Velva Harman Primary Care Physician: Hortencia Pilar Other Clinician: Referring Physician: Hortencia Pilar Treating Physician/Extender: Frann Rider in Treatment: 37 Vascular Assessment Pulses: Posterior Tibial Dorsalis Pedis Palpable: [Left:Yes] Extremity colors, hair growth, and conditions: Extremity Color: [Left:Hyperpigmented] Hair Growth on Extremity: [Left:No] Temperature of Extremity: [Left:Warm] Capillary Refill: [Left:< 3 seconds] Toe Nail Assessment Left: Right: Thick: Yes Discolored: Yes Deformed: No Improper Length and Hygiene: No Electronic Signature(s) Signed: 01/17/2015 10:25:13 AM By: Regan Lemming BSN, RN Entered By: Regan Lemming on 01/17/2015 10:25:13 [name] ([id number]) -------------------------------------------------------------------------------- Multi Wound Chart Details Patient Name: [name]. Date of Service: 01/17/2015 10:15 AM Medical Record Number: [id number] Patient Account Number: [id number] Date of Birth/Sex: May 23, 1918 (81 y.o. 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] [5R:Skin Tear] [N/A:N/A] Comorbid History: [4:Cataracts, Arrhythmia, Congestive Heart Failure] [5R:Cataracts, Arrhythmia, Congestive Heart Failure] [N/A:N/A] Date Acquired: [4:09/20/2014] [5R:09/22/2014] [N/A:N/A] Weeks of Treatment: [4:17] [5R:15] [N/A:N/A] Wound Status: [4:Open] [5R:Open] [N/A:N/A] Wound Recurrence: [4:No] [5R:Yes] [N/A:N/A] Measurements L x W x D 2.5x2.2x0.1 [5R:4x1x0.1] [N/A:N/A] (cm) Area (cm) : [4:4.32] [  5R:3.142] [N/A:N/A] Volume (cm) : [4:0.432] [5R:0.314] [N/A:N/A] % Reduction in Area: [4:-2651.60%] [5R:50.00%] [N/A:N/A] % Reduction in Volume: -2600.00% [5R:50.00%] [N/A:N/A] Classification: [4:Full Thickness With Exposed Support Structures] [5R:Partial Thickness] [N/A:N/A] Exudate Amount: [4:Small] [5R:Medium] [N/A:N/A] Exudate Type: [4:Serosanguineous] [5R:Serosanguineous] [N/A:N/A] Exudate Color: [4:red, brown] [5R:red, brown] [N/A:N/A] Wound Margin: [4:Distinct, outline attached] [5R:Flat and Intact] [N/A:N/A] Granulation Amount: [4:Small (1-33%)] [5R:Large (67-100%)] [N/A:N/A] Granulation Quality: [4:Pink] [5R:N/A] [N/A:N/A] Necrotic Amount: [4:Medium (34-66%)] [5R:None Present (0%)] [N/A:N/A] Exposed Structures: [4:Fascia: Yes Tendon: Yes Fat: No Muscle: No] [5R:Fascia: No Fat: No Tendon: No Muscle: No] [N/A:N/A] Joint: No Joint: No Bone: No Bone: No Limited to Skin Breakdown Epithelialization: Small (1-33%) Small (1-33%) N/A Periwound Skin Texture: Edema: Yes Edema: No N/A Excoriation:  No Excoriation: No Induration: No Induration: No Callus: No Callus: No Crepitus: No Crepitus: No Fluctuance: No Fluctuance: No Friable: No Friable: No Rash: No Rash: No Scarring: No Scarring: No Periwound Skin Moist: Yes Moist: Yes N/A Moisture: Maceration: No Maceration: No Dry/Scaly: No Dry/Scaly: No Periwound Skin Color: Erythema: Yes Atrophie Blanche: No N/A Atrophie Blanche: No Cyanosis: No Cyanosis: No Ecchymosis: No Ecchymosis: No Erythema: No Hemosiderin Staining: No Hemosiderin Staining: No Mottled: No Mottled: No Pallor: No Pallor: No Rubor: No Rubor: No Erythema Location: Circumferential N/A N/A Temperature: No Abnormality No Abnormality N/A Tenderness on Yes No N/A Palpation: Wound Preparation: Ulcer Cleansing: Ulcer Cleansing: N/A Rinsed/Irrigated with Rinsed/Irrigated with Saline Saline Topical Anesthetic Topical Anesthetic Applied: Other: lidocaine Applied: None 4% Treatment Notes Electronic Signature(s) Signed: 01/17/2015 10:33:52 AM By: Regan Lemming BSN, RN Entered By: Regan Lemming on 01/17/2015 10:33:52 [name] ([id number]) -------------------------------------------------------------------------------- [place] Details Patient Name: HARVEST, STANCO. Date of Service: 01/17/2015 10:15 AM Medical Record Number: [id number] Patient Account Number: [id number] Date of Birth/Sex: Aug 16, 1918 (81 y.o. Male) Treating RN: Baruch Gouty, RN, BSN, Velva Harman Primary Care Physician: Hortencia Pilar Other Clinician: Referring Physician: Hortencia Pilar Treating Physician/Extender: Frann Rider in Treatment: 61 Active Inactive Abuse / Safety / Falls / Self Care Management Nursing Diagnoses: Abuse or neglect; actual or potential Potential for falls Goals: Patient will remain injury free Date Initiated: 05/02/2014 Goal Status: Active Interventions: Assess fall risk on admission and as needed Notes: Necrotic Tissue Nursing  Diagnoses: Impaired tissue integrity related to necrotic/devitalized tissue Goals: Necrotic/devitalized tissue will be minimized in the wound bed Date Initiated: 05/02/2014 Goal Status: Active Interventions: Assess patient pain level pre-, during and post procedure and prior to discharge Treatment Activities: Apply topical anesthetic as ordered : 01/17/2015 Notes: Orientation to the Wound Care Program Nursing Diagnoses: Knowledge deficit related to the wound healing center program SIMRAN, MANNIS ([id number]) Goals: Patient/caregiver will verbalize understanding of the [facility] Date Initiated: 05/02/2014 Goal Status: Active Interventions: Provide education on orientation to the wound center Notes: Electronic Signature(s) Signed: 01/17/2015 10:33:01 AM By: Regan Lemming BSN, RN Entered By: Regan Lemming on 01/17/2015 10:33:01 [name] ([id number]) -------------------------------------------------------------------------------- Pain Assessment Details Patient Name: [name]. Date of Service: 01/17/2015 10:15 AM Medical Record Number: [id number] Patient Account Number: [id number] Date of Birth/Sex: Jan 08, 1919 (81 y.o. Male) Treating RN: Baruch Gouty, RN, BSN, Velva Harman Primary Care Physician: Hortencia Pilar Other Clinician: Referring Physician: Hortencia Pilar Treating Physician/Extender: Frann Rider in Treatment: 37 Active Problems Location of Pain Severity and Description of Pain Patient Has Paino No Site Locations Pain Management and Medication Current Pain Management: Electronic Signature(s) Signed: 01/17/2015 10:24:17 AM By: Regan Lemming BSN, RN Entered By: Regan Lemming on 01/17/2015 10:24:16 [name] ([id number]) -------------------------------------------------------------------------------- Patient/Caregiver  Education Details Patient Name: DEBORAH, DONDERO. Date of Service: 01/17/2015 10:15 AM Medical Record Number: [id number] Patient  Account Number: [id number] Date of Birth/Gender: [birth year] (81 y.o. Male) Treating RN: Baruch Gouty, RN, BSN, Velva Harman Primary Care Physician: Hortencia Pilar Other Clinician: Referring Physician: Hortencia Pilar Treating Physician/Extender: Frann Rider in Treatment: 62 Education Assessment Education Provided To: Patient and Caregiver Education Topics Provided Welcome To The [place]: Methods: Explain/Verbal Responses: State content correctly Electronic Signature(s) Signed: 01/17/2015 11:11:54 AM By: Regan Lemming BSN, RN Entered By: Regan Lemming on 01/17/2015 11:11:54 [name] ([id number]) -------------------------------------------------------------------------------- Wound Assessment Details Patient Name: JOSEEDUARDO, BRIX. Date of Service: 01/17/2015 10:15 AM Medical Record Number: [id number] Patient Account Number: [id number] Date of Birth/Sex: 1918/07/07 (81 y.o. Male) Treating RN: Baruch Gouty, RN, BSN, [place] Primary Care Physician: Hortencia Pilar Other Clinician: Referring Physician: Hortencia Pilar Treating Physician/Extender: Frann Rider in Treatment: 37 Wound Status Wound Number: 4 Primary Malignant Wound Etiology: Wound Location: Left Malleolus - Medial Wound Status: Open Wounding Event: Other Lesion Comorbid Cataracts, Arrhythmia, Congestive Date Acquired: 09/20/2014 History: Heart Failure Weeks Of Treatment: 17 Clustered Wound: No Photos Photo Uploaded By: Regan Lemming on 01/17/2015 14:34:58 Wound Measurements Length: (cm) 2.5 Width: (cm) 2.2 Depth: (cm) 0.1 Area: (cm) 4.32 Volume: (cm) 0.432 % Reduction in Area: -2651.6% % Reduction in Volume: -2600% Epithelialization: Small (1-33%) Tunneling: No Undermining: No Wound Description Full Thickness With Exposed Foul Odor Aft Classification: Support Structures Wound Margin: Distinct, outline attached Exudate Small Amount: Exudate Type: Serosanguineous Exudate Color: red, brown er Cleansing:  No Wound Bed Granulation Amount: Small (1-33%) Exposed Structure Granulation Quality: Pink Fascia Exposed: Yes Necrotic Amount: Medium (34-66%) Fat Layer Exposed: No CREED, KAIL ([id number]) Necrotic Quality: Adherent Slough Tendon Exposed: Yes Muscle Exposed: No Joint Exposed: No Bone Exposed: No Periwound Skin Texture Texture Color No Abnormalities Noted: No No Abnormalities Noted: No Callus: No Atrophie Blanche: No Crepitus: No Cyanosis: No Excoriation: No Ecchymosis: No Fluctuance: No Erythema: Yes Friable: No Erythema Location: Circumferential Induration: No Hemosiderin Staining: No Localized Edema: Yes Mottled: No Rash: No Pallor: No Scarring: No Rubor: No Moisture Temperature / Pain No Abnormalities Noted: No Temperature: No Abnormality Dry / Scaly: No Tenderness on Palpation: Yes Maceration: No Moist: Yes Wound Preparation Ulcer Cleansing: Rinsed/Irrigated with Saline Topical Anesthetic Applied: Other: lidocaine 4%, Treatment Notes Wound #4 (Left, Medial Malleolus) 4. Dressing Applied: Other dressing (specify in notes) 5. Secondary Dressing Applied Dry Gauze Gauze and Kerlix/Conform Notes Grafix applied by MD. Lillard [name] with gauze, kerlix and coban Electronic Signature(s) Signed: 01/17/2015 10:32:14 AM By: Regan Lemming BSN, RN Entered By: Regan Lemming on 01/17/2015 10:32:14 [name] ([id number]) -------------------------------------------------------------------------------- Wound Assessment Details Patient Name: KEYVIN, RISON. Date of Service: 01/17/2015 10:15 AM Medical Record Number: [id number] Patient Account Number: [id number] Date of Birth/Sex: 1918-06-13 (81 y.o. Male) Treating RN: Baruch Gouty, RN, BSN, [place] Primary Care Physician: Hortencia Pilar Other Clinician: Referring Physician: Hortencia Pilar Treating Physician/Extender: Frann Rider in Treatment: 37 Wound Status Wound Number: 5R Primary Skin Tear Etiology: Wound  Location: Right Forearm - Lateral Wound Status: Open Wounding Event: Shear/Friction Comorbid Cataracts, Arrhythmia, Congestive Date Acquired: 09/22/2014 History: Heart Failure Weeks Of Treatment: 15 Clustered Wound: No Photos Photo Uploaded By: Regan Lemming on 01/17/2015 14:34:59 Wound Measurements Length: (cm) 4 Width: (cm) 1 Depth: (cm) 0.1 Area: (cm) 3.142 Volume: (cm) 0.314 % Reduction in Area: 50% % Reduction in Volume: 50% Epithelialization: Small (1-33%) Tunneling: No Undermining: No Wound Description Classification: Partial Thickness Foul Odor Aft  Wound Margin: Flat and Intact Exudate Amount: Medium Exudate Type: Serosanguineous Exudate Color: red, brown er Cleansing: No Wound Bed Granulation Amount: Large (67-100%) Exposed Structure Necrotic Amount: None Present (0%) Fascia Exposed: No Fat Layer Exposed: No Tendon Exposed: No SHERYL, TOWELL ([id number]) Muscle Exposed: No Joint Exposed: No Bone Exposed: No Limited to Skin Breakdown Periwound Skin Texture Texture Color No Abnormalities Noted: No No Abnormalities Noted: No Callus: No Atrophie Blanche: No Crepitus: No Cyanosis: No Excoriation: No Ecchymosis: No Fluctuance: No Erythema: No Friable: No Hemosiderin Staining: No Induration: No Mottled: No Localized Edema: No Pallor: No Rash: No Rubor: No Scarring: No Temperature / Pain Moisture Temperature: No Abnormality No Abnormalities Noted: No Dry / Scaly: No Maceration: No Moist: Yes Wound Preparation Ulcer Cleansing: Rinsed/Irrigated with Saline Topical Anesthetic Applied: None Treatment Notes Wound #5R (Right, Lateral Forearm) 4. Dressing Applied: Other dressing (specify in notes) 5. Secondary Dressing Applied Dry Gauze Gauze and Kerlix/Conform Notes Grafix applied by MD. Lillard [name] with gauze, kerlix and coban Electronic Signature(s) Signed: 01/17/2015 10:32:39 AM By: Regan Lemming BSN, RN Entered By: Regan Lemming on 01/17/2015  10:32:39 [name] ([id number]) -------------------------------------------------------------------------------- [place] Details Patient Name: [name]. Date of Service: 01/17/2015 10:15 AM Medical Record Number: [id number] Patient Account Number: [id number] Date of Birth/Sex: October 29, 1918 (81 y.o. Male) Treating RN: Baruch Gouty, RN, BSN, [place] Primary Care Physician: Hortencia Pilar Other Clinician: Referring Physician: Hortencia Pilar Treating Physician/Extender: Frann Rider in Treatment: 37 Vital Signs Time Taken: 10:24 Temperature (F): 97.9 Height (in): 69 Pulse (bpm): 66 Weight (lbs): 179 Respiratory Rate (breaths/min): 18 Body Mass Index (BMI): 26.4 Blood Pressure (mmHg): 128/41 Reference Range: 80 - 120 mg / dl Electronic Signature(s) Signed: 01/17/2015 10:24:49 AM By: Regan Lemming BSN, RN Entered By: Regan Lemming on 01/17/2015 10:24:49

## 2015-01-18 NOTE — Progress Notes (Signed)
BARNELL, SHIEH ([id number]) Visit Report for 01/17/2015 Chief Complaint Document Details Patient Name: Philip [name], Philip [name]. Date of Service: 01/17/2015 10:15 AM Medical Record Number: [id number] Patient Account Number: [id number] Date of Birth/Sex: 1919-02-06 (81 y.o. Male) Treating RN: Baruch Gouty, RN, BSN, Velva Harman Primary Care Physician: Hortencia Pilar Other Clinician: Referring Physician: Hortencia Pilar Treating Physician/Extender: Frann Rider in Treatment: 3 Information Obtained from: Patient Chief Complaint R foot ulcer. L forearm ulcer. 07/19/2014 -- about 2 weeks ago he had a surgical procedure done by dermatologist in [place] and has an open surgical wound on the dorsum of the right foot. Electronic Signature(s) Signed: 01/17/2015 11:09:56 AM By: Christin Fudge MD, FACS Entered By: Christin Fudge on 01/17/2015 11:09:56 Philip [name] ([id number]) -------------------------------------------------------------------------------- Cellular or Tissue Based Product Details Patient Name: Philip [name], Philip [name]. Date of Service: 01/17/2015 10:15 AM Medical Record Number: [id number] Patient Account Number: [id number] Date of Birth/Sex: [birth year] (81 y.o. Male) Treating RN: Baruch Gouty, RN, BSN, Velva Harman Primary Care Physician: Hortencia Pilar Other Clinician: Referring Physician: Hortencia Pilar Treating Physician/Extender: Frann Rider in Treatment: 37 Cellular or Tissue Based Wound #4 Left,Medial Malleolus Product Type Applied to: Performed By: Physician Pat Patrick., MD Cellular or Tissue Based Grafix prime Product Type: Time-Out Taken: Yes Location: genitalia / hands / feet / multiple digits Wound Size (sq cm): 5.5 Product Size (sq cm): 12 Waste Size (sq cm): 0 Amount of Product Applied (sq cm): 12 Lot #: [id number] Expiration Date: 07/31/2015 Fenestrated: No Reconstituted: Yes Solution Type: saline Solution Amount: 6ml Lot #: [id number] Solution Expiration 08/17/2016 Date: Secured:  Yes Secured With: Steri-Strips Dressing Applied: Yes Primary Dressing: mepitel Procedural Pain: 0 Post Procedural Pain: 0 Response to Treatment: Procedure was tolerated well Post Procedure Diagnosis Same as Pre-procedure Electronic Signature(s) Signed: 01/17/2015 11:14:25 AM By: Christin Fudge MD, FACS Previous Signature: 01/17/2015 11:10:31 AM Version By: Regan Lemming BSN, RN Entered By: Christin Fudge on 01/17/2015 11:14:25 Philip [name] ([id number]) -------------------------------------------------------------------------------- HPI Details Patient Name: Philip [name]. Date of Service: 01/17/2015 10:15 AM Medical Record Number: [id number] Patient Account Number: [id number] Date of Birth/Sex: November 20, 1918 (81 y.o. Male) Treating RN: Afful, RN, BSN, [place] Primary Care Physician: Hortencia Pilar Other Clinician: Referring Physician: Hortencia Pilar Treating Physician/Extender: Frann Rider in Treatment: 28 History of Present Illness Location: right leg Duration: Dec 2015 Modifying Factors: history of an injury to the right leg with resulting hematoma and thrombophlebitis and later an ulcer of posterior leg Associated Signs and Symptoms: marked lymphedema of the right leg. He is already on Eloquis. HPI Description: 06/14/14 -- He returns for followup today. He denies any fevers. no fresh issues and his daughter says he's been doing fine. 06/21/14 -- after he sustained a fall this week earlier he applied a bandage over this himself and did not seek any medical attention. he did however manage to control the bleeding and had a dressing in place the next morning when his son to the visit. In this dressing was removed there was further damaged skin. His right leg has been doing fine otherwise. 07/12/14 --Very pleasant 81 year old with past medical history significant for congestive heart failure (EF 15%), peripheral vascular disease, and chronic kidney disease. He was hospitalized at  [facility] in December 2015 for congestive heart failure. He says that he fell during his hospital course and developed a hematoma over his right calf. He was also diagnosed with a right lower extremity DVT for which he takes Eliquis. The hematoma subsequently turned into an ulceration around Christmas, which  has healed. He subsequently developed an ulcer on his right dorsal foot and a traumatic left forearm ulcer. Per his report, he underwent biopsy of the right dorsal foot ulceration which demonstrated a skin cancer. His PCP and dermatologist office are both closed today. I reviewed his records in [place] but find no report of biopsy or pathology. He s without complaints today. No significant pain. No fever or chills. Minimal drainage. 07/19/2014 - the patient and his son tell me that about 2 weeks ago the dermatologist did a skin biopsy and this was a large area on the dorsum of his right foot which was left open and no dressing instructions were recommended. Since then he has been called and told that it is a cancer and the need to do a further procedure but that will not happen until about 2 weeks from now. In the meanwhile the patient has not been taking care of his right foot. The left forearm where he had an abrasion and laceration is doing very well. 07/26/2014 -- Reports from 07/08/2014 from the dermatology group reviewed. A excision was done of a lesion located on the dorsum of the right foot and this was 1.7 cm in diameter which was a shave biopsy performed. The wound was left open after appropriate cauterization and the patient was given this dressing instructions. 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 08/02/2014 -- all the original problems he came with have completely resolved. He now has a surgical wound on his right foot dorsum where a skin cancer was excised. He goes to see his dermatologist  this Philip [name], Philip [name] ([id number]) coming Tuesday and will have definite news next Friday. 08/09/2014 he had gone to his dermatologist on Tuesday and she has injected the base of his ulcer with some chemotherapeutic agent. He was supposed to bring some papers with him but forgot to get them and will bring them in next week. Other than that the dermatologist had suggested using Mehdi honey on the wound. 08/16/2014 -- the patient has brought in his notes from the dermatologist and on 08/06/2014 he received a injection of 5 FU, 500 mg grams into the lesion. the pathology report was also sent and it was a squamous cell carcinoma well-differentiated and edges were involved. They wanted him to use many honey for the wound dressing changes to be done 3 times a week. 08/30/2014 -- he has finished his second injection of 5-FU and has the next one in 2 weeks' time. He is doing fine otherwise. 09/13/2014 - No new complaints. No significant pain. No fever or chills. Minimal drainage. Still receiving 5-FU injections. 09/20/2014 -- He was seen by the dermatologist on 09/10/2014 and Dr. Phillip Heal injected his foot both the right on the dorsum and left near the medial malleolus with 5-FU. The next dose of 5-FU is to be given after 3 months. The patient says he now has a spot on the left medial malleolus where he was injected with 5-FU. 09/27/2014 -- the area on the left ankle where he was injected with 5-FU is now a full-blown ulcer. He also has mild pain in both ankle areas. 10/04/2014 - large had a bit of a fall and injured his right arm last evening and has had a laceration with no evidence of any foreign body in the right forearm. 12/20/2014 -- he recently saw his dermatologist at [facility] and she was pleased with his wound healing  on the right lower extremity. She has not given him any further 5-FU injections and will see him back on a when necessary basis. 01/03/2015 -- his skin substitute Grafix has been  approved by his insurance and we will go ahead with this next week. 01/10/2015 -- he has his first application of Grafix today. he recently had a nightmare and injured his right forearm and had some abrasions. 01/13/2015 -- Iona Beard has developed significant swelling of the left calf with some tenderness of the calf. This was only noticed this morning. Addendum: The DVT study done in the hospital -- IMPRESSION:No evidence of deep venous thrombosis left lower extremity. The result was called into the patient's son who acknowledges the report and will bring the patient back on Friday 01/17/2015 -- he saw his PCP Dr. Hoy Morn and she has increases dosage of Lasix and his edema on the left lower extremity is looking much better. He is here for a second Risk analyst) Signed: 01/17/2015 11:10:46 AM By: Christin Fudge MD, FACS Entered By: Christin Fudge on 01/17/2015 11:10:45 Philip [name] ([id number]) -------------------------------------------------------------------------------- Physical Exam Details Patient Name: Philip [name], Philip [name]. Date of Service: 01/17/2015 10:15 AM Medical Record Number: [id number] Patient Account Number: [id number] Date of Birth/Sex: June 30, 1918 (81 y.o. Male) Treating RN: Baruch Gouty, RN, BSN, Velva Harman Primary Care Physician: Hortencia Pilar Other Clinician: Referring Physician: Hortencia Pilar Treating Physician/Extender: Frann Rider in Treatment: 37 Constitutional . Pulse regular. Respirations normal and unlabored. Afebrile. . Eyes Nonicteric. Reactive to light. Ears, Nose, Mouth, and Throat Lips, teeth, and gums WNL.Marland Kitchen Moist mucosa without lesions . Neck supple and nontender. No palpable supraclavicular or cervical adenopathy. Normal sized without goiter. Respiratory WNL. No retractions.. Breath sounds WNL, No rubs, rales, rhonchi, or wheeze.. Cardiovascular Heart rhythm and rate regular, no murmur or gallop.. Pedal Pulses WNL. No  clubbing, cyanosis or edema. Chest Breasts symmetical and no nipple discharge.. Breast tissue WNL, no masses, lumps, or tenderness.. Lymphatic No adneopathy. No adenopathy. No adenopathy. Musculoskeletal Adexa without tenderness or enlargement.. Digits and nails w/o clubbing, cyanosis, infection, petechiae, ischemia, or inflammatory conditions.. Integumentary (Hair, Skin) No suspicious lesions. No crepitus or fluctuance. No peri-wound warmth or erythema. No masses.Marland Kitchen Psychiatric Judgement and insight Intact.. No evidence of depression, anxiety, or agitation.. Notes The edema of the left lower extremity is looking much better and there is no evidence of cellulitis. The wound itself has some remenants of the grafix and this has been washed out with saline and gently debrided. Electronic Signature(s) Signed: 01/17/2015 11:12:23 AM By: Christin Fudge MD, FACS Entered By: Christin Fudge on 01/17/2015 11:12:22 Philip [name] ([id number]) -------------------------------------------------------------------------------- Physician Orders Details Patient Name: SIRAJ, DERMODY. Date of Service: 01/17/2015 10:15 AM Medical Record Number: [id number] Patient Account Number: [id number] Date of Birth/Sex: 1918/12/04 (81 y.o. Male) Treating RN: Baruch Gouty, RN, BSN, Velva Harman Primary Care Physician: Hortencia Pilar Other Clinician: Referring Physician: Hortencia Pilar Treating Physician/Extender: Frann Rider in Treatment: 46 Verbal / Phone Orders: Yes Clinician: Afful, RN, BSN, Rita Read Back and Verified: Yes Diagnosis Coding Wound Cleansing Wound #4 Left,Medial Malleolus o Clean wound with Normal Saline. Wound #5R Right,Lateral Forearm o Clean wound with Normal Saline. Anesthetic Wound #4 Left,Medial Malleolus o Topical Lidocaine 4% cream applied to wound bed prior to debridement Wound #5R Right,Lateral Forearm o Topical Lidocaine 4% cream applied to wound bed prior to debridement Primary  Wound Dressing Wound #5R Right,Lateral Forearm o Drawtex Secondary Dressing Wound #5R Right,Lateral Forearm o Gauze and Kerlix/Conform - lightly secured  with [place]an Dressing Change Frequency Wound #4 Left,Medial Malleolus o Change dressing every week Wound #5R Right,Lateral Forearm o Change dressing every week Follow-up Appointments Wound #4 Left,Medial Malleolus o Return Appointment in 1 week. Wound #5R Right,Lateral Forearm o Return Appointment in 1 week. Philip [name], Philip [name] ([id number]) Home Health Wound #4 [place] Nurse may visit PRN to address patientos wound care needs. - Dressings do not need to be changed this week due to Grafix being applied to ankle. o FACE TO FACE ENCOUNTER: MEDICARE and MEDICAID PATIENTS: I certify that this patient is under my care and that I had a face-to-face encounter that meets the physician face-to-face encounter requirements with this patient on this date. The encounter with the patient was in whole or in part for the following MEDICAL CONDITION: (primary reason for [place]) MEDICAL NECESSITY: I certify, that based on my findings, NURSING services are a medically necessary home health service. HOME BOUND STATUS: I certify that my clinical findings support that this patient is homebound (i.e., Due to illness or injury, pt requires aid of supportive devices such as crutches, cane, wheelchairs, walkers, the use of special transportation or the assistance of another person to leave their place of residence. There is a normal inability to leave the home and doing so requires considerable and taxing effort. Other absences are for medical reasons / religious services and are infrequent or of short duration when for other reasons). o If current dressing causes regression in wound condition, may D/C ordered dressing product/s and apply Normal Saline Moist Dressing daily until next [place] /  Other MD appointment. [place] of regression in wound condition at [phone]. o Please direct any NON-WOUND related issues/requests for orders to patient's Primary Care Physician Wound #5R Right,Lateral Forearm o Home Health Nurse may visit PRN to address patientos wound care needs. - Dressings do not need to be changed this week due to Grafix being applied to ankle. o FACE TO FACE ENCOUNTER: MEDICARE and MEDICAID PATIENTS: I certify that this patient is under my care and that I had a face-to-face encounter that meets the physician face-to-face encounter requirements with this patient on this date. The encounter with the patient was in whole or in part for the following MEDICAL CONDITION: (primary reason for [place]) MEDICAL NECESSITY: I certify, that based on my findings, NURSING services are a medically necessary home health service. HOME BOUND STATUS: I certify that my clinical findings support that this patient is homebound (i.e., Due to illness or injury, pt requires aid of supportive devices such as crutches, cane, wheelchairs, walkers, the use of special transportation or the assistance of another person to leave their place of residence. There is a normal inability to leave the home and doing so requires considerable and taxing effort. Other absences are for medical reasons / religious services and are infrequent or of short duration when for other reasons). o If current dressing causes regression in wound condition, may D/C ordered dressing product/s and apply Normal Saline Moist Dressing daily until next Friend / Other MD appointment. [place] of regression in wound condition at [phone]. o Please direct any NON-WOUND related issues/requests for orders to patient's Primary Care Physician [facility] Wound #4 Left,Medial Malleolus o Grafix Core application in clinic; including contact layer,  fixation with steri strips, dry gauze and cover dressing. - [address] EJAY, LASHLEY ([id number]) Electronic Signature(s) Signed: 01/17/2015 11:06:04 AM By: Regan Lemming BSN, RN  Signed: 01/17/2015 4:17:16 PM By: Christin Fudge MD, FACS Entered By: Regan Lemming on 01/17/2015 11:06:03 Philip [name] ([id number]) -------------------------------------------------------------------------------- Problem List Details Patient Name: VENCE, LALOR. Date of Service: 01/17/2015 10:15 AM Medical Record Number: [id number] Patient Account Number: [id number] Date of Birth/Sex: Aug 29, 1918 (81 y.o. Male) Treating RN: Baruch Gouty, RN, BSN, Velva Harman Primary Care Physician: Hortencia Pilar Other Clinician: Referring Physician: Hortencia Pilar Treating Physician/Extender: Frann Rider in Treatment: 37 Active Problems ICD-10 Encounter Code Description Active Date Diagnosis I70.232 Atherosclerosis of native arteries of right leg with 06/21/2014 Yes ulceration of calf I82.401 Acute embolism and thrombosis of unspecified deep veins 06/21/2014 Yes of right lower extremity S91.301A Unspecified open wound, right foot, initial encounter 07/19/2014 Yes Z92.21 Personal history of antineoplastic chemotherapy 08/23/2014 Yes L97.522 Non-pressure chronic ulcer of other part of left foot with fat 09/20/2014 Yes layer exposed S41.111A Laceration without foreign body of right upper arm, initial 10/04/2014 Yes encounter Inactive Problems Resolved Problems ICD-10 Code Description Active Date Resolved Date L97.212 Non-pressure chronic ulcer of right calf with fat layer 06/21/2014 06/21/2014 exposed S51.812A Laceration without foreign body of left forearm, initial 06/21/2014 06/21/2014 encounter DAKING, [place] ([id number]) Electronic Signature(s) Signed: 01/17/2015 11:09:41 AM By: Christin Fudge MD, FACS Entered By: Christin Fudge on 01/17/2015 11:09:41 Philip [name]  ([id number]) -------------------------------------------------------------------------------- Progress Note Details Patient Name: Philip [name]. Date of Service: 01/17/2015 10:15 AM Medical Record Number: [id number] Patient Account Number: [id number] Date of Birth/Sex: 05-08-1918 (81 y.o. Male) Treating RN: Baruch Gouty, RN, BSN, Velva Harman Primary Care Physician: Hortencia Pilar Other Clinician: Referring Physician: Hortencia Pilar Treating Physician/Extender: Frann Rider in Treatment: 37 Subjective Chief Complaint Information obtained from Patient R foot ulcer. L forearm ulcer. 07/19/2014 -- about 2 weeks ago he had a surgical procedure done by dermatologist in [place] and has an open surgical wound on the dorsum of the right foot. History of Present Illness (HPI) The following HPI elements were documented for the patient's wound: Location: right leg Duration: Dec 2015 Modifying Factors: history of an injury to the right leg with resulting hematoma and thrombophlebitis and later an ulcer of posterior leg Associated Signs and Symptoms: marked lymphedema of the right leg. He is already on Eloquis. 06/14/14 -- He returns for followup today. He denies any fevers. no fresh issues and his daughter says he's been doing fine. 06/21/14 -- after he sustained a fall this week earlier he applied a bandage over this himself and did not seek any medical attention. he did however manage to control the bleeding and had a dressing in place the next morning when his son to the visit. In this dressing was removed there was further damaged skin. His right leg has been doing fine otherwise. 07/12/14 --Very pleasant 81 year old with past medical history significant for congestive heart failure (EF 15%), peripheral vascular disease, and chronic kidney disease. He was hospitalized at [facility] in December 2015 for congestive heart failure. He says that he fell during his hospital course and developed a hematoma over his  right calf. He was also diagnosed with a right lower extremity DVT for which he takes Eliquis. The hematoma subsequently turned into an ulceration around Christmas, which has healed. He subsequently developed an ulcer on his right dorsal foot and a traumatic left forearm ulcer. Per his report, he underwent biopsy of the right dorsal foot ulceration which demonstrated a skin cancer. His PCP and dermatologist office are both closed today. I reviewed his records in [place] but find no report of biopsy or pathology.  He s without complaints today. No significant pain. No fever or chills. Minimal drainage. 07/19/2014 - the patient and his son tell me that about 2 weeks ago the dermatologist did a skin biopsy and this was a large area on the dorsum of his right foot which was left open and no dressing instructions were recommended. Since then he has been called and told that it is a cancer and the need to do a further procedure but that will not happen until about 2 weeks from now. In the meanwhile the patient has not been taking care of his right foot. The left forearm where he had an abrasion and laceration is doing very well. Philip [name], Philip [name] ([id number]) 07/26/2014 -- Reports from 07/08/2014 from the dermatology group reviewed. A excision was done of a lesion located on the dorsum of the right foot and this was 1.7 cm in diameter which was a shave biopsy performed. The wound was left open after appropriate cauterization and the patient was given this dressing instructions. 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 08/02/2014 -- all the original problems he came with have completely resolved. He now has a surgical wound on his right foot dorsum where a skin cancer was excised. He goes to see his dermatologist this coming Tuesday and will have definite news next Friday. 08/09/2014 he had gone to his dermatologist  on Tuesday and she has injected the base of his ulcer with some chemotherapeutic agent. He was supposed to bring some papers with him but forgot to get them and will bring them in next week. Other than that the dermatologist had suggested using Mehdi honey on the wound. 08/16/2014 -- the patient has brought in his notes from the dermatologist and on 08/06/2014 he received a injection of 5 FU, 500 mg grams into the lesion. the pathology report was also sent and it was a squamous cell carcinoma well-differentiated and edges were involved. They wanted him to use many honey for the wound dressing changes to be done 3 times a week. 08/30/2014 -- he has finished his second injection of 5-FU and has the next one in 2 weeks' time. He is doing fine otherwise. 09/13/2014 - No new complaints. No significant pain. No fever or chills. Minimal drainage. Still receiving 5-FU injections. 09/20/2014 -- He was seen by the dermatologist on 09/10/2014 and Dr. Phillip Heal injected his foot both the right on the dorsum and left near the medial malleolus with 5-FU. The next dose of 5-FU is to be given after 3 months. The patient says he now has a spot on the left medial malleolus where he was injected with 5-FU. 09/27/2014 -- the area on the left ankle where he was injected with 5-FU is now a full-blown ulcer. He also has mild pain in both ankle areas. 10/04/2014 - large had a bit of a fall and injured his right arm last evening and has had a laceration with no evidence of any foreign body in the right forearm. 12/20/2014 -- he recently saw his dermatologist at [facility] and she was pleased with his wound healing on the right lower extremity. She has not given him any further 5-FU injections and will see him back on a when necessary basis. 01/03/2015 -- his skin substitute Grafix has been approved by his insurance and we will go ahead with this next week. 01/10/2015 -- he has his first application of Grafix today. he  recently  had a nightmare and injured his right forearm and had some abrasions. 01/13/2015 -- Iona Beard has developed significant swelling of the left calf with some tenderness of the calf. This was only noticed this morning. Addendum: The DVT study done in the hospital -- IMPRESSION:No evidence of deep venous thrombosis left lower extremity. The result was called into the patient's son who acknowledges the report and will bring the patient back on Friday Philip [name], Philip [name] ([id number]) 01/17/2015 -- he saw his PCP Dr. Hoy Morn and she has increases dosage of Lasix and his edema on the left lower extremity is looking much better. He is here for a second applications of Grafix Objective Constitutional Pulse regular. Respirations normal and unlabored. Afebrile. Vitals Time Taken: 10:24 AM, Height: 69 in, Weight: 179 lbs, BMI: 26.4, Temperature: 97.9 F, Pulse: 66 bpm, Respiratory Rate: 18 breaths/min, Blood Pressure: 128/41 mmHg. Eyes Nonicteric. Reactive to light. Ears, Nose, Mouth, and Throat Lips, teeth, and gums WNL.Marland Kitchen Moist mucosa without lesions . Neck supple and nontender. No palpable supraclavicular or cervical adenopathy. Normal sized without goiter. Respiratory WNL. No retractions.. Breath sounds WNL, No rubs, rales, rhonchi, or wheeze.. Cardiovascular Heart rhythm and rate regular, no murmur or gallop.. Pedal Pulses WNL. No clubbing, cyanosis or edema. Chest Breasts symmetical and no nipple discharge.. Breast tissue WNL, no masses, lumps, or tenderness.. Lymphatic No adneopathy. No adenopathy. No adenopathy. Musculoskeletal Adexa without tenderness or enlargement.. Digits and nails w/o clubbing, cyanosis, infection, petechiae, ischemia, or inflammatory conditions.Marland Kitchen Psychiatric Judgement and insight Intact.. No evidence of depression, anxiety, or agitation.. General Notes: The edema of the left lower extremity is looking much better and there is no evidence of cellulitis. The wound  itself has some remenants of the grafix and this has been washed out with saline and gently debrided. Philip [name], Philip [name] ([id number]) Integumentary (Hair, Skin) No suspicious lesions. No crepitus or fluctuance. No peri-wound warmth or erythema. No masses.. Wound #4 status is Open. Original cause of wound was Other Lesion. The wound is located on the Left,Medial Malleolus. The wound measures 2.5cm length x 2.2cm width x 0.1cm depth; 4.32cm^2 area and 0.432cm^3 volume. There is tendon and fascia exposed. There is no tunneling or undermining noted. There is a small amount of serosanguineous drainage noted. The wound margin is distinct with the outline attached to the wound base. There is small (1-33%) pink granulation within the wound bed. There is a medium (34-66%) amount of necrotic tissue within the wound bed including Adherent Slough. The periwound skin appearance exhibited: Localized Edema, Moist, Erythema. The periwound skin appearance did not exhibit: Callus, Crepitus, Excoriation, Fluctuance, Friable, Induration, Rash, Scarring, Dry/Scaly, Maceration, Atrophie Blanche, Cyanosis, Ecchymosis, Hemosiderin Staining, Mottled, Pallor, Rubor. The surrounding wound skin color is noted with erythema which is circumferential. Periwound temperature was noted as No Abnormality. The periwound has tenderness on palpation. Wound #5R status is Open. Original cause of wound was Shear/Friction. The wound is located on the Right,Lateral Forearm. The wound measures 4cm length x 1cm width x 0.1cm depth; 3.142cm^2 area and 0.314cm^3 volume. The wound is limited to skin breakdown. There is no tunneling or undermining noted. There is a medium amount of serosanguineous drainage noted. The wound margin is flat and intact. There is large (67-100%) granulation within the wound bed. There is no necrotic tissue within the wound bed. The periwound skin appearance exhibited: Moist. The periwound skin appearance did not  exhibit: Callus, Crepitus, Excoriation, Fluctuance, Friable, Induration, Localized Edema, Rash, Scarring, Dry/Scaly, Maceration, Atrophie Blanche, Cyanosis, Ecchymosis, Hemosiderin Staining,  Mottled, Pallor, Rubor, Erythema. Periwound temperature was noted as No Abnormality. 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 This has been bolstered in place and a light dressing has been applied. I have asked him to continue elevation of the limb and also to take his Lasix as prescribed by his PCP. Philip [name], Philip [name] ([id number]) We will see him back next week. 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 A skin graft procedure using a bioengineered skin substitute/cellular or tissue based product was performed by Britto, Jackson Latino., MD. Grafix prime was applied and secured with Steri-Strips. 12 sq cm of product was utilized and 0 sq cm was wasted. Post Application, mepitel was applied. A Time Out was conducted prior to the start of the procedure. The procedure was tolerated well with a pain level of 0 throughout and a pain level of 0 following the procedure. Post procedure Diagnosis Wound #4: Same as Pre-Procedure . Plan Wound Cleansing: Wound #4 Left,Medial Malleolus: Clean wound with Normal Saline. Wound #5R Right,Lateral Forearm: Clean wound with Normal Saline. Anesthetic: Wound #4 Left,Medial Malleolus: Topical Lidocaine 4% cream  applied to wound bed prior to debridement Wound #5R Right,Lateral Forearm: Topical Lidocaine 4% cream applied to wound bed prior to debridement Primary Wound Dressing: Wound #5R Right,Lateral Forearm: Drawtex Secondary Dressing: Wound #5R Right,Lateral Forearm: Gauze and Kerlix/Conform - lightly secured with [place]an Dressing Change Frequency: Wound #4 Left,Medial Malleolus: Change dressing every week Wound #5R Right,Lateral Forearm: Change dressing every week Follow-up Appointments: Wound #4 Left,Medial Malleolus: Return Appointment in 1 week. Wound #5R Right,Lateral Forearm: Return Appointment in 1 week. Home Health: Wound #4 Left,Medial Malleolus: Home Health Nurse may visit PRN to address patient s wound care needs. - Dressings do not need to be changed this week due to Grafix being applied to ankle. Philip [name], Philip [name] ([id number]) FACE TO FACE ENCOUNTER: MEDICARE and MEDICAID PATIENTS: I certify that this patient is under my care and that I had a face-to-face encounter that meets the physician face-to-face encounter requirements with this patient on this date. The encounter with the patient was in whole or in part for the following MEDICAL CONDITION: (primary reason for [place]) MEDICAL NECESSITY: I certify, that based on my findings, NURSING services are a medically necessary home health service. HOME BOUND STATUS: I certify that my clinical findings support that this patient is homebound (i.e., Due to illness or injury, pt requires aid of supportive devices such as crutches, cane, wheelchairs, walkers, the use of special transportation or the assistance of another person to leave their place of residence. There is a normal inability to leave the home and doing so requires considerable and taxing effort. Other absences are for medical reasons / religious services and are infrequent or of short duration when for other reasons). If current dressing causes regression in wound  condition, may D/C ordered dressing product/s and apply Normal Saline Moist Dressing daily until next [place] / Other MD appointment. [place] of regression in wound condition  at [phone]. Please direct any NON-WOUND related issues/requests for orders to patient's Primary Care Physician Wound #5R Right,Lateral Forearm: Home Health Nurse may visit PRN to address patient s wound care needs. - Dressings do not need to be changed this week due to Grafix being applied to ankle. FACE TO FACE ENCOUNTER: MEDICARE and MEDICAID PATIENTS: I certify that this patient is under my care and that I had a face-to-face encounter that meets the physician face-to-face encounter requirements with this patient on this date. The encounter with the patient was in whole or in part for the following MEDICAL CONDITION: (primary reason for [place]) MEDICAL NECESSITY: I certify, that based on my findings, NURSING services are a medically necessary home health service. HOME BOUND STATUS: I certify that my clinical findings support that this patient is homebound (i.e., Due to illness or injury, pt requires aid of supportive devices such as crutches, cane, wheelchairs, walkers, the use of special transportation or the assistance of another person to leave their place of residence. There is a normal inability to leave the home and doing so requires considerable and taxing effort. Other absences are for medical reasons / religious services and are infrequent or of short duration when for other reasons). If current dressing causes regression in wound condition, may D/C ordered dressing product/s and apply Normal Saline Moist Dressing daily until next [place] / Other MD appointment. [place] of regression in wound condition at [phone]. Please direct any NON-WOUND related issues/requests for orders to patient's Primary Care Physician Advanced  Therapies: Wound #4 Left,Medial Malleolus: Grafix Core application in clinic; including contact layer, fixation with steri strips, dry gauze and cover dressing. - 3x4 With the usual precautions the second application of Grafix has been done. This has been bolstered in place and a light dressing has been applied. I have asked him to continue elevation of the limb and also to take his Lasix as prescribed by his PCP. We will see him back next week. Philip [name], Philip [name] ([id number]) Electronic Signature(s) Signed: 01/17/2015 11:14:45 AM By: Christin Fudge MD, FACS Previous Signature: 01/17/2015 11:13:51 AM Version By: Christin Fudge MD, FACS Entered By: Christin Fudge on 01/17/2015 11:14:45 Philip [name] ([id number]) -------------------------------------------------------------------------------- SuperBill Details Patient Name: Philip [name], CANINO. Date of Service: 01/17/2015 Medical Record Number: [id number] Patient Account Number: [id number] Date of Birth/Sex: 19-Oct-1918 (81 y.o. 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 S41.111A Laceration without foreign body of right upper arm, initial encounter Facility Procedures The patient participates with Medicare or their insurance follows the Medicare Facility Guidelines: CPT4 Description Modifier Quantity Code [id number] (Facility Use Only) Grafix Prime 1 SQ CM 12 The patient participates  with Medicare or their insurance follows the Medicare Facility Guidelines: [id number] - SKIN SUB GRAFT FACE/NK/HF/G 1 ICD-10 Description Diagnosis I70.232 Atherosclerosis of native arteries of right leg with ulceration of  calf I82.401 Acute embolism and thrombosis of unspecified deep veins of right lower extremity S91.301A Unspecified open wound, right foot, initial encounter L97.522 Non-pressure chronic ulcer of other part of  left foot with fat layer exposed Physician Procedures CPT4: Description Modifier Quantity Code [id number] - WC PHYS SKIN SUB GRAFT FACE/NK/HF/G 1 ICD-10 Description Diagnosis I70.232 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 I82.401 Acute embolism and thrombosis of  unspecified deep veins of right lower extremity S91.301A Unspecified open wound, right foot, initial encounter L97.522 Non-pressure chronic ulcer of other part of left foot with fat layer exposed SAMSON, RALPH ([id number]) Electronic Signature(s) Signed: 01/17/2015 11:15:05 AM By: Christin Fudge MD, FACS Entered By: Christin Fudge on 01/17/2015 11:15:05

## 2015-01-24 ENCOUNTER — Encounter: Payer: Medicare Other | Admitting: Surgery

## 2015-01-28 ENCOUNTER — Encounter: Payer: Medicare Other | Attending: Surgery | Admitting: Surgery

## 2015-01-28 DIAGNOSIS — I82401 Acute embolism and thrombosis of unspecified deep veins of right lower extremity: Secondary | ICD-10-CM | POA: Insufficient documentation

## 2015-01-28 DIAGNOSIS — S41111D Laceration without foreign body of right upper arm, subsequent encounter: Secondary | ICD-10-CM | POA: Diagnosis not present

## 2015-01-28 DIAGNOSIS — X58XXXD Exposure to other specified factors, subsequent encounter: Secondary | ICD-10-CM | POA: Insufficient documentation

## 2015-01-28 DIAGNOSIS — S91301D Unspecified open wound, right foot, subsequent encounter: Secondary | ICD-10-CM | POA: Insufficient documentation

## 2015-01-28 DIAGNOSIS — Z9221 Personal history of antineoplastic chemotherapy: Secondary | ICD-10-CM | POA: Insufficient documentation

## 2015-01-28 DIAGNOSIS — L97522 Non-pressure chronic ulcer of other part of left foot with fat layer exposed: Secondary | ICD-10-CM | POA: Insufficient documentation

## 2015-01-28 DIAGNOSIS — I70232 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: Secondary | ICD-10-CM | POA: Diagnosis not present

## 2015-01-29 NOTE — Progress Notes (Signed)
Philip, [name] ([id number]) Visit Report for 01/28/2015 Arrival Information Details Patient Name: Philip [name], Philip [name]. Date of Service: 01/28/2015 9:30 AM Medical Record Number: [id number] Patient Account Number: [id number] Date of Birth/Sex: 09/03/1918 (81 y.o. Male) Treating RN: Montey Hora Primary Care Physician: Hortencia Pilar Other Clinician: Referring Physician: Hortencia Pilar Treating Physician/Extender: Frann Rider in Treatment: 90 Visit Information History Since Last Visit Added or deleted any medications: No Patient Arrived: [place] Any new allergies or adverse reactions: No Arrival Time: 09:45 Had a fall or experienced change in No Accompanied By: son activities of daily living that may affect Transfer Assistance: None risk of falls: Patient Identification Verified: Yes Signs or symptoms of abuse/neglect since last No Secondary Verification Process Yes visito Completed: Hospitalized since last visit: No Patient Requires Transmission- No Pain Present Now: No Based Precautions: Patient Has Alerts: Yes Patient Alerts: Patient on Blood Thinner No ABIs dt LE blood clots Electronic Signature(s) Signed: 01/28/2015 5:00:37 PM By: Montey Hora Entered By: Montey Hora on 01/28/2015 09:45:32 Philip [name] ([id number]) -------------------------------------------------------------------------------- Encounter Discharge Information Details Patient Name: Philip [name], Philip [name]. Date of Service: 01/28/2015 9:30 AM Medical Record Number: [id number] Patient Account Number: [id number] Date of Birth/Sex: [birth year] (81 y.o. Male) Treating RN: Montey Hora Primary Care Physician: Hortencia Pilar Other Clinician: Referring Physician: Hortencia Pilar Treating Physician/Extender: Frann Rider in Treatment: 19 Encounter Discharge Information Items Discharge Pain Level: 0 Discharge Condition: Stable Ambulatory Status: Cane Discharge Destination:  Home Transportation: Private Auto Accompanied By: son Schedule Follow-up Appointment: Yes Medication Reconciliation completed and provided to Patient/Care No Philip [name]: Provided on Clinical Summary of Care: 01/28/2015 Form Type Recipient Paper Patient GT Electronic Signature(s) Signed: 01/28/2015 5:00:37 PM By: Montey Hora Previous Signature: 01/28/2015 10:47:59 AM Version By: Ruthine Dose Entered By: Montey Hora on 01/28/2015 11:28:01 Philip [name] ([id number]) -------------------------------------------------------------------------------- Lower Extremity Assessment Details Patient Name: Philip [name], Philip [name]. Date of Service: 01/28/2015 9:30 AM Medical Record Number: [id number] Patient Account Number: [id number] Date of Birth/Sex: 1918/08/07 (81 y.o. Male) Treating RN: Montey Hora Primary Care Physician: Hortencia Pilar Other Clinician: Referring Physician: Hortencia Pilar Treating Physician/Extender: Frann Rider in Treatment: 26 Edema Assessment Assessed: [Left: No] [Right: No] Edema: [Left: Ye] [Right: s] Vascular Assessment Pulses: Posterior Tibial Dorsalis Pedis Palpable: [Left:Yes] Extremity colors, hair growth, and conditions: Extremity Color: [Left:Hyperpigmented] Hair Growth on Extremity: [Left:No] Temperature of Extremity: [Left:Warm] Capillary Refill: [Left:< 3 seconds] Toe Nail Assessment Left: Right: Thick: Yes Discolored: Yes Deformed: No Improper Length and Hygiene: No Electronic Signature(s) Signed: 01/28/2015 5:00:37 PM By: Montey Hora Entered By: Montey Hora on 01/28/2015 09:57:41 Philip [name] ([id number]) -------------------------------------------------------------------------------- Multi Wound Chart Details Patient Name: Philip [name]. Date of Service: 01/28/2015 9:30 AM Medical Record Number: [id number] Patient Account Number: [id number] Date of Birth/Sex: 1918/05/13 (81 y.o. 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] [5R:Skin Tear] [N/A:N/A] Comorbid History: [4:Cataracts, Arrhythmia, Congestive Heart Failure] [5R:Cataracts, Arrhythmia, Congestive Heart Failure] [N/A:N/A] Date Acquired: [4:09/20/2014] [5R:09/22/2014] [N/A:N/A] Weeks of Treatment: [4:18] [5R:16] [N/A:N/A] Wound Status: [4:Open] [5R:Open] [N/A:N/A] Wound Recurrence: [4:No] [5R:Yes] [N/A:N/A] Measurements L x W x D 2.8x2.6x0.2 [5R:4.2x2x0.1] [N/A:N/A] (cm) Area (cm) : [4:5.718] [  5R:6.597] [N/A:N/A] Volume (cm) : [4:1.144] [5R:0.66] [N/A:N/A] % Reduction in Area: [4:-3542.00%] [5R:-5.00%] [N/A:N/A] % Reduction in Volume: -7050.00% [5R:-5.10%] [N/A:N/A] Classification: [4:Full Thickness With Exposed Support Structures] [5R:Partial Thickness] [N/A:N/A] Exudate Amount: [4:Small] [5R:Medium] [N/A:N/A] Exudate Type: [4:Serosanguineous] [5R:Serosanguineous] [N/A:N/A] Exudate Color: [4:red, brown] [5R:red, brown] [N/A:N/A] Wound Margin: [4:Distinct, outline attached] [5R:Flat and Intact] [N/A:N/A] Granulation Amount: [4:None Present (0%)] [5R:Large (67-100%)] [N/A:N/A] Necrotic Amount: [4:Large (67-100%)] [5R:None Present (0%)] [N/A:N/A] Exposed Structures: [4:Fascia: Yes Tendon: Yes Fat: No Muscle: No] [5R:Fascia: No Fat: No Tendon: No Muscle: No Joint: No] [N/A:N/A] Joint: No Bone: No Bone: No Limited to Skin Breakdown Epithelialization: Small (1-33%) Small (1-33%)  N/A Periwound Skin Texture: Edema: Yes Edema: No N/A Excoriation: No Excoriation: No Induration: No Induration: No Callus: No Callus: No Crepitus: No Crepitus: No Fluctuance: No Fluctuance: No Friable: No Friable: No Rash: No Rash: No Scarring: No Scarring: No Periwound Skin Moist: Yes Moist: Yes N/A Moisture: Maceration: No Maceration: No Dry/Scaly: No Dry/Scaly: No Periwound Skin Color: Erythema: Yes Atrophie Blanche: No N/A Atrophie Blanche: No Cyanosis: No Cyanosis: No Ecchymosis: No Ecchymosis: No Erythema: No Hemosiderin Staining: No Hemosiderin Staining: No Mottled: No Mottled: No Pallor: No Pallor: No Rubor: No Rubor: No Erythema Location: Circumferential N/A N/A Temperature: No Abnormality No Abnormality N/A Tenderness on Yes No N/A Palpation: Wound Preparation: Ulcer Cleansing: Ulcer Cleansing: N/A Rinsed/Irrigated with Rinsed/Irrigated with Saline Saline Topical Anesthetic Topical Anesthetic Applied: Other: lidocaine Applied: Other: lidocaine 4% 4% Treatment Notes Electronic Signature(s) Signed: 01/28/2015 5:00:37 PM By: Montey Hora Entered By: Montey Hora on 01/28/2015 10:10:44 Philip [name] ([id number]) -------------------------------------------------------------------------------- [place] Details Patient Name: Philip [name], Philip [name]. Date of Service: 01/28/2015 9:30 AM Medical Record Number: [id number] Patient Account Number: [id number] Date of Birth/Sex: 02/20/1919 (81 y.o. Male) Treating RN: Montey Hora Primary Care Physician: Hortencia Pilar Other Clinician: Referring Physician: Hortencia Pilar Treating Physician/Extender: Frann Rider in Treatment: 27 Active Inactive Abuse / Safety / Falls / Self Care Management Nursing Diagnoses: Abuse or neglect; actual or potential Potential for falls Goals: Patient will remain injury free Date Initiated: 05/02/2014 Goal Status: Active Interventions: Assess  fall risk on admission and as needed Notes: Necrotic Tissue Nursing Diagnoses: Impaired tissue integrity related to necrotic/devitalized tissue Goals: Necrotic/devitalized tissue will be minimized in the wound bed Date Initiated: 05/02/2014 Goal Status: Active Interventions: Assess patient pain level pre-, during and post procedure and prior to discharge Treatment Activities: Apply topical anesthetic as ordered : 01/28/2015 Notes: Orientation to the Wound Care Program Nursing Diagnoses: Knowledge deficit related to the wound healing center program DEQUANTE, TREMAINE ([id number]) Goals: Patient/caregiver will verbalize understanding of the [facility] Program Date Initiated: 05/02/2014 Goal Status: Active Interventions: Provide education on orientation to the wound center Notes: Electronic Signature(s) Signed: 01/28/2015 5:00:37 PM By: Montey Hora Entered By: Montey Hora on 01/28/2015 10:06:34 Philip [name] ([id number]) -------------------------------------------------------------------------------- Patient/Caregiver Education Details Patient Name: Philip [name], Philip [name]. Date of Service: 01/28/2015 9:30 AM Medical Record Number: [id number] Patient Account Number: [id number] Date of Birth/Gender: Nov 05, 1918 (81 y.o. Male) Treating RN: Montey Hora Primary Care Physician: Hortencia Pilar Other Clinician: Referring Physician: Hortencia Pilar Treating Physician/Extender: Frann Rider in Treatment: 81 Education Assessment Education Provided To: Patient and Caregiver Education Topics Provided Wound/Skin Impairment: Handouts: Other: keep wrap in place and do not get it wet - call for nuse visit if needed Methods: Explain/Verbal Responses: State content correctly Electronic Signature(s) Signed: 01/28/2015 5:00:37 PM By: Montey Hora Entered By: Montey Hora on 01/28/2015 11:14:36 Philip [name].  (  [id number]) -------------------------------------------------------------------------------- Wound Assessment Details Patient Name: Philip [name], Philip [name]. Date of Service: 01/28/2015 9:30 AM Medical Record Number: [id number] Patient Account Number: [id number] Date of Birth/Sex: 12-15-1918 (81 y.o. Male) Treating RN: Montey Hora Primary Care Physician: Hortencia Pilar Other Clinician: Referring Physician: Hortencia Pilar Treating Physician/Extender: Frann Rider in Treatment: 44 Wound Status Wound Number: 4 Primary Malignant Wound Etiology: Wound Location: Left Malleolus - Medial Wound Status: Open Wounding Event: Other Lesion Comorbid Cataracts, Arrhythmia, Congestive Date Acquired: 09/20/2014 History: Heart Failure Weeks Of Treatment: 18 Clustered Wound: No Photos Photo Uploaded By: Montey Hora on 01/28/2015 13:01:51 Wound Measurements Length: (cm) 2.8 Width: (cm) 2.6 Depth: (cm) 0.2 Area: (cm) 5.718 Volume: (cm) 1.144 % Reduction in Area: -3542% % Reduction in Volume: -7050% Epithelialization: Small (1-33%) Tunneling: No Undermining: No Wound Description Full Thickness With Exposed Foul Odor Aft Classification: Support Structures Wound Margin: Distinct, outline attached Exudate Small Amount: Exudate Type: Serosanguineous Exudate Color: red, brown er Cleansing: No Wound Bed Granulation Amount: None Present (0%) Exposed Structure Necrotic Amount: Large (67-100%) Fascia Exposed: Yes Necrotic Quality: Adherent Slough Fat Layer Exposed: No BYRAN, BILOTTI ([id number]) Tendon Exposed: Yes Muscle Exposed: No Joint Exposed: No Bone Exposed: No Periwound Skin Texture Texture Color No Abnormalities Noted: No No Abnormalities Noted: No Callus: No Atrophie Blanche: No Crepitus: No Cyanosis: No Excoriation: No Ecchymosis: No Fluctuance: No Erythema: Yes Friable: No Erythema Location: Circumferential Induration: No Hemosiderin Staining: No Localized  Edema: Yes Mottled: No Rash: No Pallor: No Scarring: No Rubor: No Moisture Temperature / Pain No Abnormalities Noted: No Temperature: No Abnormality Dry / Scaly: No Tenderness on Palpation: Yes Maceration: No Moist: Yes Wound Preparation Ulcer Cleansing: Rinsed/Irrigated with Saline Topical Anesthetic Applied: Other: lidocaine 4%, Treatment Notes Wound #4 (Left, Medial Malleolus) 1. Cleansed with: Clean wound with Normal Saline 2. Anesthetic Topical Lidocaine 4% cream to wound bed prior to debridement 4. Dressing Applied: Mepitel Other dressing (specify in notes) 5. Secondary Dressing Applied Kerlix/Conform 7. Secured with Tape Notes Grafix applied by MD. Lillard [name] with gauze, kerlix and coban lightly from toes to 3cm under knee Electronic Signature(s) Signed: 01/28/2015 5:00:37 PM By: Montey Hora Entered By: Montey Hora on 01/28/2015 09:58:35 Philip [name] ([id number]) EWIN, REHBERG ([id number]) -------------------------------------------------------------------------------- Wound Assessment Details Patient Name: Philip [name], Philip [name]. Date of Service: 01/28/2015 9:30 AM Medical Record Number: [id number] Patient Account Number: [id number] Date of Birth/Sex: 1918/08/30 (81 y.o. Male) Treating RN: Montey Hora Primary Care Physician: Hortencia Pilar Other Clinician: Referring Physician: Hortencia Pilar Treating Physician/Extender: Frann Rider in Treatment: 106 Wound Status Wound Number: 5R Primary Skin Tear Etiology: Wound Location: Right Forearm - Lateral Wound Status: Open Wounding Event: Shear/Friction Comorbid Cataracts, Arrhythmia, Congestive Date Acquired: 09/22/2014 History: Heart Failure Weeks Of Treatment: 16 Clustered Wound: No Photos Photo Uploaded By: Montey Hora on 01/28/2015 13:01:52 Wound Measurements Length: (cm) 4.2 Width: (cm) 2 Depth: (cm) 0.1 Area: (cm) 6.597 Volume: (cm) 0.66 % Reduction in Area: -5% % Reduction in  Volume: -5.1% Epithelialization: Small (1-33%) Tunneling: No Undermining: No Wound Description Classification: Partial Thickness Foul Odor Aft Wound Margin: Flat and Intact Exudate Amount: Medium Exudate Type: Serosanguineous Exudate Color: red, brown er Cleansing: No Wound Bed Granulation Amount: Large (67-100%) Exposed Structure Necrotic Amount: None Present (0%) Fascia Exposed: No Fat Layer Exposed: No Tendon Exposed: No Philip [name], Philip [name]. ([id number]) Muscle Exposed: No Joint Exposed: No Bone Exposed: No Limited to Skin Breakdown Periwound Skin Texture Texture Color No Abnormalities Noted: No No Abnormalities Noted: No Callus: No Atrophie  Blanche: No Crepitus: No Cyanosis: No Excoriation: No Ecchymosis: No Fluctuance: No Erythema: No Friable: No Hemosiderin Staining: No Induration: No Mottled: No Localized Edema: No Pallor: No Rash: No Rubor: No Scarring: No Temperature / Pain Moisture Temperature: No Abnormality No Abnormalities Noted: No Dry / Scaly: No Maceration: No Moist: Yes Wound Preparation Ulcer Cleansing: Rinsed/Irrigated with Saline Topical Anesthetic Applied: Other: lidocaine 4%, Treatment Notes Wound #5R (Right, Lateral Forearm) 1. Cleansed with: Clean wound with Normal Saline 2. Anesthetic Topical Lidocaine 4% cream to wound bed prior to debridement 4. Dressing Applied: Mepitel Other dressing (specify in notes) 5. Secondary Dressing Applied Kerlix/Conform Notes xtrasorb, kerlix and coban lightly to secure Electronic Signature(s) Signed: 01/28/2015 5:00:37 PM By: Montey Hora Entered By: Montey Hora on 01/28/2015 10:05:29 Philip [name] ([id number]) -------------------------------------------------------------------------------- [place] Details Patient Name: Philip [name], Philip [name]. Date of Service: 01/28/2015 9:30 AM Medical Record Number: [id number] Patient Account Number: [id number] Date of Birth/Sex: 1919/03/30 (81 y.o.  Male) Treating RN: Montey Hora Primary Care Physician: Hortencia Pilar Other Clinician: Referring Physician: Hortencia Pilar Treating Physician/Extender: Frann Rider in Treatment: 64 Vital Signs Time Taken: 09:49 Temperature (F): 97.8 Height (in): 69 Pulse (bpm): 74 Weight (lbs): 179 Respiratory Rate (breaths/min): 16 Body Mass Index (BMI): 26.4 Blood Pressure (mmHg): 133/42 Reference Range: 80 - 120 mg / dl Electronic Signature(s) Signed: 01/28/2015 5:00:37 PM By: Montey Hora Entered By: Montey Hora on 01/28/2015 09:49:42

## 2015-01-29 NOTE — Progress Notes (Signed)
Philip [name] ([id number]) Visit Report for 01/28/2015 Chief Complaint Document Details Patient Name: Philip [name], Philip [name]. Date of Service: 01/28/2015 9:30 AM Medical Record Number: [id number] Patient Account Number: [id number] Date of Birth/Sex: 05-28-1918 (81 y.o. Male) Treating RN: Montey Hora Primary Care Physician: Hortencia Pilar Other Clinician: Referring Physician: Hortencia Pilar Treating Physician/Extender: Frann Rider in Treatment: 27 Information Obtained from: Patient Chief Complaint R foot ulcer. L forearm ulcer. 07/19/2014 -- about 2 weeks ago he had a surgical procedure done by dermatologist in [place] and has an open surgical wound on the dorsum of the right foot. Electronic Signature(s) Signed: 01/28/2015 11:04:40 AM By: Christin Fudge MD, FACS Entered By: Christin Fudge on 01/28/2015 11:04:39 Philip [name] ([id number]) -------------------------------------------------------------------------------- Cellular or Tissue Based Product Details Patient Name: Philip [name], Philip [name]. Date of Service: 01/28/2015 9:30 AM Medical Record Number: [id number] Patient Account Number: [id number] Date of Birth/Sex: [birth year] (81 y.o. Male) Treating RN: Montey Hora Primary Care Physician: Hortencia Pilar Other Clinician: Referring Physician: Hortencia Pilar Treating Physician/Extender: Frann Rider in Treatment: 49 Cellular or Tissue Based Wound #4 Left,Medial Malleolus Product Type Applied to: Performed By: Physician Christin Fudge, MD Cellular or Tissue Based Grafix prime Product Type: Time-Out Taken: Yes Location: trunk / arms / legs Wound Size (sq cm): 7.28 Product Size (sq cm): 12 Waste Size (sq cm): 5 Waste Reason: wound size Amount of Product Applied (sq cm): 7 Lot #: [id number] Expiration Date: 11/13/2016 Fenestrated: No Reconstituted: Yes Solution Type: saline Solution Amount: 50ml Lot #: [id number] Solution Expiration 08/17/2016 Date: Secured: Yes Secured  With: Steri-Strips Dressing Applied: Yes Primary Dressing: mepitel Procedural Pain: 0 Post Procedural Pain: 0 Response to Treatment: Procedure was tolerated well Post Procedure Diagnosis Same as Pre-procedure Electronic Signature(s) Signed: 01/28/2015 11:04:32 AM By: Christin Fudge MD, FACS Entered By: Christin Fudge on 01/28/2015 11:04:32 Philip [name] ([id number]) -------------------------------------------------------------------------------- HPI Details Patient Name: Philip [name]. Date of Service: 01/28/2015 9:30 AM Medical Record Number: [id number] Patient Account Number: [id number] Date of Birth/Sex: [birth year] (81 y.o. Male) Treating RN: Montey Hora Primary Care Physician: Hortencia Pilar Other Clinician: Referring Physician: Hortencia Pilar Treating Physician/Extender: Frann Rider in Treatment: 1 History of Present Illness Location: right leg Duration: Dec 2015 Modifying Factors: history of an injury to the right leg with resulting hematoma and thrombophlebitis and later an ulcer of posterior leg Associated Signs and Symptoms: marked lymphedema of the right leg. He is already on Eloquis. HPI Description: 06/14/14 -- He returns for followup today. He denies any fevers. no fresh issues and his daughter says he's been doing fine. 06/21/14 -- after he sustained a fall this week earlier he applied a bandage over this himself and did not seek any medical attention. he did however manage to control the bleeding and had a dressing in place the next morning when his son to the visit. In this dressing was removed there was further damaged skin. His right leg has been doing fine otherwise. 07/12/14 --Very pleasant 81 year old with past medical history significant for congestive heart failure (EF 15%), peripheral vascular disease, and chronic kidney disease. He was hospitalized at [facility] in December 2015 for congestive heart failure. He says that he fell during his hospital  course and developed a hematoma over his right calf. He was also diagnosed with a right lower extremity DVT for which he takes Eliquis. The hematoma subsequently turned into an ulceration around Christmas, which has healed. He subsequently developed an ulcer on his right dorsal foot and a traumatic left forearm  ulcer. Per his report, he underwent biopsy of the right dorsal foot ulceration which demonstrated a skin cancer. His PCP and dermatologist office are both closed today. I reviewed his records in [place] but find no report of biopsy or pathology. He s without complaints today. No significant pain. No fever or chills. Minimal drainage. 07/19/2014 - the patient and his son tell me that about 2 weeks ago the dermatologist did a skin biopsy and this was a large area on the dorsum of his right foot which was left open and no dressing instructions were recommended. Since then he has been called and told that it is a cancer and the need to do a further procedure but that will not happen until about 2 weeks from now. In the meanwhile the patient has not been taking care of his right foot. The left forearm where he had an abrasion and laceration is doing very well. 07/26/2014 -- Reports from 07/08/2014 from the dermatology group reviewed. A excision was done of a lesion located on the dorsum of the right foot and this was 1.7 cm in diameter which was a shave biopsy performed. The wound was left open after appropriate cauterization and the patient was given this dressing instructions. 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 08/02/2014 -- all the original problems he came with have completely resolved. He now has a surgical wound on his right foot dorsum where a skin cancer was excised. He goes to see his dermatologist this DARCY, CORDNER ([id number]) coming Tuesday and will have definite news next  Friday. 08/09/2014 he had gone to his dermatologist on Tuesday and she has injected the base of his ulcer with some chemotherapeutic agent. He was supposed to bring some papers with him but forgot to get them and will bring them in next week. Other than that the dermatologist had suggested using Mehdi honey on the wound. 08/16/2014 -- the patient has brought in his notes from the dermatologist and on 08/06/2014 he received a injection of 5 FU, 500 mg grams into the lesion. the pathology report was also sent and it was a squamous cell carcinoma well-differentiated and edges were involved. They wanted him to use many honey for the wound dressing changes to be done 3 times a week. 08/30/2014 -- he has finished his second injection of 5-FU and has the next one in 2 weeks' time. He is doing fine otherwise. 09/13/2014 - No new complaints. No significant pain. No fever or chills. Minimal drainage. Still receiving 5-FU injections. 09/20/2014 -- He was seen by the dermatologist on 09/10/2014 and Dr. Phillip Heal injected his foot both the right on the dorsum and left near the medial malleolus with 5-FU. The next dose of 5-FU is to be given after 3 months. The patient says he now has a spot on the left medial malleolus where he was injected with 5-FU. 09/27/2014 -- the area on the left ankle where he was injected with 5-FU is now a full-blown ulcer. He also has mild pain in both ankle areas. 10/04/2014 - large had a bit of a fall and injured his right arm last evening and has had a laceration with no evidence of any foreign body in the right forearm. 12/20/2014 -- he recently saw his dermatologist at [facility] and she was pleased with his wound healing on the right lower extremity. She has not given him any further 5-FU injections and will see  him back on a when necessary basis. 01/03/2015 -- his skin substitute Grafix has been approved by his insurance and we will go ahead with this next week. 01/10/2015 --  he has his first application of Grafix today. he recently had a nightmare and injured his right forearm and had some abrasions. 01/13/2015 -- Iona Beard has developed significant swelling of the left calf with some tenderness of the calf. This was only noticed this morning. Addendum: The DVT study done in the hospital -- IMPRESSION:No evidence of deep venous thrombosis left lower extremity. The result was called into the patient's son who acknowledges the report and will bring the patient back on Friday 01/17/2015 -- he saw his PCP Dr. Hoy Morn and she has increases dosage of Lasix and his edema on the left lower extremity is looking much better. He is here for a second applications of Grafix 35/36/1443 -- he is overall doing very well his edema has come down significantly. He is here for his third application of grafix. Electronic Signature(s) Signed: 01/28/2015 11:05:20 AM By: Christin Fudge MD, FACS Entered By: Christin Fudge on 01/28/2015 11:05:20 Philip [name] ([id number], REMMERS ([id number]) -------------------------------------------------------------------------------- Physical Exam Details Patient Name: Philip [name], Philip [name]. Date of Service: 01/28/2015 9:30 AM Medical Record Number: [id number] Patient Account Number: [id number] Date of Birth/Sex: 01-18-1919 (81 y.o. Male) Treating RN: Montey Hora Primary Care Physician: Hortencia Pilar Other Clinician: Referring Physician: Hortencia Pilar Treating Physician/Extender: Frann Rider in Treatment: 62 Constitutional . Pulse regular. Respirations normal and unlabored. Afebrile. . Eyes Nonicteric. Reactive to light. Ears, Nose, Mouth, and Throat Lips, teeth, and gums WNL.Marland Kitchen Moist mucosa without lesions . Neck supple and nontender. No palpable supraclavicular or cervical adenopathy. Normal sized without goiter. Respiratory WNL. No retractions.. Cardiovascular Pedal Pulses WNL. No clubbing, cyanosis or  edema. Lymphatic No adneopathy. No adenopathy. No adenopathy. Musculoskeletal Adexa without tenderness or enlargement.. Digits and nails w/o clubbing, cyanosis, infection, petechiae, ischemia, or inflammatory conditions.. Integumentary (Hair, Skin) No suspicious lesions. No crepitus or fluctuance. No peri-wound warmth or erythema. No masses.Marland Kitchen Psychiatric Judgement and insight Intact.. No evidence of depression, anxiety, or agitation.. Notes The edema has gone down remarkably and there is no cellulitis. The medial left ankle wound has some slough which will need sharp debridement with a curette. The right arm is looking much better and there is no debridement to be done. Electronic Signature(s) Signed: 01/28/2015 11:06:15 AM By: Christin Fudge MD, FACS Entered By: Christin Fudge on 01/28/2015 11:06:14 Philip [name] ([id number]) -------------------------------------------------------------------------------- Physician Orders Details Patient Name: KEISHAUN, HAZEL. Date of Service: 01/28/2015 9:30 AM Medical Record Number: [id number] Patient Account Number: [id number] Date of Birth/Sex: 11/15/1918 (81 y.o. Male) Treating RN: Montey Hora Primary Care Physician: Hortencia Pilar Other Clinician: Referring Physician: Hortencia Pilar Treating Physician/Extender: Frann Rider in Treatment: 28 Verbal / Phone Orders: Yes Clinician: Montey Hora Read Back and Verified: Yes Diagnosis Coding Wound Cleansing Wound #4 Left,Medial Malleolus o Clean wound with Normal Saline. Wound #5R Right,Lateral Forearm o Clean wound with Normal Saline. Anesthetic Wound #4 Left,Medial Malleolus o Topical Lidocaine 4% cream applied to wound bed prior to debridement Wound #5R Right,Lateral Forearm o Topical Lidocaine 4% cream applied to wound bed prior to debridement Primary Wound Dressing Wound #5R Right,Lateral Forearm o XtraSorb o Mepitel One Wound #4 Left,Medial Malleolus o  Mepitel One o Other: - grafix Secondary Dressing Wound #5R Right,Lateral Forearm o Conform/Kerlix - lightly secured with Coban Wound #4 Left,Medial Malleolus o ABD and Kerlix/Conform - lightly secured  with Coban Dressing Change Frequency Wound #4 Left,Medial Malleolus o Change dressing every week Wound #5R Right,Lateral Forearm o Change dressing every week BIRD, SWETZ ([id number]) Follow-up Appointments Wound #4 Left,Medial Malleolus o Return Appointment in 1 week. Wound #5R Right,Lateral Forearm o Return Appointment in 1 week. Home Health Wound #4 [place] Nurse may visit PRN to address patientos wound care needs. - Dressings do not need to be changed this week due to Grafix being applied to ankle. o FACE TO FACE ENCOUNTER: MEDICARE and MEDICAID PATIENTS: I certify that this patient is under my care and that I had a face-to-face encounter that meets the physician face-to-face encounter requirements with this patient on this date. The encounter with the patient was in whole or in part for the following MEDICAL CONDITION: (primary reason for [place]) MEDICAL NECESSITY: I certify, that based on my findings, NURSING services are a medically necessary home health service. HOME BOUND STATUS: I certify that my clinical findings support that this patient is homebound (i.e., Due to illness or injury, pt requires aid of supportive devices such as crutches, cane, wheelchairs, walkers, the use of special transportation or the assistance of another person to leave their place of residence. There is a normal inability to leave the home and doing so requires considerable and taxing effort. Other absences are for medical reasons / religious services and are infrequent or of short duration when for other reasons). o If current dressing causes regression in wound condition, may D/C ordered dressing product/s and apply Normal Saline Moist  Dressing daily until next [place] / Other MD appointment. [place] of regression in wound condition at [phone]. o Please direct any NON-WOUND related issues/requests for orders to patient's Primary Care Physician Wound #5R Right,Lateral Forearm o Home Health Nurse may visit PRN to address patientos wound care needs. - Dressings do not need to be changed this week due to Grafix being applied to ankle. o FACE TO FACE ENCOUNTER: MEDICARE and MEDICAID PATIENTS: I certify that this patient is under my care and that I had a face-to-face encounter that meets the physician face-to-face encounter requirements with this patient on this date. The encounter with the patient was in whole or in part for the following MEDICAL CONDITION: (primary reason for [place]) MEDICAL NECESSITY: I certify, that based on my findings, NURSING services are a medically necessary home health service. HOME BOUND STATUS: I certify that my clinical findings support that this patient is homebound (i.e., Due to illness or injury, pt requires aid of supportive devices such as crutches, cane, wheelchairs, walkers, the use of special transportation or the assistance of another person to leave their place of residence. There is a normal inability to leave the home and doing so requires considerable and taxing effort. Other absences are for medical reasons / religious services and are infrequent or of short duration when for other reasons). o If current dressing causes regression in wound condition, may D/C ordered dressing product/s and apply Normal Saline Moist Dressing daily until next [place] / Other MD appointment. [place] of regression in wound condition at [phone]. ZAKIR, HENNER ([id number]) o Please direct any NON-WOUND related issues/requests for orders to patient's Primary Care Physician Advanced Therapies Wound #4 Left,Medial  Malleolus o Grafix Core application in clinic; including contact layer, fixation with steri strips, dry gauze and cover dressing. - 3x4 Electronic Signature(s) Signed: 01/28/2015 4:43:21 PM By: Christin Fudge MD, FACS  Signed: 01/28/2015 5:00:37 PM By: Montey Hora Entered By: Montey Hora on 01/28/2015 10:32:20 Philip [name] ([id number]) -------------------------------------------------------------------------------- Problem List Details Patient Name: Philip [name], Philip [name]. Date of Service: 01/28/2015 9:30 AM Medical Record Number: [id number] Patient Account Number: [id number] Date of Birth/Sex: 1918-09-11 (81 y.o. Male) Treating RN: Montey Hora Primary Care Physician: Hortencia Pilar Other Clinician: Referring Physician: Hortencia Pilar Treating Physician/Extender: Frann Rider in Treatment: 55 Active Problems ICD-10 Encounter Code Description Active Date Diagnosis I70.232 Atherosclerosis of native arteries of right leg with 06/21/2014 Yes ulceration of calf I82.401 Acute embolism and thrombosis of unspecified deep veins 06/21/2014 Yes of right lower extremity S91.301A Unspecified open wound, right foot, initial encounter 07/19/2014 Yes Z92.21 Personal history of antineoplastic chemotherapy 08/23/2014 Yes L97.522 Non-pressure chronic ulcer of other part of left foot with fat 09/20/2014 Yes layer exposed S41.111A Laceration without foreign body of right upper arm, initial 10/04/2014 Yes encounter Inactive Problems Resolved Problems ICD-10 Code Description Active Date Resolved Date L97.212 Non-pressure chronic ulcer of right calf with fat layer 06/21/2014 06/21/2014 exposed S51.812A Laceration without foreign body of left forearm, initial 06/21/2014 06/21/2014 encounter CLAYSON, RILING ([id number]) Electronic Signature(s) Signed: 01/28/2015 11:04:17 AM By: Christin Fudge MD, FACS Entered By: Christin Fudge on 01/28/2015 11:04:17 Philip [name]  ([id number]) -------------------------------------------------------------------------------- Progress Note Details Patient Name: Philip [name]. Date of Service: 01/28/2015 9:30 AM Medical Record Number: [id number] Patient Account Number: [id number] Date of Birth/Sex: 01-Jan-1919 (81 y.o. Male) Treating RN: Montey Hora Primary Care Physician: Hortencia Pilar Other Clinician: Referring Physician: Hortencia Pilar Treating Physician/Extender: Frann Rider in Treatment: 18 Subjective Chief Complaint Information obtained from Patient R foot ulcer. L forearm ulcer. 07/19/2014 -- about 2 weeks ago he had a surgical procedure done by dermatologist in [place] and has an open surgical wound on the dorsum of the right foot. History of Present Illness (HPI) The following HPI elements were documented for the patient's wound: Location: right leg Duration: Dec 2015 Modifying Factors: history of an injury to the right leg with resulting hematoma and thrombophlebitis and later an ulcer of posterior leg Associated Signs and Symptoms: marked lymphedema of the right leg. He is already on Eloquis. 06/14/14 -- He returns for followup today. He denies any fevers. no fresh issues and his daughter says he's been doing fine. 06/21/14 -- after he sustained a fall this week earlier he applied a bandage over this himself and did not seek any medical attention. he did however manage to control the bleeding and had a dressing in place the next morning when his son to the visit. In this dressing was removed there was further damaged skin. His right leg has been doing fine otherwise. 07/12/14 --Very pleasant 81 year old with past medical history significant for congestive heart failure (EF 15%), peripheral vascular disease, and chronic kidney disease. He was hospitalized at [facility] in December 2015 for congestive heart failure. He says that he fell during his hospital course and developed a hematoma over his right  calf. He was also diagnosed with a right lower extremity DVT for which he takes Eliquis. The hematoma subsequently turned into an ulceration around Christmas, which has healed. He subsequently developed an ulcer on his right dorsal foot and a traumatic left forearm ulcer. Per his report, he underwent biopsy of the right dorsal foot ulceration which demonstrated a skin cancer. His PCP and dermatologist office are both closed today. I reviewed his records in [place] but find no report of biopsy or pathology. He s without complaints today. No  significant pain. No fever or chills. Minimal drainage. 07/19/2014 - the patient and his son tell me that about 2 weeks ago the dermatologist did a skin biopsy and this was a large area on the dorsum of his right foot which was left open and no dressing instructions were recommended. Since then he has been called and told that it is a cancer and the need to do a further procedure but that will not happen until about 2 weeks from now. In the meanwhile the patient has not been taking care of his right foot. The left forearm where he had an abrasion and laceration is doing very well. ZALAN, SHIDLER ([id number]) 07/26/2014 -- Reports from 07/08/2014 from the dermatology group reviewed. A excision was done of a lesion located on the dorsum of the right foot and this was 1.7 cm in diameter which was a shave biopsy performed. The wound was left open after appropriate cauterization and the patient was given this dressing instructions. 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 08/02/2014 -- all the original problems he came with have completely resolved. He now has a surgical wound on his right foot dorsum where a skin cancer was excised. He goes to see his dermatologist this coming Tuesday and will have definite news next Friday. 08/09/2014 he had gone to his dermatologist on  Tuesday and she has injected the base of his ulcer with some chemotherapeutic agent. He was supposed to bring some papers with him but forgot to get them and will bring them in next week. Other than that the dermatologist had suggested using Mehdi honey on the wound. 08/16/2014 -- the patient has brought in his notes from the dermatologist and on 08/06/2014 he received a injection of 5 FU, 500 mg grams into the lesion. the pathology report was also sent and it was a squamous cell carcinoma well-differentiated and edges were involved. They wanted him to use many honey for the wound dressing changes to be done 3 times a week. 08/30/2014 -- he has finished his second injection of 5-FU and has the next one in 2 weeks' time. He is doing fine otherwise. 09/13/2014 - No new complaints. No significant pain. No fever or chills. Minimal drainage. Still receiving 5-FU injections. 09/20/2014 -- He was seen by the dermatologist on 09/10/2014 and Dr. Phillip Heal injected his foot both the right on the dorsum and left near the medial malleolus with 5-FU. The next dose of 5-FU is to be given after 3 months. The patient says he now has a spot on the left medial malleolus where he was injected with 5-FU. 09/27/2014 -- the area on the left ankle where he was injected with 5-FU is now a full-blown ulcer. He also has mild pain in both ankle areas. 10/04/2014 - large had a bit of a fall and injured his right arm last evening and has had a laceration with no evidence of any foreign body in the right forearm. 12/20/2014 -- he recently saw his dermatologist at [facility] and she was pleased with his wound healing on the right lower extremity. She has not given him any further 5-FU injections and will see him back on a when necessary basis. 01/03/2015 -- his skin substitute Grafix has been approved by his insurance and we will go ahead with this next week. 01/10/2015 -- he has his first application of Grafix today. he  recently had a nightmare and injured his  right forearm and had some abrasions. 01/13/2015 -- Iona Beard has developed significant swelling of the left calf with some tenderness of the calf. This was only noticed this morning. Addendum: The DVT study done in the hospital -- IMPRESSION:No evidence of deep venous thrombosis left lower extremity. The result was called into the patient's son who acknowledges the report and will bring the patient back on Friday RAMI, BUDHU ([id number]) 01/17/2015 -- he saw his PCP Dr. Hoy Morn and she has increases dosage of Lasix and his edema on the left lower extremity is looking much better. He is here for a second applications of Grafix 98/92/1194 -- he is overall doing very well his edema has come down significantly. He is here for his third application of grafix. Objective Constitutional Pulse regular. Respirations normal and unlabored. Afebrile. Vitals Time Taken: 9:49 AM, Height: 69 in, Weight: 179 lbs, BMI: 26.4, Temperature: 97.8 F, Pulse: 74 bpm, Respiratory Rate: 16 breaths/min, Blood Pressure: 133/42 mmHg. Eyes Nonicteric. Reactive to light. Ears, Nose, Mouth, and Throat Lips, teeth, and gums WNL.Marland Kitchen Moist mucosa without lesions . Neck supple and nontender. No palpable supraclavicular or cervical adenopathy. Normal sized without goiter. Respiratory WNL. No retractions.. Cardiovascular Pedal Pulses WNL. No clubbing, cyanosis or edema. Lymphatic No adneopathy. No adenopathy. No adenopathy. Musculoskeletal Adexa without tenderness or enlargement.. Digits and nails w/o clubbing, cyanosis, infection, petechiae, ischemia, or inflammatory conditions.Marland Kitchen Psychiatric Judgement and insight Intact.. No evidence of depression, anxiety, or agitation.. General Notes: The edema has gone down remarkably and there is no cellulitis. The medial left ankle wound has some slough which will need sharp debridement with a curette. The right arm is looking  much better and there is no debridement to be done. Integumentary (Hair, Skin) PEYSON, POSTEMA. ([id number]) No suspicious lesions. No crepitus or fluctuance. No peri-wound warmth or erythema. No masses.. Wound #4 status is Open. Original cause of wound was Other Lesion. The wound is located on the Left,Medial Malleolus. The wound measures 2.8cm length x 2.6cm width x 0.2cm depth; 5.718cm^2 area and 1.144cm^3 volume. There is tendon and fascia exposed. There is no tunneling or undermining noted. There is a small amount of serosanguineous drainage noted. The wound margin is distinct with the outline attached to the wound base. There is no granulation within the wound bed. There is a large (67-100%) amount of necrotic tissue within the wound bed including Adherent Slough. The periwound skin appearance exhibited: Localized Edema, Moist, Erythema. The periwound skin appearance did not exhibit: Callus, Crepitus, Excoriation, Fluctuance, Friable, Induration, Rash, Scarring, Dry/Scaly, Maceration, Atrophie Blanche, Cyanosis, Ecchymosis, Hemosiderin Staining, Mottled, Pallor, Rubor. The surrounding wound skin color is noted with erythema which is circumferential. Periwound temperature was noted as No Abnormality. The periwound has tenderness on palpation. Wound #5R status is Open. Original cause of wound was Shear/Friction. The wound is located on the Right,Lateral Forearm. The wound measures 4.2cm length x 2cm width x 0.1cm depth; 6.597cm^2 area and 0.66cm^3 volume. The wound is limited to skin breakdown. There is no tunneling or undermining noted. There is a medium amount of serosanguineous drainage noted. The wound margin is flat and intact. There is large (67-100%) granulation within the wound bed. There is no necrotic tissue within the wound bed. The periwound skin appearance exhibited: Moist. The periwound skin appearance did not exhibit: Callus, Crepitus, Excoriation, Fluctuance, Friable,  Induration, Localized Edema, Rash, Scarring, Dry/Scaly, Maceration, Atrophie Blanche, Cyanosis, Ecchymosis, Hemosiderin Staining, Mottled, Pallor, Rubor, Erythema. Periwound temperature was noted as No Abnormality. 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 This will not be changed for a week. On his left medial ankle Grafix was applied with the usual precaution and bolstered in place and a light 2 layer compression was applied. MARKIE, FRITH ([id number]) We'll see him back next Tuesday. 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 A skin graft procedure using a bioengineered skin substitute/cellular or tissue based product was performed by Christin Fudge, MD. Grafix prime was applied and secured with Steri-Strips. 7 sq cm of product was utilized and 5 sq cm was wasted due to wound size. Post Application, mepitel was applied. A Time Out was conducted prior to the start of the procedure. The procedure was tolerated well with a pain level of 0 throughout and a pain level of 0 following the procedure. Post procedure Diagnosis Wound #4: Same as Pre-Procedure . Plan Wound Cleansing: Wound #4 Left,Medial Malleolus: Clean wound with Normal Saline. Wound #5R Right,Lateral Forearm: Clean wound with Normal Saline. Anesthetic: Wound #4 Left,Medial Malleolus: Topical Lidocaine 4% cream applied to  wound bed prior to debridement Wound #5R Right,Lateral Forearm: Topical Lidocaine 4% cream applied to wound bed prior to debridement Primary Wound Dressing: Wound #5R Right,Lateral Forearm: XtraSorb Mepitel One Wound #4 Left,Medial Malleolus: Mepitel One Other: - grafix Secondary Dressing: Wound #5R Right,Lateral Forearm: Conform/Kerlix - lightly secured with Coban Wound #4 Left,Medial Malleolus: ABD and Kerlix/Conform - lightly secured with Coban Dressing Change Frequency: Wound #4 Left,Medial Malleolus: Change dressing every week Wound #5R Right,Lateral Forearm: Change dressing every week Follow-up Appointments: Wound #4 Left,Medial MalleolusHASSEN, BRUUN ([id number]) Return Appointment in 1 week. Wound #5R Right,Lateral Forearm: Return Appointment in 1 week. Home Health: Wound #4 Left,Medial Malleolus: Home Health Nurse may visit PRN to address patient s wound care needs. - Dressings do not need to be changed this week due to Grafix being applied to ankle. FACE TO FACE ENCOUNTER: MEDICARE and MEDICAID PATIENTS: I certify that this patient is under my care and that I had a face-to-face encounter that meets the physician face-to-face encounter requirements with this patient on this date. The encounter with the patient was in whole or in part for the following MEDICAL CONDITION: (primary reason for [place]) MEDICAL NECESSITY: I certify, that based on my findings, NURSING services are a medically necessary home health service. HOME BOUND STATUS: I certify that my clinical findings support that this patient is homebound (i.e., Due to illness or injury, pt requires aid of supportive devices such as crutches, cane, wheelchairs, walkers, the use of special transportation or the assistance of another person to leave their place of residence. There is a normal inability to leave the home and doing so requires considerable and taxing effort. Other absences are for medical  reasons / religious services and are infrequent or of short duration when for other reasons). If current dressing causes regression in wound condition, may D/C ordered dressing product/s and apply Normal Saline Moist Dressing daily until next Wound  Healing Center / Other MD appointment. [place] of regression in wound condition at [phone]. Please direct any NON-WOUND related issues/requests for orders to patient's Primary Care Physician Wound #5R Right,Lateral Forearm: Home Health Nurse may visit PRN to address patient s wound care needs. - Dressings do not need to be changed this week due to Grafix being applied to ankle. FACE TO FACE ENCOUNTER: MEDICARE and MEDICAID PATIENTS: I certify that this patient is under my care and that I had a face-to-face encounter that meets the physician face-to-face encounter requirements with this patient on this date. The encounter with the patient was in whole or in part for the following MEDICAL CONDITION: (primary reason for [place]) MEDICAL NECESSITY: I certify, that based on my findings, NURSING services are a medically necessary home health service. HOME BOUND STATUS: I certify that my clinical findings support that this patient is homebound (i.e., Due to illness or injury, pt requires aid of supportive devices such as crutches, cane, wheelchairs, walkers, the use of special transportation or the assistance of another person to leave their place of residence. There is a normal inability to leave the home and doing so requires considerable and taxing effort. Other absences are for medical reasons / religious services and are infrequent or of short duration when for other reasons). If current dressing causes regression in wound condition, may D/C ordered dressing product/s and apply Normal Saline Moist Dressing daily until next [place] / Other MD appointment. [place] of regression in wound  condition at [phone]. Please direct any NON-WOUND related issues/requests for orders to patient's Primary Care Physician Advanced Therapies: Wound #4 Left,Medial Malleolus: Grafix Core application in clinic; including contact layer, fixation with steri strips, dry gauze and cover dressing. - 3x4 On his right arm will apply Mepitel and then the DrawTex and a light bandage to keep everything in place. This will not be changed for a week. BREVYN, RING ([id number]) On his left medial ankle Grafix was applied with the usual precaution and bolstered in place and a light 2 layer compression was applied. We'll see him back next Tuesday. Electronic Signature(s) Signed: 01/28/2015 11:07:41 AM By: Christin Fudge MD, FACS Entered By: Christin Fudge on 01/28/2015 11:07:41 Philip [name] ([id number]) -------------------------------------------------------------------------------- SuperBill Details Patient Name: Philip [name], Philip [name]. Date of Service: 01/28/2015 Medical Record Number: [id number] Patient Account Number: [id number] Date of Birth/Sex: September 28, 1918 (81 y.o. 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 S41.111A Laceration without foreign body of right upper arm, initial encounter Facility Procedures The patient participates with Medicare or their insurance follows the Medicare Facility Guidelines: CPT4 Description Modifier  Quantity Code [id number] (Facility Use Only) Grafix Prime 1 SQ CM 12 The patient participates with Medicare or their insurance follows the Medicare Facility Guidelines: [id number] - SKIN SUB GRAFT TRNK/ARM/LEG 1 ICD-10 Description Diagnosis I70.232 Atherosclerosis of native arteries of right leg with ulceration of  calf I82.401 Acute embolism and thrombosis of unspecified deep veins of right lower extremity L97.522 Non-pressure chronic ulcer of other part of left  foot with fat layer exposed S41.111A Laceration without foreign body of right upper arm, initial  encounter Physician Procedures CPT4: Description Modifier Quantity Code [id number] - WC PHYS SKIN SUB GRAFT TRNK/ARM/LEG 1 ICD-10 Description Diagnosis I70.232 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

## 2015-01-31 ENCOUNTER — Encounter: Payer: Medicare Other | Admitting: Surgery

## 2015-02-04 ENCOUNTER — Encounter: Payer: Medicare Other | Admitting: Surgery

## 2015-02-04 DIAGNOSIS — L97522 Non-pressure chronic ulcer of other part of left foot with fat layer exposed: Secondary | ICD-10-CM | POA: Diagnosis not present

## 2015-02-04 NOTE — Progress Notes (Signed)
Philip [name] ([id number]) Visit Report for 02/04/2015 Arrival Information Details Patient Name: Philip [name], Philip [name]. Date of Service: 02/04/2015 11:30 AM Medical Record Number: [id number] Patient Account Number: [id number] Date of Birth/Sex: [birth year] (81 y.o. Male) Treating RN: Baruch Gouty, RN, BSN, Velva Harman Primary Care Physician: Hortencia Pilar Other Clinician: Referring Physician: Hortencia Pilar Treating Physician/Extender: Frann Rider in Treatment: 40 Visit Information History Since Last Visit Any new allergies or adverse reactions: No Patient Arrived: [name] Had a fall or experienced change in No Arrival Time: 11:27 activities of daily living that may affect Transfer Assistance: None risk of falls: Patient Identification Verified: Yes Signs or symptoms of abuse/neglect since last No Secondary Verification Process Yes visito Completed: Has Dressing in Place as Prescribed: Yes Patient Requires Transmission- No Pain Present Now: No Based Precautions: Patient Has Alerts: Yes Patient Alerts: Patient on Blood Thinner No ABIs dt LE blood clots Electronic Signature(s) Signed: 02/04/2015 11:28:06 AM By: Regan Lemming BSN, RN Entered By: Regan Lemming on 02/04/2015 11:28:05 Philip [name] ([id number]) -------------------------------------------------------------------------------- Encounter Discharge Information Details Patient Name: Philip [name], Philip [name]. Date of Service: 02/04/2015 11:30 AM Medical Record Number: [id number] Patient Account Number: [id number] Date of Birth/Sex: [birth year] (81 y.o. Male) Treating RN: Baruch Gouty, RN, BSN, Velva Harman Primary Care Physician: Hortencia Pilar Other Clinician: Referring Physician: Hortencia Pilar Treating Physician/Extender: Frann Rider in Treatment: 31 Encounter Discharge Information Items Discharge Pain Level: 0 Discharge Condition: Stable Ambulatory Status: Walker Discharge Destination: Home Transportation: Private Auto Accompanied By:  son Schedule Follow-up Appointment: No Medication Reconciliation completed and provided to Patient/Care No Magnum Lunde: Provided on Clinical Summary of Care: 02/04/2015 Form Type Recipient Paper Patient GT Electronic Signature(s) Signed: 02/04/2015 12:19:56 PM By: Regan Lemming BSN, RN Previous Signature: 02/04/2015 12:12:21 PM Version By: Ruthine Dose Entered By: Regan Lemming on 02/04/2015 12:19:56 Philip [name] ([id number]) -------------------------------------------------------------------------------- Lower Extremity Assessment Details Patient Name: Philip [name], Philip [name]. Date of Service: 02/04/2015 11:30 AM Medical Record Number: [id number] Patient Account Number: [id number] Date of Birth/Sex: 1918/05/11 (81 y.o. Male) Treating RN: Baruch Gouty, RN, BSN, Velva Harman Primary Care Physician: Hortencia Pilar Other Clinician: Referring Physician: Hortencia Pilar Treating Physician/Extender: Frann Rider in Treatment: 39 Edema Assessment Assessed: [Left: No] [Right: No] Edema: [Left: Ye] [Right: s] Vascular Assessment Pulses: Posterior Tibial Dorsalis Pedis Palpable: [Left:Yes] Extremity colors, hair growth, and conditions: Extremity Color: [Left:Hyperpigmented] Hair Growth on Extremity: [Left:No] Temperature of Extremity: [Left:Warm] Capillary Refill: [Left:< 3 seconds] Toe Nail Assessment Left: Right: Thick: Yes Discolored: Yes Deformed: No Improper Length and Hygiene: No Electronic Signature(s) Signed: 02/04/2015 11:28:48 AM By: Regan Lemming BSN, RN Entered By: Regan Lemming on 02/04/2015 11:28:48 Philip [name] ([id number]) -------------------------------------------------------------------------------- Multi Wound Chart Details Patient Name: Philip [name]. Date of Service: 02/04/2015 11:30 AM Medical Record Number: [id number] Patient Account Number: [id number] Date of Birth/Sex: Feb 23, 1919 (81 y.o. 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] [5R:Skin Tear] [N/A:N/A] Comorbid History: [4:Cataracts, Arrhythmia, Congestive Heart Failure] [5R:Cataracts, Arrhythmia, Congestive Heart Failure] [N/A:N/A] Date Acquired: [4:09/20/2014] [5R:09/22/2014] [N/A:N/A] Weeks of Treatment: [4:19] [5R:17] [N/A:N/A] Wound Status: [4:Open] [5R:Open] [N/A:N/A] Wound Recurrence: [4:No] [5R:Yes] [N/A:N/A] Measurements L x W x D 2.6x2.4x0.2 [  5R:9x6.5x0.1] [N/A:N/A] (cm) Area (cm) : [4:4.901] [5R:45.946] [N/A:N/A] Volume (cm) : [4:0.98] [5R:4.595] [N/A:N/A] % Reduction in Area: [4:-3021.70%] [5R:-631.30%] [N/A:N/A] % Reduction in Volume: -6025.00% [5R:-631.70%] [N/A:N/A] Classification: [4:Full Thickness With Exposed Support Structures] [5R:Partial Thickness] [N/A:N/A] Exudate Amount: [4:Small] [5R:Medium] [N/A:N/A] Exudate Type: [4:Serosanguineous] [5R:Serosanguineous] [N/A:N/A] Exudate Color: [4:red, brown] [5R:red, brown] [N/A:N/A] Wound Margin: [4:Distinct, outline attached] [5R:Flat and Intact] [N/A:N/A] Granulation Amount: [4:None Present (0%)] [5R:Large (67-100%)] [N/A:N/A] Necrotic Amount: [4:Large (67-100%)] [5R:None Present (0%)] [N/A:N/A] Exposed Structures: [4:Fascia: Yes Tendon: Yes Fat: No Muscle: No] [5R:Fascia: No Fat: No Tendon: No Muscle: No Joint: No] [N/A:N/A] Joint: No Bone: No Bone: No Limited to Skin Breakdown Epithelialization: Small (1-33%) Small (1-33%) N/A Periwound Skin Texture: Edema: Yes  Edema: No N/A Excoriation: No Excoriation: No Induration: No Induration: No Callus: No Callus: No Crepitus: No Crepitus: No Fluctuance: No Fluctuance: No Friable: No Friable: No Rash: No Rash: No Scarring: No Scarring: No Periwound Skin Moist: Yes Maceration: Yes N/A Moisture: Maceration: No Moist: Yes Dry/Scaly: No Dry/Scaly: No Periwound Skin Color: Erythema: Yes Atrophie Blanche: No N/A Atrophie Blanche: No Cyanosis: No Cyanosis: No Ecchymosis: No Ecchymosis: No Erythema: No Hemosiderin Staining: No Hemosiderin Staining: No Mottled: No Mottled: No Pallor: No Pallor: No Rubor: No Rubor: No Erythema Location: Circumferential N/A N/A Temperature: No Abnormality No Abnormality N/A Tenderness on Yes No N/A Palpation: Wound Preparation: Ulcer Cleansing: Ulcer Cleansing: N/A Rinsed/Irrigated with Rinsed/Irrigated with Saline Saline Topical Anesthetic Topical Anesthetic Applied: Other: lidocaine Applied: Other: lidocaine 4% 4% Treatment Notes Electronic Signature(s) Signed: 02/04/2015 11:47:54 AM By: Regan Lemming BSN, RN Entered By: Regan Lemming on 02/04/2015 11:47:54 Philip [name] ([id number]) -------------------------------------------------------------------------------- [place] Details Patient Name: Philip [name], Philip [name]. Date of Service: 02/04/2015 11:30 AM Medical Record Number: [id number] Patient Account Number: [id number] Date of Birth/Sex: 05-Aug-1918 (81 y.o. Male) Treating RN: Baruch Gouty, RN, BSN, Velva Harman Primary Care Physician: Hortencia Pilar Other Clinician: Referring Physician: Hortencia Pilar Treating Physician/Extender: Frann Rider in Treatment: 76 Active Inactive Abuse / Safety / Falls / Self Care Management Nursing Diagnoses: Abuse or neglect; actual or potential Potential for falls Goals: Patient will remain injury free Date Initiated: 05/02/2014 Goal Status: Active Interventions: Assess fall risk on admission and as  needed Notes: Necrotic Tissue Nursing Diagnoses: Impaired tissue integrity related to necrotic/devitalized tissue Goals: Necrotic/devitalized tissue will be minimized in the wound bed Date Initiated: 05/02/2014 Goal Status: Active Interventions: Assess patient pain level pre-, during and post procedure and prior to discharge Treatment Activities: Apply topical anesthetic as ordered : 02/04/2015 Notes: Orientation to the Wound Care Program Nursing Diagnoses: Knowledge deficit related to the wound healing center program Philip [name], Philip [name] ([id number]) Goals: Patient/caregiver will verbalize understanding of the [facility] Date Initiated: 05/02/2014 Goal Status: Active Interventions: Provide education on orientation to the wound center Notes: Electronic Signature(s) Signed: 02/04/2015 11:47:45 AM By: Regan Lemming BSN, RN Entered By: Regan Lemming on 02/04/2015 11:47:45 Philip [name] ([id number]) -------------------------------------------------------------------------------- Pain Assessment Details Patient Name: Philip [name]. Date of Service: 02/04/2015 11:30 AM Medical Record Number: [id number] Patient Account Number: [id number] Date of Birth/Sex: 1918-04-27 (81 y.o. Male) Treating RN: Baruch Gouty, RN, BSN, Velva Harman Primary Care Physician: Hortencia Pilar Other Clinician: Referring Physician: Hortencia Pilar Treating Physician/Extender: Frann Rider in Treatment: 89 Active Problems Location of Pain Severity and Description of Pain Patient Has Paino No Site Locations Pain Management and Medication Current Pain Management: Electronic Signature(s) Signed: 02/04/2015 11:28:13 AM By: Regan Lemming BSN, RN Entered By: Regan Lemming on 02/04/2015 11:28:13 Kirtley,  KEYLON LABELLE ([id number]) -------------------------------------------------------------------------------- Patient/Caregiver Education Details Patient Name: Philip [name], Philip [name]. Date of Service: 02/04/2015 11:30  AM Medical Record Number: [id number] Patient Account Number: [id number] Date of Birth/Gender: [birth year] (81 y.o. Male) Treating RN: Baruch Gouty, RN, BSN, Velva Harman Primary Care Physician: Hortencia Pilar Other Clinician: Referring Physician: Hortencia Pilar Treating Physician/Extender: Frann Rider in Treatment: 69 Education Assessment Education Provided To: Patient Education Topics Provided Basic Hygiene: Methods: Explain/Verbal Responses: State content correctly Welcome To [facility]: Methods: Explain/Verbal Responses: State content correctly Electronic Signature(s) Signed: 02/04/2015 12:20:10 PM By: Regan Lemming BSN, RN Entered By: Regan Lemming on 02/04/2015 12:20:09 Philip [name] ([id number]) -------------------------------------------------------------------------------- Wound Assessment Details Patient Name: Philip [name], Philip [name]. Date of Service: 02/04/2015 11:30 AM Medical Record Number: [id number] Patient Account Number: [id number] Date of Birth/Sex: [birth year] (81 y.o. Male) Treating RN: Baruch Gouty, RN, BSN, [place] Primary Care Physician: Hortencia Pilar Other Clinician: Referring Physician: Hortencia Pilar Treating Physician/Extender: Frann Rider in Treatment: 39 Wound Status Wound Number: 4 Primary Malignant Wound Etiology: Wound Location: Left Malleolus - Medial Wound Status: Open Wounding Event: Other Lesion Comorbid Cataracts, Arrhythmia, Congestive Date Acquired: 09/20/2014 History: Heart Failure Weeks Of Treatment: 19 Clustered Wound: No Photos Photo Uploaded By: Regan Lemming on 02/04/2015 16:59:16 Wound Measurements Length: (cm) 2.6 Width: (cm) 2.4 Depth: (cm) 0.2 Area: (cm) 4.901 Volume: (cm) 0.98 % Reduction in Area: -3021.7% % Reduction in Volume: -6025% Epithelialization: Small (1-33%) Tunneling: No Undermining: No Wound Description Full Thickness With Exposed Foul Odor Aft Classification: Support Structures Wound Margin: Distinct,  outline attached Exudate Small Amount: Exudate Type: Serosanguineous Exudate Color: red, brown er Cleansing: No Wound Bed Granulation Amount: None Present (0%) Exposed Structure Necrotic Amount: Large (67-100%) Fascia Exposed: Yes Necrotic Quality: Adherent Slough Fat Layer Exposed: No Philip [name], Philip [name] ([id number]) Tendon Exposed: Yes Muscle Exposed: No Joint Exposed: No Bone Exposed: No Periwound Skin Texture Texture Color No Abnormalities Noted: No No Abnormalities Noted: No Callus: No Atrophie Blanche: No Crepitus: No Cyanosis: No Excoriation: No Ecchymosis: No Fluctuance: No Erythema: Yes Friable: No Erythema Location: Circumferential Induration: No Hemosiderin Staining: No Localized Edema: Yes Mottled: No Rash: No Pallor: No Scarring: No Rubor: No Moisture Temperature / Pain No Abnormalities Noted: No Temperature: No Abnormality Dry / Scaly: No Tenderness on Palpation: Yes Maceration: No Moist: Yes Wound Preparation Ulcer Cleansing: Rinsed/Irrigated with Saline Topical Anesthetic Applied: Other: lidocaine 4%, Treatment Notes Wound #4 (Left, Medial Malleolus) 1. Cleansed with: Clean wound with Normal Saline 2. Anesthetic Topical Lidocaine 4% cream to wound bed prior to debridement 4. Dressing Applied: Other dressing (specify in notes) 5. Secondary Dressing Applied Kerlix/Conform 7. Secured with Tape Notes Grafix applied by MD. Lillard [name] with gauze, kerlix and coban lightly from toes to 3cm under knee. SOrbact applied to left forearm with drawtex, lightly wrapped with coban. Electronic Signature(s) Signed: 02/04/2015 11:42:28 AM By: Regan Lemming BSN, RN Entered By: Regan Lemming on 02/04/2015 11:42:27 ADNAN, VANVOORHIS ([id number], Philip [name] ([id number]) -------------------------------------------------------------------------------- Wound Assessment Details Patient Name: Philip [name], Philip [name]. Date of Service: 02/04/2015 11:30 AM Medical Record  Number: [id number] Patient Account Number: [id number] Date of Birth/Sex: [birth year] (81 y.o. Male) Treating RN: Baruch Gouty, RN, BSN, Velva Harman Primary Care Physician: Hortencia Pilar Other Clinician: Referring Physician: Hortencia Pilar Treating Physician/Extender: Frann Rider in Treatment: 39 Wound Status Wound Number: 5R Primary Skin Tear Etiology: Wound Location: Right Forearm - Lateral Wound Status: Open Wounding Event: Shear/Friction Comorbid Cataracts, Arrhythmia, Congestive Date Acquired: 09/22/2014 History: Heart Failure Weeks Of Treatment: 17 Clustered Wound: No  Photos Photo Uploaded By: Regan Lemming on 02/04/2015 16:59:16 Wound Measurements Length: (cm) 9 Width: (cm) 6.5 Depth: (cm) 0.1 Area: (cm) 45.946 Volume: (cm) 4.595 % Reduction in Area: -631.3% % Reduction in Volume: -631.7% Epithelialization: Small (1-33%) Tunneling: No Undermining: No Wound Description Classification: Partial Thickness Foul Odor Aft Wound Margin: Flat and Intact Exudate Amount: Medium Exudate Type: Serosanguineous Exudate Color: red, brown er Cleansing: No Wound Bed Granulation Amount: Large (67-100%) Exposed Structure Necrotic Amount: None Present (0%) Fascia Exposed: No Fat Layer Exposed: No Tendon Exposed: No YONAH, TANGEMAN ([id number]) Muscle Exposed: No Joint Exposed: No Bone Exposed: No Limited to Skin Breakdown Periwound Skin Texture Texture Color No Abnormalities Noted: No No Abnormalities Noted: No Callus: No Atrophie Blanche: No Crepitus: No Cyanosis: No Excoriation: No Ecchymosis: No Fluctuance: No Erythema: No Friable: No Hemosiderin Staining: No Induration: No Mottled: No Localized Edema: No Pallor: No Rash: No Rubor: No Scarring: No Temperature / Pain Moisture Temperature: No Abnormality No Abnormalities Noted: No Dry / Scaly: No Maceration: Yes Moist: Yes Wound Preparation Ulcer Cleansing: Rinsed/Irrigated with Saline Topical Anesthetic  Applied: Other: lidocaine 4%, Treatment Notes Wound #5R (Right, Lateral Forearm) 1. Cleansed with: Clean wound with Normal Saline 2. Anesthetic Topical Lidocaine 4% cream to wound bed prior to debridement 4. Dressing Applied: Other dressing (specify in notes) 5. Secondary Dressing Applied Kerlix/Conform 7. Secured with Tape Notes Grafix applied by MD. Lillard [name] with gauze, kerlix and coban lightly from toes to 3cm under knee. SOrbact applied to left forearm with drawtex, lightly wrapped with coban. Electronic Signature(s) Signed: 02/04/2015 11:42:50 AM By: Regan Lemming BSN, RN Entered By: Regan Lemming on 02/04/2015 11:42:50 Philip [name], Philip [name] ([id number], Philip [name] ([id number]) -------------------------------------------------------------------------------- Vitals Details Patient Name: Philip [name], Philip [name]. Date of Service: 02/04/2015 11:30 AM Medical Record Number: [id number] Patient Account Number: [id number] Date of Birth/Sex: November 22, 1918 (81 y.o. Male) Treating RN: Baruch Gouty, RN, BSN, [place] Primary Care Physician: Hortencia Pilar Other Clinician: Referring Physician: Hortencia Pilar Treating Physician/Extender: Frann Rider in Treatment: 75 Vital Signs Time Taken: 11:29 Temperature (F): 97.6 Height (in): 69 Pulse (bpm): 71 Weight (lbs): 179 Respiratory Rate (breaths/min): 16 Body Mass Index (BMI): 26.4 Blood Pressure (mmHg): 115/80 Reference Range: 80 - 120 mg / dl Electronic Signature(s) Signed: 02/04/2015 11:32:19 AM By: Regan Lemming BSN, RN Entered By: Regan Lemming on 02/04/2015 11:32:19

## 2015-02-05 NOTE — Progress Notes (Signed)
Philip [name] ([id number]) Visit Report for 02/04/2015 Chief Complaint Document Details Patient Name: Philip [name], Philip [name]. Date of Service: 02/04/2015 11:30 AM Medical Record Number: [id number] Patient Account Number: [id number] Date of Birth/Sex: [birth year] (81 y.o. Male) Treating RN: Baruch Gouty, RN, BSN, Velva Harman Primary Care Physician: Hortencia Pilar Other Clinician: Referring Physician: Hortencia Pilar Treating Physician/Extender: Frann Rider in Treatment: 90 Information Obtained from: Patient Chief Complaint R foot ulcer. L forearm ulcer. 07/19/2014 -- about 2 weeks ago he had a surgical procedure done by dermatologist in [place] and has an open surgical wound on the dorsum of the right foot. Electronic Signature(s) Signed: 02/04/2015 12:27:23 PM By: Christin Fudge MD, FACS Entered By: Christin Fudge on 02/04/2015 12:27:23 Philip [name] ([id number]) -------------------------------------------------------------------------------- Cellular or Tissue Based Product Details Patient Name: [name], Philip. Date of Service: 02/04/2015 11:30 AM Medical Record Number: [id number] Patient Account Number: [id number] Date of Birth/Sex: [birth year] (81 y.o. Male) Treating RN: Baruch Gouty, RN, BSN, Velva Harman Primary Care Physician: Hortencia Pilar Other Clinician: Referring Physician: Hortencia Pilar Treating Physician/Extender: Frann Rider in Treatment: 39 Cellular or Tissue Based Wound #4 Left,Medial Malleolus Product Type Applied to: Performed By: Physician Christin Fudge, MD Cellular or Tissue Based Grafix prime Product Type: Time-Out Taken: Yes Location: genitalia / hands / feet / multiple digits Wound Size (sq cm): 6.24 Product Size (sq cm): 12 Waste Size (sq cm): 0 Amount of Product Applied (sq cm): 12 Lot #: [id number] Expiration Date: 11/13/2016 Fenestrated: No Reconstituted: Yes Solution Type: saline Solution Amount: 76m Lot #: [id number] Solution Expiration 08/17/2016 Date: Secured:  Yes Secured With: Steri-Strips Dressing Applied: Yes Primary Dressing: mepitel Procedural Pain: 0 Post Procedural Pain: 0 Response to Treatment: Procedure was tolerated well Post Procedure Diagnosis Same as Pre-procedure Electronic Signature(s) Signed: 02/04/2015 12:27:17 PM By: Christin Fudge MD, FACS Previous Signature: 02/04/2015 12:18:29 PM Version By: Regan Lemming BSN, RN Entered By: Christin Fudge on 02/04/2015 12:27:16 Philip [name] ([id number]) -------------------------------------------------------------------------------- HPI Details Patient Name: Philip [name], Philip [name]. Date of Service: 02/04/2015 11:30 AM Medical Record Number: [id number] Patient Account Number: [id number] Date of Birth/Sex: 26-Nov-1918 (81 y.o. Male) Treating RN: Afful, RN, BSN, [place] Primary Care Physician: Hortencia Pilar Other Clinician: Referring Physician: Hortencia Pilar Treating Physician/Extender: Frann Rider in Treatment: 79 History of Present Illness Location: right leg Duration: Dec 2015 Modifying Factors: history of an injury to the right leg with resulting hematoma and thrombophlebitis and later an ulcer of posterior leg Associated Signs and Symptoms: marked lymphedema of the right leg. He is already on Eloquis. HPI Description: 06/14/14 -- He returns for followup today. He denies any fevers. no fresh issues and his daughter says he's been doing fine. 06/21/14 -- after he sustained a fall this week earlier he applied a bandage over this himself and did not seek any medical attention. he did however manage to control the bleeding and had a dressing in place the next morning when his son to the visit. In this dressing was removed there was further damaged skin. His right leg has been doing fine otherwise. 07/12/14 --Very pleasant 81 year old with past medical history significant for congestive heart failure (EF 15%), peripheral vascular disease, and chronic kidney disease. He was hospitalized at  [facility] in December 2015 for congestive heart failure. He says that he fell during his hospital course and developed a hematoma over his right calf. He was also diagnosed with a right lower extremity DVT for which he takes Eliquis. The hematoma subsequently turned into an ulceration around Christmas, which has  healed. He subsequently developed an ulcer on his right dorsal foot and a traumatic left forearm ulcer. Per his report, he underwent biopsy of the right dorsal foot ulceration which demonstrated a skin cancer. His PCP and dermatologist office are both closed today. I reviewed his records in [place] but find no report of biopsy or pathology. He s without complaints today. No significant pain. No fever or chills. Minimal drainage. 07/19/2014 - the patient and his son tell me that about 2 weeks ago the dermatologist did a skin biopsy and this was a large area on the dorsum of his right foot which was left open and no dressing instructions were recommended. Since then he has been called and told that it is a cancer and the need to do a further procedure but that will not happen until about 2 weeks from now. In the meanwhile the patient has not been taking care of his right foot. The left forearm where he had an abrasion and laceration is doing very well. 07/26/2014 -- Reports from 07/08/2014 from the dermatology group reviewed. A excision was done of a lesion located on the dorsum of the right foot and this was 1.7 cm in diameter which was a shave biopsy performed. The wound was left open after appropriate cauterization and the patient was given this dressing instructions. 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 08/02/2014 -- all the original problems he came with have completely resolved. He now has a surgical wound on his right foot dorsum where a skin cancer was excised. He goes to see his dermatologist  this Philip [name], Philip [name] ([id number]) coming Tuesday and will have definite news next Friday. 08/09/2014 he had gone to his dermatologist on Tuesday and she has injected the base of his ulcer with some chemotherapeutic agent. He was supposed to bring some papers with him but forgot to get them and will bring them in next week. Other than that the dermatologist had suggested using Mehdi honey on the wound. 08/16/2014 -- the patient has brought in his notes from the dermatologist and on 08/06/2014 he received a injection of 5 FU, 500 mg grams into the lesion. the pathology report was also sent and it was a squamous cell carcinoma well-differentiated and edges were involved. They wanted him to use many honey for the wound dressing changes to be done 3 times a week. 08/30/2014 -- he has finished his second injection of 5-FU and has the next one in 2 weeks' time. He is doing fine otherwise. 09/13/2014 - No new complaints. No significant pain. No fever or chills. Minimal drainage. Still receiving 5-FU injections. 09/20/2014 -- He was seen by the dermatologist on 09/10/2014 and Dr. Phillip Heal injected his foot both the right on the dorsum and left near the medial malleolus with 5-FU. The next dose of 5-FU is to be given after 3 months. The patient says he now has a spot on the left medial malleolus where he was injected with 5-FU. 09/27/2014 -- the area on the left ankle where he was injected with 5-FU is now a full-blown ulcer. He also has mild pain in both ankle areas. 10/04/2014 - large had a bit of a fall and injured his right arm last evening and has had a laceration with no evidence of any foreign body in the right forearm. 12/20/2014 -- he recently saw his dermatologist at [facility] and she was pleased with his wound healing on  the right lower extremity. She has not given him any further 5-FU injections and will see him back on a when necessary basis. 01/03/2015 -- his skin substitute Grafix has been  approved by his insurance and we will go ahead with this next week. 01/10/2015 -- he has his first application of Grafix today. he recently had a nightmare and injured his right forearm and had some abrasions. 01/13/2015 -- Iona Beard has developed significant swelling of the left calf with some tenderness of the calf. This was only noticed this morning. Addendum: The DVT study done in the hospital -- IMPRESSION:No evidence of deep venous thrombosis left lower extremity. The result was called into the patient's son who acknowledges the report and will bring the patient back on Friday 01/17/2015 -- he saw his PCP Dr. Hoy Morn and she has increases dosage of Lasix and his edema on the left lower extremity is looking much better. He is here for a second applications of Grafix 32/95/1884 -- he is overall doing very well his edema has come down significantly. He is here for his third application of grafix. 02/04/2015 -- he has no new issues and is here for his fourth application of grafix Electronic Signature(s) Signed: 02/04/2015 12:27:53 PM By: Christin Fudge MD, FACS Philip [name], Philip [name] ([id number]) Entered By: Christin Fudge on 02/04/2015 12:27:53 Philip [name], Philip [name] ([id number]) -------------------------------------------------------------------------------- Physical Exam Details Patient Name: MEL, LANGAN. Date of Service: 02/04/2015 11:30 AM Medical Record Number: [id number] Patient Account Number: [id number] Date of Birth/Sex: Apr 07, 1919 (81 y.o. Male) Treating RN: Baruch Gouty, RN, BSN, Velva Harman Primary Care Physician: Hortencia Pilar Other Clinician: Referring Physician: Hortencia Pilar Treating Physician/Extender: Frann Rider in Treatment: 39 Constitutional . Pulse regular. Respirations normal and unlabored. Afebrile. . Eyes Nonicteric. Reactive to light. Ears, Nose, Mouth, and Throat Lips, teeth, and gums WNL.Marland Kitchen Moist mucosa without lesions . Neck supple and nontender. No palpable  supraclavicular or cervical adenopathy. Normal sized without goiter. Respiratory WNL. No retractions.. Cardiovascular Pedal Pulses WNL. No clubbing, cyanosis or edema. Lymphatic No adneopathy. No adenopathy. No adenopathy. Musculoskeletal Adexa without tenderness or enlargement.. Digits and nails w/o clubbing, cyanosis, infection, petechiae, ischemia, or inflammatory conditions.. Integumentary (Hair, Skin) No suspicious lesions. No crepitus or fluctuance. No peri-wound warmth or erythema. No masses.Marland Kitchen Psychiatric Judgement and insight Intact.. No evidence of depression, anxiety, or agitation.. Notes The right arm looks fairly clean and there are no new ulcerations. The left medial ankle has some debris which will be sharply dissected and the wound bed prepared for his next application of grafix. Electronic Signature(s) Signed: 02/04/2015 12:29:00 PM By: Christin Fudge MD, FACS Entered By: Christin Fudge on 02/04/2015 12:29:00 Philip [name] ([id number]) -------------------------------------------------------------------------------- Physician Orders Details Patient Name: Philip [name], Philip [name]. Date of Service: 02/04/2015 11:30 AM Medical Record Number: [id number] Patient Account Number: [id number] Date of Birth/Sex: 1918/04/26 (81 y.o. Male) Treating RN: Baruch Gouty, RN, BSN, Velva Harman Primary Care Physician: Hortencia Pilar Other Clinician: Referring Physician: Hortencia Pilar Treating Physician/Extender: Frann Rider in Treatment: 13 Verbal / Phone Orders: Yes Clinician: Afful, RN, BSN, Rita Read Back and Verified: Yes Diagnosis Coding Anesthetic Wound #4 Left,Medial Malleolus o Topical Lidocaine 4% cream applied to wound bed prior to debridement Skin Barriers/Peri-Wound Care Wound #4 Left,Medial Malleolus o Skin Prep Wound #5R Right,Lateral Forearm o Skin Prep Primary Wound Dressing Wound #4 Left,Medial Malleolus o Other: - grAFIX Wound #5R Right,Lateral Forearm o  Drawtex o Other: - Sorbact Secondary Dressing Wound #4 Left,Medial Malleolus o Gauze and Kerlix/Conform Wound #5R Right,Lateral Forearm o Gauze  and Kerlix/Conform Dressing Change Frequency Wound #4 Left,Medial Malleolus o Change dressing every week Wound #5R Right,Lateral Forearm o Change dressing every week Follow-up Appointments Wound #4 Left,Medial Malleolus o Return Appointment in: - in 10 days Philip [name], Philip [name]. ([id number]) Wound #5R Right,Lateral Forearm o Return Appointment in: - in 10 days Edema Control Wound #4 Left,Medial Malleolus o 2 Layer Compression System - Left Lower Extremity Advanced Therapies Wound #4 Left,Medial Malleolus o Grafix Prime application in clinic; including contact layer, fixation with steri strips, dry gauze and cover dressing. Electronic Signature(s) Signed: 02/04/2015 12:15:55 PM By: Regan Lemming BSN, RN Signed: 02/04/2015 4:16:00 PM By: Christin Fudge MD, FACS Previous Signature: 02/04/2015 11:55:56 AM Version By: Regan Lemming BSN, RN Entered By: Regan Lemming on 02/04/2015 12:15:54 Philip [name] ([id number]) -------------------------------------------------------------------------------- Problem List Details Patient Name: Philip [name], Philip [name]. Date of Service: 02/04/2015 11:30 AM Medical Record Number: [id number] Patient Account Number: [id number] Date of Birth/Sex: 05-17-1918 (81 y.o. Male) Treating RN: Baruch Gouty, RN, BSN, Velva Harman Primary Care Physician: Hortencia Pilar Other Clinician: Referring Physician: Hortencia Pilar Treating Physician/Extender: Frann Rider in Treatment: 53 Active Problems ICD-10 Encounter Code Description Active Date Diagnosis I70.232 Atherosclerosis of native arteries of right leg with 06/21/2014 Yes ulceration of calf I82.401 Acute embolism and thrombosis of unspecified deep veins 06/21/2014 Yes of right lower extremity S91.301A Unspecified open wound, right foot, initial encounter 07/19/2014  Yes Z92.21 Personal history of antineoplastic chemotherapy 08/23/2014 Yes L97.522 Non-pressure chronic ulcer of other part of left foot with fat 09/20/2014 Yes layer exposed S41.111A Laceration without foreign body of right upper arm, initial 10/04/2014 Yes encounter Inactive Problems Resolved Problems ICD-10 Code Description Active Date Resolved Date L97.212 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 Date of Service: 02/04/2015 11:30 AM Medical Record Number: [id number] Patient Account Number: [id number] Date of Birth/Sex: [birth year] (81 y.o. Male) Treating RN: Baruch Gouty, RN, BSN, Velva Harman Primary Care Physician: Hortencia Pilar Other Clinician: Referring Physician: Hortencia Pilar Treating Physician/Extender: Frann Rider in Treatment: 70 Subjective Chief Complaint Information obtained from Patient R foot ulcer. L forearm ulcer. 07/19/2014 -- about 2 weeks ago he had a surgical procedure done by dermatologist in [place] and has an open surgical wound on the dorsum of the right foot. History of Present Illness (HPI) The following HPI elements were documented for the patient's wound: Location: right leg Duration: Dec 2015 Modifying Factors: history of an injury to the right leg with resulting hematoma and thrombophlebitis and later an ulcer of posterior leg Associated Signs and Symptoms: marked lymphedema of the right leg. He is already on Eloquis. 06/14/14 --  He returns for followup today. He denies any fevers. no fresh issues and his daughter says he's been doing fine. 06/21/14 -- after he sustained a fall this week earlier he applied a bandage over this himself and did not seek any medical attention. he did however manage to control the bleeding and had a dressing in place the next morning when his son to the visit. In this dressing was removed there was further damaged skin. His right leg has been doing fine otherwise. 07/12/14 --Very pleasant 81 year old with past medical history significant  for congestive heart failure (EF 15%), peripheral vascular disease, and chronic kidney disease. He was hospitalized at [facility] in December 2015 for congestive heart failure. He says that he fell during his hospital course and developed a hematoma over his right calf. He was also diagnosed with a right lower extremity DVT for which he takes Eliquis. The hematoma subsequently turned into an ulceration around Christmas, which has healed. He subsequently developed an ulcer on his right dorsal foot and a traumatic left forearm ulcer. Per his report, he underwent biopsy of the right dorsal foot ulceration which demonstrated a skin cancer. His PCP and dermatologist office are both closed today. I reviewed his records in [place] but find no report of biopsy or pathology. He s without complaints today. No significant pain. No fever or chills. Minimal drainage. 07/19/2014 - the patient and his son tell me that about 2 weeks ago the dermatologist did a skin biopsy and this was a large area on the dorsum of his right foot which was left open and no dressing instructions were recommended. Since then he has been called and told that it is a cancer and the need to do a further procedure but that will not happen until about 2 weeks from now. In the meanwhile the patient has not been taking care of his right foot. The left forearm where he had an abrasion and laceration is doing very  well. DJIBRIL, Philip [name] ([id number]) 07/26/2014 -- Reports from 07/08/2014 from the dermatology group reviewed. A excision was done of a lesion located on the dorsum of the right foot and this was 1.7 cm in diameter which was a shave biopsy performed. The wound was left open after appropriate cauterization and the patient was given this dressing instructions. 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 08/02/2014 -- all the original problems he came with have completely resolved. He now has a surgical wound on his right foot dorsum where a skin cancer was excised. He goes to see his dermatologist this coming Tuesday and will have definite news next Friday. 08/09/2014 he had gone to his dermatologist on Tuesday and she has injected the base of his ulcer with some chemotherapeutic agent. He was supposed to bring some papers with him but forgot to get them and will bring them in next week. Other than that the dermatologist had suggested using Mehdi honey on the wound. 08/16/2014 -- the patient has brought in his notes from the dermatologist and on 08/06/2014 he received a injection of 5 FU, 500 mg grams into the lesion. the pathology report was also sent and it was a squamous cell carcinoma well-differentiated and edges were involved. They wanted him to use many honey for the wound dressing changes to be done 3 times a week. 08/30/2014 -- he has finished his second injection of 5-FU and has the next one in 2 weeks' time. He is doing fine otherwise. 09/13/2014 - No new complaints. No significant pain. No fever or chills. Minimal drainage. Still receiving 5-FU injections. 09/20/2014 -- He was seen by the dermatologist on 09/10/2014 and Dr. Phillip Heal injected his foot both the right on the dorsum and left near the medial malleolus with 5-FU. The next dose of 5-FU is to be given after 3 months. The patient says he  now has a spot on the left medial malleolus where he was injected with 5-FU. 09/27/2014 -- the area on the left ankle where  he was injected with 5-FU is now a full-blown ulcer. He also has mild pain in both ankle areas. 10/04/2014 - large had a bit of a fall and injured his right arm last evening and has had a laceration with no evidence of any foreign body in the right forearm. 12/20/2014 -- he recently saw his dermatologist at [facility] and she was pleased with his wound healing on the right lower extremity. She has not given him any further 5-FU injections and will see him back on a when necessary basis. 01/03/2015 -- his skin substitute Grafix has been approved by his insurance and we will go ahead with this next week. 01/10/2015 -- he has his first application of Grafix today. he recently had a nightmare and injured his right forearm and had some abrasions. 01/13/2015 -- Iona Beard has developed significant swelling of the left calf with some tenderness of the calf. This was only noticed this morning. Addendum: The DVT study done in the hospital -- IMPRESSION:No evidence of deep venous thrombosis left lower extremity. The result was called into the patient's son who acknowledges the report and will bring the patient back on Friday Philip [name], Philip [name] ([id number]) 01/17/2015 -- he saw his PCP Dr. Hoy Morn and she has increases dosage of Lasix and his edema on the left lower extremity is looking much better. He is here for a second applications of Grafix 24/58/0998 -- he is overall doing very well his edema has come down significantly. He is here for his third application of grafix. 02/04/2015 -- he has no new issues and is here for his fourth application of grafix Objective Constitutional Pulse regular. Respirations normal and unlabored. Afebrile. Vitals Time Taken: 11:29 AM, Height: 69 in, Weight: 179 lbs, BMI: 26.4, Temperature: 97.6 F, Pulse: 71 bpm, Respiratory Rate: 16 breaths/min, Blood  Pressure: 115/80 mmHg. Eyes Nonicteric. Reactive to light. Ears, Nose, Mouth, and Throat Lips, teeth, and gums WNL.Marland Kitchen Moist mucosa without lesions . Neck supple and nontender. No palpable supraclavicular or cervical adenopathy. Normal sized without goiter. Respiratory WNL. No retractions.. Cardiovascular Pedal Pulses WNL. No clubbing, cyanosis or edema. Lymphatic No adneopathy. No adenopathy. No adenopathy. Musculoskeletal Adexa without tenderness or enlargement.. Digits and nails w/o clubbing, cyanosis, infection, petechiae, ischemia, or inflammatory conditions.Marland Kitchen Psychiatric Judgement and insight Intact.. No evidence of depression, anxiety, or agitation.. General Notes: The right arm looks fairly clean and there are no new ulcerations. The left medial ankle has some debris which will be sharply dissected and the wound bed prepared for his next application of grafix. Integumentary (Hair, Skin) Philip [name], Philip [name]. ([id number]) No suspicious lesions. No crepitus or fluctuance. No peri-wound warmth or erythema. No masses.. Wound #4 status is Open. Original cause of wound was Other Lesion. The wound is located on the Left,Medial Malleolus. The wound measures 2.6cm length x 2.4cm width x 0.2cm depth; 4.901cm^2 area and 0.98cm^3 volume. There is tendon and fascia exposed. There is no tunneling or undermining noted. There is a small amount of serosanguineous drainage noted. The wound margin is distinct with the outline attached to the wound base. There is no granulation within the wound bed. There is a large (67-100%) amount of necrotic tissue within the wound bed including Adherent Slough. The periwound skin appearance exhibited: Localized Edema, Moist, Erythema. The periwound skin appearance did not exhibit: Callus, Crepitus, Excoriation, Fluctuance, Friable, Induration, Rash, Scarring, Dry/Scaly, Maceration, Atrophie Blanche, Cyanosis, Ecchymosis, Hemosiderin Staining, Mottled, Pallor, Rubor.  The surrounding wound skin color is noted with erythema which is circumferential. Periwound  temperature was noted as No Abnormality. The periwound has tenderness on palpation. Wound #5R status is Open. Original cause of wound was Shear/Friction. The wound is located on the Right,Lateral Forearm. The wound measures 9cm length x 6.5cm width x 0.1cm depth; 45.946cm^2 area and 4.595cm^3 volume. The wound is limited to skin breakdown. There is no tunneling or undermining noted. There is a medium amount of serosanguineous drainage noted. The wound margin is flat and intact. There is large (67-100%) granulation within the wound bed. There is no necrotic tissue within the wound bed. The periwound skin appearance exhibited: Maceration, Moist. The periwound skin appearance did not exhibit: Callus, Crepitus, Excoriation, Fluctuance, Friable, Induration, Localized Edema, Rash, Scarring, Dry/Scaly, Atrophie Blanche, Cyanosis, Ecchymosis, Hemosiderin Staining, Mottled, Pallor, Rubor, Erythema. Periwound temperature was noted as No Abnormality. 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 A appropriate dressing was applied in place and he would be seen back next week for a wound check. The right arm will be dressed with sort of backed and a piece of DrawTex and wrapped in for a week. 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 A skin graft procedure using a bioengineered skin substitute/cellular or tissue based product was performed by Christin Fudge, MD. Grafix prime was applied and secured with Steri-Strips. 12 sq cm of product was utilized and 0 sq cm was wasted. Post Application, mepitel was applied. A Time Out was conducted prior to the start of the procedure. The procedure was tolerated well with a pain level of 0 throughout and a pain level of 0 following the procedure. Post procedure Diagnosis Wound #4: Same as Pre-Procedure . Plan Anesthetic: Wound #4 Left,Medial Malleolus: Topical Lidocaine 4% cream applied to wound bed prior to debridement Skin Barriers/Peri-Wound Care: Wound #4 Left,Medial Malleolus: Skin Prep Wound #5R Right,Lateral Forearm: Skin Prep Primary Wound Dressing: Wound #4 Left,Medial Malleolus: Other: - grAFIX Wound #5R Right,Lateral Forearm: Drawtex Other: - Sorbact Secondary Dressing: Wound #4 Left,Medial Malleolus: Gauze and Kerlix/Conform Wound #5R Right,Lateral Forearm: Gauze and Kerlix/Conform Dressing Change Frequency: Wound #4 Left,Medial Malleolus: Change dressing every week Wound #5R Right,Lateral Forearm: Change dressing every week Follow-up Appointments: Wound #4 Left,Medial Malleolus: Return Appointment in: - in 10 days Wound #5R Right,Lateral Forearm: Return Appointment in: - in 10 days Edema Control: Wound #4 Left,Medial Malleolus: 2 Layer Compression System - Left Lower Extremity LAMARIUS, DIRR ([id number]) Advanced Therapies: Wound #4 Left,Medial Malleolus: Grafix Prime application in clinic; including contact layer, fixation with steri strips, dry gauze and cover dressing. The wound bed was prepared appropriately on the left ankle and the next layer of Graphix was applied in the usual fashion with a bolster and Steri-Strips. A appropriate dressing  was applied in place and he would be seen back next week for a wound check. The right arm will be dressed with sort of backed and a piece of DrawTex and wrapped in for  a week. Electronic Signature(s) Signed: 02/04/2015 12:31:04 PM By: Christin Fudge MD, FACS Entered By: Christin Fudge on 02/04/2015 12:31:04 Philip [name] ([id number]) -------------------------------------------------------------------------------- [place] Details Patient Name: EDREI, NORGAARD. Date of Service: 02/04/2015 Medical Record Number: [id number] Patient Account Number: [id number] Date of Birth/Sex: March 10, 1919 (81 y.o. 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 S41.111A Laceration without foreign body of right upper arm, initial encounter Facility Procedures The patient participates with Medicare or their insurance follows the Medicare Facility Guidelines: CPT4 Code Description Modifier Quantity [id number] (Facility Use Only) Grafix Prime 1 SQ CM 12 The patient participates with Medicare or their insurance follows the Medicare Facility Guidelines: [id number] - SKIN SUB GRAFT FACE/NK/HF/G 1 ICD-10 Description Diagnosis L97.522 Non-pressure chronic ulcer of other part of left foot with fat layer  exposed Z92.21 Personal history of antineoplastic chemotherapy Physician Procedures CPT4 Code Description: [id number] - WC PHYS SKIN SUB GRAFT  FACE/NK/HF/G ICD-10 Description Diagnosis L97.522 Non-pressure chronic ulcer of other part of left foot with Z92.21 Personal history of antineoplastic chemotherapy Modifier: fat layer e Quantity: 1 xposed Electronic Signature(s) Signed: 02/04/2015 12:31:27 PM By: Christin Fudge MD, FACS Entered By: Christin Fudge on 02/04/2015 12:31:27

## 2015-02-07 ENCOUNTER — Encounter: Payer: Medicare Other | Admitting: General Surgery

## 2015-02-14 ENCOUNTER — Encounter: Payer: Medicare Other | Admitting: Surgery

## 2015-02-14 DIAGNOSIS — L97522 Non-pressure chronic ulcer of other part of left foot with fat layer exposed: Secondary | ICD-10-CM | POA: Diagnosis not present

## 2015-02-14 NOTE — Progress Notes (Addendum)
CAS, TRACZ ([id number]) Visit Report for 02/14/2015 Chief Complaint Document Details Patient Name: Philip [name], Philip [name]. Date of Service: 02/14/2015 10:00 AM Medical Record Number: [id number] Patient Account Number: [id number] Date of Birth/Sex: Nov 16, 1918 (81 y.o. Male) Treating RN: Cornell Barman Primary Care Physician: Hortencia Pilar Other Clinician: Referring Physician: Hortencia Pilar Treating Physician/Extender: Frann Rider in Treatment: 30 Information Obtained from: Patient Chief Complaint R foot ulcer. L forearm ulcer. 07/19/2014 -- about 2 weeks ago he had a surgical procedure done by dermatologist in [place] and has an open surgical wound on the dorsum of the right foot. Electronic Signature(s) Signed: 02/14/2015 10:10:09 AM By: Christin Fudge MD, FACS Entered By: Christin Fudge on 02/14/2015 10:10:09 [name] ([id number]) -------------------------------------------------------------------------------- Debridement Details Patient Name: Philip [name]. Date of Service: 02/14/2015 10:00 AM Medical Record Number: [id number] Patient Account Number: [id number] Date of Birth/Sex: June 16, 1918 (81 y.o. Male) Treating RN: Cornell Barman Primary Care Physician: Hortencia Pilar Other Clinician: Referring Physician: Hortencia Pilar Treating Physician/Extender: Frann Rider in Treatment: 64 Debridement Performed for Wound #5R Right,Lateral Forearm Assessment: Performed By: Physician Christin Fudge, MD Debridement: Debridement Pre-procedure Yes Verification/Time Out Taken: Start Time: 10:12 Pain Control: Other : lidiocaine 4%$ Level: Skin/Subcutaneous Tissue Total Area Debrided (L x 5 (cm) x 2 (cm) = 10 (cm) W): Tissue and other Viable, Non-Viable, Eschar, Exudate, Fibrin/Slough, Subcutaneous material debrided: Instrument: Forceps Bleeding: Minimum Hemostasis Achieved: Pressure End Time: 10:17 Procedural Pain: 0 Post Procedural Pain: 0 Response to Treatment:  Procedure was tolerated well Post Debridement Measurements of Total Wound Length: (cm) 5 Width: (cm) 2 Depth: (cm) 0.1 Volume: (cm) 0.785 Post Procedure Diagnosis Same as Pre-procedure Electronic Signature(s) Signed: 02/14/2015 3:54:28 PM By: Gretta Cool RN, BSN, Kim RN, BSN Signed: 02/14/2015 4:17:13 PM By: Christin Fudge MD, FACS Entered By: Gretta Cool RN, BSN, Kim on 02/14/2015 10:16:38 [name] ([id number]) -------------------------------------------------------------------------------- HPI Details Patient Name: Philip [name]. Date of Service: 02/14/2015 10:00 AM Medical Record Number: [id number] Patient Account Number: [id number] Date of Birth/Sex: 1918-06-09 (81 y.o. Male) Treating RN: Cornell Barman Primary Care Physician: Hortencia Pilar Other Clinician: Referring Physician: Hortencia Pilar Treating Physician/Extender: Frann Rider in Treatment: 4 History of Present Illness Location: right leg Duration: Dec 2015 Modifying Factors: history of an injury to the right leg with resulting hematoma and thrombophlebitis and later an ulcer of posterior leg Associated Signs and Symptoms: marked lymphedema of the right leg. He is already on Eloquis. HPI Description: 06/14/14 -- He returns for followup today. He denies any fevers. no fresh issues and his daughter says he's been doing fine. 06/21/14 -- after he sustained a fall this week earlier he applied a bandage over this himself and did not seek any medical attention. he did however manage to control the bleeding and had a dressing in place the next morning when his son to the visit. In this dressing was removed there was further damaged skin. His right leg has been doing fine otherwise. 07/12/14 --Very pleasant 81 year old with past medical history significant for congestive heart failure (EF 15%), peripheral vascular disease, and chronic kidney disease. He was hospitalized at [facility] in December 2015 for congestive heart failure.  He says that he fell during his hospital course and developed a hematoma over his right calf. He was also diagnosed with a right lower extremity DVT for which he takes Eliquis. The hematoma subsequently turned into an ulceration around Christmas, which has healed. He subsequently developed an ulcer on his right dorsal foot and a traumatic left forearm  ulcer. Per his report, he underwent biopsy of the right dorsal foot ulceration which demonstrated a skin cancer. His PCP and dermatologist office are both closed today. I reviewed his records in [place] but find no report of biopsy or pathology. He s without complaints today. No significant pain. No fever or chills. Minimal drainage. 07/19/2014 - the patient and his son tell me that about 2 weeks ago the dermatologist did a skin biopsy and this was a large area on the dorsum of his right foot which was left open and no dressing instructions were recommended. Since then he has been called and told that it is a cancer and the need to do a further procedure but that will not happen until about 2 weeks from now. In the meanwhile the patient has not been taking care of his right foot. The left forearm where he had an abrasion and laceration is doing very well. 07/26/2014 -- Reports from 07/08/2014 from the dermatology group reviewed. A excision was done of a lesion located on the dorsum of the right foot and this was 1.7 cm in diameter which was a shave biopsy performed. The wound was left open after appropriate cauterization and the patient was given this dressing instructions. 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 08/02/2014 -- all the original problems he came with have completely resolved. He now has a surgical wound on his right foot dorsum where a skin cancer was excised. He goes to see his dermatologist this Philip [name] ([id number]) coming Tuesday  and will have definite news next Friday. 08/09/2014 he had gone to his dermatologist on Tuesday and she has injected the base of his ulcer with some chemotherapeutic agent. He was supposed to bring some papers with him but forgot to get them and will bring them in next week. Other than that the dermatologist had suggested using Mehdi honey on the wound. 08/16/2014 -- the patient has brought in his notes from the dermatologist and on 08/06/2014 he received a injection of 5 FU, 500 mg grams into the lesion. the pathology report was also sent and it was a squamous cell carcinoma well-differentiated and edges were involved. They wanted him to use many honey for the wound dressing changes to be done 3 times a week. 08/30/2014 -- he has finished his second injection of 5-FU and has the next one in 2 weeks' time. He is doing fine otherwise. 09/13/2014 - No new complaints. No significant pain. No fever or chills. Minimal drainage. Still receiving 5-FU injections. 09/20/2014 -- He was seen by the dermatologist on 09/10/2014 and Dr. Phillip Heal injected his foot both the right on the dorsum and left near the medial malleolus with 5-FU. The next dose of 5-FU is to be given after 3 months. The patient says he now has a spot on the left medial malleolus where he was injected with 5-FU. 09/27/2014 -- the area on the left ankle where he was injected with 5-FU is now a full-blown ulcer. He also has mild pain in both ankle areas. 10/04/2014 - large had a bit of a fall and injured his right arm last evening and has had a laceration with no evidence of any foreign body in the right forearm. 12/20/2014 -- he recently saw his dermatologist at [facility] and she was pleased with his wound healing on the right lower extremity. She has not given him any further 5-FU injections and will see  him back on a when necessary basis. 01/03/2015 -- his skin substitute Grafix has been approved by his insurance and we will go ahead with  this next week. 01/10/2015 -- he has his first application of Grafix today. he recently had a nightmare and injured his right forearm and had some abrasions. 01/13/2015 -- Iona Beard has developed significant swelling of the left calf with some tenderness of the calf. This was only noticed this morning. Addendum: The DVT study done in the hospital -- IMPRESSION:No evidence of deep venous thrombosis left lower extremity. The result was called into the patient's son who acknowledges the report and will bring the patient back on Friday 01/17/2015 -- he saw his PCP Dr. Hoy Morn and she has increases dosage of Lasix and his edema on the left lower extremity is looking much better. He is here for a second applications of Grafix 22/29/7989 -- he is overall doing very well his edema has come down significantly. He is here for his third application of grafix. 02/04/2015 -- he has no new issues and is here for his fourth application of grafix 21/19/4174 -- he is here for a wound review today and has no fresh issues. Electronic Signature(s) CAL, GINDLESPERGER ([id number]) Signed: 02/14/2015 10:10:48 AM By: Christin Fudge MD, FACS Entered By: Christin Fudge on 02/14/2015 10:10:48 CHRISHAUN, SASSO ([id number]) -------------------------------------------------------------------------------- Physical Exam Details Patient Name: TABIUS, ROOD. Date of Service: 02/14/2015 10:00 AM Medical Record Number: [id number] Patient Account Number: [id number] Date of Birth/Sex: Jun 07, 1918 (81 y.o. Male) Treating RN: Cornell Barman Primary Care Physician: Hortencia Pilar Other Clinician: Referring Physician: Hortencia Pilar Treating Physician/Extender: Frann Rider in Treatment: 32 Constitutional . Pulse regular. Respirations normal and unlabored. Afebrile. . Eyes Nonicteric. Reactive to light. Ears, Nose, Mouth, and Throat Lips, teeth, and gums WNL.Marland Kitchen Moist mucosa without lesions . Neck supple and nontender. No  palpable supraclavicular or cervical adenopathy. Normal sized without goiter. Respiratory WNL. No retractions.. Cardiovascular Pedal Pulses WNL. No clubbing, cyanosis or edema. Lymphatic No adneopathy. No adenopathy. No adenopathy. Musculoskeletal Adexa without tenderness or enlargement.. Digits and nails w/o clubbing, cyanosis, infection, petechiae, ischemia, or inflammatory conditions.. Integumentary (Hair, Skin) No suspicious lesions. No crepitus or fluctuance. No peri-wound warmth or erythema. No masses.Marland Kitchen Psychiatric Judgement and insight Intact.. No evidence of depression, anxiety, or agitation.. Notes the right arm looks very good and has healed well. The left medial ankle has significant amount of debris which will be sharply debrided with a curette. Electronic Signature(s) Signed: 02/14/2015 10:30:26 AM By: Christin Fudge MD, FACS Entered By: Christin Fudge on 02/14/2015 10:30:24 [name] ([id number]) -------------------------------------------------------------------------------- Physician Orders Details Patient Name: DONIE, LEMELIN. Date of Service: 02/14/2015 10:00 AM Medical Record Number: [id number] Patient Account Number: [id number] Date of Birth/Sex: 1918-10-28 (81 y.o. Male) Treating RN: Cornell Barman Primary Care Physician: Hortencia Pilar Other Clinician: Referring Physician: Hortencia Pilar Treating Physician/Extender: Frann Rider in Treatment: 57 Verbal / Phone Orders: Yes Clinician: Cornell Barman Read Back and Verified: Yes Diagnosis Coding ICD-10 Coding Code Description [phone] 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 I82.401 Acute embolism and thrombosis of unspecified deep veins of right lower extremity S91.301A Unspecified open wound, right foot, initial encounter Z92.21 Personal history of antineoplastic chemotherapy L97.522 Non-pressure chronic ulcer of other part of left foot with fat layer exposed S41.111A  Laceration without foreign body of right upper arm, initial encounter Anesthetic Wound #4 Left,Medial Malleolus o Topical Lidocaine 4% cream applied to wound bed prior to debridement Wound #  5R Right,Lateral Forearm o Topical Lidocaine 4% cream applied to wound bed prior to debridement Skin Barriers/Peri-Wound Care Wound #4 Left,Medial Malleolus o Skin Prep Wound #5R Right,Lateral Forearm o Skin Prep Primary Wound Dressing Wound #5R Right,Lateral Forearm o Other: - SIltec Sorbact Wound #4 Left,Medial Malleolus o Other: - Sorbact packs into wound covered with Siltec Sorbact Secondary Dressing Wound #4 Left,Medial Malleolus o Conform/Kerlix - Kerlix and Coban lightly wraped from toes to below knee Wound #5R Right,Lateral Forearm PIUS, BYROM. ([id number]) o Conform/Kerlix - Coban to secure Dressing Change Frequency Wound #4 Left,Medial Malleolus o Change dressing every week Wound #5R Right,Lateral Forearm o Change dressing every week Follow-up Appointments Wound #4 Left,Medial Malleolus o Return Appointment in 1 week. Wound #5R Right,Lateral Forearm o Return Appointment in 1 week. Notes Order Grafix for next visit. Electronic Signature(s) Signed: 02/14/2015 3:54:28 PM By: Gretta Cool RN, BSN, Kim RN, BSN Signed: 02/14/2015 4:17:13 PM By: Christin Fudge MD, FACS Entered By: Gretta Cool RN, BSN, Kim on 02/14/2015 10:26:52 JOVEN, MOM ([id number]) -------------------------------------------------------------------------------- Problem List Details Patient Name: TIAGO, HUMPHREY. Date of Service: 02/14/2015 10:00 AM Medical Record Number: [id number] Patient Account Number: [id number] Date of Birth/Sex: February 24, 1919 (81 y.o. Male) Treating RN: Cornell Barman Primary Care Physician: Hortencia Pilar Other Clinician: Referring Physician: Hortencia Pilar Treating Physician/Extender: Frann Rider in Treatment: 61 Active Problems ICD-10 Encounter Code  Description Active Date Diagnosis I70.232 Atherosclerosis of native arteries of right leg with 06/21/2014 Yes ulceration of calf I82.401 Acute embolism and thrombosis of unspecified deep veins 06/21/2014 Yes of right lower extremity S91.301A Unspecified open wound, right foot, initial encounter 07/19/2014 Yes Z92.21 Personal history of antineoplastic chemotherapy 08/23/2014 Yes L97.522 Non-pressure chronic ulcer of other part of left foot with fat 09/20/2014 Yes layer exposed S41.111A Laceration without foreign body of right upper arm, initial 10/04/2014 Yes encounter Inactive Problems Resolved Problems ICD-10 Code Description Active Date Resolved Date L97.212 Non-pressure chronic ulcer of right calf with fat layer 06/21/2014 06/21/2014 exposed S51.812A Laceration without foreign body of left forearm, initial 06/21/2014 06/21/2014 encounter [name] ([id number]) Electronic Signature(s) Signed: 02/14/2015 10:10:01 AM By: Christin Fudge MD, FACS Entered By: Christin Fudge on 02/14/2015 10:10:01 [name] ([id number]) -------------------------------------------------------------------------------- Progress Note Details Patient Name: [name]. Date of Service: 02/14/2015 10:00 AM Medical Record Number: [id number] Patient Account Number: [id number] Date of Birth/Sex: Jun 29, 1918 (81 y.o. Male) Treating RN: Cornell Barman Primary Care Physician: Hortencia Pilar Other Clinician: Referring Physician: Hortencia Pilar Treating Physician/Extender: Frann Rider in Treatment: 60 Subjective Chief Complaint Information obtained from Patient R foot ulcer. L forearm ulcer. 07/19/2014 -- about 2 weeks ago he had a surgical procedure done by dermatologist in [place] and has an open surgical wound on the dorsum of the right foot. History of Present Illness (HPI) The following HPI elements were documented for the patient's wound: Location: right leg Duration: Dec 2015 Modifying Factors: history  of an injury to the right leg with resulting hematoma and thrombophlebitis and later an ulcer of posterior leg Associated Signs and Symptoms: marked lymphedema of the right leg. He is already on Eloquis. 06/14/14 -- He returns for followup today. He denies any fevers. no fresh issues and his daughter says he's been doing fine. 06/21/14 -- after he sustained a fall this week earlier he applied a bandage over this himself and did not seek any medical attention. he did however manage to control the bleeding and had a dressing in place the next morning when his son to the visit.  In this dressing was removed there was further damaged skin. His right leg has been doing fine otherwise. 07/12/14 --Very pleasant 81 year old with past medical history significant for congestive heart failure (EF 15%), peripheral vascular disease, and chronic kidney disease. He was hospitalized at [facility] in December 2015 for congestive heart failure. He says that he fell during his hospital course and developed a hematoma over his right calf. He was also diagnosed with a right lower extremity DVT for which he takes Eliquis. The hematoma subsequently turned into an ulceration around Christmas, which has healed. He subsequently developed an ulcer on his right dorsal foot and a traumatic left forearm ulcer. Per his report, he underwent biopsy of the right dorsal foot ulceration which demonstrated a skin cancer. His PCP and dermatologist office are both closed today. I reviewed his records in [place] but find no report of biopsy or pathology. He s without complaints today. No significant pain. No fever or chills. Minimal drainage. 07/19/2014 - the patient and his son tell me that about 2 weeks ago the dermatologist did a skin biopsy and this was a large area on the dorsum of his right foot which was left open and no dressing instructions were recommended. Since then he has been called and told that it is a cancer and the need to do a  further procedure but that will not happen until about 2 weeks from now. In the meanwhile the patient has not been taking care of his right foot. The left forearm where he had an abrasion and laceration is doing very well. DEMONT, LINFORD ([id number]) 07/26/2014 -- Reports from 07/08/2014 from the dermatology group reviewed. A excision was done of a lesion located on the dorsum of the right foot and this was 1.7 cm in diameter which was a shave biopsy performed. The wound was left open after appropriate cauterization and the patient was given this dressing instructions. 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 08/02/2014 -- all the original problems he came with have completely resolved. He now has a surgical wound on his right foot dorsum where a skin cancer was excised. He goes to see his dermatologist this coming Tuesday and will have definite news next Friday. 08/09/2014 he had gone to his dermatologist on Tuesday and she has injected the base of his ulcer with some chemotherapeutic agent. He was supposed to bring some papers with him but forgot to get them and will bring them in next week. Other than that the dermatologist had suggested using Mehdi honey on the wound. 08/16/2014 -- the patient has brought in his notes from the dermatologist and on 08/06/2014 he received a injection of 5 FU, 500 mg grams into the lesion. the pathology report was also sent and it was a squamous cell carcinoma well-differentiated and edges were involved. They wanted him to use many honey for the wound dressing changes to be done 3 times a week. 08/30/2014 -- he has finished his second injection of 5-FU and has the next one in 2 weeks' time. He is doing fine otherwise. 09/13/2014 - No new complaints. No significant pain. No fever or chills. Minimal drainage. Still receiving 5-FU injections. 09/20/2014 -- He was seen  by the dermatologist on 09/10/2014 and Dr. Phillip Heal injected his foot both the right on the dorsum and left near the medial malleolus with 5-FU. The next dose of 5-FU is to be given after 3 months. The  patient says he now has a spot on the left medial malleolus where he was injected with 5-FU. 09/27/2014 -- the area on the left ankle where he was injected with 5-FU is now a full-blown ulcer. He also has mild pain in both ankle areas. 10/04/2014 - large had a bit of a fall and injured his right arm last evening and has had a laceration with no evidence of any foreign body in the right forearm. 12/20/2014 -- he recently saw his dermatologist at [facility] and she was pleased with his wound healing on the right lower extremity. She has not given him any further 5-FU injections and will see him back on a when necessary basis. 01/03/2015 -- his skin substitute Grafix has been approved by his insurance and we will go ahead with this next week. 01/10/2015 -- he has his first application of Grafix today. he recently had a nightmare and injured his right forearm and had some abrasions. 01/13/2015 -- Iona Beard has developed significant swelling of the left calf with some tenderness of the calf. This was only noticed this morning. Addendum: The DVT study done in the hospital -- IMPRESSION:No evidence of deep venous thrombosis left lower extremity. The result was called into the patient's son who acknowledges the report and will bring the patient back on Friday MERYL, PONDER ([id number]) 01/17/2015 -- he saw his PCP Dr. Hoy Morn and she has increases dosage of Lasix and his edema on the left lower extremity is looking much better. He is here for a second applications of Grafix 40/01/2724 -- he is overall doing very well his edema has come down significantly. He is here for his third application of grafix. 02/04/2015 -- he has no new issues and is here for his fourth application of grafix 36/64/4034 -- he is  here for a wound review today and has no fresh issues. Objective Constitutional Pulse regular. Respirations normal and unlabored. Afebrile. Vitals Time Taken: 9:55 AM, Height: 69 in, Weight: 179 lbs, BMI: 26.4, Temperature: 97.6 F, Pulse: 77 bpm, Respiratory Rate: 18 breaths/min, Blood Pressure: 143/88 mmHg. Eyes Nonicteric. Reactive to light. Ears, Nose, Mouth, and Throat Lips, teeth, and gums WNL.Marland Kitchen Moist mucosa without lesions . Neck supple and nontender. No palpable supraclavicular or cervical adenopathy. Normal sized without goiter. Respiratory WNL. No retractions.. Cardiovascular Pedal Pulses WNL. No clubbing, cyanosis or edema. Lymphatic No adneopathy. No adenopathy. No adenopathy. Musculoskeletal Adexa without tenderness or enlargement.. Digits and nails w/o clubbing, cyanosis, infection, petechiae, ischemia, or inflammatory conditions.Marland Kitchen Psychiatric Judgement and insight Intact.. No evidence of depression, anxiety, or agitation.. General Notes: the right arm looks very good and has healed well. The left medial ankle has significant amount of debris which will be sharply debrided with a curette. WINTON, OFFORD ([id number]) Integumentary (Hair, Skin) No suspicious lesions. No crepitus or fluctuance. No peri-wound warmth or erythema. No masses.. Wound #4 status is Open. Original cause of wound was Other Lesion. The wound is located on the Left,Medial Malleolus. The wound measures 2.5cm length x 2.4cm width x 0.4cm depth; 4.712cm^2 area and 1.885cm^3 volume. There is tendon and fascia exposed. There is a small amount of serosanguineous drainage noted. The wound margin is distinct with the outline attached to the wound base. There is no granulation within the wound bed. There is a large (67-100%) amount of necrotic tissue within the wound bed including Adherent Slough. The periwound skin appearance exhibited: Localized Edema, Maceration, Moist, Erythema. The periwound skin  appearance did not exhibit: Callus, Crepitus, Excoriation, Fluctuance,  Friable, Induration, Rash, Scarring, Dry/Scaly, Atrophie Blanche, Cyanosis, Ecchymosis, Hemosiderin Staining, Mottled, Pallor, Rubor. The surrounding wound skin color is noted with erythema which is circumferential. Periwound temperature was noted as No Abnormality. The periwound has tenderness on palpation. Wound #5R status is Open. Original cause of wound was Shear/Friction. The wound is located on the Right,Lateral Forearm. The wound measures 5cm length x 2cm width x 0.1cm depth; 7.854cm^2 area and 0.785cm^3 volume. The wound is limited to skin breakdown. There is a medium amount of serosanguineous drainage noted. The wound margin is flat and intact. There is large (67-100%) granulation within the wound bed. There is a small (1-33%) amount of necrotic tissue within the wound bed including Eschar. The periwound skin appearance exhibited: Dry/Scaly, Moist. The periwound skin appearance did not exhibit: Callus, Crepitus, Excoriation, Fluctuance, Friable, Induration, Localized Edema, Rash, Scarring, Maceration, Atrophie Blanche, Cyanosis, Ecchymosis, Hemosiderin Staining, Mottled, Pallor, Rubor, Erythema. Periwound temperature was noted as No Abnormality. 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 Siltec Sorbact for his right upper arm to be covered with a Coban and not disturbed for the week. 2. for his left medial ankle we will use Sorbact packed into  the wound and then cover it with foam. 3. This will be covered with a Kerlix Coban to keep the edema down and he will leave this on for the week. UTHMAN, MROCZKOWSKI ([id number]) He will come back to see [place] next week. We will use the fifth application of Grafix next week. Procedures Wound #5R Wound #5R is a Skin Tear located on the Right,Lateral Forearm . There was a Skin/Subcutaneous Tissue Debridement ([id number]) debridement with total area of 10 sq cm performed by Christin Fudge, MD. with the following instrument(s): Forceps to remove Viable and Non-Viable tissue/material including Exudate, Fibrin/Slough, Eschar, and Subcutaneous after achieving pain control using Other (lidiocaine 4%$). A time out was conducted prior to the start of the procedure. A Minimum amount of bleeding was controlled with Pressure. The procedure was tolerated well with a pain level of 0 throughout and a pain level of 0 following the procedure. Post Debridement Measurements: 5cm length x 2cm width x 0.1cm depth; 0.785cm^3 volume. Post procedure Diagnosis Wound #5R: Same as Pre-Procedure Plan Anesthetic: Wound #4 Left,Medial Malleolus: Topical Lidocaine 4% cream applied to wound bed prior to debridement Wound #5R Right,Lateral Forearm: Topical Lidocaine 4% cream applied to wound bed prior to debridement Skin Barriers/Peri-Wound Care: Wound #4 Left,Medial Malleolus: Skin Prep Wound #5R Right,Lateral Forearm: Skin Prep Primary Wound Dressing: Wound #5R Right,Lateral Forearm: Other: - SIltec Sorbact Wound #4 Left,Medial Malleolus: Other: - Sorbact packs into wound covered with Siltec Sorbact Secondary Dressing: Wound #4 Left,Medial Malleolus: Conform/Kerlix - Kerlix and Coban lightly wraped from toes to below knee Wound #5R Right,Lateral Forearm: Conform/Kerlix - Coban to secure Dressing Change Frequency: Wound #4 Left,Medial Malleolus: Change dressing every week Wound #5R Right,Lateral Forearm: Change  dressing every week Follow-up Appointments: Wound #4 Left,Medial MalleolusDILRAJ, KILLGORE ([id number]) Return Appointment in 1 week. Wound #5R Right,Lateral Forearm: Return Appointment in 1 week. General Notes: Order Grafix for next visit.  I have recommended: 1. Siltec Sorbact for his right upper arm to be covered with a Coban and not disturbed for the week. 2. for his left medial ankle we will use Sorbact packed into the wound and then cover it with foam. 3. This will be covered with a Kerlix Coban to keep the edema down and he will leave this on for the week. He will come back to see [place] next week. We will use the fifth application of Grafix next week. Electronic Signature(s) Signed: 02/14/2015 10:37:42 AM By: Christin Fudge MD, FACS Entered By: Christin Fudge on 02/14/2015 10:37:42 [name] ([id number]) -------------------------------------------------------------------------------- [place] Details Patient Name: SALIK, GREWELL. Date of Service: 02/14/2015 Medical Record Number: [id number] Patient Account Number: [id number] Date of Birth/Sex: [birth year] (81 y.o. Male) Treating RN: Cornell Barman Primary Care Physician: Hortencia Pilar Other Clinician: Referring Physician: Hortencia Pilar Treating Physician/Extender: Frann Rider in Treatment: 81 Diagnosis Coding ICD-10 Codes Code Description [phone] 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 I82.401 Acute embolism and thrombosis of unspecified deep veins of right lower extremity S91.301A Unspecified open wound, right foot, initial encounter Z92.21 Personal history of antineoplastic chemotherapy L97.522 Non-pressure chronic ulcer of other part of left foot with fat layer exposed S41.111A Laceration without foreign body of right upper arm, initial encounter Facility Procedures The patient participates with Medicare or their insurance follows the Medicare Facility Guidelines: CPT4 Description  Modifier Quantity Code [id number] 11042 - DEB SUBQ TISSUE 20 SQ CM/< 1 ICD-10 Description Diagnosis I70.232 Atherosclerosis of native  arteries of right leg with ulceration of calf I82.401 Acute embolism and thrombosis of unspecified deep veins of right lower extremity S91.301A Unspecified open wound, right foot, initial encounter L97.522 Non-pressure chronic ulcer of other part of left  foot with fat layer exposed Physician Procedures CPT4: Description Modifier Quantity Code [id number] 74128 - WC PHYS SUBQ TISS 20 SQ CM 1 ICD-10 Description Diagnosis I70.232 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 I82.401 Acute embolism and thrombosis of unspecified deep  veins of right lower extremity S91.301A Unspecified open wound, right foot, initial encounter L97.522 Non-pressure chronic ulcer of other part of left foot with fat layer exposed MERVIN, RAMIRES ([id number]) Electronic Signature(s) Signed: 02/14/2015 10:37:57 AM By: Christin Fudge MD, FACS Entered By: Christin Fudge on 02/14/2015 10:37:57

## 2015-02-15 NOTE — Progress Notes (Signed)
RYELAN, KAZEE ([id number]) Visit Report for 02/14/2015 Arrival Information Details Patient Name: LASARO, PRIMM. Date of Service: 02/14/2015 10:00 AM Medical Record Number: [id number] Patient Account Number: [id number] Date of Birth/Sex: 01-16-1919 (81 y.o. Male) Treating RN: Cornell Barman Primary Care Physician: Hortencia Pilar Other Clinician: Referring Physician: Hortencia Pilar Treating Physician/Extender: Frann Rider in Treatment: 32 Visit Information History Since Last Visit Added or deleted any medications: No Patient Arrived: [name] Any new allergies or adverse reactions: No Arrival Time: 09:54 Had a fall or experienced change in No Accompanied By: caregiver activities of daily living that may affect Transfer Assistance: None risk of falls: Patient Identification Verified: Yes Signs or symptoms of abuse/neglect since last No Secondary Verification Process Yes visito Completed: Hospitalized since last visit: No Patient Requires Transmission- No Has Dressing in Place as Prescribed: Yes Based Precautions: Pain Present Now: No Patient Has Alerts: Yes Patient Alerts: Patient on Blood Thinner No ABIs dt LE blood clots Electronic Signature(s) Signed: 02/14/2015 3:54:28 PM By: Gretta Cool, RN, BSN, Kim RN, BSN Entered By: Gretta Cool, RN, BSN, Kim on 02/14/2015 09:55:01 [name] ([id number]) -------------------------------------------------------------------------------- Encounter Discharge Information Details Patient Name: ZAHMIR, LALLA. Date of Service: 02/14/2015 10:00 AM Medical Record Number: [id number] Patient Account Number: [id number] Date of Birth/Sex: [birth year] (81 y.o. Male) Treating RN: Cornell Barman Primary Care Physician: Hortencia Pilar Other Clinician: Referring Physician: Hortencia Pilar Treating Physician/Extender: Frann Rider in Treatment: 20 Encounter Discharge Information Items Schedule Follow-up Appointment: No Medication Reconciliation  completed and provided to Patient/Care No Tyja Gortney: Provided on Clinical Summary of Care: 02/14/2015 Form Type Recipient Paper Patient GT Electronic Signature(s) Signed: 02/14/2015 10:37:24 AM By: Ruthine Dose Entered By: Ruthine Dose on 02/14/2015 10:37:23 [name] ([id number]) -------------------------------------------------------------------------------- Lower Extremity Assessment Details Patient Name: MOUA, RASMUSSON. Date of Service: 02/14/2015 10:00 AM Medical Record Number: [id number] Patient Account Number: [id number] Date of Birth/Sex: [birth year] (81 y.o. Male) Treating RN: Cornell Barman Primary Care Physician: Hortencia Pilar Other Clinician: Referring Physician: Hortencia Pilar Treating Physician/Extender: Frann Rider in Treatment: 56 Edema Assessment Assessed: [Left: No] [Right: No] Edema: [Left: N] [Right: o] Vascular Assessment Pulses: Posterior Tibial Dorsalis Pedis Palpable: [Left:Yes] Extremity colors, hair growth, and conditions: Extremity Color: [Left:Red] Hair Growth on Extremity: [Left:No] Temperature of Extremity: [Left:Warm] Capillary Refill: [Left:< 3 seconds] Toe Nail Assessment Left: Right: Thick: Yes Discolored: Yes Deformed: Yes Improper Length and Hygiene: No Electronic Signature(s) Signed: 02/14/2015 3:54:28 PM By: Gretta Cool, RN, BSN, Kim RN, BSN Entered By: Gretta Cool, RN, BSN, Kim on 02/14/2015 10:06:09 [name] ([id number]) -------------------------------------------------------------------------------- Multi Wound Chart Details Patient Name: SHAMAN, MUSCARELLA. Date of Service: 02/14/2015 10:00 AM Medical Record Number: [id number] Patient Account Number: [id number] Date of Birth/Sex: 1918-11-14 (81 y.o. Male) Treating RN: Cornell Barman Primary Care Physician: Hortencia Pilar Other Clinician: Referring Physician: Hortencia Pilar Treating Physician/Extender: Frann Rider in Treatment: 62 Vital Signs Height(in):  69 Pulse(bpm): 77 Weight(lbs): 179 Blood Pressure 143/88 (mmHg): Body Mass Index(BMI): 26 Temperature(F): 97.6 Respiratory Rate 18 (breaths/min): Photos: [4:No Photos] [5R:No Photos] [N/A:N/A] Wound Location: [4:Left Malleolus - Medial] [5R:Right Forearm - Lateral] [N/A:N/A] Wounding Event: [4:Other Lesion] [5R:Shear/Friction] [N/A:N/A] Primary Etiology: [4:Malignant Wound] [5R:Skin Tear] [N/A:N/A] Comorbid History: [4:Cataracts, Arrhythmia, Congestive Heart Failure] [5R:Cataracts, Arrhythmia, Congestive Heart Failure] [N/A:N/A] Date Acquired: [4:09/20/2014] [5R:09/22/2014] [N/A:N/A] Weeks of Treatment: [4:21] [5R:19] [N/A:N/A] Wound Status: [4:Open] [5R:Open] [N/A:N/A] Wound Recurrence: [4:No] [5R:Yes] [N/A:N/A] Measurements L x W x D 2.5x2.4x0.4 [5R:5x2x0.1] [N/A:N/A] (cm) Area (cm) : [4:4.712] [5R:7.854] [N/A:N/A] Volume (cm) : [4:1.885] [5R:0.785] [N/A:N/A] %  Reduction in Area: [4:-2901.30%] [5R:-25.00%] [N/A:N/A] % Reduction in Volume: -11681.20% [5R:-25.00%] [N/A:N/A] Classification: [4:Full Thickness With Exposed Support Structures] [5R:Partial Thickness] [N/A:N/A] Exudate Amount: [4:Small] [5R:Medium] [N/A:N/A] Exudate Type: [4:Serosanguineous] [5R:Serosanguineous] [N/A:N/A] Exudate Color: [4:red, brown] [5R:red, brown] [N/A:N/A] Wound Margin: [4:Distinct, outline attached] [5R:Flat and Intact] [N/A:N/A] Granulation Amount: [4:None Present (0%)] [5R:Large (67-100%)] [N/A:N/A] Necrotic Amount: [4:Large (67-100%)] [5R:Small (1-33%)] [N/A:N/A] Necrotic Tissue: [4:Adherent Slough] [5R:Eschar] [N/A:N/A] Exposed Structures: [4:Fascia: Yes Tendon: Yes Fat: No Muscle: No] [5R:Fascia: No Fat: No Tendon: No Muscle: No] [N/A:N/A] Joint: No Joint: No Bone: No Bone: No Limited to Skin Breakdown Epithelialization: Small (1-33%) Small (1-33%) N/A Periwound Skin Texture: Edema: Yes Edema: No N/A Excoriation: No Excoriation: No Induration: No Induration: No Callus: No Callus:  No Crepitus: No Crepitus: No Fluctuance: No Fluctuance: No Friable: No Friable: No Rash: No Rash: No Scarring: No Scarring: No Periwound Skin Maceration: Yes Moist: Yes N/A Moisture: Moist: Yes Dry/Scaly: Yes Dry/Scaly: No Maceration: No Periwound Skin Color: Erythema: Yes Atrophie Blanche: No N/A Atrophie Blanche: No Cyanosis: No Cyanosis: No Ecchymosis: No Ecchymosis: No Erythema: No Hemosiderin Staining: No Hemosiderin Staining: No Mottled: No Mottled: No Pallor: No Pallor: No Rubor: No Rubor: No Erythema Location: Circumferential N/A N/A Temperature: No Abnormality No Abnormality N/A Tenderness on Yes No N/A Palpation: Wound Preparation: Ulcer Cleansing: Ulcer Cleansing: N/A Rinsed/Irrigated with Rinsed/Irrigated with Saline Saline Topical Anesthetic Topical Anesthetic Applied: Other: lidocaine Applied: Other: lidocaine 4% 4% Treatment Notes Electronic Signature(s) Signed: 02/14/2015 3:54:28 PM By: Gretta Cool, RN, BSN, Kim RN, BSN Entered By: Gretta Cool, RN, BSN, Kim on 02/14/2015 10:10:51 [name] ([id number]) -------------------------------------------------------------------------------- [place] Details Patient Name: BIRUK, TROIA. Date of Service: 02/14/2015 10:00 AM Medical Record Number: [id number] Patient Account Number: [id number] Date of Birth/Sex: Aug 30, 1918 (81 y.o. Male) Treating RN: Cornell Barman Primary Care Physician: Hortencia Pilar Other Clinician: Referring Physician: Hortencia Pilar Treating Physician/Extender: Frann Rider in Treatment: 44 Active Inactive Abuse / Safety / Falls / Self Care Management Nursing Diagnoses: Abuse or neglect; actual or potential Potential for falls Goals: Patient will remain injury free Date Initiated: 05/02/2014 Goal Status: Active Interventions: Assess fall risk on admission and as needed Notes: Necrotic Tissue Nursing Diagnoses: Impaired tissue integrity related to  necrotic/devitalized tissue Goals: Necrotic/devitalized tissue will be minimized in the wound bed Date Initiated: 05/02/2014 Goal Status: Active Interventions: Assess patient pain level pre-, during and post procedure and prior to discharge Treatment Activities: Apply topical anesthetic as ordered : 02/14/2015 Notes: Orientation to the Wound Care Program Nursing Diagnoses: Knowledge deficit related to the wound healing center program JESSELEE, POTH ([id number]) Goals: Patient/caregiver will verbalize understanding of the [facility] Date Initiated: 05/02/2014 Goal Status: Active Interventions: Provide education on orientation to the wound center Notes: Electronic Signature(s) Signed: 02/14/2015 3:54:28 PM By: Gretta Cool, RN, BSN, Kim RN, BSN Entered By: Gretta Cool, RN, BSN, Kim on 02/14/2015 10:10:44 [name] ([id number]) -------------------------------------------------------------------------------- Pain Assessment Details Patient Name: KASPIAN, MUCCIO. Date of Service: 02/14/2015 10:00 AM Medical Record Number: [id number] Patient Account Number: [id number] Date of Birth/Sex: 08-19-1918 (81 y.o. Male) Treating RN: Cornell Barman Primary Care Physician: Hortencia Pilar Other Clinician: Referring Physician: Hortencia Pilar Treating Physician/Extender: Frann Rider in Treatment: 51 Active Problems Location of Pain Severity and Description of Pain Patient Has Paino No Site Locations Pain Management and Medication Current Pain Management: Electronic Signature(s) Signed: 02/14/2015 3:54:28 PM By: Gretta Cool, RN, BSN, Kim RN, BSN Entered By: Gretta Cool, RN, BSN, Kim on 02/14/2015 09:55:06 [name] ([id number]) --------------------------------------------------------------------------------  Wound Assessment Details Patient Name: GAITHER, BIEHN. Date of Service: 02/14/2015 10:00 AM Medical Record Number: [id number] Patient Account Number: [id number] Date of  Birth/Sex: [birth year] (81 y.o. Male) Treating RN: Cornell Barman Primary Care Physician: Hortencia Pilar Other Clinician: Referring Physician: Hortencia Pilar Treating Physician/Extender: Frann Rider in Treatment: 56 Wound Status Wound Number: 4 Primary Malignant Wound Etiology: Wound Location: Left Malleolus - Medial Wound Status: Open Wounding Event: Other Lesion Comorbid Cataracts, Arrhythmia, Congestive Date Acquired: 09/20/2014 History: Heart Failure Weeks Of Treatment: 21 Clustered Wound: No Photos Photo Uploaded By: Gretta Cool, RN, BSN, Kim on 02/14/2015 17:42:33 Wound Measurements Length: (cm) 2.5 Width: (cm) 2.4 Depth: (cm) 0.4 Area: (cm) 4.712 Volume: (cm) 1.885 % Reduction in Area: -2901.3% % Reduction in Volume: -11681.2% Epithelialization: Small (1-33%) Wound Description Full Thickness With Exposed Foul Odor Aft Classification: Support Structures Wound Margin: Distinct, outline attached Exudate Small Amount: Exudate Type: Serosanguineous Exudate Color: red, brown er Cleansing: No Wound Bed Granulation Amount: None Present (0%) Exposed Structure Necrotic Amount: Large (67-100%) Fascia Exposed: Yes Necrotic Quality: Adherent Slough Fat Layer Exposed: No TAMMIE, ELLSWORTH ([id number]) Tendon Exposed: Yes Muscle Exposed: No Joint Exposed: No Bone Exposed: No Periwound Skin Texture Texture Color No Abnormalities Noted: No No Abnormalities Noted: No Callus: No Atrophie Blanche: No Crepitus: No Cyanosis: No Excoriation: No Ecchymosis: No Fluctuance: No Erythema: Yes Friable: No Erythema Location: Circumferential Induration: No Hemosiderin Staining: No Localized Edema: Yes Mottled: No Rash: No Pallor: No Scarring: No Rubor: No Moisture Temperature / Pain No Abnormalities Noted: No Temperature: No Abnormality Dry / Scaly: No Tenderness on Palpation: Yes Maceration: Yes Moist: Yes Wound Preparation Ulcer Cleansing: Rinsed/Irrigated with  Saline Topical Anesthetic Applied: Other: lidocaine 4%, Treatment Notes Wound #4 (Left, Medial Malleolus) 1. Cleansed with: Clean wound with Normal Saline 2. Anesthetic Topical Lidocaine 4% cream to wound bed prior to debridement 4. Dressing Applied: Other dressing (specify in notes) Notes siltec packed into ankle wound; both wounds covered with SIltec sorbact; eg wrapped in Kerlix and coban to knee Electronic Signature(s) Signed: 02/14/2015 3:54:28 PM By: Gretta Cool, RN, BSN, Kim RN, BSN Entered By: Gretta Cool, RN, BSN, Kim on 02/14/2015 10:06:54 [name] ([id number]) -------------------------------------------------------------------------------- Wound Assessment Details Patient Name: JARVIN, OGREN. Date of Service: 02/14/2015 10:00 AM Medical Record Number: [id number] Patient Account Number: [id number] Date of Birth/Sex: 04/23/1918 (81 y.o. Male) Treating RN: Cornell Barman Primary Care Physician: Hortencia Pilar Other Clinician: Referring Physician: Hortencia Pilar Treating Physician/Extender: Frann Rider in Treatment: 62 Wound Status Wound Number: 5R Primary Skin Tear Etiology: Wound Location: Right Forearm - Lateral Wound Status: Open Wounding Event: Shear/Friction Comorbid Cataracts, Arrhythmia, Congestive Date Acquired: 09/22/2014 History: Heart Failure Weeks Of Treatment: 19 Clustered Wound: No Photos Photo Uploaded By: Gretta Cool, RN, BSN, Kim on 02/14/2015 17:53:47 Wound Measurements Length: (cm) 5 Width: (cm) 2 Depth: (cm) 0.1 Area: (cm) 7.854 Volume: (cm) 0.785 % Reduction in Area: -25% % Reduction in Volume: -25% Epithelialization: Small (1-33%) Wound Description Classification: Partial Thickness Foul Odor Aft Wound Margin: Flat and Intact Exudate Amount: Medium Exudate Type: Serosanguineous Exudate Color: red, brown er Cleansing: No Wound Bed Granulation Amount: Large (67-100%) Exposed Structure Necrotic Amount: Small (1-33%) Fascia Exposed:  No Necrotic Quality: Eschar Fat Layer Exposed: No Tendon Exposed: No NELL, SCHRACK. ([id number]) Muscle Exposed: No Joint Exposed: No Bone Exposed: No Limited to Skin Breakdown Periwound Skin Texture Texture Color No Abnormalities Noted: No No Abnormalities Noted: No Callus: No Atrophie Blanche: No Crepitus: No Cyanosis: No Excoriation: No Ecchymosis:  No Fluctuance: No Erythema: No Friable: No Hemosiderin Staining: No Induration: No Mottled: No Localized Edema: No Pallor: No Rash: No Rubor: No Scarring: No Temperature / Pain Moisture Temperature: No Abnormality No Abnormalities Noted: No Dry / Scaly: Yes Maceration: No Moist: Yes Wound Preparation Ulcer Cleansing: Rinsed/Irrigated with Saline Topical Anesthetic Applied: Other: lidocaine 4%, Treatment Notes Wound #5R (Right, Lateral Forearm) 1. Cleansed with: Clean wound with Normal Saline 2. Anesthetic Topical Lidocaine 4% cream to wound bed prior to debridement 4. Dressing Applied: Other dressing (specify in notes) Notes siltec packed into ankle wound; both wounds covered with SIltec sorbact; eg wrapped in Kerlix and coban to knee Electronic Signature(s) Signed: 02/14/2015 3:54:28 PM By: Gretta Cool, RN, BSN, Kim RN, BSN Entered By: Gretta Cool, RN, BSN, Kim on 02/14/2015 10:07:20 [name] ([id number]) -------------------------------------------------------------------------------- [place] Details Patient Name: KAYLOR, SIMENSON. Date of Service: 02/14/2015 10:00 AM Medical Record Number: [id number] Patient Account Number: [id number] Date of Birth/Sex: Jun 22, 1918 (81 y.o. Male) Treating RN: Cornell Barman Primary Care Physician: Hortencia Pilar Other Clinician: Referring Physician: Hortencia Pilar Treating Physician/Extender: Frann Rider in Treatment: 41 Vital Signs Time Taken: 09:55 Temperature (F): 97.6 Height (in): 69 Pulse (bpm): 77 Weight (lbs): 179 Respiratory Rate (breaths/min): 18 Body Mass  Index (BMI): 26.4 Blood Pressure (mmHg): 143/88 Reference Range: 80 - 120 mg / dl Electronic Signature(s) Signed: 02/14/2015 3:54:28 PM By: Gretta Cool, RN, BSN, Kim RN, BSN Entered By: Gretta Cool, RN, BSN, Kim on 02/14/2015 09:55:22

## 2015-02-21 ENCOUNTER — Encounter: Payer: Medicare Other | Attending: Surgery | Admitting: Surgery

## 2015-02-21 DIAGNOSIS — I70232 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: Secondary | ICD-10-CM | POA: Insufficient documentation

## 2015-02-21 DIAGNOSIS — X58XXXD Exposure to other specified factors, subsequent encounter: Secondary | ICD-10-CM | POA: Diagnosis not present

## 2015-02-21 DIAGNOSIS — Z9221 Personal history of antineoplastic chemotherapy: Secondary | ICD-10-CM | POA: Insufficient documentation

## 2015-02-21 DIAGNOSIS — S91301D Unspecified open wound, right foot, subsequent encounter: Secondary | ICD-10-CM | POA: Diagnosis not present

## 2015-02-21 DIAGNOSIS — I82401 Acute embolism and thrombosis of unspecified deep veins of right lower extremity: Secondary | ICD-10-CM | POA: Diagnosis not present

## 2015-02-21 DIAGNOSIS — L97522 Non-pressure chronic ulcer of other part of left foot with fat layer exposed: Secondary | ICD-10-CM | POA: Insufficient documentation

## 2015-02-21 DIAGNOSIS — S41111D Laceration without foreign body of right upper arm, subsequent encounter: Secondary | ICD-10-CM | POA: Insufficient documentation

## 2015-02-21 NOTE — Progress Notes (Signed)
Philip [name], Philip [name] ([id number]) Visit Report for 02/21/2015 Arrival Information Details Patient Name: Philip [name], Philip [name]. Date of Service: 02/21/2015 10:15 AM Medical Record Number: [id number] Patient Account Number: [id number] Date of Birth/Sex: 10-26-1918 (81 y.o. Male) Treating RN: Cornell Barman Primary Care Physician: Hortencia Pilar Other Clinician: Referring Physician: Hortencia Pilar Treating Physician/Extender: Frann Rider in Treatment: 55 Visit Information History Since Last Visit Added or deleted any medications: No Patient Arrived: [name] Any new allergies or adverse reactions: No Arrival Time: 10:09 Had a fall or experienced change in No Accompanied By: son activities of daily living that may affect Transfer Assistance: None risk of falls: Patient Identification Verified: Yes Signs or symptoms of abuse/neglect since last No Secondary Verification Process Yes visito Completed: Hospitalized since last visit: No Patient Requires Transmission- No Has Dressing in Place as Prescribed: Yes Based Precautions: Pain Present Now: No Patient Has Alerts: Yes Patient Alerts: Patient on Blood Thinner No ABIs dt LE blood clots Electronic Signature(s) Signed: 02/21/2015 12:00:49 PM By: Gretta Cool, RN, BSN, Kim RN, BSN Entered By: Gretta Cool, RN, BSN, Kim on 02/21/2015 10:09:58 [name] ([id number]) -------------------------------------------------------------------------------- Encounter Discharge Information Details Patient Name: Philip [name], Philip [name]. Date of Service: 02/21/2015 10:15 AM Medical Record Number: [id number] Patient Account Number: [id number] Date of Birth/Sex: May 30, 1918 (81 y.o. Male) Treating RN: Cornell Barman Primary Care Physician: Hortencia Pilar Other Clinician: Referring Physician: Hortencia Pilar Treating Physician/Extender: Frann Rider in Treatment: 84 Encounter Discharge Information Items Discharge Pain Level: 0 Discharge Condition: Stable Ambulatory  Status: Walker Admitted to Discharge Destination: Hospital Transportation: Other Accompanied By: son Schedule Follow-up Appointment: Yes Medication Reconciliation completed Yes and provided to Patient/Care Reina Wilton: Provided on Clinical Summary of Care: 02/21/2015 Form Type Recipient Paper Patient GT Electronic Signature(s) Signed: 02/21/2015 12:00:49 PM By: Gretta Cool, RN, BSN, Kim RN, BSN Previous Signature: 02/21/2015 10:56:34 AM Version By: Ruthine Dose Entered By: Gretta Cool RN, BSN, Kim on 02/21/2015 11:02:42 [name] ([id number]) -------------------------------------------------------------------------------- Lower Extremity Assessment Details Patient Name: Philip [name], Philip [name]. Date of Service: 02/21/2015 10:15 AM Medical Record Number: [id number] Patient Account Number: [id number] Date of Birth/Sex: [birth year] (81 y.o. Male) Treating RN: Cornell Barman Primary Care Physician: Hortencia Pilar Other Clinician: Referring Physician: Hortencia Pilar Treating Physician/Extender: Frann Rider in Treatment: 26 Vascular Assessment Pulses: Posterior Tibial Dorsalis Pedis Palpable: [Left:Yes] Extremity colors, hair growth, and conditions: Extremity Color: [Left:Pale] Hair Growth on Extremity: [Left:No] Temperature of Extremity: [Left:Warm] Capillary Refill: [Left:< 3 seconds] Toe Nail Assessment Left: Right: Thick: Yes Discolored: Yes Deformed: Yes Improper Length and Hygiene: No Electronic Signature(s) Signed: 02/21/2015 12:00:49 PM By: Gretta Cool, RN, BSN, Kim RN, BSN Entered By: Gretta Cool, RN, BSN, Kim on 02/21/2015 10:22:48 [name] ([id number]) -------------------------------------------------------------------------------- Multi Wound Chart Details Patient Name: Philip [name], Philip [name]. Date of Service: 02/21/2015 10:15 AM Medical Record Number: [id number] Patient Account Number: [id number] Date of Birth/Sex: 1918-07-27 (81 y.o. Male) Treating RN: Cornell Barman Primary Care  Physician: Hortencia Pilar Other Clinician: Referring Physician: Hortencia Pilar Treating Physician/Extender: Frann Rider in Treatment: 8 Vital Signs Height(in): 69 Pulse(bpm): 63 Weight(lbs): 179 Blood Pressure 124/40 (mmHg): Body Mass Index(BMI): 26 Temperature(F): 97.6 Respiratory Rate 18 (breaths/min): Photos: [4:No Photos] [5R:No Photos] [N/A:N/A] Wound Location: [4:Left, Medial Malleolus] [5R:Right, Lateral Forearm] [N/A:N/A] Wounding Event: [4:Other Lesion] [5R:Shear/Friction] [N/A:N/A] Primary Etiology: [4:Malignant Wound] [5R:Skin Tear] [N/A:N/A] Date Acquired: [4:09/20/2014] [5R:09/22/2014] [N/A:N/A] Weeks of Treatment: [4:22] [5R:20] [N/A:N/A] Wound Status: [4:Open] [5R:Open] [N/A:N/A] Wound Recurrence: [4:No] [5R:Yes] [N/A:N/A] Measurements L x W x D 2.5x2.5x0.4 [5R:5x10x0.1] [N/A:N/A] (cm) Area (cm) : [4:6.568] [5R:39.27] [  N/A:N/A] Volume (cm) : [4:1.963] [5R:3.927] [N/A:N/A] % Reduction in Area: [4:-3026.80%] [5R:-525.00%] [N/A:N/A] % Reduction in Volume: -12168.70% [5R:-525.30%] [N/A:N/A] Classification: [4:Full Thickness With Exposed Support Structures] [5R:Partial Thickness] [N/A:N/A] Periwound Skin Texture: No Abnormalities Noted [5R:No Abnormalities Noted] [N/A:N/A] Periwound Skin [4:No Abnormalities Noted] [5R:No Abnormalities Noted] [N/A:N/A] Moisture: Periwound Skin Color: No Abnormalities Noted [5R:No Abnormalities Noted] [N/A:N/A] Tenderness on [4:No] [5R:No] [N/A:N/A] Treatment Notes Electronic Signature(s) Signed: 02/21/2015 12:00:49 PM By: Gretta Cool, RN, BSN, Kim RN, BSN [address] [place] ([id number]) Entered By: Gretta Cool, RN, BSN, Kim on 02/21/2015 10:29:26 [name] ([id number]) -------------------------------------------------------------------------------- [place] Details Patient Name: Philip [name], Philip [name]. Date of Service: 02/21/2015 10:15 AM Medical Record Number: [id number] Patient Account Number: [id number] Date of  Birth/Sex: 1918-07-24 (81 y.o. Male) Treating RN: Cornell Barman Primary Care Physician: Hortencia Pilar Other Clinician: Referring Physician: Hortencia Pilar Treating Physician/Extender: Frann Rider in Treatment: 77 Active Inactive Abuse / Safety / Falls / Self Care Management Nursing Diagnoses: Abuse or neglect; actual or potential Potential for falls Goals: Patient will remain injury free Date Initiated: 05/02/2014 Goal Status: Active Interventions: Assess fall risk on admission and as needed Notes: Necrotic Tissue Nursing Diagnoses: Impaired tissue integrity related to necrotic/devitalized tissue Goals: Necrotic/devitalized tissue will be minimized in the wound bed Date Initiated: 05/02/2014 Goal Status: Active Interventions: Assess patient pain level pre-, during and post procedure and prior to discharge Treatment Activities: Apply topical anesthetic as ordered : 02/21/2015 Notes: Orientation to the Wound Care Program Nursing Diagnoses: Knowledge deficit related to the wound healing center program Philip [name], Philip [name] ([id number]) Goals: Patient/caregiver will verbalize understanding of the [facility] Program Date Initiated: 05/02/2014 Goal Status: Active Interventions: Provide education on orientation to the wound center Notes: Electronic Signature(s) Signed: 02/21/2015 12:00:49 PM By: Gretta Cool, RN, BSN, Kim RN, BSN Entered By: Gretta Cool, RN, BSN, Kim on 02/21/2015 10:29:19 [name] ([id number]) -------------------------------------------------------------------------------- Pain Assessment Details Patient Name: Philip [name], Philip [name]. Date of Service: 02/21/2015 10:15 AM Medical Record Number: [id number] Patient Account Number: [id number] Date of Birth/Sex: [birth year] (81 y.o. Male) Treating RN: Cornell Barman Primary Care Physician: Hortencia Pilar Other Clinician: Referring Physician: Hortencia Pilar Treating Physician/Extender: Frann Rider in Treatment:  86 Active Problems Location of Pain Severity and Description of Pain Patient Has Paino No Site Locations Pain Management and Medication Current Pain Management: Electronic Signature(s) Signed: 02/21/2015 12:00:49 PM By: Gretta Cool, RN, BSN, Kim RN, BSN Entered By: Gretta Cool, RN, BSN, Kim on 02/21/2015 10:10:06 [name] ([id number]) -------------------------------------------------------------------------------- Patient/Caregiver Education Details Patient Name: Philip [name], Philip [name]. Date of Service: 02/21/2015 10:15 AM Medical Record Number: [id number] Patient Account Number: [id number] Date of Birth/Gender: 04-23-1918 (80 y.o. Male) Treating RN: Cornell Barman Primary Care Physician: Hortencia Pilar Other Clinician: Referring Physician: Hortencia Pilar Treating Physician/Extender: Frann Rider in Treatment: 76 Education Assessment Education Provided To: Patient Education Topics Provided Wound/Skin Impairment: Handouts: Other: wound care as prescribed Methods: Demonstration, Explain/Verbal Responses: State content correctly Electronic Signature(s) Signed: 02/21/2015 12:00:49 PM By: Gretta Cool, RN, BSN, Kim RN, BSN Entered By: Gretta Cool, RN, BSN, Kim on 02/21/2015 11:02:59 [name] ([id number]) -------------------------------------------------------------------------------- Wound Assessment Details Patient Name: Philip [name], Philip [name]. Date of Service: 02/21/2015 10:15 AM Medical Record Number: [id number] Patient Account Number: [id number] Date of Birth/Sex: [birth year] (81 y.o. Male) Treating RN: Cornell Barman Primary Care Physician: Hortencia Pilar Other Clinician: Referring Physician: Hortencia Pilar Treating Physician/Extender: Frann Rider in Treatment: 42 Wound Status Wound Number: 4 Primary Etiology: Malignant Wound Wound Location: Left, Medial Malleolus Wound Status: Open Wounding Event:  Other Lesion Date Acquired: 09/20/2014 Weeks Of Treatment: 22 Clustered Wound:  No Photos Photo Uploaded By: Gretta Cool, RN, BSN, Kim on 02/21/2015 14:03:56 Wound Measurements Length: (cm) 2.5 Width: (cm) 2.5 Depth: (cm) 0.4 Area: (cm) 4.909 Volume: (cm) 1.963 % Reduction in Area: -3026.8% % Reduction in Volume: -12168.7% Wound Description Full Thickness With Exposed Support Classification: Structures Periwound Skin Texture Texture Color No Abnormalities Noted: No No Abnormalities Noted: No Moisture No Abnormalities Noted: No Treatment Notes Wound #4 (Left, Medial Malleolus) Philip [name], GRIES. ([id number]) 1. Cleansed with: Clean wound with Normal Saline 2. Anesthetic Topical Lidocaine 4% cream to wound bed prior to debridement 4. Dressing Applied: Mepitel 5. Secondary Dressing Applied Kerlix/Conform Notes vasaline gauze, non-adhearant, kerlix and coban on forearm. Kerlix and Coban from base of toe to knee Electronic Signature(s) Signed: 02/21/2015 12:00:49 PM By: Gretta Cool, RN, BSN, Kim RN, BSN Entered By: Gretta Cool, RN, BSN, Kim on 02/21/2015 10:23:48 [name] ([id number]) -------------------------------------------------------------------------------- Wound Assessment Details Patient Name: Philip [name], Philip [name]. Date of Service: 02/21/2015 10:15 AM Medical Record Number: [id number] Patient Account Number: [id number] Date of Birth/Sex: 06/26/1918 (81 y.o. Male) Treating RN: Cornell Barman Primary Care Physician: Hortencia Pilar Other Clinician: Referring Physician: Hortencia Pilar Treating Physician/Extender: Frann Rider in Treatment: 33 Wound Status Wound Number: 5R Primary Etiology: Skin Tear Wound Location: Right, Lateral Forearm Wound Status: Open Wounding Event: Shear/Friction Date Acquired: 09/22/2014 Weeks Of Treatment: 20 Clustered Wound: No Photos Photo Uploaded By: Gretta Cool, RN, BSN, Kim on 02/21/2015 14:04:09 Wound Measurements Length: (cm) 5 Width: (cm) 10 Depth: (cm) 0.1 Area: (cm) 39.27 Volume: (cm) 3.927 % Reduction in Area:  -525% % Reduction in Volume: -525.3% Wound Description Classification: Partial Thickness Periwound Skin Texture Texture Color No Abnormalities Noted: No No Abnormalities Noted: No Moisture No Abnormalities Noted: No Treatment Notes Wound #5R (Right, Lateral Forearm) 1. Cleansed with: [name] ([id number]) Clean wound with Normal Saline 2. Anesthetic Topical Lidocaine 4% cream to wound bed prior to debridement 4. Dressing Applied: Mepitel 5. Secondary Dressing Applied Kerlix/Conform Notes vasaline gauze, non-adhearant, kerlix and coban on forearm. Kerlix and Coban from base of toe to knee Electronic Signature(s) Signed: 02/21/2015 12:00:49 PM By: Gretta Cool, RN, BSN, Kim RN, BSN Entered By: Gretta Cool, RN, BSN, Kim on 02/21/2015 10:23:53 [name] ([id number]) -------------------------------------------------------------------------------- [place] Details Patient Name: WILL, HEINKEL. Date of Service: 02/21/2015 10:15 AM Medical Record Number: [id number] Patient Account Number: [id number] Date of Birth/Sex: 07-31-1918 (81 y.o. Male) Treating RN: Cornell Barman Primary Care Physician: Hortencia Pilar Other Clinician: Referring Physician: Hortencia Pilar Treating Physician/Extender: Frann Rider in Treatment: 92 Vital Signs Time Taken: 10:10 Temperature (F): 97.6 Height (in): 69 Pulse (bpm): 63 Weight (lbs): 179 Respiratory Rate (breaths/min): 18 Body Mass Index (BMI): 26.4 Blood Pressure (mmHg): 124/40 Reference Range: 80 - 120 mg / dl Notes MD notified of BP. Electronic Signature(s) Signed: 02/21/2015 12:00:49 PM By: Gretta Cool, RN, BSN, Kim RN, BSN Entered By: Gretta Cool, RN, BSN, Kim on 02/21/2015 10:13:26

## 2015-02-21 NOTE — Progress Notes (Addendum)
JDYN, PARKERSON ([id number]) Visit Report for 02/21/2015 Chief Complaint Document Details Patient Name: Philip [name], Philip [name]. Date of Service: 02/21/2015 10:15 AM Medical Record Number: [id number] Patient Account Number: [id number] Date of Birth/Sex: 23-Aug-1918 (81 y.o. Male) Treating RN: Cornell Barman Primary Care Physician: Hortencia Pilar Other Clinician: Referring Physician: Hortencia Pilar Treating Physician/Extender: Frann Rider in Treatment: 4 Information Obtained from: Patient Chief Complaint R foot ulcer. L forearm ulcer. 07/19/2014 -- about 2 weeks ago he had a surgical procedure done by dermatologist in [place] and has an open surgical wound on the dorsum of the right foot. Electronic Signature(s) Signed: 02/21/2015 10:53:45 AM By: Christin Fudge MD, FACS Entered By: Christin Fudge on 02/21/2015 10:53:45 Philip [name] ([id number]) -------------------------------------------------------------------------------- Cellular or Tissue Based Product Details Patient Name: Philip [name], Philip [name]. Date of Service: 02/21/2015 10:15 AM Medical Record Number: [id number] Patient Account Number: [id number] Date of Birth/Sex: 1918-11-13 (81 y.o. Male) Treating RN: Cornell Barman Primary Care Physician: Hortencia Pilar Other Clinician: Referring Physician: Hortencia Pilar Treating Physician/Extender: Frann Rider in Treatment: 11 Cellular or Tissue Based Wound #4 Left,Medial Malleolus Product Type Applied to: Performed By: Physician Christin Fudge, MD Cellular or Tissue Based Grafix prime Product Type: Time-Out Taken: Yes Location: genitalia / hands / feet / multiple digits Wound Size (sq cm): 6.25 Product Size (sq cm): 6 Waste Size (sq cm): 0 Amount of Product Applied (sq cm): 6 Lot #: [id number] Order #: [id number] Expiration Date: 01/05/2017 Fenestrated: No Reconstituted: No Secured: Yes Secured With: Steri-Strips Dressing Applied: Yes Primary Dressing: mepitel Procedural Pain: 0 Post  Procedural Pain: 0 Response to Treatment: Procedure was tolerated well Post Procedure Diagnosis Same as Pre-procedure Electronic Signature(s) Signed: 02/21/2015 10:53:36 AM By: Christin Fudge MD, FACS Entered By: Christin Fudge on 02/21/2015 10:53:36 Philip [name] ([id number]) -------------------------------------------------------------------------------- HPI Details Patient Name: Philip [name]. Date of Service: 02/21/2015 10:15 AM Medical Record Number: [id number] Patient Account Number: [id number] Date of Birth/Sex: 1918-06-01 (81 y.o. Male) Treating RN: Cornell Barman Primary Care Physician: Hortencia Pilar Other Clinician: Referring Physician: Hortencia Pilar Treating Physician/Extender: Frann Rider in Treatment: 13 History of Present Illness Location: right leg Duration: Dec 2015 Modifying Factors: history of an injury to the right leg with resulting hematoma and thrombophlebitis and later an ulcer of posterior leg Associated Signs and Symptoms: marked lymphedema of the right leg. He is already on Eloquis. HPI Description: 06/14/14 -- He returns for followup today. He denies any fevers. no fresh issues and his daughter says he's been doing fine. 06/21/14 -- after he sustained a fall this week earlier he applied a bandage over this himself and did not seek any medical attention. he did however manage to control the bleeding and had a dressing in place the next morning when his son to the visit. In this dressing was removed there was further damaged skin. His right leg has been doing fine otherwise. 07/12/14 --Very pleasant 81 year old with past medical history significant for congestive heart failure (EF 15%), peripheral vascular disease, and chronic kidney disease. He was hospitalized at [facility] in December 2015 for congestive heart failure. He says that he fell during his hospital course and developed a hematoma over his right calf. He was also diagnosed with a right lower  extremity DVT for which he takes Eliquis. The hematoma subsequently turned into an ulceration around Christmas, which has healed. He subsequently developed an ulcer on his right dorsal foot and a traumatic left forearm ulcer. Per his report, he underwent biopsy of the right dorsal  foot ulceration which demonstrated a skin cancer. His PCP and dermatologist office are both closed today. I reviewed his records in [place] but find no report of biopsy or pathology. He s without complaints today. No significant pain. No fever or chills. Minimal drainage. 07/19/2014 - the patient and his son tell me that about 2 weeks ago the dermatologist did a skin biopsy and this was a large area on the dorsum of his right foot which was left open and no dressing instructions were recommended. Since then he has been called and told that it is a cancer and the need to do a further procedure but that will not happen until about 2 weeks from now. In the meanwhile the patient has not been taking care of his right foot. The left forearm where he had an abrasion and laceration is doing very well. 07/26/2014 -- Reports from 07/08/2014 from the dermatology group reviewed. A excision was done of a lesion located on the dorsum of the right foot and this was 1.7 cm in diameter which was a shave biopsy performed. The wound was left open after appropriate cauterization and the patient was given this dressing instructions. 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 08/02/2014 -- all the original problems he came with have completely resolved. He now has a surgical wound on his right foot dorsum where a skin cancer was excised. He goes to see his dermatologist this VI, Philip [name] ([id number]) coming Tuesday and will have definite news next Friday. 08/09/2014 he had gone to his dermatologist on Tuesday and she has injected the base of his  ulcer with some chemotherapeutic agent. He was supposed to bring some papers with him but forgot to get them and will bring them in next week. Other than that the dermatologist had suggested using Mehdi honey on the wound. 08/16/2014 -- the patient has brought in his notes from the dermatologist and on 08/06/2014 he received a injection of 5 FU, 500 mg grams into the lesion. the pathology report was also sent and it was a squamous cell carcinoma well-differentiated and edges were involved. They wanted him to use many honey for the wound dressing changes to be done 3 times a week. 08/30/2014 -- he has finished his second injection of 5-FU and has the next one in 2 weeks' time. He is doing fine otherwise. 09/13/2014 - No new complaints. No significant pain. No fever or chills. Minimal drainage. Still receiving 5-FU injections. 09/20/2014 -- He was seen by the dermatologist on 09/10/2014 and Dr. Phillip Heal injected his foot both the right on the dorsum and left near the medial malleolus with 5-FU. The next dose of 5-FU is to be given after 3 months. The patient says he now has a spot on the left medial malleolus where he was injected with 5-FU. 09/27/2014 -- the area on the left ankle where he was injected with 5-FU is now a full-blown ulcer. He also has mild pain in both ankle areas. 10/04/2014 - large had a bit of a fall and injured his right arm last evening and has had a laceration with no evidence of any foreign body in the right forearm. 12/20/2014 -- he recently saw his dermatologist at [facility] and she was pleased with his wound healing on the right lower extremity. She has not given him any further 5-FU injections and will see him back on a when necessary basis. 01/03/2015 -- his skin  substitute Grafix has been approved by his insurance and we will go ahead with this next week. 01/10/2015 -- he has his first application of Grafix today. he recently had a nightmare and injured his right  forearm and had some abrasions. 01/13/2015 -- Iona Beard has developed significant swelling of the left calf with some tenderness of the calf. This was only noticed this morning. Addendum: The DVT study done in the hospital -- IMPRESSION:No evidence of deep venous thrombosis left lower extremity. The result was called into the patient's son who acknowledges the report and will bring the patient back on Friday 01/17/2015 -- he saw his PCP Dr. Hoy Morn and she has increases dosage of Lasix and his edema on the left lower extremity is looking much better. He is here for a second applications of Grafix 19/37/9024 -- he is overall doing very well his edema has come down significantly. He is here for his third application of grafix. 02/04/2015 -- he has no new issues and is here for his fourth application of grafix 09/73/5329 -- he is here for a wound review today and has no fresh issues. 02/21/2015 -- he has no new issues and is here for his fifth and last application of grafix. NICKIE, DEREN ([id number]) Electronic Signature(s) Signed: 02/21/2015 10:54:22 AM By: Christin Fudge MD, FACS Entered By: Christin Fudge on 02/21/2015 10:54:21 YOMAR, MEJORADO ([id number]) -------------------------------------------------------------------------------- Physical Exam Details Patient Name: Philip [name], Philip [name]. Date of Service: 02/21/2015 10:15 AM Medical Record Number: [id number] Patient Account Number: [id number] Date of Birth/Sex: [birth year] (81 y.o. Male) Treating RN: Cornell Barman Primary Care Physician: Hortencia Pilar Other Clinician: Referring Physician: Hortencia Pilar Treating Physician/Extender: Frann Rider in Treatment: 42 Constitutional . Pulse regular. Respirations normal and unlabored. Afebrile. . Eyes Nonicteric. Reactive to light. Ears, Nose, Mouth, and Throat Lips, teeth, and gums WNL.Marland Kitchen Moist mucosa without lesions . Neck supple and nontender. No palpable supraclavicular or cervical  adenopathy. Normal sized without goiter. Respiratory WNL. No retractions.. Cardiovascular Pedal Pulses WNL. No clubbing, cyanosis or edema. Lymphatic No adneopathy. No adenopathy. No adenopathy. Musculoskeletal Adexa without tenderness or enlargement.. Digits and nails w/o clubbing, cyanosis, infection, petechiae, ischemia, or inflammatory conditions.. Integumentary (Hair, Skin) No suspicious lesions. No crepitus or fluctuance. No peri-wound warmth or erythema. No masses.Marland Kitchen Psychiatric Judgement and insight Intact.. No evidence of depression, anxiety, or agitation.. Notes his right arm is healed and looks well except that he is got some tape burn at the edges of where the dressing was. The left medial ankle has some granulation tissue and is on fascia and we will continue to use his grafix today. Electronic Signature(s) Signed: 02/21/2015 10:55:21 AM By: Christin Fudge MD, FACS Entered By: Christin Fudge on 02/21/2015 10:55:21 Philip [name] ([id number]) -------------------------------------------------------------------------------- Physician Orders Details Patient Name: Philip [name], Philip [name]. Date of Service: 02/21/2015 10:15 AM Medical Record Number: [id number] Patient Account Number: [id number] Date of Birth/Sex: 1919-04-09 (81 y.o. Male) Treating RN: Cornell Barman Primary Care Physician: Hortencia Pilar Other Clinician: Referring Physician: Hortencia Pilar Treating Physician/Extender: Frann Rider in Treatment: 42 Verbal / Phone Orders: Yes Clinician: Cornell Barman Read Back and Verified: Yes Diagnosis Coding Wound Cleansing Wound #4 Left,Medial Malleolus o Clean wound with Normal Saline. Wound #5R Right,Lateral Forearm o Clean wound with Normal Saline. Anesthetic Wound #4 Left,Medial Malleolus o Topical Lidocaine 4% cream applied to wound bed prior to debridement Wound #5R Right,Lateral Forearm o Topical Lidocaine 4% cream applied to wound bed prior to  debridement Primary Wound Dressing Wound #5R Right,Lateral  Forearm o petroleum gauze Secondary Dressing Wound #5R Right,Lateral Forearm o Conform/Kerlix - and coban Dressing Change Frequency Wound #4 Left,Medial Malleolus o Change dressing every week Wound #5R Right,Lateral Forearm o Change dressing every week Follow-up Appointments Wound #4 Left,Medial Malleolus o Return Appointment in 1 week. Wound #5R Right,Lateral Forearm o Return Appointment in 1 week. Philip [name], Philip [name] ([id number]) Edema Control o Other: - Kerlix and Coban from toes to knee Advanced Therapies Wound #4 Left,Medial Malleolus o Grafix Prime application in clinic; including contact layer, fixation with steri strips, dry gauze and cover dressing. Electronic Signature(s) Signed: 02/21/2015 12:00:49 PM By: Gretta Cool RN, BSN, Kim RN, BSN Signed: 02/21/2015 3:45:45 PM By: Christin Fudge MD, FACS Entered By: Gretta Cool RN, BSN, Kim on 02/21/2015 10:58:34 MERWYN, HODAPP ([id number]) -------------------------------------------------------------------------------- Problem List Details Patient Name: Philip [name], Philip [name]. Date of Service: 02/21/2015 10:15 AM Medical Record Number: [id number] Patient Account Number: [id number] Date of Birth/Sex: [birth year] (81 y.o. Male) Treating RN: Cornell Barman Primary Care Physician: Hortencia Pilar Other Clinician: Referring Physician: Hortencia Pilar Treating Physician/Extender: Frann Rider in Treatment: 43 Active Problems ICD-10 Encounter Code Description Active Date Diagnosis I70.232 Atherosclerosis of native arteries of right leg with 06/21/2014 Yes ulceration of calf I82.401 Acute embolism and thrombosis of unspecified deep veins 06/21/2014 Yes of right lower extremity S91.301A Unspecified open wound, right foot, initial encounter 07/19/2014 Yes Z92.21 Personal history of antineoplastic chemotherapy 08/23/2014 Yes L97.522 Non-pressure chronic ulcer of other part of left  foot with fat 09/20/2014 Yes layer exposed S41.111A Laceration without foreign body of right upper arm, initial 10/04/2014 Yes encounter Inactive Problems Resolved Problems ICD-10 Code Description Active Date Resolved Date L97.212 Non-pressure chronic ulcer of right calf with fat layer 06/21/2014 06/21/2014 exposed S51.812A Laceration without foreign body of left forearm, initial 06/21/2014 06/21/2014 encounter Philip [name], Philip [name] ([id number]) Electronic Signature(s) Signed: 02/21/2015 10:53:27 AM By: Christin Fudge MD, FACS Entered By: Christin Fudge on 02/21/2015 10:53:27 Philip [name] ([id number]) -------------------------------------------------------------------------------- Progress Note Details Patient Name: Philip [name]. Date of Service: 02/21/2015 10:15 AM Medical Record Number: [id number] Patient Account Number: [id number] Date of Birth/Sex: 09/25/1918 (81 y.o. Male) Treating RN: Cornell Barman Primary Care Physician: Hortencia Pilar Other Clinician: Referring Physician: Hortencia Pilar Treating Physician/Extender: Frann Rider in Treatment: 44 Subjective Chief Complaint Information obtained from Patient R foot ulcer. L forearm ulcer. 07/19/2014 -- about 2 weeks ago he had a surgical procedure done by dermatologist in [place] and has an open surgical wound on the dorsum of the right foot. History of Present Illness (HPI) The following HPI elements were documented for the patient's wound: Location: right leg Duration: Dec 2015 Modifying Factors: history of an injury to the right leg with resulting hematoma and thrombophlebitis and later an ulcer of posterior leg Associated Signs and Symptoms: marked lymphedema of the right leg. He is already on Eloquis. 06/14/14 -- He returns for followup today. He denies any fevers. no fresh issues and his daughter says he's been doing fine. 06/21/14 -- after he sustained a fall this week earlier he applied a bandage over this himself and did not  seek any medical attention. he did however manage to control the bleeding and had a dressing in place the next morning when his son to the visit. In this dressing was removed there was further damaged skin. His right leg has been doing fine otherwise. 07/12/14 --Very pleasant 81 year old with past medical history significant for congestive heart failure (EF 15%), peripheral vascular disease, and chronic kidney disease. He was hospitalized  at [facility] in December 2015 for congestive heart failure. He says that he fell during his hospital course and developed a hematoma over his right calf. He was also diagnosed with a right lower extremity DVT for which he takes Eliquis. The hematoma subsequently turned into an ulceration around Christmas, which has healed. He subsequently developed an ulcer on his right dorsal foot and a traumatic left forearm ulcer. Per his report, he underwent biopsy of the right dorsal foot ulceration which demonstrated a skin cancer. His PCP and dermatologist office are both closed today. I reviewed his records in [place] but find no report of biopsy or pathology. He s without complaints today. No significant pain. No fever or chills. Minimal drainage. 07/19/2014 - the patient and his son tell me that about 2 weeks ago the dermatologist did a skin biopsy and this was a large area on the dorsum of his right foot which was left open and no dressing instructions were recommended. Since then he has been called and told that it is a cancer and the need to do a further procedure but that will not happen until about 2 weeks from now. In the meanwhile the patient has not been taking care of his right foot. The left forearm where he had an abrasion and laceration is doing very well. Philip [name], Philip [name] ([id number]) 07/26/2014 -- Reports from 07/08/2014 from the dermatology group reviewed. A excision was done of a lesion located on the dorsum of the right foot and this was 1.7 cm in diameter  which was a shave biopsy performed. The wound was left open after appropriate cauterization and the patient was given this dressing instructions. 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 08/02/2014 -- all the original problems he came with have completely resolved. He now has a surgical wound on his right foot dorsum where a skin cancer was excised. He goes to see his dermatologist this coming Tuesday and will have definite news next Friday. 08/09/2014 he had gone to his dermatologist on Tuesday and she has injected the base of his ulcer with some chemotherapeutic agent. He was supposed to bring some papers with him but forgot to get them and will bring them in next week. Other than that the dermatologist had suggested using Mehdi honey on the wound. 08/16/2014 -- the patient has brought in his notes from the dermatologist and on 08/06/2014 he received a injection of 5 FU, 500 mg grams into the lesion. the pathology report was also sent and it was a squamous cell carcinoma well-differentiated and edges were involved. They wanted him to use many honey for the wound dressing changes to be done 3 times a week. 08/30/2014 -- he has finished his second injection of 5-FU and has the next one in 2 weeks' time. He is doing fine otherwise. 09/13/2014 - No new complaints. No significant pain. No fever or chills. Minimal drainage. Still receiving 5-FU injections. 09/20/2014 -- He was seen by the dermatologist on 09/10/2014 and Dr. Phillip Heal injected his foot both the right on the dorsum and left near the medial malleolus with 5-FU. The next dose of 5-FU is to be given after 3 months. The patient says he now has a spot on the left medial malleolus where he was injected with 5-FU. 09/27/2014 -- the area on the left ankle where he was injected with 5-FU is now a full-blown ulcer. He also has mild pain in both  ankle  areas. 10/04/2014 - large had a bit of a fall and injured his right arm last evening and has had a laceration with no evidence of any foreign body in the right forearm. 12/20/2014 -- he recently saw his dermatologist at [facility] and she was pleased with his wound healing on the right lower extremity. She has not given him any further 5-FU injections and will see him back on a when necessary basis. 01/03/2015 -- his skin substitute Grafix has been approved by his insurance and we will go ahead with this next week. 01/10/2015 -- he has his first application of Grafix today. he recently had a nightmare and injured his right forearm and had some abrasions. 01/13/2015 -- Iona Beard has developed significant swelling of the left calf with some tenderness of the calf. This was only noticed this morning. Addendum: The DVT study done in the hospital -- IMPRESSION:No evidence of deep venous thrombosis left lower extremity. The result was called into the patient's son who acknowledges the report and will bring the patient back on Friday Philip [name], Philip [name] ([id number]) 01/17/2015 -- he saw his PCP Dr. Hoy Morn and she has increases dosage of Lasix and his edema on the left lower extremity is looking much better. He is here for a second applications of Grafix 22/29/7989 -- he is overall doing very well his edema has come down significantly. He is here for his third application of grafix. 02/04/2015 -- he has no new issues and is here for his fourth application of grafix 21/19/4174 -- he is here for a wound review today and has no fresh issues. 02/21/2015 -- he has no new issues and is here for his fifth and last application of grafix. Objective Constitutional Pulse regular. Respirations normal and unlabored. Afebrile. Vitals Time Taken: 10:10 AM, Height: 69 in, Weight: 179 lbs, BMI: 26.4, Temperature: 97.6 F, Pulse: 63 bpm, Respiratory Rate: 18 breaths/min, Blood Pressure: 124/40 mmHg. General Notes: MD  notified of BP. Eyes Nonicteric. Reactive to light. Ears, Nose, Mouth, and Throat Lips, teeth, and gums WNL.Marland Kitchen Moist mucosa without lesions . Neck supple and nontender. No palpable supraclavicular or cervical adenopathy. Normal sized without goiter. Respiratory WNL. No retractions.. Cardiovascular Pedal Pulses WNL. No clubbing, cyanosis or edema. Lymphatic No adneopathy. No adenopathy. No adenopathy. Musculoskeletal Adexa without tenderness or enlargement.. Digits and nails w/o clubbing, cyanosis, infection, petechiae, ischemia, or inflammatory conditions.Marland Kitchen Psychiatric Judgement and insight Intact.. No evidence of depression, anxiety, or agitation.. General Notes: his right arm is healed and looks well except that he is got some tape burn at the edges of Philip [name], Philip [name]. ([id number]) where the dressing was. The left medial ankle has some granulation tissue and is on fascia and we will continue to use his grafix today. Integumentary (Hair, Skin) No suspicious lesions. No crepitus or fluctuance. No peri-wound warmth or erythema. No masses.. Wound #4 status is Open. Original cause of wound was Other Lesion. The wound is located on the Left,Medial Malleolus. The wound measures 2.5cm length x 2.5cm width x 0.4cm depth; 4.909cm^2 area and 1.963cm^3 volume. Wound #5R status is Open. Original cause of wound was Shear/Friction. The wound is located on the Right,Lateral Forearm. The wound measures 5cm length x 10cm width x 0.1cm depth; 39.27cm^2 area and 3.927cm^3 volume. 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 I82.401 - Acute embolism and thrombosis of unspecified deep veins of right lower extremity S91.301A - Unspecified open wound, right foot, initial encounter  Z92.21 - Personal history of antineoplastic chemotherapy L97.522 - Non-pressure chronic ulcer of other part of left foot with fat layer exposed S41.111A -  Laceration without foreign body of right upper arm, initial encounter On his right arm I have recommended a piece of Xeroform and Kerlix and Coban to be left intact for the week. On his left medial ankle we have applied grafix, bolstered in place under sterile technique and will put a Kerlix and Coban to reduce his edema. We will see him back next week. 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 A skin graft procedure using a bioengineered skin substitute/cellular or tissue based product was performed by Christin Fudge, MD. Grafix prime was applied and secured with Steri-Strips. 6 sq cm of product was utilized and 0 sq cm was wasted. Post Application, mepitel was applied. A Time Out was conducted prior to the start of the procedure. The Philip [name], Philip [name] ([id number]) procedure was tolerated well with a pain level of 0 throughout and a pain level of 0 following the procedure. Post procedure Diagnosis Wound #4: Same as Pre-Procedure . Plan Wound Cleansing: Wound #4 Left,Medial Malleolus: Clean wound with Normal Saline. Wound #5R Right,Lateral Forearm: Clean wound with Normal Saline. Anesthetic: Wound #4 Left,Medial Malleolus: Topical Lidocaine 4% cream applied to wound bed prior to debridement Wound #5R Right,Lateral Forearm: Topical Lidocaine 4% cream applied to wound bed prior to debridement Primary Wound Dressing: Wound #5R Right,Lateral Forearm: petroleum gauze Secondary Dressing: Wound #5R Right,Lateral Forearm: Conform/Kerlix - and coban Dressing Change Frequency: Wound #4 Left,Medial Malleolus: Change dressing every week Wound #5R Right,Lateral Forearm: Change dressing every week Follow-up Appointments: Wound #4 Left,Medial Malleolus: Return Appointment in 1 week. Wound #5R Right,Lateral Forearm: Return Appointment in 1 week. Edema Control: Other: - Kerlix and Coban from toes to knee Advanced Therapies: Wound #4 Left,Medial  Malleolus: Grafix Prime application in clinic; including contact layer, fixation with steri strips, dry gauze and cover dressing. DEMAURI, ADVINCULA ([id number]) On his right arm I have recommended a piece of Xeroform and Kerlix and Coban to be left intact for the week. On his left medial ankle we have applied grafix, bolstered in place under sterile technique and will put a Kerlix and Coban to reduce his edema. We will see him back next week. Electronic Signature(s) Signed: 02/21/2015 3:48:35 PM By: Christin Fudge MD, FACS Previous Signature: 02/21/2015 10:56:43 AM Version By: Christin Fudge MD, FACS Entered By: Christin Fudge on 02/21/2015 15:48:35 Philip [name] ([id number]) -------------------------------------------------------------------------------- SuperBill Details Patient Name: ARTURO, SOFRANKO. Date of Service: 02/21/2015 Medical Record Number: [id number] Patient Account Number: [id number] Date of Birth/Sex: [birth year] (81 y.o. 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 S41.111A Laceration without foreign body of right upper arm, initial encounter Facility Procedures The patient participates with Medicare or their insurance follows the Medicare Facility Guidelines: CPT4 Code Description Modifier Quantity [id number] (Facility Use Only) Grafix Prime 1 SQ CM 6 The patient participates with Medicare or their insurance follows the  Medicare Facility Guidelines: [id number] - SKIN  SUB GRAFT FACE/NK/HF/G 1 ICD-10 Description Diagnosis I70.232 Atherosclerosis of native arteries of right leg with ulceration of  calf L97.522 Non-pressure chronic ulcer of other part of left foot with fat layer exposed Z92.21 Personal history of antineoplastic chemotherapy Physician Procedures CPT4 Code Description: [id number] - WC PHYS SKIN SUB GRAFT FACE/NK/HF/G ICD-10 Description Diagnosis I70.232 Atherosclerosis of native arteries of right leg with ulcera L97.522 Non-pressure chronic ulcer of other part of left foot with Z92.21 Personal  history of antineoplastic chemotherapy Modifier: tion of cal fat layer e Quantity: 1 f xposed Electronic Signature(s) Signed: 02/21/2015 10:57:10 AM By: Christin Fudge MD, FACS Entered By: Christin Fudge on 02/21/2015 10:57:10 Philip [name] ([id number])

## 2015-02-28 ENCOUNTER — Encounter: Payer: Medicare Other | Admitting: Surgery

## 2015-02-28 DIAGNOSIS — L97522 Non-pressure chronic ulcer of other part of left foot with fat layer exposed: Secondary | ICD-10-CM | POA: Diagnosis not present

## 2015-03-01 NOTE — Progress Notes (Signed)
Philip [name], Philip [name] ([id number]) Visit Report for 02/28/2015 Arrival Information Details Patient Name: Philip [name], Philip [name]. Date of Service: 02/28/2015 9:30 AM Medical Record Number: [id number] Patient Account Number: [id number] Date of Birth/Sex: May 17, 1918 (81 y.o. Male) Treating RN: Cornell Barman Primary Care Physician: Hortencia Pilar Other Clinician: Referring Physician: Hortencia Pilar Treating Physician/Extender: Frann Rider in Treatment: 45 Visit Information History Since Last Visit Added or deleted any medications: No Patient Arrived: [name] Any new allergies or adverse reactions: No Arrival Time: 09:31 Had a fall or experienced change in No Accompanied By: caregiver activities of daily living that may affect Transfer Assistance: None risk of falls: Patient Identification Verified: Yes Signs or symptoms of abuse/neglect since last No Secondary Verification Process Yes visito Completed: Hospitalized since last visit: No Patient Requires Transmission- No Pain Present Now: No Based Precautions: Patient Has Alerts: Yes Patient Alerts: Patient on Blood Thinner No ABIs dt LE blood clots Electronic Signature(s) Signed: 02/28/2015 4:39:51 PM By: Gretta Cool, RN, BSN, Kim RN, BSN Entered By: Gretta Cool, RN, BSN, Kim on 02/28/2015 09:32:03 [name] ([id number]) -------------------------------------------------------------------------------- Clinic Level of Care Assessment Details Patient Name: Philip [name], Philip [name]. Date of Service: 02/28/2015 9:30 AM Medical Record Number: [id number] Patient Account Number: [id number] Date of Birth/Sex: 1919/02/25 (81 y.o. Male) Treating RN: Cornell Barman Primary Care Physician: Hortencia Pilar Other Clinician: Referring Physician: Hortencia Pilar Treating Physician/Extender: Frann Rider in Treatment: 23 Clinic Level of Care Assessment Items TOOL 4 Quantity Score []  - Use when only an EandM is performed on FOLLOW-UP visit 0 ASSESSMENTS -  Nursing Assessment / Reassessment []  - Reassessment of Co-morbidities (includes updates in patient status) 0 X - Reassessment of Adherence to Treatment Plan 1 5 ASSESSMENTS - Wound and Skin Assessment / Reassessment X - Simple Wound Assessment / Reassessment - one wound 1 5 []  - Complex Wound Assessment / Reassessment - multiple wounds 0 []  - Dermatologic / Skin Assessment (not related to wound area) 0 ASSESSMENTS - Focused Assessment []  - Circumferential Edema Measurements - multi extremities 0 []  - Nutritional Assessment / Counseling / Intervention 0 []  - Lower Extremity Assessment (monofilament, tuning fork, pulses) 0 []  - Peripheral Arterial Disease Assessment (using hand held doppler) 0 ASSESSMENTS - Ostomy and/or Continence Assessment and Care []  - Incontinence Assessment and Management 0 []  - Ostomy Care Assessment and Management (repouching, etc.) 0 PROCESS - Coordination of Care X - Simple Patient / Family Education for ongoing care 1 15 []  - Complex (extensive) Patient / Family Education for ongoing care 0 X - Staff obtains Programmer, systems, Records, Test Results / Process Orders 1 10 []  - Staff telephones HHA, Nursing Homes / Clarify orders / etc 0 []  - Routine Transfer to another Facility (non-emergent condition) 0 Philip [name], Philip [name] ([id number]) []  - Routine Hospital Admission (non-emergent condition) 0 []  - New Admissions / Biomedical engineer / Ordering NPWT, Apligraf, etc. 0 []  - Emergency Hospital Admission (emergent condition) 0 X - Simple Discharge Coordination 1 10 []  - Complex (extensive) Discharge Coordination 0 PROCESS - Special Needs []  - Pediatric / Minor Patient Management 0 []  - Isolation Patient Management 0 []  - Hearing / Language / Visual special needs 0 []  - Assessment of Community assistance (transportation, D/C planning, etc.) 0 []  - Additional assistance / Altered mentation 0 []  - Support Surface(s) Assessment (bed, cushion, seat, etc.) 0 INTERVENTIONS -  Wound Cleansing / Measurement []  - Simple Wound Cleansing - one wound 0 X - Complex Wound Cleansing - multiple wounds 2 5 X - Wound  Imaging (photographs - any number of wounds) 1 5 []  - Wound Tracing (instead of photographs) 0 []  - Simple Wound Measurement - one wound 0 X - Complex Wound Measurement - multiple wounds 2 5 INTERVENTIONS - Wound Dressings []  - Small Wound Dressing one or multiple wounds 0 X - Medium Wound Dressing one or multiple wounds 2 15 []  - Large Wound Dressing one or multiple wounds 0 []  - Application of Medications - topical 0 []  - Application of Medications - injection 0 INTERVENTIONS - Miscellaneous []  - External ear exam 0 Philip [name], Philip [name] ([id number]) []  - Specimen Collection (cultures, biopsies, blood, body fluids, etc.) 0 []  - Specimen(s) / Culture(s) sent or taken to Lab for analysis 0 []  - Patient Transfer (multiple staff / Civil Service fast streamer / Similar devices) 0 []  - Simple Staple / Suture removal (25 or less) 0 []  - Complex Staple / Suture removal (26 or more) 0 []  - Hypo / Hyperglycemic Management (close monitor of Blood Glucose) 0 []  - Ankle / Brachial Index (ABI) - do not check if billed separately 0 X - Vital Signs 1 5 Has the patient been seen at the hospital within the last three years: Yes Total Score: 105 Level Of Care: New/Established - Level 3 Electronic Signature(s) Signed: 02/28/2015 4:39:51 PM By: Gretta Cool, RN, BSN, Kim RN, BSN Entered By: Gretta Cool, RN, BSN, Kim on 02/28/2015 10:07:23 [name] ([id number]) -------------------------------------------------------------------------------- Encounter Discharge Information Details Patient Name: Philip [name], Philip [name]. Date of Service: 02/28/2015 9:30 AM Medical Record Number: [id number] Patient Account Number: [id number] Date of Birth/Sex: 1918-04-25 (81 y.o. Male) Treating RN: Montey Hora Primary Care Physician: Hortencia Pilar Other Clinician: Referring Physician: Hortencia Pilar Treating  Physician/Extender: Frann Rider in Treatment: 67 Encounter Discharge Information Items Discharge Pain Level: 0 Discharge Condition: Stable Ambulatory Status: Ambulatory Discharge Destination: Home Transportation: Private Auto Accompanied By: self Schedule Follow-up Appointment: Yes Medication Reconciliation completed and provided to Patient/Care Yes Tanee Henery: Provided on Clinical Summary of Care: 02/28/2015 Form Type Recipient Paper Patient GT Electronic Signature(s) Signed: 02/28/2015 4:39:51 PM By: Gretta Cool RN, BSN, Kim RN, BSN Previous Signature: 02/28/2015 10:07:40 AM Version By: Ruthine Dose Entered By: Gretta Cool RN, BSN, Kim on 02/28/2015 10:09:53 [name] ([id number]) -------------------------------------------------------------------------------- Lower Extremity Assessment Details Patient Name: Philip [name], Philip [name]. Date of Service: 02/28/2015 9:30 AM Medical Record Number: [id number] Patient Account Number: [id number] Date of Birth/Sex: June 20, 1918 (81 y.o. Male) Treating RN: Cornell Barman Primary Care Physician: Hortencia Pilar Other Clinician: Referring Physician: Hortencia Pilar Treating Physician/Extender: Frann Rider in Treatment: 29 Vascular Assessment Pulses: Posterior Tibial Dorsalis Pedis Palpable: [Left:Yes] Extremity colors, hair growth, and conditions: Extremity Color: [Left:Red] Hair Growth on Extremity: [Left:No] Temperature of Extremity: [Left:Warm] Capillary Refill: [Left:> 3 seconds] Toe Nail Assessment Left: Right: Thick: Yes Discolored: Yes Improper Length and Hygiene: Yes Electronic Signature(s) Signed: 02/28/2015 4:39:51 PM By: Gretta Cool, RN, BSN, Kim RN, BSN Entered By: Gretta Cool, RN, BSN, Kim on 02/28/2015 09:35:56 [name] ([id number]) -------------------------------------------------------------------------------- Multi Wound Chart Details Patient Name: Philip [name], Philip [name]. Date of Service: 02/28/2015 9:30 AM Medical Record  Number: [id number] Patient Account Number: [id number] Date of Birth/Sex: February 14, 1919 (81 y.o. Male) Treating RN: Cornell Barman Primary Care Physician: Hortencia Pilar Other Clinician: Referring Physician: Hortencia Pilar Treating Physician/Extender: Frann Rider in Treatment: 67 Vital Signs Height(in): 69 Pulse(bpm): 63 Weight(lbs): 179 Blood Pressure 147/108 (mmHg): Body Mass Index(BMI): 26 Temperature(F): 97.4 Respiratory Rate 18 (breaths/min): Photos: [4:No Photos] [5R:No Photos] [N/A:N/A] Wound Location: [4:Left Malleolus - Medial] [5R:Right, Lateral Forearm] [N/A:N/A]  Wounding Event: [4:Other Lesion] [5R:Shear/Friction] [N/A:N/A] Primary Etiology: [4:Malignant Wound] [5R:Skin Tear] [N/A:N/A] Comorbid History: [4:Cataracts, Arrhythmia, Congestive Heart Failure] [5R:N/A] [N/A:N/A] Date Acquired: [4:09/20/2014] [5R:09/22/2014] [N/A:N/A] Weeks of Treatment: [4:23] [5R:21] [N/A:N/A] Wound Status: [4:Open] [5R:Open] [N/A:N/A] Wound Recurrence: [4:No] [5R:Yes] [N/A:N/A] Measurements L x W x D 2.5x2.5x0.4 [5R:4x9x0.1] [N/A:N/A] (cm) Area (cm) : [id number] [5R:28.274] [N/A:N/A] Volume (cm) : [4:1.963] [5R:2.827] [N/A:N/A] % Reduction in Area: [4:-3026.80%] [5R:-350.00%] [N/A:N/A] % Reduction in Volume: -12168.70% [5R:-350.20%] [N/A:N/A] Classification: [4:Full Thickness With Exposed Support Structures] [5R:Partial Thickness] [N/A:N/A] Exudate Amount: [4:Large] [5R:N/A] [N/A:N/A] Exudate Type: [4:Serous] [5R:N/A] [N/A:N/A] Exudate Color: [4:amber] [5R:N/A] [N/A:N/A] Wound Margin: [4:Distinct, outline attached] [5R:N/A] [N/A:N/A] Granulation Amount: [4:None Present (0%)] [5R:N/A] [N/A:N/A] Necrotic Amount: [4:Large (67-100%)] [5R:N/A] [N/A:N/A] Exposed Structures: [4:Fascia: No Fat: No Tendon: No Muscle: No Joint: No] [5R:N/A] [N/A:N/A] Bone: No Limited to Skin Breakdown Periwound Skin Texture: No Abnormalities Noted No Abnormalities Noted N/A Periwound Skin No Abnormalities  Noted No Abnormalities Noted N/A Moisture: Periwound Skin Color: Erythema: Yes No Abnormalities Noted N/A Hemosiderin Staining: Yes Erythema Location: Circumferential N/A N/A Tenderness on No No N/A Palpation: Wound Preparation: Ulcer Cleansing: Not N/A N/A Cleansed Topical Anesthetic Applied: Other Treatment Notes Electronic Signature(s) Signed: 02/28/2015 4:39:51 PM By: Gretta Cool, RN, BSN, Kim RN, BSN Entered By: Gretta Cool, RN, BSN, Kim on 02/28/2015 09:50:16 [name] ([id number]) -------------------------------------------------------------------------------- [place] Details Patient Name: Philip [name], Philip [name]. Date of Service: 02/28/2015 9:30 AM Medical Record Number: [id number] Patient Account Number: [id number] Date of Birth/Sex: [birth year] (81 y.o. Male) Treating RN: Cornell Barman Primary Care Physician: Hortencia Pilar Other Clinician: Referring Physician: Hortencia Pilar Treating Physician/Extender: Frann Rider in Treatment: 49 Active Inactive Abuse / Safety / Falls / Self Care Management Nursing Diagnoses: Abuse or neglect; actual or potential Potential for falls Goals: Patient will remain injury free Date Initiated: 05/02/2014 Goal Status: Active Interventions: Assess fall risk on admission and as needed Notes: Necrotic Tissue Nursing Diagnoses: Impaired tissue integrity related to necrotic/devitalized tissue Goals: Necrotic/devitalized tissue will be minimized in the wound bed Date Initiated: 05/02/2014 Goal Status: Active Interventions: Assess patient pain level pre-, during and post procedure and prior to discharge Treatment Activities: Apply topical anesthetic as ordered : 02/28/2015 Notes: Orientation to the Wound Care Program Nursing Diagnoses: Knowledge deficit related to the wound healing center program Philip [name], Philip [name] ([id number]) Goals: Patient/caregiver will verbalize understanding of the [facility] Program Date  Initiated: 05/02/2014 Goal Status: Active Interventions: Provide education on orientation to the wound center Notes: Electronic Signature(s) Signed: 02/28/2015 4:39:51 PM By: Gretta Cool, RN, BSN, Kim RN, BSN Entered By: Gretta Cool, RN, BSN, Kim on 02/28/2015 09:49:53 [name] ([id number]) -------------------------------------------------------------------------------- Pain Assessment Details Patient Name: Philip [name], Philip [name]. Date of Service: 02/28/2015 9:30 AM Medical Record Number: [id number] Patient Account Number: [id number] Date of Birth/Sex: Apr 11, 1919 (81 y.o. Male) Treating RN: Cornell Barman Primary Care Physician: Hortencia Pilar Other Clinician: Referring Physician: Hortencia Pilar Treating Physician/Extender: Frann Rider in Treatment: 69 Active Problems Location of Pain Severity and Description of Pain Patient Has Paino No Site Locations Pain Management and Medication Current Pain Management: Electronic Signature(s) Signed: 02/28/2015 4:39:51 PM By: Gretta Cool, RN, BSN, Kim RN, BSN Entered By: Gretta Cool, RN, BSN, Kim on 02/28/2015 09:32:09 [name] ([id number]) -------------------------------------------------------------------------------- Patient/Caregiver Education Details Patient Name: Philip [name], Philip [name]. Date of Service: 02/28/2015 9:30 AM Medical Record Number: [id number] Patient Account Number: [id number] Date of Birth/Gender: Philip [name] 25, 1920 (81 y.o. Male) Treating RN: Cornell Barman Primary Care Physician: Hortencia Pilar Other Clinician: Referring Physician: Hortencia Pilar Treating  Physician/Extender: Frann Rider in Treatment: 40 Education Assessment Education Provided To: Patient Education Topics Provided Wound/Skin Impairment: Handouts: Caring for Your Ulcer, Other: wound care as prescreibed Methods: Demonstration, Explain/Verbal Responses: State content correctly Electronic Signature(s) Signed: 02/28/2015 4:39:51 PM By: Gretta Cool, RN, BSN, Kim RN, BSN Entered  By: Gretta Cool, RN, BSN, Kim on 02/28/2015 10:10:09 [name] ([id number]) -------------------------------------------------------------------------------- Wound Assessment Details Patient Name: Philip [name], Philip [name]. Date of Service: 02/28/2015 9:30 AM Medical Record Number: [id number] Patient Account Number: [id number] Date of Birth/Sex: Dec 19, 1918 (81 y.o. Male) Treating RN: Cornell Barman Primary Care Physician: Hortencia Pilar Other Clinician: Referring Physician: Hortencia Pilar Treating Physician/Extender: Frann Rider in Treatment: 14 Wound Status Wound Number: 4 Primary Malignant Wound Etiology: Wound Location: Left Malleolus - Medial Wound Status: Open Wounding Event: Other Lesion Comorbid Cataracts, Arrhythmia, Congestive Date Acquired: 09/20/2014 History: Heart Failure Weeks Of Treatment: 23 Clustered Wound: No Photos Photo Uploaded By: Gretta Cool, RN, BSN, Kim on 02/28/2015 11:10:14 Wound Measurements Length: (cm) 2.5 Width: (cm) 2.5 Depth: (cm) 0.4 Area: (cm) 4.909 Volume: (cm) 1.963 % Reduction in Area: -3026.8% % Reduction in Volume: -12168.7% Wound Description Full Thickness With Exposed Classification: Support Structures Wound Margin: Distinct, outline attached Exudate Large Amount: Exudate Type: Serous Exudate Color: amber Wound Bed Granulation Amount: None Present (0%) Exposed Structure Necrotic Amount: Large (67-100%) Fascia Exposed: No Necrotic Quality: Adherent Slough Fat Layer Exposed: No AURTHER, MCQUILLIN ([id number]) Tendon Exposed: No Muscle Exposed: No Joint Exposed: No Bone Exposed: No Limited to Skin Breakdown Periwound Skin Texture Texture Color No Abnormalities Noted: No No Abnormalities Noted: No Erythema: Yes Moisture Erythema Location: Circumferential No Abnormalities Noted: No Hemosiderin Staining: Yes Wound Preparation Ulcer Cleansing: Not Cleansed Topical Anesthetic Applied: Other Treatment Notes Wound #4 (Left, Medial  Malleolus) 1. Cleansed with: Clean wound with Normal Saline 2. Anesthetic Topical Lidocaine 4% cream to wound bed prior to debridement 4. Dressing Applied: Other dressing (specify in notes) Notes siltec, foam, Kerlix and Coban from base of toe to knee Electronic Signature(s) Signed: 02/28/2015 4:39:51 PM By: Gretta Cool, RN, BSN, Kim RN, BSN Entered By: Gretta Cool, RN, BSN, Kim on 02/28/2015 09:47:03 [name] ([id number]) -------------------------------------------------------------------------------- Wound Assessment Details Patient Name: BRANKO, ALLOR. Date of Service: 02/28/2015 9:30 AM Medical Record Number: [id number] Patient Account Number: [id number] Date of Birth/Sex: 18-Apr-1919 (81 y.o. Male) Treating RN: Cornell Barman Primary Care Physician: Hortencia Pilar Other Clinician: Referring Physician: Hortencia Pilar Treating Physician/Extender: Frann Rider in Treatment: 22 Wound Status Wound Number: 5R Primary Etiology: Skin Tear Wound Location: Right, Lateral Forearm Wound Status: Open Wounding Event: Shear/Friction Date Acquired: 09/22/2014 Weeks Of Treatment: 21 Clustered Wound: No Photos Photo Uploaded By: Gretta Cool, RN, BSN, Kim on 02/28/2015 11:10:17 Wound Measurements Length: (cm) 4 Width: (cm) 9 Depth: (cm) 0.1 Area: (cm) 28.274 Volume: (cm) 2.827 % Reduction in Area: -350% % Reduction in Volume: -350.2% Wound Description Classification: Partial Thickness Periwound Skin Texture Texture Color No Abnormalities Noted: No No Abnormalities Noted: No BRETTON, MAYNE. ([id number]) Moisture No Abnormalities Noted: No Treatment Notes Wound #5R (Right, Lateral Forearm) 1. Cleansed with: Clean wound with Normal Saline 2. Anesthetic Topical Lidocaine 4% cream to wound bed prior to debridement 4. Dressing Applied: Other dressing (specify in notes) Notes Event organiser) Signed: 02/28/2015 4:39:51 PM By: Gretta Cool, RN, BSN, Kim RN, BSN Entered By:  Gretta Cool, RN, BSN, Kim on 02/28/2015 09:43:09 [name] ([id number]) -------------------------------------------------------------------------------- [place] Details Patient Name: KYLO, OMELIA. Date of Service: 02/28/2015 9:30 AM Medical Record Number: [id number] Patient  Account Number: [id number] Date of Birth/Sex: 08/23/1918 (81 y.o. Male) Treating RN: Cornell Barman Primary Care Physician: Hortencia Pilar Other Clinician: Referring Physician: Hortencia Pilar Treating Physician/Extender: Frann Rider in Treatment: 35 Vital Signs Time Taken: 09:32 Temperature (F): 97.4 Height (in): 69 Pulse (bpm): 63 Weight (lbs): 179 Respiratory Rate (breaths/min): 18 Body Mass Index (BMI): 26.4 Blood Pressure (mmHg): 147/108 Reference Range: 80 - 120 mg / dl Electronic Signature(s) Signed: 02/28/2015 4:39:51 PM By: Gretta Cool, RN, BSN, Kim RN, BSN Entered By: Gretta Cool, RN, BSN, Kim on 02/28/2015 [id number]

## 2015-03-01 NOTE — Progress Notes (Addendum)
ATWELL, RUSSELLO ([id number]) Visit Report for 02/28/2015 Chief Complaint Document Details Patient Name: Philip [name], Philip [name]. Date of Service: 02/28/2015 9:30 AM Medical Record Number: [id number] Patient Account Number: [id number] Date of Birth/Sex: 1919/02/28 (81 y.o. Male) Treating RN: Montey Hora Primary Care Physician: Hortencia Pilar Other Clinician: Referring Physician: Hortencia Pilar Treating Physician/Extender: Frann Rider in Treatment: 6 Information Obtained from: Patient Chief Complaint R foot ulcer. L forearm ulcer. 07/19/2014 -- about 2 weeks ago he had a surgical procedure done by dermatologist in [place] and has an open surgical wound on the dorsum of the right foot. Electronic Signature(s) Signed: 02/28/2015 9:54:35 AM By: Christin Fudge MD, FACS Entered By: Christin Fudge on 02/28/2015 09:54:35 [name] ([id number]) -------------------------------------------------------------------------------- HPI Details Patient Name: Philip [name]. Date of Service: 02/28/2015 9:30 AM Medical Record Number: [id number] Patient Account Number: [id number] Date of Birth/Sex: [birth year] (81 y.o. Male) Treating RN: Montey Hora Primary Care Physician: Hortencia Pilar Other Clinician: Referring Physician: Hortencia Pilar Treating Physician/Extender: Frann Rider in Treatment: 25 History of Present Illness Location: right leg Duration: Dec 2015 Modifying Factors: history of an injury to the right leg with resulting hematoma and thrombophlebitis and later an ulcer of posterior leg Associated Signs and Symptoms: marked lymphedema of the right leg. He is already on Eloquis. HPI Description: 06/14/14 -- He returns for followup today. He denies any fevers. no fresh issues and his daughter says he's been doing fine. 06/21/14 -- after he sustained a fall this week earlier he applied a bandage over this himself and did not seek any medical attention. he did however manage  to control the bleeding and had a dressing in place the next morning when his son to the visit. In this dressing was removed there was further damaged skin. His right leg has been doing fine otherwise. 07/12/14 --Very pleasant 81 year old with past medical history significant for congestive heart failure (EF 15%), peripheral vascular disease, and chronic kidney disease. He was hospitalized at [facility] in December 2015 for congestive heart failure. He says that he fell during his hospital course and developed a hematoma over his right calf. He was also diagnosed with a right lower extremity DVT for which he takes Eliquis. The hematoma subsequently turned into an ulceration around Christmas, which has healed. He subsequently developed an ulcer on his right dorsal foot and a traumatic left forearm ulcer. Per his report, he underwent biopsy of the right dorsal foot ulceration which demonstrated a skin cancer. His PCP and dermatologist office are both closed today. I reviewed his records in [place] but find no report of biopsy or pathology. He s without complaints today. No significant pain. No fever or chills. Minimal drainage. 07/19/2014 - the patient and his son tell me that about 2 weeks ago the dermatologist did a skin biopsy and this was a large area on the dorsum of his right foot which was left open and no dressing instructions were recommended. Since then he has been called and told that it is a cancer and the need to do a further procedure but that will not happen until about 2 weeks from now. In the meanwhile the patient has not been taking care of his right foot. The left forearm where he had an abrasion and laceration is doing very well. 07/26/2014 -- Reports from 07/08/2014 from the dermatology group reviewed. A excision was done of a lesion located on the dorsum of the right foot and this was 1.7 cm in diameter which was a shave  biopsy performed. The wound was left open after appropriate  cauterization and the patient was given this dressing instructions. 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 08/02/2014 -- all the original problems he came with have completely resolved. He now has a surgical wound on his right foot dorsum where a skin cancer was excised. He goes to see his dermatologist this Philip [name] ([id number]) coming Tuesday and will have definite news next Friday. 08/09/2014 he had gone to his dermatologist on Tuesday and she has injected the base of his ulcer with some chemotherapeutic agent. He was supposed to bring some papers with him but forgot to get them and will bring them in next week. Other than that the dermatologist had suggested using Mehdi honey on the wound. 08/16/2014 -- the patient has brought in his notes from the dermatologist and on 08/06/2014 he received a injection of 5 FU, 500 mg grams into the lesion. the pathology report was also sent and it was a squamous cell carcinoma well-differentiated and edges were involved. They wanted him to use many honey for the wound dressing changes to be done 3 times a week. 08/30/2014 -- he has finished his second injection of 5-FU and has the next one in 2 weeks' time. He is doing fine otherwise. 09/13/2014 - No new complaints. No significant pain. No fever or chills. Minimal drainage. Still receiving 5-FU injections. 09/20/2014 -- He was seen by the dermatologist on 09/10/2014 and Dr. Phillip Heal injected his foot both the right on the dorsum and left near the medial malleolus with 5-FU. The next dose of 5-FU is to be given after 3 months. The patient says he now has a spot on the left medial malleolus where he was injected with 5-FU. 09/27/2014 -- the area on the left ankle where he was injected with 5-FU is now a full-blown ulcer. He also has mild pain in both ankle areas. 10/04/2014 - large had a bit of a fall and  injured his right arm last evening and has had a laceration with no evidence of any foreign body in the right forearm. 12/20/2014 -- he recently saw his dermatologist at [facility] and she was pleased with his wound healing on the right lower extremity. She has not given him any further 5-FU injections and will see him back on a when necessary basis. 01/03/2015 -- his skin substitute Grafix has been approved by his insurance and we will go ahead with this next week. 01/10/2015 -- he has his first application of Grafix today. he recently had a nightmare and injured his right forearm and had some abrasions. 01/13/2015 -- Iona Beard has developed significant swelling of the left calf with some tenderness of the calf. This was only noticed this morning. Addendum: The DVT study done in the hospital -- IMPRESSION:No evidence of deep venous thrombosis left lower extremity. The result was called into the patient's son who acknowledges the report and will bring the patient back on Friday 01/17/2015 -- he saw his PCP Dr. Hoy Morn and she has increases dosage of Lasix and his edema on the left lower extremity is looking much better. He is here for a second applications of Grafix [id number] -- he is overall doing very well his edema has come down significantly. He is here for his third application of grafix. 02/04/2015 -- he has no new issues and is here for his fourth application of grafix [id number] -- he is here  for a wound review today and has no fresh issues. 02/21/2015 -- he has no new issues and is here for his fifth and last application of grafix. ESA, SANDERLIN ([id number]) Electronic Signature(s) Signed: 02/28/2015 9:54:40 AM By: Christin Fudge MD, FACS Entered By: Christin Fudge on 02/28/2015 09:54:40 MARCANGELO, HEMMING ([id number]) -------------------------------------------------------------------------------- Physical Exam Details Patient Name: EULICES, HUTCHERSON. Date of Service: 02/28/2015 9:30  AM Medical Record Number: [id number] Patient Account Number: [id number] Date of Birth/Sex: June 11, 1918 (81 y.o. Male) Treating RN: Montey Hora Primary Care Physician: Hortencia Pilar Other Clinician: Referring Physician: Hortencia Pilar Treating Physician/Extender: Frann Rider in Treatment: 58 Constitutional . Pulse regular. Respirations normal and unlabored. Afebrile. . Eyes Nonicteric. Reactive to light. Ears, Nose, Mouth, and Throat Lips, teeth, and gums WNL.Marland Kitchen Moist mucosa without lesions . Neck supple and nontender. No palpable supraclavicular or cervical adenopathy. Normal sized without goiter. Respiratory WNL. No retractions.. Cardiovascular Pedal Pulses WNL. No clubbing, cyanosis or edema. Chest Breasts symmetical and no nipple discharge.. Breast tissue WNL, no masses, lumps, or tenderness.. Lymphatic No adneopathy. No adenopathy. No adenopathy. Musculoskeletal Adexa without tenderness or enlargement.. Digits and nails w/o clubbing, cyanosis, infection, petechiae, ischemia, or inflammatory conditions.. Integumentary (Hair, Skin) No suspicious lesions. No crepitus or fluctuance. No peri-wound warmth or erythema. No masses.Marland Kitchen Psychiatric Judgement and insight Intact.. No evidence of depression, anxiety, or agitation.. Notes his right arm is looking much better and there was no maceration and there are 2 areas which are open and there is a good amount of healed epithelium in-between. The left medial ankle has some granulation tissue and the fascia still exposed. No debridement is required today. Electronic Signature(s) Signed: 02/28/2015 9:55:27 AM By: Christin Fudge MD, FACS Entered By: Christin Fudge on 02/28/2015 09:55:27 [name] ([id number]) -------------------------------------------------------------------------------- Physician Orders Details Patient Name: ALPER, NOEL. Date of Service: 02/28/2015 9:30 AM Medical Record Number: [id number] Patient  Account Number: [id number] Date of Birth/Sex: July 27, 1918 (81 y.o. Male) Treating RN: Cornell Barman Primary Care Physician: Hortencia Pilar Other Clinician: Referring Physician: Hortencia Pilar Treating Physician/Extender: Frann Rider in Treatment: 34 Verbal / Phone Orders: Yes Clinician: Cornell Barman Read Back and Verified: Yes Diagnosis Coding Wound Cleansing Wound #4 Left,Medial Malleolus o Clean wound with Normal Saline. Wound #5R Right,Lateral Forearm o Clean wound with Normal Saline. Anesthetic Wound #4 Left,Medial Malleolus o Topical Lidocaine 4% cream applied to wound bed prior to debridement Wound #5R Right,Lateral Forearm o Topical Lidocaine 4% cream applied to wound bed prior to debridement Skin Barriers/Peri-Wound Care Wound #4 Left,Medial Malleolus o Moisturizing lotion Primary Wound Dressing Wound #4 Left,Medial Malleolus o Other: - sorbact Wound #5R Right,Lateral Forearm o Other: - hydroctive B and coban Secondary Dressing Wound #4 Left,Medial Malleolus o Foam Dressing Change Frequency Wound #4 Left,Medial Malleolus o Three times weekly Wound #5R Right,Lateral Forearm o Change dressing every week GORDAN, DORRANCE ([id number]) Follow-up Appointments Wound #4 Left,Medial Malleolus o Return Appointment in 1 week. Wound #5R Right,Lateral Forearm o Return Appointment in 1 week. Edema Control Wound #4 Left,Medial Malleolus o Other: - Kerlix and Coban from toes to knee Home Health Wound #4 [place] for [place] Nurse may visit PRN to address patientos wound care needs. o FACE TO FACE ENCOUNTER: MEDICARE and MEDICAID PATIENTS: I certify that this patient is under my care and that I had a face-to-face encounter that meets the physician face-to-face encounter requirements with this patient on this date. The encounter with the patient  was in whole or in part for the  following MEDICAL CONDITION: (primary reason for [place]) MEDICAL NECESSITY: I certify, that based on my findings, NURSING services are a medically necessary home health service. HOME BOUND STATUS: I certify that my clinical findings support that this patient is homebound (i.e., Due to illness or injury, pt requires aid of supportive devices such as crutches, cane, wheelchairs, walkers, the use of special transportation or the assistance of another person to leave their place of residence. There is a normal inability to leave the home and doing so requires considerable and taxing effort. Other absences are for medical reasons / religious services and are infrequent or of short duration when for other reasons). o If current dressing causes regression in wound condition, may D/C ordered dressing product/s and apply Normal Saline Moist Dressing daily until next [place] / Other MD appointment. [place] of regression in wound condition at [phone]. o Please direct any NON-WOUND related issues/requests for orders to patient's Primary Care Physician Wound #5R Right,Lateral Forearm o [place] for [place] Nurse may visit PRN to address patientos wound care needs. o FACE TO FACE ENCOUNTER: MEDICARE and MEDICAID PATIENTS: I certify that this patient is under my care and that I had a face-to-face encounter that meets the physician face-to-face encounter requirements with this patient on this date. The encounter with the patient was in whole or in part for the following MEDICAL CONDITION: (primary reason for [place]) MEDICAL NECESSITY: I certify, that based on my findings, NURSING services are a medically necessary home health service. HOME BOUND STATUS: I certify that my clinical findings support that this patient is homebound (i.e., Due to illness or injury, pt requires aid of supportive devices such as  crutches, cane, wheelchairs, walkers, the use of special transportation or the assistance of another person to leave their place of residence. There is a normal inability to leave the home and doing so requires considerable and taxing effort. Other absences are for medical reasons / religious services and are infrequent or of short duration when for other reasons). SHAUNDELL, LEDFORD ([id number]) o If current dressing causes regression in wound condition, may D/C ordered dressing product/s and apply Normal Saline Moist Dressing daily until next [place] / Other MD appointment. [place] of regression in wound condition at [phone]. o Please direct any NON-WOUND related issues/requests for orders to patient's Primary Care Physician Electronic Signature(s) Signed: 03/04/2015 4:35:06 PM By: Christin Fudge MD, FACS Signed: 03/04/2015 5:36:16 PM By: Gretta Cool RN, BSN, Kim RN, BSN Previous Signature: 02/28/2015 4:13:02 PM Version By: Christin Fudge MD, FACS Previous Signature: 02/28/2015 4:39:51 PM Version By: Gretta Cool RN, BSN, Kim RN, BSN Entered By: Gretta Cool, RN, BSN, Kim on 03/04/2015 10:34:37 [name] ([id number]) -------------------------------------------------------------------------------- Problem List Details Patient Name: FORD, MATKOWSKI. Date of Service: 02/28/2015 9:30 AM Medical Record Number: [id number] Patient Account Number: [id number] Date of Birth/Sex: 06/27/1918 (81 y.o. Male) Treating RN: Montey Hora Primary Care Physician: Hortencia Pilar Other Clinician: Referring Physician: Hortencia Pilar Treating Physician/Extender: Frann Rider in Treatment: 69 Active Problems ICD-10 Encounter Code Description Active Date Diagnosis I70.232 Atherosclerosis of native arteries of right leg with 06/21/2014 Yes ulceration of calf I82.401 Acute embolism and thrombosis of unspecified deep veins 06/21/2014 Yes of right lower extremity S91.301A  Unspecified open wound, right foot, initial encounter 07/19/2014 Yes Z92.21 Personal history of antineoplastic chemotherapy 08/23/2014 Yes L97.522 Non-pressure chronic ulcer of other  part of left foot with fat 09/20/2014 Yes layer exposed S41.111A Laceration without foreign body of right upper arm, initial 10/04/2014 Yes encounter Inactive Problems Resolved Problems ICD-10 Code Description Active Date Resolved Date L97.212 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 Date of Service: 02/28/2015 9:30 AM Medical Record Number: [id number] Patient Account Number: [id number] Date of Birth/Sex: 1919-01-11 (81 y.o. Male) Treating RN: Montey Hora Primary Care Physician: Hortencia Pilar Other Clinician: Referring Physician: Hortencia Pilar Treating Physician/Extender: Frann Rider in Treatment: 79 Subjective Chief Complaint Information obtained from Patient R foot ulcer. L forearm ulcer. 07/19/2014 -- about 2 weeks ago he had a surgical procedure done by dermatologist in [place] and has an open surgical wound on the dorsum of the right foot. History of Present Illness (HPI) The following HPI elements were documented for the patient's wound: Location: right leg Duration: Dec 2015 Modifying Factors: history of an injury to the right leg with resulting hematoma and thrombophlebitis and later an ulcer of posterior leg Associated Signs and Symptoms: marked lymphedema of the right leg. He is already on Eloquis. 06/14/14 -- He returns for followup  today. He denies any fevers. no fresh issues and his daughter says he's been doing fine. 06/21/14 -- after he sustained a fall this week earlier he applied a bandage over this himself and did not seek any medical attention. he did however manage to control the bleeding and had a dressing in place the next morning when his son to the visit. In this dressing was removed there was further damaged skin. His right leg has been doing fine otherwise. 07/12/14 --Very pleasant 81 year old with past medical history significant for congestive heart failure (EF 15%), peripheral vascular disease, and chronic kidney disease. He was hospitalized at [facility] in December 2015 for congestive heart failure. He says that he fell during his hospital course and developed a hematoma over his right calf. He was also diagnosed with a right lower extremity DVT for which he takes Eliquis. The hematoma subsequently turned into an ulceration around Christmas, which has healed. He subsequently developed an ulcer on his right dorsal foot and a traumatic left forearm ulcer. Per his report, he underwent biopsy of the right dorsal foot ulceration which demonstrated a skin cancer. His PCP and dermatologist office are both closed today. I reviewed his records in [place] but find no report of biopsy or pathology. He s without complaints today. No significant pain. No fever or chills. Minimal drainage. 07/19/2014 - the patient and his son tell me that about 2 weeks ago the dermatologist did a skin biopsy and this was a large area on the dorsum of his right foot which was left open and no dressing instructions were recommended. Since then he has been called and told that it is a cancer and the need to do a further procedure but that will not happen until about 2 weeks from now. In the meanwhile the patient has not been taking care of his right foot. The left forearm where he had an abrasion and laceration is doing very well. ZACHERIE, LAGUNES  ([id number]) 07/26/2014 -- Reports from 07/08/2014 from the dermatology group reviewed. A excision was done of a lesion located  on the dorsum of the right foot and this was 1.7 cm in diameter which was a shave biopsy performed. The wound was left open after appropriate cauterization and the patient was given this dressing instructions. 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 08/02/2014 -- all the original problems he came with have completely resolved. He now has a surgical wound on his right foot dorsum where a skin cancer was excised. He goes to see his dermatologist this coming Tuesday and will have definite news next Friday. 08/09/2014 he had gone to his dermatologist on Tuesday and she has injected the base of his ulcer with some chemotherapeutic agent. He was supposed to bring some papers with him but forgot to get them and will bring them in next week. Other than that the dermatologist had suggested using Mehdi honey on the wound. 08/16/2014 -- the patient has brought in his notes from the dermatologist and on 08/06/2014 he received a injection of 5 FU, 500 mg grams into the lesion. the pathology report was also sent and it was a squamous cell carcinoma well-differentiated and edges were involved. They wanted him to use many honey for the wound dressing changes to be done 3 times a week. 08/30/2014 -- he has finished his second injection of 5-FU and has the next one in 2 weeks' time. He is doing fine otherwise. 09/13/2014 - No new complaints. No significant pain. No fever or chills. Minimal drainage. Still receiving 5-FU injections. 09/20/2014 -- He was seen by the dermatologist on 09/10/2014 and Dr. Phillip Heal injected his foot both the right on the dorsum and left near the medial malleolus with 5-FU. The next dose of 5-FU is to be given after 3 months. The patient says he now has a spot on the  left medial malleolus where he was injected with 5-FU. 09/27/2014 -- the area on the left ankle where he was injected with 5-FU is now a full-blown ulcer. He also has mild pain in both ankle areas. 10/04/2014 - large had a bit of a fall and injured his right arm last evening and has had a laceration with no evidence of any foreign body in the right forearm. 12/20/2014 -- he recently saw his dermatologist at [facility] and she was pleased with his wound healing on the right lower extremity. She has not given him any further 5-FU injections and will see him back on a when necessary basis. 01/03/2015 -- his skin substitute Grafix has been approved by his insurance and we will go ahead with this next week. 01/10/2015 -- he has his first application of Grafix today. he recently had a nightmare and injured his right forearm and had some abrasions. 01/13/2015 -- Iona Beard has developed significant swelling of the left calf with some tenderness of the calf. This was only noticed this morning. Addendum: The DVT study done in the hospital -- IMPRESSION:No evidence of deep venous thrombosis left lower extremity. The result was called into the patient's son who acknowledges the report and will bring the patient back on Friday NICHLOUS, HORNUNG ([id number]) 01/17/2015 -- he saw his PCP Dr. Hoy Morn and she has increases dosage of Lasix and his edema on the left lower extremity is looking much better. He is here for a second applications of Grafix [id number] -- he is overall doing very well his edema has come down significantly. He is here for his third application of grafix. 02/04/2015 -- he has  no new issues and is here for his fourth application of grafix [id number] -- he is here for a wound review today and has no fresh issues. 02/21/2015 -- he has no new issues and is here for his fifth and last application of grafix. Objective Constitutional Pulse regular. Respirations normal and unlabored.  Afebrile. Vitals Time Taken: 9:32 AM, Height: 69 in, Weight: 179 lbs, BMI: 26.4, Temperature: 97.4 F, Pulse: 63 bpm, Respiratory Rate: 18 breaths/min, Blood Pressure: 147/108 mmHg. Eyes Nonicteric. Reactive to light. Ears, Nose, Mouth, and Throat Lips, teeth, and gums WNL.Marland Kitchen Moist mucosa without lesions . Neck supple and nontender. No palpable supraclavicular or cervical adenopathy. Normal sized without goiter. Respiratory WNL. No retractions.. Cardiovascular Pedal Pulses WNL. No clubbing, cyanosis or edema. Chest Breasts symmetical and no nipple discharge.. Breast tissue WNL, no masses, lumps, or tenderness.. Lymphatic No adneopathy. No adenopathy. No adenopathy. Musculoskeletal Adexa without tenderness or enlargement.. Digits and nails w/o clubbing, cyanosis, infection, petechiae, ischemia, or inflammatory conditions.Marland Kitchen Psychiatric Judgement and insight Intact.. No evidence of depression, anxiety, or agitation.[name] ([id number]) General Notes: his right arm is looking much better and there was no maceration and there are 2 areas which are open and there is a good amount of healed epithelium in-between. The left medial ankle has some granulation tissue and the fascia still exposed. No debridement is required today. Integumentary (Hair, Skin) No suspicious lesions. No crepitus or fluctuance. No peri-wound warmth or erythema. No masses.. Wound #4 status is Open. Original cause of wound was Other Lesion. The wound is located on the Left,Medial Malleolus. The wound measures 2.5cm length x 2.5cm width x 0.4cm depth; 4.909cm^2 area and 1.963cm^3 volume. The wound is limited to skin breakdown. There is a large amount of serous drainage noted. The wound margin is distinct with the outline attached to the wound base. There is no granulation within the wound bed. There is a large (67-100%) amount of necrotic tissue within the wound bed including Adherent Slough. The periwound skin  appearance exhibited: Hemosiderin Staining, Erythema. The surrounding wound skin color is noted with erythema which is circumferential. Wound #5R status is Open. Original cause of wound was Shear/Friction. The wound is located on the Right,Lateral Forearm. The wound measures 4cm length x 9cm width x 0.1cm depth; 28.274cm^2 area and 2.827cm^3 volume. 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 On his left medial ankle we will use Sorbact and foam and will put a Kerlix and Coban to reduce his edema. This will be changed by home health and we will get this done 3 times a week. We will see him back next week. Plan KEDARIUS, VERBA ([id number]) Wound Cleansing: Wound #4 Left,Medial Malleolus: Clean wound with Normal Saline. Wound #5R Right,Lateral Forearm: Clean wound with Normal Saline. Anesthetic: Wound #4 Left,Medial Malleolus: Topical Lidocaine 4% cream applied to wound bed prior to debridement Wound #5R Right,Lateral Forearm: Topical Lidocaine 4% cream applied to wound bed prior to debridement Skin Barriers/Peri-Wound Care: Wound #4  Left,Medial Malleolus: Moisturizing lotion Primary Wound Dressing: Wound #4 Left,Medial Malleolus: Other: - sorbact Wound #5R Right,Lateral Forearm: Other: - hydroctive B and coban Secondary Dressing: Wound #4 Left,Medial Malleolus: Foam Dressing Change Frequency: Wound #4 Left,Medial  Malleolus: Three times weekly Wound #5R Right,Lateral Forearm: Change dressing every week Follow-up Appointments: Wound #4 Left,Medial Malleolus: Return Appointment in 1 week. Wound #5R Right,Lateral Forearm: Return Appointment in 1 week. Edema Control: Wound #4 Left,Medial Malleolus: Other: - Kerlix and Coban from toes to knee Home Health: Wound #4 Left,Medial Malleolus: [place] [facility] [place] Nurse may visit PRN to address patient s wound care needs. FACE TO FACE ENCOUNTER: MEDICARE and MEDICAID PATIENTS: I certify that this patient is under my care and that I had a face-to-face encounter that meets the physician face-to-face encounter requirements with this patient on this date. The encounter with the patient was in whole or in part for the following MEDICAL CONDITION: (primary reason for [place]) MEDICAL NECESSITY: I certify, that based on my findings, NURSING services are a medically necessary home health service. HOME BOUND STATUS: I certify that my clinical findings support that this patient is homebound (i.e., Due to illness or injury, pt requires aid of supportive devices such as crutches, cane, wheelchairs, walkers, the use of special transportation or the assistance of another person to leave their place of residence. There is a normal inability to leave the home and doing so requires considerable and taxing effort. Other absences are for medical reasons / religious services and are infrequent or of short duration when for other reasons). If current dressing causes regression in wound condition, may D/C ordered dressing product/s and apply Normal Saline Moist Dressing daily until next [facility] / Other MD appointment. Notify Wound WYNSTON, KALAFUT ([id number]) [place] of regression in wound condition at [phone]. Please direct any NON-WOUND related issues/requests for orders to patient's Primary Care Physician Wound  #5R Right,Lateral Forearm: [place] for [place] Nurse may visit PRN to address patient s wound care needs. FACE TO FACE ENCOUNTER: MEDICARE and MEDICAID PATIENTS: I certify that this patient is under my care and that I had a face-to-face encounter that meets the physician face-to-face encounter requirements with this patient on this date. The encounter with the patient was in whole or in part for the following MEDICAL CONDITION: (primary reason for [place]) MEDICAL NECESSITY: I certify, that based on my findings, NURSING services are a medically necessary home health service. HOME BOUND STATUS: I certify that my clinical findings support that this patient is homebound (i.e., Due to illness or injury, pt requires aid of supportive devices such as crutches, cane, wheelchairs, walkers, the use of special transportation or the assistance of another person to leave their place of residence. There is a normal inability to leave the home and doing so requires considerable and taxing effort. Other absences are for medical reasons / religious services and are infrequent or of short duration when for other reasons). If current dressing causes regression in wound condition, may D/C ordered dressing product/s and apply Normal Saline Moist Dressing daily until next [place] / Other MD appointment. [place] of regression in wound condition at [phone]. Please direct any NON-WOUND related issues/requests for orders to patient's Primary Care Physician On his right arm I have recommended a piece of Hydoactive B Kerlix and Coban to be left intact for the week. On his left medial ankle we will use  Sorbact and foam and will put a Kerlix and Coban to reduce his edema. This will be changed by home health and we will get this done 3 times a week. We will see him back next week. Electronic Signature(s) Signed: 03/04/2015 4:41:18 PM By: Christin Fudge MD, FACS Previous Signature: 02/28/2015 9:57:34 AM Version By: Christin Fudge MD, FACS Entered By: Christin Fudge on 03/04/2015 16:41:18 [name] ([id number]) -------------------------------------------------------------------------------- SuperBill Details Patient Name: CHADEN, BATTIS. Date of Service: 02/28/2015 Medical Record Number: [id number] Patient Account Number: [id number] Date of Birth/Sex: Jun 01, 1918 (81 y.o. 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 S41.111A Laceration without foreign body of right upper arm, initial encounter Facility Procedures The patient participates with Medicare or their insurance follows the Medicare Facility Guidelines: CPT4 Code Description Modifier Quantity [id number] [place] VISIT-LEV 3 EST PT 1 Physician Procedures CPT4: Description Modifier Quantity Code [id number] - WC PHYS LEVEL 3 - EST PT 1 ICD-10 Description Diagnosis I70.232 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 I82.401 Acute embolism and thrombosis of unspecified deep  veins of right lower extremity S91.301A Unspecified open wound, right foot, initial encounter L97.522 Non-pressure chronic ulcer of other part of left foot with fat layer exposed Electronic Signature(s) Signed: 02/28/2015 4:13:02 PM By: Christin Fudge MD, FACS Signed: 02/28/2015 4:39:51 PM By: Gretta Cool RN, BSN,  Kim RN, BSN Previous Signature: 02/28/2015 9:57:50 AM Version By: Christin Fudge MD, FACS Entered By: Gretta Cool, RN, BSN, Kim on 02/28/2015 10:07:36

## 2015-03-07 ENCOUNTER — Encounter: Payer: Medicare Other | Admitting: Surgery

## 2015-03-07 DIAGNOSIS — L97522 Non-pressure chronic ulcer of other part of left foot with fat layer exposed: Secondary | ICD-10-CM | POA: Diagnosis not present

## 2015-03-09 NOTE — Progress Notes (Signed)
KHIZER, TOEWS ([id number]) Visit Report for 03/07/2015 Chief Complaint Document Details Patient Name: Philip [name], Philip [name]. Date of Service: 03/07/2015 9:30 AM Medical Record Number: [id number] Patient Account Number: [id number] Date of Birth/Sex: 11-Oct-1918 (81 y.o. Male) Treating RN: Montey Hora Primary Care Physician: Hortencia Pilar Other Clinician: Referring Physician: Hortencia Pilar Treating Physician/Extender: Frann Rider in Treatment: 23 Information Obtained from: Patient Chief Complaint R foot ulcer. L forearm ulcer. 07/19/2014 -- about 2 weeks ago he had a surgical procedure done by dermatologist in [place] and has an open surgical wound on the dorsum of the right foot. Electronic Signature(s) Signed: 03/07/2015 10:34:32 AM By: Christin Fudge MD, FACS Entered By: Christin Fudge on 03/07/2015 10:34:32 [name] ([id number]) -------------------------------------------------------------------------------- HPI Details Patient Name: Philip [name]. Date of Service: 03/07/2015 9:30 AM Medical Record Number: [id number] Patient Account Number: [id number] Date of Birth/Sex: 1918/06/25 (81 y.o. Male) Treating RN: Montey Hora Primary Care Physician: Hortencia Pilar Other Clinician: Referring Physician: Hortencia Pilar Treating Physician/Extender: Frann Rider in Treatment: 74 History of Present Illness Location: right leg Duration: Dec 2015 Modifying Factors: history of an injury to the right leg with resulting hematoma and thrombophlebitis and later an ulcer of posterior leg Associated Signs and Symptoms: marked lymphedema of the right leg. He is already on Eloquis. HPI Description: 06/14/14 -- He returns for followup today. He denies any fevers. no fresh issues and his daughter says he's been doing fine. 06/21/14 -- after he sustained a fall this week earlier he applied a bandage over this himself and did not seek any medical attention. he did however manage  to control the bleeding and had a dressing in place the next morning when his son to the visit. In this dressing was removed there was further damaged skin. His right leg has been doing fine otherwise. 07/12/14 --Very pleasant 81 year old with past medical history significant for congestive heart failure (EF 15%), peripheral vascular disease, and chronic kidney disease. He was hospitalized at [facility] in December 2015 for congestive heart failure. He says that he fell during his hospital course and developed a hematoma over his right calf. He was also diagnosed with a right lower extremity DVT for which he takes Eliquis. The hematoma subsequently turned into an ulceration around Christmas, which has healed. He subsequently developed an ulcer on his right dorsal foot and a traumatic left forearm ulcer. Per his report, he underwent biopsy of the right dorsal foot ulceration which demonstrated a skin cancer. His PCP and dermatologist office are both closed today. I reviewed his records in [place] but find no report of biopsy or pathology. He s without complaints today. No significant pain. No fever or chills. Minimal drainage. 07/19/2014 - the patient and his son tell me that about 2 weeks ago the dermatologist did a skin biopsy and this was a large area on the dorsum of his right foot which was left open and no dressing instructions were recommended. Since then he has been called and told that it is a cancer and the need to do a further procedure but that will not happen until about 2 weeks from now. In the meanwhile the patient has not been taking care of his right foot. The left forearm where he had an abrasion and laceration is doing very well. 07/26/2014 -- Reports from 07/08/2014 from the dermatology group reviewed. A excision was done of a lesion located on the dorsum of the right foot and this was 1.7 cm in diameter which was a shave  biopsy performed. The wound was left open after appropriate  cauterization and the patient was given this dressing instructions. 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 08/02/2014 -- all the original problems he came with have completely resolved. He now has a surgical wound on his right foot dorsum where a skin cancer was excised. He goes to see his dermatologist this Philip, [name] ([id number]) coming Tuesday and will have definite news next Friday. 08/09/2014 he had gone to his dermatologist on Tuesday and she has injected the base of his ulcer with some chemotherapeutic agent. He was supposed to bring some papers with him but forgot to get them and will bring them in next week. Other than that the dermatologist had suggested using Mehdi honey on the wound. 08/16/2014 -- the patient has brought in his notes from the dermatologist and on 08/06/2014 he received a injection of 5 FU, 500 mg grams into the lesion. the pathology report was also sent and it was a squamous cell carcinoma well-differentiated and edges were involved. They wanted him to use many honey for the wound dressing changes to be done 3 times a week. 08/30/2014 -- he has finished his second injection of 5-FU and has the next one in 2 weeks' time. He is doing fine otherwise. 09/13/2014 - No new complaints. No significant pain. No fever or chills. Minimal drainage. Still receiving 5-FU injections. 09/20/2014 -- He was seen by the dermatologist on 09/10/2014 and Dr. Phillip Heal injected his foot both the right on the dorsum and left near the medial malleolus with 5-FU. The next dose of 5-FU is to be given after 3 months. The patient says he now has a spot on the left medial malleolus where he was injected with 5-FU. 09/27/2014 -- the area on the left ankle where he was injected with 5-FU is now a full-blown ulcer. He also has mild pain in both ankle areas. 10/04/2014 - large had a bit of a fall and  injured his right arm last evening and has had a laceration with no evidence of any foreign body in the right forearm. 12/20/2014 -- he recently saw his dermatologist at [facility] and she was pleased with his wound healing on the right lower extremity. She has not given him any further 5-FU injections and will see him back on a when necessary basis. 01/03/2015 -- his skin substitute Grafix has been approved by his insurance and we will go ahead with this next week. 01/10/2015 -- he has his first application of Grafix today. he recently had a nightmare and injured his right forearm and had some abrasions. 01/13/2015 -- Iona Beard has developed significant swelling of the left calf with some tenderness of the calf. This was only noticed this morning. Addendum: The DVT study done in the hospital -- IMPRESSION:No evidence of deep venous thrombosis left lower extremity. The result was called into the patient's son who acknowledges the report and will bring the patient back on Friday 01/17/2015 -- he saw his PCP Dr. Hoy Morn and she has increases dosage of Lasix and his edema on the left lower extremity is looking much better. He is here for a second applications of Grafix [id number] -- he is overall doing very well his edema has come down significantly. He is here for his third application of grafix. 02/04/2015 -- he has no new issues and is here for his fourth application of grafix [id number] -- he is here  for a wound review today and has no fresh issues. 02/21/2015 -- he has no new issues and is here for his fifth and last application of grafix. HUU, DOLL ([id number]) Electronic Signature(s) Signed: 03/07/2015 10:34:40 AM By: Christin Fudge MD, FACS Entered By: Christin Fudge on 03/07/2015 10:34:40 AZARIAS, HOWSE ([id number]) -------------------------------------------------------------------------------- Physical Exam Details Patient Name: ADIYAN, STOGDILL. Date of Service: 03/07/2015 9:30  AM Medical Record Number: [id number] Patient Account Number: [id number] Date of Birth/Sex: 03-14-1919 (81 y.o. Male) Treating RN: Montey Hora Primary Care Physician: Hortencia Pilar Other Clinician: Referring Physician: Hortencia Pilar Treating Physician/Extender: Frann Rider in Treatment: 81 Constitutional . Pulse regular. Respirations normal and unlabored. Afebrile. . Eyes Nonicteric. Reactive to light. Ears, Nose, Mouth, and Throat Lips, teeth, and gums WNL.Marland Kitchen Moist mucosa without lesions . Neck supple and nontender. No palpable supraclavicular or cervical adenopathy. Normal sized without goiter. Respiratory WNL. No retractions.. Cardiovascular Pedal Pulses WNL. No clubbing, cyanosis or edema. Lymphatic No adneopathy. No adenopathy. No adenopathy. Musculoskeletal Adexa without tenderness or enlargement.. Digits and nails w/o clubbing, cyanosis, infection, petechiae, ischemia, or inflammatory conditions.. Integumentary (Hair, Skin) No suspicious lesions. No crepitus or fluctuance. No peri-wound warmth or erythema. No masses.Marland Kitchen Psychiatric Judgement and insight Intact.. No evidence of depression, anxiety, or agitation.. Notes the right arm is looking much better and there is no maceration a couple of areas which are open. The left medial ankle has some granulation tissue and there is some fascia which is exposed but there is no surrounding cellulitis. Electronic Signature(s) Signed: 03/07/2015 10:35:11 AM By: Christin Fudge MD, FACS Entered By: Christin Fudge on 03/07/2015 10:35:11 [name] ([id number]) -------------------------------------------------------------------------------- Physician Orders Details Patient Name: RODD, POLAN. Date of Service: 03/07/2015 9:30 AM Medical Record Number: [id number] Patient Account Number: [id number] Date of Birth/Sex: [birth year] (81 y.o. Male) Treating RN: Montey Hora Primary Care Physician: Hortencia Pilar Other  Clinician: Referring Physician: Hortencia Pilar Treating Physician/Extender: Frann Rider in Treatment: 4 Verbal / Phone Orders: Yes Clinician: Montey Hora Read Back and Verified: Yes Diagnosis Coding Wound Cleansing Wound #4 Left,Medial Malleolus o Clean wound with Normal Saline. Wound #5R Right,Lateral Forearm o Clean wound with Normal Saline. Anesthetic Wound #4 Left,Medial Malleolus o Topical Lidocaine 4% cream applied to wound bed prior to debridement Wound #5R Right,Lateral Forearm o Topical Lidocaine 4% cream applied to wound bed prior to debridement Skin Barriers/Peri-Wound Care Wound #4 Left,Medial Malleolus o Moisturizing lotion Primary Wound Dressing Wound #4 Left,Medial Malleolus o Other: - sorbact Wound #5R Right,Lateral Forearm o Other: - hydroctive B and coban Secondary Dressing Wound #4 Left,Medial Malleolus o Foam Dressing Change Frequency Wound #4 Left,Medial Malleolus o Three times weekly Wound #5R Right,Lateral Forearm o Change dressing every week COLBEN, DENNO ([id number]) Follow-up Appointments Wound #4 Left,Medial Malleolus o Return Appointment in 2 weeks. Wound #5R Right,Lateral Forearm o Return Appointment in 2 weeks. Edema Control Wound #4 Left,Medial Malleolus o Other: - Kerlix and Coban from toes to knee [place] #4 [place] Visits - [place] to visit patient Monday, Wednesday and Friday week of 03/10/15 and Monday and Wednesday week of 03/17/15 o Home Health Nurse may visit PRN to address patientos wound care needs. o FACE TO FACE ENCOUNTER: MEDICARE and MEDICAID PATIENTS: I certify that this patient is under my care and that I had a face-to-face encounter that meets the physician face-to-face encounter requirements with this patient on this date. The encounter with the patient was in whole  or in part for the following MEDICAL CONDITION:  (primary reason for Home Healthcare) MEDICAL NECESSITY: I certify, that based on my findings, NURSING services are a medically necessary home health service. HOME BOUND STATUS: I certify that my clinical findings support that this patient is homebound (i.e., Due to illness or injury, pt requires aid of supportive devices such as crutches, cane, wheelchairs, walkers, the use of special transportation or the assistance of another person to leave their place of residence. There is a normal inability to leave the home and doing so requires considerable and taxing effort. Other absences are for medical reasons / religious services and are infrequent or of short duration when for other reasons). o If current dressing causes regression in wound condition, may D/C ordered dressing product/s and apply Normal Saline Moist Dressing daily until next [place] / Other MD appointment. [place] of regression in wound condition at [phone]. o Please direct any NON-WOUND related issues/requests for orders to patient's Primary Care Physician Wound #5R Right,Lateral Forearm o [place] Visits - [place] to visit patient Monday, Wednesday and Friday week of 03/10/15 and Monday and Wednesday week of 03/17/15 o Home Health Nurse may visit PRN to address patientos wound care needs. o FACE TO FACE ENCOUNTER: MEDICARE and MEDICAID PATIENTS: I certify that this patient is under my care and that I had a face-to-face encounter that meets the physician face-to-face encounter requirements with this patient on this date. The encounter with the patient was in whole or in part for the following MEDICAL CONDITION: (primary reason for [place]) MEDICAL NECESSITY: I certify, that based on my findings, NURSING services are a medically necessary home health service. HOME BOUND STATUS: I certify that my clinical findings support that this patient is homebound  (i.e., Due to illness or injury, pt requires aid of supportive devices such as crutches, cane, wheelchairs, walkers, the use of special transportation or the assistance of another person to leave their place of residence. There is a normal inability to leave the home and doing so requires considerable and taxing effort. MATTIS, PERL ([id number]) absences are for medical reasons / religious services and are infrequent or of short duration when for other reasons). o If current dressing causes regression in wound condition, may D/C ordered dressing product/s and apply Normal Saline Moist Dressing daily until next [place] / Other MD appointment. [place] of regression in wound condition at [phone]. o Please direct any NON-WOUND related issues/requests for orders to patient's Primary Care Physician Services and Therapies o Arterial Studies- Bilateral oooo o Venous Studies -Bilateral oooo Electronic Signature(s) Signed: 03/07/2015 12:01:54 PM By: Montey Hora Signed: 03/07/2015 4:42:27 PM By: Christin Fudge MD, FACS Entered By: Montey Hora on 03/07/2015 12:01:53 [name] ([id number]) -------------------------------------------------------------------------------- Problem List Details Patient Name: KESHAN, TUREAUD. Date of Service: 03/07/2015 9:30 AM Medical Record Number: [id number] Patient Account Number: [id number] Date of Birth/Sex: 10-Aug-1918 (81 y.o. Male) Treating RN: Montey Hora Primary Care Physician: Hortencia Pilar Other Clinician: Referring Physician: Hortencia Pilar Treating Physician/Extender: Frann Rider in Treatment: 37 Active Problems ICD-10 Encounter Code Description Active Date Diagnosis I70.232 Atherosclerosis of native arteries of right leg with 06/21/2014 Yes ulceration of calf I82.401 Acute embolism and thrombosis of unspecified deep veins 06/21/2014 Yes of right lower extremity S91.301A  Unspecified open wound, right foot, initial encounter 07/19/2014 Yes Z92.21 Personal history of antineoplastic chemotherapy 08/23/2014 Yes L97.522 Non-pressure chronic ulcer of other part  of left foot with fat 09/20/2014 Yes layer exposed S41.111A Laceration without foreign body of right upper arm, initial 10/04/2014 Yes encounter Inactive Problems Resolved Problems ICD-10 Code Description Active Date Resolved Date L97.212 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 Date of Service: 03/07/2015 9:30 AM Medical Record Number: [id number] Patient Account Number: [id number] Date of Birth/Sex: 06-05-1918 (81 y.o. Male) Treating RN: Montey Hora Primary Care Physician: Hortencia Pilar Other Clinician: Referring Physician: Hortencia Pilar Treating Physician/Extender: Frann Rider in Treatment: 18 Subjective Chief Complaint Information obtained from Patient R foot ulcer. L forearm ulcer. 07/19/2014 -- about 2 weeks ago he had a surgical procedure done by dermatologist in [place] and has an open surgical wound on the dorsum of the right foot. History of Present Illness (HPI) The following HPI elements were documented for the patient's wound: Location: right leg Duration: Dec 2015 Modifying Factors: history of an injury to the right leg with resulting hematoma and thrombophlebitis and later an ulcer of posterior leg Associated Signs and Symptoms: marked lymphedema of the right leg. He is already on Eloquis. 06/14/14 -- He returns for followup  today. He denies any fevers. no fresh issues and his daughter says he's been doing fine. 06/21/14 -- after he sustained a fall this week earlier he applied a bandage over this himself and did not seek any medical attention. he did however manage to control the bleeding and had a dressing in place the next morning when his son to the visit. In this dressing was removed there was further damaged skin. His right leg has been doing fine otherwise. 07/12/14 --Very pleasant 81 year old with past medical history significant for congestive heart failure (EF 15%), peripheral vascular disease, and chronic kidney disease. He was hospitalized at [facility] in December 2015 for congestive heart failure. He says that he fell during his hospital course and developed a hematoma over his right calf. He was also diagnosed with a right lower extremity DVT for which he takes Eliquis. The hematoma subsequently turned into an ulceration around Christmas, which has healed. He subsequently developed an ulcer on his right dorsal foot and a traumatic left forearm ulcer. Per his report, he underwent biopsy of the right dorsal foot ulceration which demonstrated a skin cancer. His PCP and dermatologist office are both closed today. I reviewed his records in [place] but find no report of biopsy or pathology. He s without complaints today. No significant pain. No fever or chills. Minimal drainage. 07/19/2014 - the patient and his son tell me that about 2 weeks ago the dermatologist did a skin biopsy and this was a large area on the dorsum of his right foot which was left open and no dressing instructions were recommended. Since then he has been called and told that it is a cancer and the need to do a further procedure but that will not happen until about 2 weeks from now. In the meanwhile the patient has not been taking care of his right foot. The left forearm where he had an abrasion and laceration is doing very well. JOAS, SCIULLO  ([id number]) 07/26/2014 -- Reports from 07/08/2014 from the dermatology group reviewed. A excision was done of a lesion located on  the dorsum of the right foot and this was 1.7 cm in diameter which was a shave biopsy performed. The wound was left open after appropriate cauterization and the patient was given this dressing instructions. 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 08/02/2014 -- all the original problems he came with have completely resolved. He now has a surgical wound on his right foot dorsum where a skin cancer was excised. He goes to see his dermatologist this coming Tuesday and will have definite news next Friday. 08/09/2014 he had gone to his dermatologist on Tuesday and she has injected the base of his ulcer with some chemotherapeutic agent. He was supposed to bring some papers with him but forgot to get them and will bring them in next week. Other than that the dermatologist had suggested using Mehdi honey on the wound. 08/16/2014 -- the patient has brought in his notes from the dermatologist and on 08/06/2014 he received a injection of 5 FU, 500 mg grams into the lesion. the pathology report was also sent and it was a squamous cell carcinoma well-differentiated and edges were involved. They wanted him to use many honey for the wound dressing changes to be done 3 times a week. 08/30/2014 -- he has finished his second injection of 5-FU and has the next one in 2 weeks' time. He is doing fine otherwise. 09/13/2014 - No new complaints. No significant pain. No fever or chills. Minimal drainage. Still receiving 5-FU injections. 09/20/2014 -- He was seen by the dermatologist on 09/10/2014 and Dr. Phillip Heal injected his foot both the right on the dorsum and left near the medial malleolus with 5-FU. The next dose of 5-FU is to be given after 3 months. The patient says he now has a spot on the  left medial malleolus where he was injected with 5-FU. 09/27/2014 -- the area on the left ankle where he was injected with 5-FU is now a full-blown ulcer. He also has mild pain in both ankle areas. 10/04/2014 - large had a bit of a fall and injured his right arm last evening and has had a laceration with no evidence of any foreign body in the right forearm. 12/20/2014 -- he recently saw his dermatologist at [facility] and she was pleased with his wound healing on the right lower extremity. She has not given him any further 5-FU injections and will see him back on a when necessary basis. 01/03/2015 -- his skin substitute Grafix has been approved by his insurance and we will go ahead with this next week. 01/10/2015 -- he has his first application of Grafix today. he recently had a nightmare and injured his right forearm and had some abrasions. 01/13/2015 -- Iona Beard has developed significant swelling of the left calf with some tenderness of the calf. This was only noticed this morning. Addendum: The DVT study done in the hospital -- IMPRESSION:No evidence of deep venous thrombosis left lower extremity. The result was called into the patient's son who acknowledges the report and will bring the patient back on Friday ELIZJAH, SIEMEN ([id number]) 01/17/2015 -- he saw his PCP Dr. Hoy Morn and she has increases dosage of Lasix and his edema on the left lower extremity is looking much better. He is here for a second applications of Grafix [id number] -- he is overall doing very well his edema has come down significantly. He is here for his third application of grafix. 02/04/2015 -- he has no  new issues and is here for his fourth application of grafix [id number] -- he is here for a wound review today and has no fresh issues. 02/21/2015 -- he has no new issues and is here for his fifth and last application of grafix. Objective Constitutional Pulse regular. Respirations normal and unlabored.  Afebrile. Vitals Time Taken: 9:42 AM, Height: 69 in, Weight: 179 lbs, BMI: 26.4, Temperature: 97.6 F, Pulse: 71 bpm, Respiratory Rate: 18 breaths/min, Blood Pressure: 135/53 mmHg. Eyes Nonicteric. Reactive to light. Ears, Nose, Mouth, and Throat Lips, teeth, and gums WNL.Marland Kitchen Moist mucosa without lesions . Neck supple and nontender. No palpable supraclavicular or cervical adenopathy. Normal sized without goiter. Respiratory WNL. No retractions.. Cardiovascular Pedal Pulses WNL. No clubbing, cyanosis or edema. Lymphatic No adneopathy. No adenopathy. No adenopathy. Musculoskeletal Adexa without tenderness or enlargement.. Digits and nails w/o clubbing, cyanosis, infection, petechiae, ischemia, or inflammatory conditions.Marland Kitchen Psychiatric Judgement and insight Intact.. No evidence of depression, anxiety, or agitation.. General Notes: the right arm is looking much better and there is no maceration a couple of areas which are open. The left medial ankle has some granulation tissue and there is some fascia which is exposed but INAKI, SKYLES. ([id number]) there is no surrounding cellulitis. Integumentary (Hair, Skin) No suspicious lesions. No crepitus or fluctuance. No peri-wound warmth or erythema. No masses.. Wound #4 status is Open. Original cause of wound was Other Lesion. The wound is located on the Left,Medial Malleolus. The wound measures 2.5cm length x 2.5cm width x 0.3cm depth; 4.909cm^2 area and 1.473cm^3 volume. The wound is limited to skin breakdown. There is no tunneling or undermining noted. There is a large amount of serous drainage noted. The wound margin is distinct with the outline attached to the wound base. There is no granulation within the wound bed. There is a large (67-100%) amount of necrotic tissue within the wound bed including Adherent Slough. The periwound skin appearance exhibited: Hemosiderin Staining, Erythema. The surrounding wound skin color is noted with  erythema which is circumferential. Wound #5R status is Open. Original cause of wound was Shear/Friction. The wound is located on the Right,Lateral Forearm. The wound measures 5cm length x 13cm width x 0.1cm depth; 51.051cm^2 area and 5.105cm^3 volume. The wound is limited to skin breakdown. There is no tunneling or undermining noted. There is a medium amount of serosanguineous drainage noted. The wound margin is flat and intact. There is large (67-100%) red, pink granulation within the wound bed. There is no necrotic tissue within the wound bed. The periwound skin appearance did not exhibit: Callus, Crepitus, Excoriation, Fluctuance, Friable, Induration, Localized Edema, Rash, Scarring, Dry/Scaly, Maceration, Moist, Atrophie Blanche, Cyanosis, Ecchymosis, Hemosiderin Staining, Mottled, Pallor, Rubor, Erythema. 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 This may help establish his wound etiology  and explain why his wound healing has been so slow. In case there is an arterial issue to be fixed we can for him appropriately.[name] ([id number]) On his right arm I have recommended a piece of Hydoactive B Kerlix and Coban to be left intact for the week. On his  left medial ankle we will use Sorbact and foam and will put a Kerlix and Coban to reduce his edema. This will be changed by home health and we will get this done 3 times a week. We will see him back in 2 weeks time because of the holiday. Plan Wound Cleansing: Wound #4 Left,Medial Malleolus: Clean wound with Normal Saline. Wound #5R Right,Lateral Forearm: Clean wound with Normal Saline. Anesthetic: Wound #4 Left,Medial Malleolus: Topical Lidocaine 4% cream applied to wound bed prior to debridement Wound #5R Right,Lateral Forearm: Topical Lidocaine 4% cream applied to wound bed prior to debridement Skin Barriers/Peri-Wound Care: Wound #4 Left,Medial Malleolus: Moisturizing lotion Primary Wound Dressing: Wound #4 Left,Medial Malleolus: Other: - sorbact Wound #5R Right,Lateral Forearm: Other: - hydroctive B and coban Secondary Dressing: Wound #4 Left,Medial Malleolus: Foam Dressing Change Frequency: Wound #4 Left,Medial Malleolus: Three times weekly Wound #5R Right,Lateral Forearm: Change dressing every week Follow-up Appointments: Wound #4 Left,Medial Malleolus: Return Appointment in 2 weeks. Wound #5R Right,Lateral Forearm: Return Appointment in 2 weeks. Edema Control: Wound #4 Left,Medial Malleolus: Other: - Kerlix and Coban from toes to knee Home Health: Wound #4 Left,Medial Malleolus: [place] to visit patient Monday, Wednesday and Friday week of 03/10/15 and Monday and Wednesday week of 03/17/15 Home Health Nurse may visit PRN to address patient s wound care needs. JAYZEON, WHOOLERY ([id number]) FACE TO FACE ENCOUNTER: MEDICARE and MEDICAID PATIENTS: I certify that this patient is under my care and that I had a face-to-face encounter that meets the physician face-to-face encounter requirements with this patient on this date. The encounter with the patient was in whole or in part for the following MEDICAL CONDITION: (primary reason  for [place]) MEDICAL NECESSITY: I certify, that based on my findings, NURSING services are a medically necessary home health service. HOME BOUND STATUS: I certify that my clinical findings support that this patient is homebound (i.e., Due to illness or injury, pt requires aid of supportive devices such as crutches, cane, wheelchairs, walkers, the use of special transportation or the assistance of another person to leave their place of residence. There is a normal inability to leave the home and doing so requires considerable and taxing effort. Other absences are for medical reasons / religious services and are infrequent or of short duration when for other reasons). If current dressing causes regression in wound condition, may D/C ordered dressing product/s and apply Normal Saline Moist Dressing daily until next [place] / Other MD appointment. [place] of regression in wound condition at [phone]. Please direct any NON-WOUND related issues/requests for orders to patient's Primary Care Physician Wound #5R Right,Lateral Forearm: [place] to visit patient Monday, Wednesday and Friday week of 03/10/15 and Monday and Wednesday week of 03/17/15 Home Health Nurse may visit PRN to address patient s wound care needs. FACE TO FACE ENCOUNTER: MEDICARE and MEDICAID PATIENTS: I certify that this patient is under my care and that I had a face-to-face encounter that meets the physician face-to-face encounter requirements with this patient on this date. The encounter with the patient was in whole or in part  for the following MEDICAL CONDITION: (primary reason for Home Healthcare) MEDICAL NECESSITY: I certify, that based on my findings, NURSING services are a medically necessary home health service. HOME BOUND STATUS: I certify that my clinical findings support that this patient is homebound (i.e., Due to illness or injury, pt  requires aid of supportive devices such as crutches, cane, wheelchairs, walkers, the use of special transportation or the assistance of another person to leave their place of residence. There is a normal inability to leave the home and doing so requires considerable and taxing effort. Other absences are for medical reasons / religious services and are infrequent or of short duration when for other reasons). If current dressing causes regression in wound condition, may D/C ordered dressing product/s and apply Normal Saline Moist Dressing daily until next [place] / Other MD appointment. [place] of regression in wound condition at [phone]. Please direct any NON-WOUND related issues/requests for orders to patient's Primary Care Physician Services and Therapies ordered were: Arterial Studies- Bilateral, Venous Studies -Bilateral Though the patient has a history of DVT and we've not been able to get a proper ABI on him due to the slow healing of this ulcer on the medial part of the left ankle I have recommended we get an arterial and venous duplex study done for him to look at the vascularity of his limbs. This may help establish his wound etiology and explain why his wound healing has been so slow. In case there is an arterial issue to be fixed we can for him appropriately.. On his right arm I have recommended a piece of Hydoactive B Kerlix and Coban to be left intact for the week. CALIX, MCNEISH ([id number]) On his left medial ankle we will use Sorbact and foam and will put a Kerlix and Coban to reduce his edema. This will be changed by home health and we will get this done 3 times a week. We will see him back in 2 weeks time because of the holiday. Electronic Signature(s) Signed: 03/07/2015 4:40:10 PM By: Christin Fudge MD, FACS Previous Signature: 03/07/2015 10:38:05 AM Version By: Christin Fudge MD, FACS Entered By: Christin Fudge on 03/07/2015  16:40:10 [name] ([id number]) -------------------------------------------------------------------------------- SuperBill Details Patient Name: MYRLE, NEWBAN[place]. Date of Service: 03/07/2015 Medical Record Number: [id number] Patient Account Number: [id number] Date of Birth/Sex: April 29, 1918 (81 y.o. 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 I82.401 Acute embolism and thrombosis of unspecified deep veins of right lower extremity S91.301A Unspecified open wound, right foot, initial encounter Z92.21 Personal history of antineoplastic chemotherapy L97.522 Non-pressure chronic ulcer of other part of left foot with fat layer exposed S41.111A Laceration without foreign body of right upper arm, initial encounter Facility Procedures The patient participates with Medicare or their insurance follows the Medicare Facility Guidelines: CPT4 Code Description Modifier Quantity [id number] [place] VISIT-LEV 3 EST PT 1 Physician Procedures CPT4: Description Modifier Quantity Code [id number] - WC PHYS LEVEL 3 - EST PT 1 ICD-10 Description Diagnosis I70.232 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

## 2015-03-09 NOTE — Progress Notes (Signed)
HURON, DEPAULIS ([id number]) Visit Report for 03/07/2015 Arrival Information Details Patient Name: Philip [name], Philip [name]. Date of Service: 03/07/2015 9:30 AM Medical Record Number: [id number] Patient Account Number: [id number] Date of Birth/Sex: 1918-04-23 (81 y.o. Male) Treating RN: Montey Hora Primary Care Physician: Hortencia Pilar Other Clinician: Referring Physician: Hortencia Pilar Treating Physician/Extender: Frann Rider in Treatment: 41 Visit Information History Since Last Visit Added or deleted any medications: No Patient Arrived: Walker Any new allergies or adverse reactions: No Arrival Time: 09:41 Had a fall or experienced change in No Accompanied By: son activities of daily living that may affect Transfer Assistance: None risk of falls: Patient Identification Verified: Yes Signs or symptoms of abuse/neglect since last No Secondary Verification Process Yes visito Completed: Hospitalized since last visit: No Patient Requires Transmission- No Pain Present Now: No Based Precautions: Patient Has Alerts: Yes Patient Alerts: Patient on Blood Thinner No ABIs dt LE blood clots Electronic Signature(s) Signed: 03/07/2015 5:39:33 PM By: Montey Hora Entered By: Montey Hora on 03/07/2015 09:42:33 Philip [name] ([id number]) -------------------------------------------------------------------------------- Clinic Level of Care Assessment Details Patient Name: Philip [name]. Date of Service: 03/07/2015 9:30 AM Medical Record Number: [id number] Patient Account Number: [id number] Date of Birth/Sex: [birth year] (81 y.o. Male) Treating RN: Montey Hora Primary Care Physician: Hortencia Pilar Other Clinician: Referring Physician: Hortencia Pilar Treating Physician/Extender: Frann Rider in Treatment: 23 Clinic Level of Care Assessment Items TOOL 4 Quantity Score []  - Use when only an EandM is performed on FOLLOW-UP visit 0 ASSESSMENTS - Nursing Assessment /  Reassessment X - Reassessment of Co-morbidities (includes updates in patient status) 1 10 X - Reassessment of Adherence to Treatment Plan 1 5 ASSESSMENTS - Wound and Skin Assessment / Reassessment []  - Simple Wound Assessment / Reassessment - one wound 0 X - Complex Wound Assessment / Reassessment - multiple wounds 2 5 []  - Dermatologic / Skin Assessment (not related to wound area) 0 ASSESSMENTS - Focused Assessment []  - Circumferential Edema Measurements - multi extremities 0 []  - Nutritional Assessment / Counseling / Intervention 0 X - Lower Extremity Assessment (monofilament, tuning fork, pulses) 1 5 []  - Peripheral Arterial Disease Assessment (using hand held doppler) 0 ASSESSMENTS - Ostomy and/or Continence Assessment and Care []  - Incontinence Assessment and Management 0 []  - Ostomy Care Assessment and Management (repouching, etc.) 0 PROCESS - Coordination of Care X - Simple Patient / Family Education for ongoing care 1 15 []  - Complex (extensive) Patient / Family Education for ongoing care 0 []  - Staff obtains Programmer, systems, Records, Test Results / Process Orders 0 []  - Staff telephones HHA, Nursing Homes / Clarify orders / etc 0 []  - Routine Transfer to another Facility (non-emergent condition) 0 TRU, ROVER ([id number]) []  - Routine Hospital Admission (non-emergent condition) 0 []  - New Admissions / Biomedical engineer / Ordering NPWT, Apligraf, etc. 0 []  - Emergency Hospital Admission (emergent condition) 0 X - Simple Discharge Coordination 1 10 []  - Complex (extensive) Discharge Coordination 0 PROCESS - Special Needs []  - Pediatric / Minor Patient Management 0 []  - Isolation Patient Management 0 []  - Hearing / Language / Visual special needs 0 []  - Assessment of Community assistance (transportation, D/C planning, etc.) 0 []  - Additional assistance / Altered mentation 0 []  - Support Surface(s) Assessment (bed, cushion, seat, etc.) 0 INTERVENTIONS - Wound Cleansing /  Measurement []  - Simple Wound Cleansing - one wound 0 X - Complex Wound Cleansing - multiple wounds 2 5 X - Wound Imaging (photographs - any number  of wounds) 1 5 []  - Wound Tracing (instead of photographs) 0 []  - Simple Wound Measurement - one wound 0 X - Complex Wound Measurement - multiple wounds 2 5 INTERVENTIONS - Wound Dressings []  - Small Wound Dressing one or multiple wounds 0 X - Medium Wound Dressing one or multiple wounds 2 15 []  - Large Wound Dressing one or multiple wounds 0 []  - Application of Medications - topical 0 []  - Application of Medications - injection 0 INTERVENTIONS - Miscellaneous []  - External ear exam 0 JAVIAR, TUY ([id number]) []  - Specimen Collection (cultures, biopsies, blood, body fluids, etc.) 0 []  - Specimen(s) / Culture(s) sent or taken to Lab for analysis 0 []  - Patient Transfer (multiple staff / Harrel Lemon Lift / Similar devices) 0 []  - Simple Staple / Suture removal (25 or less) 0 []  - Complex Staple / Suture removal (26 or more) 0 []  - Hypo / Hyperglycemic Management (close monitor of Blood Glucose) 0 []  - Ankle / Brachial Index (ABI) - do not check if billed separately 0 X - Vital Signs 1 5 Has the patient been seen at the hospital within the last three years: Yes Total Score: 115 Level Of Care: New/Established - Level 3 Electronic Signature(s) Signed: 03/07/2015 5:39:33 PM By: Montey Hora Entered By: Montey Hora on 03/07/2015 10:15:28 Philip [name] ([id number]) -------------------------------------------------------------------------------- Encounter Discharge Information Details Patient Name: Philip [name], Philip [name]. Date of Service: 03/07/2015 9:30 AM Medical Record Number: [id number] Patient Account Number: [id number] Date of Birth/Sex: [birth year] (81 y.o. Male) Treating RN: Montey Hora Primary Care Physician: Hortencia Pilar Other Clinician: Referring Physician: Hortencia Pilar Treating Physician/Extender: Frann Rider in  Treatment: 59 Encounter Discharge Information Items Discharge Pain Level: 0 Discharge Condition: Stable Ambulatory Status: Walker Discharge Destination: Home Transportation: Private Auto Accompanied By: son Schedule Follow-up Appointment: Yes Medication Reconciliation completed and provided to Patient/Care No Deven Audi: Provided on Clinical Summary of Care: 03/07/2015 Form Type Recipient Paper Patient GT Electronic Signature(s) Signed: 03/07/2015 11:59:57 AM By: Montey Hora Previous Signature: 03/07/2015 10:31:34 AM Version By: Ruthine Dose Entered By: Montey Hora on 03/07/2015 11:59:57 Philip [name] ([id number]) -------------------------------------------------------------------------------- Lower Extremity Assessment Details Patient Name: Philip [name], Philip [name]. Date of Service: 03/07/2015 9:30 AM Medical Record Number: [id number] Patient Account Number: [id number] Date of Birth/Sex: [birth year] (81 y.o. Male) Treating RN: Montey Hora Primary Care Physician: Hortencia Pilar Other Clinician: Referring Physician: Hortencia Pilar Treating Physician/Extender: Frann Rider in Treatment: 38 Vascular Assessment Pulses: Posterior Tibial Dorsalis Pedis Palpable: [Left:Yes] Extremity colors, hair growth, and conditions: Extremity Color: [Left:Hyperpigmented] Hair Growth on Extremity: [Left:No] Temperature of Extremity: [Left:Warm] Capillary Refill: [Left:< 3 seconds] Electronic Signature(s) Signed: 03/07/2015 5:39:33 PM By: Montey Hora Entered By: Montey Hora on 03/07/2015 09:55:49 Philip [name] ([id number]) -------------------------------------------------------------------------------- Multi Wound Chart Details Patient Name: Philip [name]. Date of Service: 03/07/2015 9:30 AM Medical Record Number: [id number] Patient Account Number: [id number] Date of Birth/Sex: 1918/07/20 (81 y.o. 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] [5R:Skin Tear] [N/A:N/A] Comorbid History: [4:Cataracts, Arrhythmia, Congestive Heart Failure] [5R:Cataracts, Arrhythmia, Congestive Heart Failure] [N/A:N/A] Date Acquired: [4:09/20/2014] [5R:09/22/2014] [N/A:N/A] Weeks of Treatment: [4:24] [5R:22] [  N/A:N/A] Wound Status: [4:Open] [5R:Open] [N/A:N/A] Wound Recurrence: [4:No] [5R:Yes] [N/A:N/A] Measurements L x W x D 2.5x2.5x0.3 [5R:5x13x0.1] [N/A:N/A] (cm) Area (cm) : [id number] [5R:51.051] [N/A:N/A] Volume (cm) : [4:1.473] [5R:5.105] [N/A:N/A] % Reduction in Area: [4:-3026.80%] [5R:-712.50%] [N/A:N/A] % Reduction in Volume: -9106.20% [5R:-712.90%] [N/A:N/A] Classification: [4:Full Thickness With Exposed Support Structures] [5R:Partial Thickness] [N/A:N/A] Exudate Amount: [4:Large] [5R:Medium] [N/A:N/A] Exudate Type: [4:Serous] [5R:Serosanguineous] [N/A:N/A] Exudate Color: [4:amber] [5R:red, brown] [N/A:N/A] Wound Margin: [4:Distinct, outline attached] [5R:Flat and Intact] [N/A:N/A] Granulation Amount: [4:None Present (0%)] [5R:Large (67-100%)] [N/A:N/A] Granulation Quality: [4:N/A] [5R:Red, Pink] [N/A:N/A] Necrotic Amount: [4:Large (67-100%)] [5R:None Present (0%)] [N/A:N/A] Exposed Structures: [4:Fascia: No Fat: No Tendon: No Muscle: No] [5R:Fascia: No Fat: No Tendon: No Muscle: No] [N/A:N/A] Joint: No Joint: No Bone: No Bone: No Limited to Skin Limited to Skin Breakdown Breakdown Epithelialization:  None N/A N/A Periwound Skin Texture: No Abnormalities Noted Edema: No N/A Excoriation: No Induration: No Callus: No Crepitus: No Fluctuance: No Friable: No Rash: No Scarring: No Periwound Skin No Abnormalities Noted Maceration: No N/A Moisture: Moist: No Dry/Scaly: No Periwound Skin Color: Erythema: Yes Atrophie Blanche: No N/A Hemosiderin Staining: Yes Cyanosis: No Ecchymosis: No Erythema: No Hemosiderin Staining: No Mottled: No Pallor: No Rubor: No Erythema Location: Circumferential N/A N/A Tenderness on No No N/A Palpation: Wound Preparation: Ulcer Cleansing: Other: Ulcer Cleansing: N/A soap and water Rinsed/Irrigated with Saline Topical Anesthetic Applied: Other: lidocaine Topical Anesthetic 4% Applied: None Treatment Notes Electronic Signature(s) Signed: 03/07/2015 5:39:33 PM By: Montey Hora Entered By: Montey Hora on 03/07/2015 10:05:05 Philip [name] ([id number]) -------------------------------------------------------------------------------- [place] Details Patient Name: Philip [name], Philip [name]. Date of Service: 03/07/2015 9:30 AM Medical Record Number: [id number] Patient Account Number: [id number] Date of Birth/Sex: 1918-08-20 (81 y.o. Male) Treating RN: Montey Hora Primary Care Physician: Hortencia Pilar Other Clinician: Referring Physician: Hortencia Pilar Treating Physician/Extender: Frann Rider in Treatment: 78 Active Inactive Abuse / Safety / Falls / Self Care Management Nursing Diagnoses: Abuse or neglect; actual or potential Potential for falls Goals: Patient will remain injury free Date Initiated: 05/02/2014 Goal Status: Active Interventions: Assess fall risk on admission and as needed Notes: Necrotic Tissue Nursing Diagnoses: Impaired tissue integrity related to necrotic/devitalized tissue Goals: Necrotic/devitalized tissue will be minimized in the wound bed Date Initiated: 05/02/2014 Goal Status:  Active Interventions: Assess patient pain level pre-, during and post procedure and prior to discharge Treatment Activities: Apply topical anesthetic as ordered : 03/07/2015 Notes: Orientation to the Wound Care Program Nursing Diagnoses: Knowledge deficit related to the wound healing center program Philip [name], Philip [name] ([id number]) Goals: Patient/caregiver will verbalize understanding of the [place] Program Date Initiated: 05/02/2014 Goal Status: Active Interventions: Provide education on orientation to the wound center Notes: Electronic Signature(s) Signed: 03/07/2015 5:39:33 PM By: Montey Hora Entered By: Montey Hora on 03/07/2015 10:04:54 Philip [name] ([id number]) -------------------------------------------------------------------------------- Patient/Caregiver Education Details Patient Name: Philip [name]. Date of Service: 03/07/2015 9:30 AM Medical Record Number: [id number] Patient Account Number: [id number] Date of Birth/Gender: [birth year] (81 y.o. Male) Treating RN: Montey Hora Primary Care Physician: Hortencia Pilar Other Clinician: Referring Physician: Hortencia Pilar Treating Physician/Extender: Frann Rider in Treatment: 35 Education Assessment Education Provided To: Patient Education Topics Provided Wound/Skin Impairment: Handouts: Other: wound care as ordered Methods: Explain/Verbal Responses: State content correctly Electronic Signature(s) Signed: 03/07/2015 12:00:15 PM By: Montey Hora Entered By: Montey Hora on 03/07/2015 12:00:15 Philip [name] ([id number]) -------------------------------------------------------------------------------- Wound Assessment Details Patient Name: Philip [name], Philip [name]. Date of Service: 03/07/2015 9:30 AM Medical Record Number: [id number] Patient  Account Number: [id number] Date of Birth/Sex: 03/07/1919 (81 y.o. Male) Treating RN: Montey Hora Primary Care Physician: Hortencia Pilar Other  Clinician: Referring Physician: Hortencia Pilar Treating Physician/Extender: Frann Rider in Treatment: 44 Wound Status Wound Number: 4 Primary Malignant Wound Etiology: Wound Location: Left Malleolus - Medial Wound Status: Open Wounding Event: Other Lesion Comorbid Cataracts, Arrhythmia, Congestive Date Acquired: 09/20/2014 History: Heart Failure Weeks Of Treatment: 24 Clustered Wound: No Photos Photo Uploaded By: Gretta Cool, RN, BSN, Kim on 03/07/2015 17:31:36 Wound Measurements Length: (cm) 2.5 Width: (cm) 2.5 Depth: (cm) 0.3 Area: (cm) 4.909 Volume: (cm) 1.473 % Reduction in Area: -3026.8% % Reduction in Volume: -9106.2% Epithelialization: None Tunneling: No Undermining: No Wound Description Full Thickness With Exposed Classification: Support Structures Wound Margin: Distinct, outline attached Exudate Large Amount: Exudate Type: Serous Exudate Color: amber Wound Bed Granulation Amount: None Present (0%) Exposed Structure Necrotic Amount: Large (67-100%) Fascia Exposed: No Necrotic Quality: Adherent Slough Fat Layer Exposed: No Philip [name], Philip [name] ([id number]) Tendon Exposed: No Muscle Exposed: No Joint Exposed: No Bone Exposed: No Limited to Skin Breakdown Periwound Skin Texture Texture Color No Abnormalities Noted: No No Abnormalities Noted: No Erythema: Yes Moisture Erythema Location: Circumferential No Abnormalities Noted: No Hemosiderin Staining: Yes Wound Preparation Ulcer Cleansing: Other: soap and water, Topical Anesthetic Applied: Other: lidocaine 4%, Treatment Notes Wound #4 (Left, Medial Malleolus) 1. Cleansed with: Cleanse wound with antibacterial soap and water 2. Anesthetic Topical Lidocaine 4% cream to wound bed prior to debridement 4. Dressing Applied: Other dressing (specify in notes) 5. Secondary Dressing Applied Foam Kerlix/Conform 7. Secured with Tape Notes sorbact, siltec foam, Kerlix and Coban from base of toe to  knee Electronic Signature(s) Signed: 03/07/2015 5:39:33 PM By: Montey Hora Entered By: Montey Hora on 03/07/2015 09:56:24 Philip [name] ([id number]) -------------------------------------------------------------------------------- Wound Assessment Details Patient Name: Philip [name], Philip [name]. Date of Service: 03/07/2015 9:30 AM Medical Record Number: [id number] Patient Account Number: [id number] Date of Birth/Sex: [birth year] (81 y.o. Male) Treating RN: Montey Hora Primary Care Physician: Hortencia Pilar Other Clinician: Referring Physician: Hortencia Pilar Treating Physician/Extender: Frann Rider in Treatment: 21 Wound Status Wound Number: 5R Primary Skin Tear Etiology: Wound Location: Right Forearm - Lateral Wound Status: Open Wounding Event: Shear/Friction Comorbid Cataracts, Arrhythmia, Congestive Date Acquired: 09/22/2014 History: Heart Failure Weeks Of Treatment: 22 Clustered Wound: No Photos Photo Uploaded By: Gretta Cool, RN, BSN, Kim on 03/07/2015 17:31:36 Wound Measurements Length: (cm) 5 Width: (cm) 13 Depth: (cm) 0.1 Area: (cm) 51.051 Volume: (cm) 5.105 % Reduction in Area: -712.5% % Reduction in Volume: -712.9% Tunneling: No Undermining: No Wound Description Classification: Partial Thickness Wound Margin: Flat and Intact Exudate Amount: Medium Exudate Type: Serosanguineous Exudate Color: red, brown Foul Odor After Cleansing: No Wound Bed Granulation Amount: Large (67-100%) Exposed Structure Granulation Quality: Red, Pink Fascia Exposed: No Necrotic Amount: None Present (0%) Fat Layer Exposed: No Tendon Exposed: No Philip [name], Philip [name] ([id number]) Muscle Exposed: No Joint Exposed: No Bone Exposed: No Limited to Skin Breakdown Periwound Skin Texture Texture Color No Abnormalities Noted: No No Abnormalities Noted: No Callus: No Atrophie Blanche: No Crepitus: No Cyanosis: No Excoriation: No Ecchymosis: No Fluctuance: No Erythema:  No Friable: No Hemosiderin Staining: No Induration: No Mottled: No Localized Edema: No Pallor: No Rash: No Rubor: No Scarring: No Moisture No Abnormalities Noted: No Dry / Scaly: No Maceration: No Moist: No Wound Preparation Ulcer Cleansing: Rinsed/Irrigated with Saline Topical Anesthetic Applied: None Treatment Notes Wound #5R (Right, Lateral Forearm) 1. Cleansed with: Clean wound with Normal Saline 4. Dressing Applied:  Other dressing (specify in notes) 5. Secondary Dressing Applied ABD Pad Notes hydroactive B secured with coban Electronic Signature(s) Signed: 03/07/2015 5:39:33 PM By: Montey Hora Entered By: Montey Hora on 03/07/2015 09:58:06 Philip [name] ([id number]) -------------------------------------------------------------------------------- [place] Details Patient Name: Philip [name]. Date of Service: 03/07/2015 9:30 AM Medical Record Number: [id number] Patient Account Number: [id number] Date of Birth/Sex: March 24, 1919 (81 y.o. Male) Treating RN: Montey Hora Primary Care Physician: Hortencia Pilar Other Clinician: Referring Physician: Hortencia Pilar Treating Physician/Extender: Frann Rider in Treatment: 62 Vital Signs Time Taken: 09:42 Temperature (F): 97.6 Height (in): 69 Pulse (bpm): 71 Weight (lbs): 179 Respiratory Rate (breaths/min): 18 Body Mass Index (BMI): 26.4 Blood Pressure (mmHg): 135/53 Reference Range: 80 - 120 mg / dl Electronic Signature(s) Signed: 03/07/2015 5:39:33 PM By: Montey Hora Entered By: Montey Hora on 03/07/2015 09:44:44

## 2015-03-12 ENCOUNTER — Ambulatory Visit: Payer: Medicare Other

## 2015-03-21 ENCOUNTER — Encounter: Payer: Medicare Other | Attending: Surgery | Admitting: Surgery

## 2015-03-21 DIAGNOSIS — Z9221 Personal history of antineoplastic chemotherapy: Secondary | ICD-10-CM | POA: Diagnosis not present

## 2015-03-21 DIAGNOSIS — W19XXXA Unspecified fall, initial encounter: Secondary | ICD-10-CM | POA: Insufficient documentation

## 2015-03-21 DIAGNOSIS — I70232 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: Secondary | ICD-10-CM | POA: Diagnosis not present

## 2015-03-21 DIAGNOSIS — I82401 Acute embolism and thrombosis of unspecified deep veins of right lower extremity: Secondary | ICD-10-CM | POA: Diagnosis not present

## 2015-03-21 DIAGNOSIS — L97522 Non-pressure chronic ulcer of other part of left foot with fat layer exposed: Secondary | ICD-10-CM | POA: Insufficient documentation

## 2015-03-21 DIAGNOSIS — S91301A Unspecified open wound, right foot, initial encounter: Secondary | ICD-10-CM | POA: Diagnosis not present

## 2015-03-21 DIAGNOSIS — S41111A Laceration without foreign body of right upper arm, initial encounter: Secondary | ICD-10-CM | POA: Insufficient documentation

## 2015-03-22 NOTE — Progress Notes (Addendum)
LETHANIEL, POCHE ([id number]) Visit Report for 03/21/2015 Arrival Information Details Patient Name: Philip, [name]. Date of Service: 03/21/2015 10:00 AM Medical Record Number: [id number] Patient Account Number: [id number] Date of Birth/Sex: [birth year] (81 y.o. Male) Treating RN: Baruch Gouty, RN, BSN, Velva Harman Primary Care Physician: Hortencia Pilar Other Clinician: Referring Physician: Hortencia Pilar Treating Physician/Extender: Frann Rider in Treatment: 57 Visit Information History Since Last Visit Any new allergies or adverse reactions: No Patient Arrived: [name] Had a fall or experienced change in No Arrival Time: 10:08 activities of daily living that may affect Accompanied By: son risk of falls: Transfer Assistance: None Hospitalized since last visit: No Patient Identification Verified: Yes Has Dressing in Place as Prescribed: Yes Secondary Verification Process Yes Pain Present Now: No Completed: Patient Requires Transmission- No Based Precautions: Patient Has Alerts: Yes Patient Alerts: Patient on Blood Thinner No ABIs dt LE blood clots Electronic Signature(s) Signed: 03/21/2015 10:11:08 AM By: Regan Lemming BSN, RN Entered By: Regan Lemming on 03/21/2015 10:11:08 Philip [name] ([id number]) -------------------------------------------------------------------------------- Encounter Discharge Information Details Patient Name: Philip, [name]. Date of Service: 03/21/2015 10:00 AM Medical Record Number: [id number] Patient Account Number: [id number] Date of Birth/Sex: [birth year] (81 y.o. Male) Treating RN: Baruch Gouty, RN, BSN, Velva Harman Primary Care Physician: Hortencia Pilar Other Clinician: Referring Physician: Hortencia Pilar Treating Physician/Extender: Frann Rider in Treatment: 84 Encounter Discharge Information Items Discharge Pain Level: 0 Discharge Condition: Stable Ambulatory Status: Walker Discharge Destination: Home Transportation: Private Auto Accompanied By:  son Schedule Follow-up Appointment: No Medication Reconciliation completed No and provided to Patient/Care Berkleigh Beckles: Provided on Clinical Summary of Care: 03/21/2015 Form Type Recipient Paper Patient GT Electronic Signature(s) Signed: 03/21/2015 10:43:50 AM By: Ruthine Dose Previous Signature: 03/21/2015 10:26:20 AM Version By: Regan Lemming BSN, RN Entered By: Ruthine Dose on 03/21/2015 10:43:50 Philip [name] ([id number]) -------------------------------------------------------------------------------- Lower Extremity Assessment Details Patient Name: Philip, [name]. Date of Service: 03/21/2015 10:00 AM Medical Record Number: [id number] Patient Account Number: [id number] Date of Birth/Sex: 27-Jun-1918 (81 y.o. Male) Treating RN: Baruch Gouty, RN, BSN, Velva Harman Primary Care Physician: Hortencia Pilar Other Clinician: Referring Physician: Hortencia Pilar Treating Physician/Extender: Frann Rider in Treatment: 21 Vascular Assessment Pulses: Posterior Tibial Dorsalis Pedis Palpable: [Left:No] [Right:Yes] Doppler: [Left:Monophasic] Extremity colors, hair growth, and conditions: Extremity Color: [Left:Mottled] Hair Growth on Extremity: [Left:Yes] Temperature of Extremity: [Left:Warm] Capillary Refill: [Left:< 3 seconds] Toe Nail Assessment Left: Right: Thick: Yes Discolored: Yes Deformed: No Improper Length and Hygiene: No Electronic Signature(s) Signed: 03/21/2015 10:24:39 AM By: Regan Lemming BSN, RN Previous Signature: 03/21/2015 10:14:45 AM Version By: Regan Lemming BSN, RN Entered By: Regan Lemming on 03/21/2015 10:24:39 Philip [name] ([id number]) -------------------------------------------------------------------------------- Multi Wound Chart Details Patient Name: Philip [name]. Date of Service: 03/21/2015 10:00 AM Medical Record Number: [id number] Patient Account Number: [id number] Date of Birth/Sex: [birth year] (81 y.o. 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] [5R:Skin Tear] [N/A:N/A] Comorbid History: [4:Cataracts, Arrhythmia, Congestive Heart Failure] [5R:Cataracts, Arrhythmia, Congestive Heart Failure] [N/A:N/A] Date Acquired: [4:09/20/2014] [5R:09/22/2014] [N/A:N/A] Weeks of Treatment: [4:26] [5R:24] [N/A:N/A] Wound Status: [4:Open] [5R:Open] [N/A:N/A] Wound Recurrence: [4:No] [5R:Yes] [N/A:N/A] Measurements L x W x D  3.5x2.5x0.3 [5R:3x1x0.1] [N/A:N/A] (cm) Area (cm) : [4:6.872] [5R:2.356] [N/A:N/A] Volume (cm) : [4:2.062] [5R:0.236] [N/A:N/A] % Reduction in Area: [4:-4277.10%] [5R:62.50%] [N/A:N/A] % Reduction in Volume: -12787.50% [5R:62.40%] [N/A:N/A] Classification: [4:Full Thickness With Exposed Support Structures] [5R:Partial Thickness] [N/A:N/A] Exudate Amount: [4:Large] [5R:Medium] [N/A:N/A] Exudate Type: [4:Serous] [5R:Serosanguineous] [N/A:N/A] Exudate Color: [4:amber] [5R:red, brown] [N/A:N/A] Wound Margin: [4:Distinct, outline attached] [5R:Flat and Intact] [N/A:N/A] Granulation Amount: [4:None Present (0%)] [5R:Large (67-100%)] [N/A:N/A] Granulation Quality: [4:N/A] [5R:Red, Pink] [N/A:N/A] Necrotic Amount: [4:Large (67-100%)] [5R:None Present (0%)] [N/A:N/A] Exposed Structures: [4:Fascia: No Fat: No Tendon: No Muscle: No] [5R:Fascia: No Fat: No Tendon: No Muscle: No] [N/A:N/A] Joint: No Joint: No Bone: No Bone: No Limited to Skin Limited to Skin Breakdown  Breakdown Epithelialization: None Large (67-100%) N/A Periwound Skin Texture: Edema: Yes Edema: No N/A Excoriation: No Induration: No Callus: No Crepitus: No Fluctuance: No Friable: No Rash: No Scarring: No Periwound Skin Maceration: Yes Moist: Yes N/A Moisture: Moist: Yes Maceration: No Dry/Scaly: No Periwound Skin Color: Erythema: Yes Atrophie Blanche: No N/A Hemosiderin Staining: Yes Cyanosis: No Ecchymosis: No Erythema: No Hemosiderin Staining: No Mottled: No Pallor: No Rubor: No Erythema Location: Circumferential N/A N/A Temperature: No Abnormality No Abnormality N/A Tenderness on Yes No N/A Palpation: Wound Preparation: Ulcer Cleansing: Other: Ulcer Cleansing: N/A soap and water Rinsed/Irrigated with Saline Topical Anesthetic Applied: Other: lidocaine Topical Anesthetic 4% Applied: None Treatment Notes Electronic Signature(s) Signed: 03/21/2015 10:23:22 AM By: Regan Lemming BSN, RN Entered By: Regan Lemming on 03/21/2015 10:23:22 Philip [name] ([id number]) -------------------------------------------------------------------------------- [place] Details Patient Name: Philip, [name]. Date of Service: 03/21/2015 10:00 AM Medical Record Number: [id number] Patient Account Number: [id number] Date of Birth/Sex: [birth year] (81 y.o. Male) Treating RN: Baruch Gouty, RN, BSN, Velva Harman Primary Care Physician: Hortencia Pilar Other Clinician: Referring Physician: Hortencia Pilar Treating Physician/Extender: Frann Rider in Treatment: 34 Active Inactive Abuse / Safety / Falls / Self Care Management Nursing Diagnoses: Abuse or neglect; actual or potential Potential for falls Goals: Patient will remain injury free Date Initiated: 05/02/2014 Goal Status: Active Interventions: Assess fall risk on admission and as needed Notes: Necrotic Tissue Nursing Diagnoses: Impaired tissue integrity related to necrotic/devitalized  tissue Goals: Necrotic/devitalized tissue will be minimized in the wound bed Date Initiated: 05/02/2014 Goal Status: Active Interventions: Assess patient pain level pre-, during and post procedure and prior to discharge Treatment Activities: Apply topical anesthetic as ordered : 03/21/2015 Notes: Orientation to the Wound Care Program Nursing Diagnoses: Knowledge deficit related to the wound healing center program Philip, [name] ([id number]) Goals: Patient/caregiver will verbalize understanding of the [place] Program Date Initiated: 05/02/2014 Goal Status: Active Interventions: Provide education on orientation to the wound center Notes: Electronic Signature(s) Signed: 03/21/2015 10:23:12 AM By: Regan Lemming BSN, RN Entered By: Regan Lemming on 03/21/2015 10:23:12 Philip [name] ([id number]) -------------------------------------------------------------------------------- Pain Assessment Details Patient Name: Philip [name]. Date of Service: 03/21/2015 10:00 AM Medical Record Number: [id number] Patient Account Number: [id number] Date of Birth/Sex: 14-Nov-1918 (81 y.o. Male) Treating RN: Baruch Gouty, RN, BSN, Velva Harman Primary Care Physician: Hortencia Pilar Other Clinician: Referring Physician: Hortencia Pilar Treating Physician/Extender: Frann Rider in Treatment: 45 Active Problems Location of Pain Severity and Description of Pain Patient Has Paino Patient Unable to Respond Site Locations Pain Management and Medication Current Pain Management: Electronic Signature(s) Signed: 03/21/2015 10:11:21 AM By: Regan Lemming BSN, RN Entered By: Regan Lemming on 03/21/2015 10:11:21 Philip [name] ([id number]) -------------------------------------------------------------------------------- Patient/Caregiver Education Details Patient Name: Philip, [name]. Date of Service: 03/21/2015 10:00 AM Medical Record  Number: [id number] Patient Account Number: [id number] Date of Birth/Gender:  1919/02/16 (81 y.o. Male) Treating RN: Baruch Gouty, RN, BSN, Velva Harman Primary Care Physician: Hortencia Pilar Other Clinician: Referring Physician: Hortencia Pilar Treating Physician/Extender: Frann Rider in Treatment: 33 Education Assessment Education Provided To: Patient Education Topics Provided Welcome To [facility]: Methods: Explain/Verbal Responses: State content correctly Wound Debridement: Methods: Explain/Verbal Responses: State content correctly Electronic Signature(s) Signed: 03/21/2015 10:26:41 AM By: Regan Lemming BSN, RN Entered By: Regan Lemming on 03/21/2015 10:26:41 Philip [name] ([id number]) -------------------------------------------------------------------------------- Wound Assessment Details Patient Name: Philip, [name]. Date of Service: 03/21/2015 10:00 AM Medical Record Number: [id number] Patient Account Number: [id number] Date of Birth/Sex: 02/21/1919 (81 y.o. Male) Treating RN: Baruch Gouty, RN, BSN, [place] Primary Care Physician: Hortencia Pilar Other Clinician: Referring Physician: Hortencia Pilar Treating Physician/Extender: Frann Rider in Treatment: 46 Wound Status Wound Number: 4 Primary Malignant Wound Etiology: Wound Location: Left Malleolus - Medial Wound Status: Open Wounding Event: Other Lesion Comorbid Cataracts, Arrhythmia, Congestive Date Acquired: 09/20/2014 History: Heart Failure Weeks Of Treatment: 26 Clustered Wound: No Photos Wound Measurements Length: (cm) 3.5 Width: (cm) 2.5 Depth: (cm) 0.3 Area: (cm) 6.872 Volume: (cm) 2.062 % Reduction in Area: -4277.1% % Reduction in Volume: -12787.5% Epithelialization: None Tunneling: No Undermining: No Wound Description Full Thickness With Exposed Classification: Support Structures Wound Margin: Distinct, outline attached Exudate Large Amount: Exudate Type: Serous Exudate Color: amber Foul Odor After Cleansing: No Wound Bed Granulation Amount: None Present (0%)  Exposed Structure Necrotic Amount: Large (67-100%) Fascia Exposed: No Necrotic Quality: Adherent Slough Fat Layer Exposed: No TRADEN, ARNZEN ([id number]) Tendon Exposed: No Muscle Exposed: No Joint Exposed: No Bone Exposed: No Limited to Skin Breakdown Periwound Skin Texture Texture Color No Abnormalities Noted: No No Abnormalities Noted: No Localized Edema: Yes Erythema: Yes Erythema Location: Circumferential Moisture Hemosiderin Staining: Yes No Abnormalities Noted: No Maceration: Yes Temperature / Pain Moist: Yes Temperature: No Abnormality Tenderness on Palpation: Yes Wound Preparation Ulcer Cleansing: Other: soap and water, Topical Anesthetic Applied: Other: lidocaine 4%, Treatment Notes Wound #4 (Left, Medial Malleolus) 1. Cleansed with: Cleanse wound with antibacterial soap and water 2. Anesthetic Topical Lidocaine 4% cream to wound bed prior to debridement 4. Dressing Applied: Other dressing (specify in notes) 5. Secondary Dressing Applied Foam Kerlix/Conform 7. Secured with Tape Notes sorbact,hydroactive B to arm Kerlix and Coban from base of toe to knee Electronic Signature(s) Signed: 03/26/2015 11:29:31 AM By: Regan Lemming BSN, RN Previous Signature: 03/21/2015 10:19:02 AM Version By: Regan Lemming BSN, RN Entered By: Regan Lemming on 03/26/2015 11:29:31 Philip, [name] ([id number]) -------------------------------------------------------------------------------- Wound Assessment Details Patient Name: Philip, [name]. Date of Service: 03/21/2015 10:00 AM Medical Record Number: [id number] Patient Account Number: [id number] Date of Birth/Sex: 17-Mar-1919 (81 y.o. Male) Treating RN: Baruch Gouty, RN, BSN, Velva Harman Primary Care Physician: Hortencia Pilar Other Clinician: Referring Physician: Hortencia Pilar Treating Physician/Extender: Frann Rider in Treatment: 46 Wound Status Wound Number: 5R Primary Skin Tear Etiology: Wound Location: Right Forearm -  Lateral Wound Status: Open Wounding Event: Shear/Friction Comorbid Cataracts, Arrhythmia, Congestive Date Acquired: 09/22/2014 History: Heart Failure Weeks Of Treatment: 24 Clustered Wound: No Photos Wound Measurements Length: (cm) 3 Width: (cm) 1 Depth: (cm) 0.1 Area: (cm) 2.356 Volume: (cm) 0.236 % Reduction in Area: 62.5% % Reduction in Volume: 62.4% Epithelialization: Large (67-100%) Tunneling: No Undermining: No Wound Description Classification: Partial Thickness Wound Margin: Flat and Intact Exudate Amount: Medium Exudate Type: Serosanguineous Exudate Color: red, brown Foul Odor After Cleansing: No Wound Bed Granulation Amount:  Large (67-100%) Exposed Structure Granulation Quality: Red, Pink Fascia Exposed: No Necrotic Amount: None Present (0%) Fat Layer Exposed: No Tendon Exposed: No Muscle Exposed: No Philip, [name] ([id number]) Joint Exposed: No Bone Exposed: No Limited to Skin Breakdown Periwound Skin Texture Texture Color No Abnormalities Noted: No No Abnormalities Noted: No Callus: No Atrophie Blanche: No Crepitus: No Cyanosis: No Excoriation: No Ecchymosis: No Fluctuance: No Erythema: No Friable: No Hemosiderin Staining: No Induration: No Mottled: No Localized Edema: No Pallor: No Rash: No Rubor: No Scarring: No Temperature / Pain Moisture Temperature: No Abnormality No Abnormalities Noted: No Dry / Scaly: No Maceration: No Moist: Yes Wound Preparation Ulcer Cleansing: Rinsed/Irrigated with Saline Topical Anesthetic Applied: None Treatment Notes Wound #5R (Right, Lateral Forearm) 1. Cleansed with: Cleanse wound with antibacterial soap and water 2. Anesthetic Topical Lidocaine 4% cream to wound bed prior to debridement 4. Dressing Applied: Other dressing (specify in notes) 5. Secondary Dressing Applied Foam Kerlix/Conform 7. Secured with Tape Notes sorbact,hydroactive B to arm Kerlix and Coban from base of toe to  knee Electronic Signature(s) Signed: 03/26/2015 11:36:58 AM By: Regan Lemming BSN, RN Previous Signature: 03/21/2015 10:22:40 AM Version By: Regan Lemming BSN, RN Entered By: Regan Lemming on 03/26/2015 11:36:58 Philip, [name] ([id number], [name] ([id number]) -------------------------------------------------------------------------------- Vitals Details Patient Name: Philip, [name]. Date of Service: 03/21/2015 10:00 AM Medical Record Number: [id number] Patient Account Number: [id number] Date of Birth/Sex: 1918-11-09 (81 y.o. Male) Treating RN: Afful, RN, BSN, [place] Primary Care Physician: Hortencia Pilar Other Clinician: Referring Physician: Hortencia Pilar Treating Physician/Extender: Frann Rider in Treatment: 56 Vital Signs Time Taken: 10:11 Temperature (F): 97.5 Height (in): 69 Pulse (bpm): 48 Weight (lbs): 179 Respiratory Rate (breaths/min): 18 Body Mass Index (BMI): 26.4 Blood Pressure (mmHg): 133/98 Reference Range: 80 - 120 mg / dl Electronic Signature(s) Signed: 03/21/2015 10:11:43 AM By: Regan Lemming BSN, RN Entered By: Regan Lemming on 03/21/2015 10:11:43

## 2015-03-22 NOTE — Progress Notes (Addendum)
MATEUSZ, ROLLS ([id number]) Visit Report for 03/21/2015 Chief Complaint Document Details Patient Name: Philip [name], Philip [name]. Date of Service: 03/21/2015 10:00 AM Medical Record Number: [id number] Patient Account Number: [id number] Date of Birth/Sex: Nov 14, 1918 (81 y.o. Male) Treating RN: Baruch Gouty, RN, BSN, Velva Harman Primary Care Physician: Hortencia Pilar Other Clinician: Referring Physician: Hortencia Pilar Treating Physician/Extender: Frann Rider in Treatment: 46 Information Obtained from: Patient Chief Complaint R foot ulcer. L forearm ulcer. 07/19/2014 -- about 2 weeks ago he had a surgical procedure done by dermatologist in [place] and has an open surgical wound on the dorsum of the right foot. Electronic Signature(s) Signed: 03/21/2015 10:33:31 AM By: Christin Fudge MD, FACS Entered By: Christin Fudge on 03/21/2015 10:33:31 Philip [name] ([id number]) -------------------------------------------------------------------------------- Debridement Details Patient Name: Philip [name], Philip [name]. Date of Service: 03/21/2015 10:00 AM Medical Record Number: [id number] Patient Account Number: [id number] Date of Birth/Sex: Jun 27, 1918 (81 y.o. Male) Treating RN: Afful, RN, BSN, [place] Primary Care Physician: Hortencia Pilar Other Clinician: Referring Physician: Hortencia Pilar Treating Physician/Extender: Frann Rider in Treatment: 46 Debridement Performed for Wound #4 Left,Medial Malleolus Assessment: Performed By: Physician Christin Fudge, MD Debridement: Debridement Pre-procedure Yes Verification/Time Out Taken: Start Time: 10:26 Pain Control: Lidocaine 4% Topical Solution Level: Skin/Subcutaneous Tissue Total Area Debrided (L x 3.5 (cm) x 2.5 (cm) = 8.75 (cm) W): Tissue and other Non-Viable, Exudate, Fibrin/Slough, Subcutaneous material debrided: Instrument: Curette Bleeding: Minimum Hemostasis Achieved: Pressure End Time: 10:29 Procedural Pain: 0 Post Procedural Pain: 0 Response to  Treatment: Procedure was tolerated well Post Debridement Measurements of Total Wound Length: (cm) 3.5 Width: (cm) 2.5 Depth: (cm) 0.3 Volume: (cm) 2.062 Post Procedure Diagnosis Same as Pre-procedure Electronic Signature(s) Signed: 03/21/2015 10:33:17 AM By: Christin Fudge MD, FACS Signed: 03/21/2015 4:37:38 PM By: Regan Lemming BSN, RN Previous Signature: 03/21/2015 10:27:44 AM Version By: Regan Lemming BSN, RN Entered By: Christin Fudge on 03/21/2015 10:33:17 Philip [name] ([id number]) -------------------------------------------------------------------------------- HPI Details Patient Name: Philip [name], Philip [name]. Date of Service: 03/21/2015 10:00 AM Medical Record Number: [id number] Patient Account Number: [id number] Date of Birth/Sex: 03-Mar-1919 (81 y.o. Male) Treating RN: Afful, RN, BSN, [place] Primary Care Physician: Hortencia Pilar Other Clinician: Referring Physician: Hortencia Pilar Treating Physician/Extender: Frann Rider in Treatment: 53 History of Present Illness Location: right leg Duration: Dec 2015 Modifying Factors: history of an injury to the right leg with resulting hematoma and thrombophlebitis and later an ulcer of posterior leg Associated Signs and Symptoms: marked lymphedema of the right leg. He is already on Eloquis. HPI Description: 06/14/14 -- He returns for followup today. He denies any fevers. no fresh issues and his daughter says he's been doing fine. 06/21/14 -- after he sustained a fall this week earlier he applied a bandage over this himself and did not seek any medical attention. he did however manage to control the bleeding and had a dressing in place the next morning when his son to the visit. In this dressing was removed there was further damaged skin. His right leg has been doing fine otherwise. 07/12/14 --Very pleasant 81 year old with past medical history significant for congestive heart failure (EF 15%), peripheral vascular disease, and chronic kidney  disease. He was hospitalized at [facility] in December 2015 for congestive heart failure. He says that he fell during his hospital course and developed a hematoma over his right calf. He was also diagnosed with a right lower extremity DVT for which he takes Eliquis. The hematoma subsequently turned into an ulceration around Christmas, which has healed. He subsequently developed an  ulcer on his right dorsal foot and a traumatic left forearm ulcer. Per his report, he underwent biopsy of the right dorsal foot ulceration which demonstrated a skin cancer. His PCP and dermatologist office are both closed today. I reviewed his records in [place] but find no report of biopsy or pathology. He s without complaints today. No significant pain. No fever or chills. Minimal drainage. 07/19/2014 - the patient and his son tell me that about 2 weeks ago the dermatologist did a skin biopsy and this was a large area on the dorsum of his right foot which was left open and no dressing instructions were recommended. Since then he has been called and told that it is a cancer and the need to do a further procedure but that will not happen until about 2 weeks from now. In the meanwhile the patient has not been taking care of his right foot. The left forearm where he had an abrasion and laceration is doing very well. 07/26/2014 -- Reports from 07/08/2014 from the dermatology group reviewed. A excision was done of a lesion located on the dorsum of the right foot and this was 1.7 cm in diameter which was a shave biopsy performed. The wound was left open after appropriate cauterization and the patient was given this dressing instructions. 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 08/02/2014 -- all the original problems he came with have completely resolved. He now has a surgical wound on his right foot dorsum where a skin cancer was excised.  He goes to see his dermatologist this Philip [name], Philip [name] ([id number]) coming Tuesday and will have definite news next Friday. 08/09/2014 he had gone to his dermatologist on Tuesday and she has injected the base of his ulcer with some chemotherapeutic agent. He was supposed to bring some papers with him but forgot to get them and will bring them in next week. Other than that the dermatologist had suggested using Mehdi honey on the wound. 08/16/2014 -- the patient has brought in his notes from the dermatologist and on 08/06/2014 he received a injection of 5 FU, 500 mg grams into the lesion. the pathology report was also sent and it was a squamous cell carcinoma well-differentiated and edges were involved. They wanted him to use many honey for the wound dressing changes to be done 3 times a week. 08/30/2014 -- he has finished his second injection of 5-FU and has the next one in 2 weeks' time. He is doing fine otherwise. 09/13/2014 - No new complaints. No significant pain. No fever or chills. Minimal drainage. Still receiving 5-FU injections. 09/20/2014 -- He was seen by the dermatologist on 09/10/2014 and Dr. Phillip Heal injected his foot both the right on the dorsum and left near the medial malleolus with 5-FU. The next dose of 5-FU is to be given after 3 months. The patient says he now has a spot on the left medial malleolus where he was injected with 5-FU. 09/27/2014 -- the area on the left ankle where he was injected with 5-FU is now a full-blown ulcer. He also has mild pain in both ankle areas. 10/04/2014 - large had a bit of a fall and injured his right arm last evening and has had a laceration with no evidence of any foreign body in the right forearm. 12/20/2014 -- he recently saw his dermatologist at [facility] and she was pleased with his wound healing on the right lower extremity. She  has not given him any further 5-FU injections and will see him back on a when necessary basis. 01/03/2015 -- his  skin substitute Grafix has been approved by his insurance and we will go ahead with this next week. 01/10/2015 -- he has his first application of Grafix today. he recently had a nightmare and injured his right forearm and had some abrasions. 01/13/2015 -- Iona Beard has developed significant swelling of the left calf with some tenderness of the calf. This was only noticed this morning. Addendum: The DVT study done in the hospital -- IMPRESSION:No evidence of deep venous thrombosis left lower extremity. The result was called into the patient's son who acknowledges the report and will bring the patient back on Friday 01/17/2015 -- he saw his PCP Dr. Hoy Morn and she has increases dosage of Lasix and his edema on the left lower extremity is looking much better. He is here for a second applications of Grafix [id number] -- he is overall doing very well his edema has come down significantly. He is here for his third application of grafix. 02/04/2015 -- he has no new issues and is here for his fourth application of grafix [id number] -- he is here for a wound review today and has no fresh issues. 02/21/2015 -- he has no new issues and is here for his fifth and last application of grafix. Philip [name], Philip [name] ([id number]) 03/21/2015 -- he has had one of the vascular test done last week and is awaiting the second one. Other than that his health has not changed in any way. Electronic Signature(s) Signed: 03/21/2015 10:34:00 AM By: Christin Fudge MD, FACS Entered By: Christin Fudge on 03/21/2015 10:34:00 Philip [name] ([id number]) -------------------------------------------------------------------------------- Physical Exam Details Patient Name: Philip [name], Philip [name]. Date of Service: 03/21/2015 10:00 AM Medical Record Number: [id number] Patient Account Number: [id number] Date of Birth/Sex: [birth year] (81 y.o. Male) Treating RN: Baruch Gouty, RN, BSN, Velva Harman Primary Care Physician: Hortencia Pilar Other Clinician: Referring  Physician: Hortencia Pilar Treating Physician/Extender: Frann Rider in Treatment: 46 Constitutional . Pulse regular. Respirations normal and unlabored. Afebrile. . Eyes Nonicteric. Reactive to light. Ears, Nose, Mouth, and Throat Lips, teeth, and gums WNL.Marland Kitchen Moist mucosa without lesions . Neck supple and nontender. No palpable supraclavicular or cervical adenopathy. Normal sized without goiter. Respiratory WNL. No retractions.. Cardiovascular Pedal Pulses WNL. No clubbing, cyanosis or edema. Lymphatic No adneopathy. No adenopathy. No adenopathy. Musculoskeletal Adexa without tenderness or enlargement.. Digits and nails w/o clubbing, cyanosis, infection, petechiae, ischemia, or inflammatory conditions.. Integumentary (Hair, Skin) No suspicious lesions. No crepitus or fluctuance. No peri-wound warmth or erythema. No masses.Marland Kitchen Psychiatric Judgement and insight Intact.. No evidence of depression, anxiety, or agitation.. Notes the right arm is looking excellent and there are no large ulcerations of maceration. The left medial ankle has significant slough which nature up debridement with a curette. Under this there is some healthy granulation tissue. Electronic Signature(s) Signed: 03/21/2015 10:34:47 AM By: Christin Fudge MD, FACS Entered By: Christin Fudge on 03/21/2015 10:34:46 Philip [name] ([id number]) -------------------------------------------------------------------------------- Physician Orders Details Patient Name: Philip [name], Philip [name]. Date of Service: 03/21/2015 10:00 AM Medical Record Number: [id number] Patient Account Number: [id number] Date of Birth/Sex: December 05, 1918 (81 y.o. Male) Treating RN: Baruch Gouty, RN, BSN, Velva Harman Primary Care Physician: Hortencia Pilar Other Clinician: Referring Physician: Hortencia Pilar Treating Physician/Extender: Frann Rider in Treatment: 15 Verbal / Phone Orders: Yes Clinician: Afful, RN, BSN, Rita Read Back and Verified: Yes Diagnosis  Coding Wound Cleansing Wound #4 Left,Medial Malleolus o Clean wound with Normal Saline.  Wound #5R Right,Lateral Forearm o Clean wound with Normal Saline. Anesthetic Wound #4 Left,Medial Malleolus o Topical Lidocaine 4% cream applied to wound bed prior to debridement Wound #5R Right,Lateral Forearm o Topical Lidocaine 4% cream applied to wound bed prior to debridement Skin Barriers/Peri-Wound Care Wound #4 Left,Medial Malleolus o Moisturizing lotion Primary Wound Dressing Wound #4 Left,Medial Malleolus o Other: - sorbact Wound #5R Right,Lateral Forearm o Other: - hydroctive B and coban Secondary Dressing Wound #4 Left,Medial Malleolus o Foam Dressing Change Frequency Wound #4 Left,Medial Malleolus o Three times weekly Wound #5R Right,Lateral Forearm o Change dressing every week Philip [name], Philip [name] ([id number]) Follow-up Appointments Wound #4 Left,Medial Malleolus o Return Appointment in 2 weeks. Wound #5R Right,Lateral Forearm o Return Appointment in 2 weeks. Edema Control Wound #4 Left,Medial Malleolus o Other: - Kerlix and Coban from toes to knee Additional Orders / Instructions Wound #4 Left,Medial Malleolus o Other: - [employer] insurance for [employer] Wound #5R Right,Lateral Forearm o Other: - [employer] insurance for [place] #4 [place] Nurse may visit PRN to address patientos wound care needs. o FACE TO FACE ENCOUNTER: MEDICARE and MEDICAID PATIENTS: I certify that this patient is under my care and that I had a face-to-face encounter that meets the physician face-to-face encounter requirements with this patient on this date. The encounter with the patient was in whole or in part for the following MEDICAL CONDITION: (primary reason for [place]) MEDICAL NECESSITY: I certify, that based on my findings, NURSING services are a medically necessary home  health service. HOME BOUND STATUS: I certify that my clinical findings support that this patient is homebound (i.e., Due to illness or injury, pt requires aid of supportive devices such as crutches, cane, wheelchairs, walkers, the use of special transportation or the assistance of another person to leave their place of residence. There is a normal inability to leave the home and doing so requires considerable and taxing effort. Other absences are for medical reasons / religious services and are infrequent or of short duration when for other reasons). o If current dressing causes regression in wound condition, may D/C ordered dressing product/s and apply Normal Saline Moist Dressing daily until next [place] / Other MD appointment. [place] of regression in wound condition at [phone]. o Please direct any NON-WOUND related issues/requests for orders to patient's Primary Care Physician Wound #5R Right,Lateral Forearm o [place] Visits - [place] Nurse may visit PRN to address patientos wound care needs. o FACE TO FACE ENCOUNTER: MEDICARE and MEDICAID PATIENTS: I certify that this patient is under my care and that I had a face-to-face encounter that meets the physician face-to-face encounter requirements with this patient on this date. The encounter with the patient was in whole or in part for the following MEDICAL CONDITION: (primary reason for [place]) MEDICAL NECESSITY: I certify, that based on my findings, NURSING services are a medically Philip [name], Philip [name] ([id number]) necessary home health service. HOME BOUND STATUS: I certify that my clinical findings support that this patient is homebound (i.e., Due to illness or injury, pt requires aid of supportive devices such as crutches, cane, wheelchairs, walkers, the use of special transportation or the assistance of another person to leave their place of residence.  There is a normal inability to leave the home and doing so requires considerable and taxing effort. Other absences are for medical reasons / religious  services and are infrequent or of short duration when for other reasons). o If current dressing causes regression in wound condition, may D/C ordered dressing product/s and apply Normal Saline Moist Dressing daily until next [place] / Other MD appointment. [place] of regression in wound condition at [phone]. o Please direct any NON-WOUND related issues/requests for orders to patient's Primary Care Physician Electronic Signature(s) Signed: 03/21/2015 10:43:50 AM By: Regan Lemming BSN, RN Signed: 03/21/2015 4:40:44 PM By: Christin Fudge MD, FACS Previous Signature: 03/21/2015 10:28:45 AM Version By: Regan Lemming BSN, RN Entered By: Regan Lemming on 03/21/2015 10:43:50 Philip [name] ([id number]) -------------------------------------------------------------------------------- Problem List Details Patient Name: Philip [name], Philip [name]. Date of Service: 03/21/2015 10:00 AM Medical Record Number: [id number] Patient Account Number: [id number] Date of Birth/Sex: February 18, 1919 (81 y.o. Male) Treating RN: Baruch Gouty, RN, BSN, Velva Harman Primary Care Physician: Hortencia Pilar Other Clinician: Referring Physician: Hortencia Pilar Treating Physician/Extender: Frann Rider in Treatment: 53 Active Problems ICD-10 Encounter Code Description Active Date Diagnosis I70.232 Atherosclerosis of native arteries of right leg with 06/21/2014 Yes ulceration of calf I82.401 Acute embolism and thrombosis of unspecified deep veins 06/21/2014 Yes of right lower extremity S91.301A Unspecified open wound, right foot, initial encounter 07/19/2014 Yes Z92.21 Personal history of antineoplastic chemotherapy 08/23/2014 Yes L97.522 Non-pressure chronic ulcer of other part of left foot with fat 09/20/2014 Yes layer exposed S41.111A Laceration without foreign  body of right upper arm, initial 10/04/2014 Yes encounter Inactive Problems Resolved Problems ICD-10 Code Description Active Date Resolved Date L97.212 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 Date of Service: 03/21/2015 10:00 AM Medical Record Number: [id number] Patient Account Number: [id number] Date of Birth/Sex: 05/15/1918 (81 y.o. Male) Treating RN: Baruch Gouty, RN, BSN, Velva Harman Primary Care Physician: Hortencia Pilar Other Clinician: Referring Physician: Hortencia Pilar Treating Physician/Extender: Frann Rider in Treatment: 22 Subjective Chief Complaint Information obtained from Patient R foot ulcer. L forearm ulcer. 07/19/2014 -- about 2 weeks ago he had a surgical procedure done by dermatologist in [place] and has an open surgical wound on the dorsum of the right foot. History of Present Illness (HPI) The following HPI elements were documented for the patient's wound: Location: right leg Duration: Dec 2015 Modifying Factors: history of an injury to the right leg with resulting hematoma and thrombophlebitis and later an ulcer of posterior leg Associated Signs and Symptoms: marked lymphedema of the right leg. He is already on Eloquis. 06/14/14 -- He returns for followup today. He denies any fevers. no fresh issues and his daughter says he's been doing fine. 06/21/14 -- after he sustained a fall this week earlier he applied a bandage over this himself and did not seek any medical attention. he did however manage to control the  bleeding and had a dressing in place the next morning when his son to the visit. In this dressing was removed there was further damaged skin. His right leg has been doing fine otherwise. 07/12/14 --Very pleasant 81 year old with past medical history significant for congestive heart failure (EF 15%), peripheral vascular disease, and chronic kidney disease. He was hospitalized at [facility] in December 2015 for congestive heart failure. He says that he fell during his hospital course and developed  a hematoma over his right calf. He was also diagnosed with a right lower extremity DVT for which he takes Eliquis. The hematoma subsequently turned into an ulceration around Christmas, which has healed. He subsequently developed an ulcer on his right dorsal foot and a traumatic left forearm ulcer. Per his report, he underwent biopsy of the right dorsal foot ulceration which demonstrated a skin cancer. His PCP and dermatologist office are both closed today. I reviewed his records in [place] but find no report of biopsy or pathology. He s without complaints today. No significant pain. No fever or chills. Minimal drainage. 07/19/2014 - the patient and his son tell me that about 2 weeks ago the dermatologist did a skin biopsy and this was a large area on the dorsum of his right foot which was left open and no dressing instructions were recommended. Since then he has been called and told that it is a cancer and the need to do a further procedure but that will not happen until about 2 weeks from now. In the meanwhile the patient has not been taking care of his right foot. The left forearm where he had an abrasion and laceration is doing very well. Philip [name], Philip [name] ([id number]) 07/26/2014 -- Reports from 07/08/2014 from the dermatology group reviewed. A excision was done of a lesion located on the dorsum of the right foot and this was 1.7 cm in diameter which was a shave biopsy performed. The wound was left open after  appropriate cauterization and the patient was given this dressing instructions. 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 08/02/2014 -- all the original problems he came with have completely resolved. He now has a surgical wound on his right foot dorsum where a skin cancer was excised. He goes to see his dermatologist this coming Tuesday and will have definite news next Friday. 08/09/2014 he had gone to his dermatologist on Tuesday and she has injected the base of his ulcer with some chemotherapeutic agent. He was supposed to bring some papers with him but forgot to get them and will bring them in next week. Other than that the dermatologist had suggested using Mehdi honey on the wound. 08/16/2014 -- the patient has brought in his notes from the dermatologist and on 08/06/2014 he received a injection of 5 FU, 500 mg grams into the lesion. the pathology report was also sent and it was a squamous cell carcinoma well-differentiated and edges were involved. They wanted him to use many honey for the wound dressing changes to be done 3 times a week. 08/30/2014 -- he has finished his second injection of 5-FU and has the next one in 2 weeks' time. He is doing fine otherwise. 09/13/2014 - No new complaints. No significant pain. No fever or chills. Minimal drainage. Still receiving 5-FU injections. 09/20/2014 -- He was seen by the dermatologist on 09/10/2014 and Dr. Phillip Heal injected his foot both the right on the dorsum and left near the medial malleolus with 5-FU. The next dose of 5-FU is to be given after 3 months. The patient says he now has a spot on the left medial malleolus where he was injected with 5-FU. 09/27/2014 -- the area on the left ankle where he was injected with 5-FU is now a full-blown ulcer. He also has mild pain in both ankle areas. 10/04/2014 - large had a bit of a fall and injured his right  arm last evening and  has had a laceration with no evidence of any foreign body in the right forearm. 12/20/2014 -- he recently saw his dermatologist at [facility] and she was pleased with his wound healing on the right lower extremity. She has not given him any further 5-FU injections and will see him back on a when necessary basis. 01/03/2015 -- his skin substitute Grafix has been approved by his insurance and we will go ahead with this next week. 01/10/2015 -- he has his first application of Grafix today. he recently had a nightmare and injured his right forearm and had some abrasions. 01/13/2015 -- Iona Beard has developed significant swelling of the left calf with some tenderness of the calf. This was only noticed this morning. Addendum: The DVT study done in the hospital -- IMPRESSION:No evidence of deep venous thrombosis left lower extremity. The result was called into the patient's son who acknowledges the report and will bring the patient back on Friday HERALD, PUGMIRE ([id number]) 01/17/2015 -- he saw his PCP Dr. Hoy Morn and she has increases dosage of Lasix and his edema on the left lower extremity is looking much better. He is here for a second applications of Grafix [id number] -- he is overall doing very well his edema has come down significantly. He is here for his third application of grafix. 02/04/2015 -- he has no new issues and is here for his fourth application of grafix [id number] -- he is here for a wound review today and has no fresh issues. 02/21/2015 -- he has no new issues and is here for his fifth and last application of grafix. 03/21/2015 -- he has had one of the vascular test done last week and is awaiting the second one. Other than that his health has not changed in any way. Objective Constitutional Pulse regular. Respirations normal and unlabored. Afebrile. Vitals Time Taken: 10:11 AM, Height: 69 in, Weight: 179 lbs, BMI: 26.4, Temperature: 97.5 F, Pulse: 48 bpm,  Respiratory Rate: 18 breaths/min, Blood Pressure: 133/98 mmHg. Eyes Nonicteric. Reactive to light. Ears, Nose, Mouth, and Throat Lips, teeth, and gums WNL.Marland Kitchen Moist mucosa without lesions . Neck supple and nontender. No palpable supraclavicular or cervical adenopathy. Normal sized without goiter. Respiratory WNL. No retractions.. Cardiovascular Pedal Pulses WNL. No clubbing, cyanosis or edema. Lymphatic No adneopathy. No adenopathy. No adenopathy. Musculoskeletal Adexa without tenderness or enlargement.. Digits and nails w/o clubbing, cyanosis, infection, petechiae, ischemia, or inflammatory conditions.Marland Kitchen Psychiatric Judgement and insight Intact.. No evidence of depression, anxiety, or agitation.Philip [name] ([id number]) General Notes: the right arm is looking excellent and there are no large ulcerations of maceration. The left medial ankle has significant slough which nature up debridement with a curette. Under this there is some healthy granulation tissue. Integumentary (Hair, Skin) No suspicious lesions. No crepitus or fluctuance. No peri-wound warmth or erythema. No masses.. Wound #4 status is Open. Original cause of wound was Other Lesion. The wound is located on the Left,Medial Malleolus. The wound measures 3.5cm length x 2.5cm width x 0.3cm depth; 6.872cm^2 area and 2.062cm^3 volume. The wound is limited to skin breakdown. There is no tunneling or undermining noted. There is a large amount of serous drainage noted. The wound margin is distinct with the outline attached to the wound base. There is no granulation within the wound bed. There is a large (67-100%) amount of necrotic tissue within the wound bed including Adherent Slough. The periwound skin appearance exhibited: Localized Edema, Maceration, Moist, Hemosiderin Staining, Erythema. The surrounding wound skin color is noted with  erythema which is circumferential. Periwound temperature was noted as No Abnormality. The  periwound has tenderness on palpation. Wound #5R status is Open. Original cause of wound was Shear/Friction. The wound is located on the Right,Lateral Forearm. The wound measures 3cm length x 1cm width x 0.1cm depth; 2.356cm^2 area and 0.236cm^3 volume. The wound is limited to skin breakdown. There is no tunneling or undermining noted. There is a medium amount of serosanguineous drainage noted. The wound margin is flat and intact. There is large (67-100%) red, pink granulation within the wound bed. There is no necrotic tissue within the wound bed. The periwound skin appearance exhibited: Moist. The periwound skin appearance did not exhibit: Callus, Crepitus, Excoriation, Fluctuance, Friable, Induration, Localized Edema, Rash, Scarring, Dry/Scaly, Maceration, Atrophie Blanche, Cyanosis, Ecchymosis, Hemosiderin Staining, Mottled, Pallor, Rubor, Erythema. Periwound temperature was noted as No Abnormality. 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 JAQUORI, HUNSINGER ([id number]) On his left medial ankle we will use Sorbact and foam and will put a Kerlix and Coban to reduce his edema. This will be changed by home health and we will get this done 3 times a week. we are awaiting his vascular studies and if no vascular intervention is needed he may need another round of a skin substitute possibly Theraskin. We will look to see if this is  possible. 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 There was a Skin/Subcutaneous Tissue Debridement [id number]) debridement with total area of 8.75 sq cm performed by Christin Fudge, MD. with the following instrument(s): Curette to remove Non-Viable tissue/material including Exudate, Fibrin/Slough, and Subcutaneous after achieving pain control using Lidocaine 4% Topical Solution. A time out was conducted prior to the start of the procedure. A Minimum amount of bleeding was controlled with Pressure. The procedure was tolerated well with a pain level of 0 throughout and a pain level of 0 following the procedure. Post Debridement Measurements: 3.5cm length x 2.5cm width x 0.3cm depth; 2.062cm^3 volume. Post procedure Diagnosis Wound #4: Same as Pre-Procedure Plan Wound Cleansing: Wound #4 Left,Medial Malleolus: Clean wound with Normal Saline. Wound #5R Right,Lateral Forearm: Clean wound with Normal Saline. Anesthetic: Wound #4 Left,Medial Malleolus: Topical Lidocaine 4% cream applied to wound bed prior to debridement Wound #5R Right,Lateral Forearm: Topical Lidocaine 4% cream applied to wound bed prior to debridement Skin Barriers/Peri-Wound Care: Wound #4 Left,Medial Malleolus: Moisturizing lotion Primary Wound Dressing: Wound #4 Left,Medial Malleolus: Other: - sorbact Wound #5R Right,Lateral Forearm: Other: - hydroctive B and coban Secondary Dressing: Wound #4 Left,Medial Malleolus: Foam Dressing Change Frequency: Wound #4 Left,Medial MalleolusELMA, WYDRA ([id number]) Three times weekly Wound #5R Right,Lateral Forearm: Change dressing every week Follow-up Appointments: Wound #4 Left,Medial Malleolus: Return Appointment in 2 weeks. Wound #5R Right,Lateral Forearm: Return Appointment in 2 weeks. Edema Control: Wound #4 Left,Medial Malleolus: Other: - Kerlix and Coban from toes to knee Additional Orders /  Instructions: Wound #4 Left,Medial Malleolus: Other: - Ru[employer] insurance for [employer] Wound #  5R Right,Lateral Forearm: Other: - RuN insurance for [place]: Wound #4 Left,Medial Malleolus: Continue Home Health Visits - [place] Nurse may visit PRN to address patient s wound care needs. FACE TO FACE ENCOUNTER: MEDICARE and MEDICAID PATIENTS: I certify that this patient is under my care and that I had a face-to-face encounter that meets the physician face-to-face encounter requirements with this patient on this date. The encounter with the patient was in whole or in part for the following MEDICAL CONDITION: (primary reason for [place]) MEDICAL NECESSITY: I certify, that based on my findings, NURSING services are a medically necessary home health service. HOME BOUND STATUS: I certify that my clinical findings support that this patient is homebound (i.e., Due to illness or injury, pt requires aid of supportive devices such as crutches, cane, wheelchairs, walkers, the use of special transportation or the assistance of another person to leave their place of residence. There is a normal inability to leave the home and doing so requires considerable and taxing effort. Other absences are for medical reasons / religious services and are infrequent or of short duration when for other reasons). If current dressing causes regression in wound condition, may D/C ordered dressing product/s and apply Normal Saline Moist Dressing daily until next [place] / Other MD appointment. [place] of regression in wound condition at [phone]. Please direct any NON-WOUND related issues/requests for orders to patient's Primary Care Physician Wound #5R Right,Lateral Forearm: [place] Nurse may visit PRN to address patient s wound care needs. FACE TO FACE ENCOUNTER: MEDICARE and MEDICAID PATIENTS: I certify that  this patient is under my care and that I had a face-to-face encounter that meets the physician face-to-face encounter requirements with this patient on this date. The encounter with the patient was in whole or in part for the following MEDICAL CONDITION: (primary reason for [place]) MEDICAL NECESSITY: I certify, that based on my findings, NURSING services are a medically necessary home health service. HOME BOUND STATUS: I certify that my clinical findings support that this patient is homebound (i.e., Due to illness or injury, pt requires aid of supportive devices such as crutches, cane, wheelchairs, walkers, the use of special transportation or the assistance of another person to leave their place of residence. There is a normal inability to leave the home and doing so requires considerable and taxing effort. Other absences are for medical reasons / religious services and are infrequent or of short duration when for other reasons). If current dressing causes regression in wound condition, may D/C ordered dressing product/s and apply Normal Saline Moist Dressing daily until next [place] / Other MD appointment. [place] of regression in wound condition at [phone]. Please direct any NON-WOUND related issues/requests for orders to patient's Primary Care Physician [place] ([id number]) On his right arm I have recommended a piece of Hydoactive B Kerlix and Coban to be left intact for the week. On his left medial ankle we will use Sorbact and foam and will put a Kerlix and Coban to reduce his edema. This will be changed by home health and we will get this done 3 times a week. we are awaiting his vascular studies and if no vascular intervention is needed he may need another round of a skin substitute possibly Theraskin. We will look to see if this is possible. Electronic Signature(s) Signed: 03/27/2015 3:46:43 PM By: Christin Fudge MD,  FACS Previous  Signature: 03/21/2015 3:30:10 PM Version By: Christin Fudge MD, FACS Previous Signature: 03/21/2015 10:35:58 AM Version By: Christin Fudge MD, FACS Entered By: Christin Fudge on 03/27/2015 15:46:43 Philip [name] ([id number]) -------------------------------------------------------------------------------- SuperBill Details Patient Name: CHON, STANDARD. Date of Service: 03/21/2015 Medical Record Number: [id number] Patient Account Number: [id number] Date of Birth/Sex: 1919-02-27 (81 y.o. 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 S41.111A Laceration without foreign body of right upper arm, initial encounter Facility Procedures The patient participates with Medicare or their insurance follows the Medicare Facility Guidelines: CPT4 Description Modifier Quantity Code [id number] 11042 - DEB SUBQ TISSUE 20 SQ CM/< 1 ICD-10 Description Diagnosis I70.232 Atherosclerosis of native  arteries of right leg with ulceration of calf I82.401 Acute embolism and thrombosis of unspecified deep veins of right lower extremity S91.301A Unspecified open wound, right foot, initial encounter Z92.21 Personal history of antineoplastic chemotherapy Physician Procedures CPT4: Description Modifier Quantity Code [id number] 11042 - WC PHYS SUBQ TISS 20 SQ CM 1 ICD-10 Description Diagnosis I70.232 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  I82.401 Acute embolism and thrombosis of unspecified deep  veins of right lower extremity S91.301A Unspecified open wound, right foot, initial encounter Z92.21 Personal history of antineoplastic chemotherapy JUANPABLO, ROSSMILLER ([id number]) Electronic Signature(s) Signed: 03/21/2015 10:36:12 AM By: Christin Fudge MD, FACS Entered By: Christin Fudge on 03/21/2015 10:36:12

## 2015-03-28 ENCOUNTER — Encounter: Payer: Medicare Other | Admitting: Surgery

## 2015-03-28 DIAGNOSIS — I70232 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: Secondary | ICD-10-CM | POA: Diagnosis not present

## 2015-04-02 NOTE — Progress Notes (Signed)
BENYAM, SPROUL ([id number]) Visit Report for 03/28/2015 Arrival Information Details Patient Name: Philip [name], Philip [name]. Date of Service: 03/28/2015 9:30 AM Medical Record Number: [id number] Patient Account Number: [id number] Date of Birth/Sex: 1918/09/17 (81 y.o. Male) Treating RN: Ahmed Prima Primary Care Physician: Hortencia Pilar Other Clinician: Referring Physician: Hortencia Pilar Treating Physician/Extender: Frann Rider in Treatment: 5 Visit Information History Since Last Visit All ordered tests and consults were completed: No Patient Arrived: [name] Added or deleted any medications: No Arrival Time: 09:55 Any new allergies or adverse reactions: No Accompanied By: son Had a fall or experienced change in No Transfer Assistance: EasyPivot Patient activities of daily living that may affect Lift risk of falls: Patient Identification Verified: Yes Signs or symptoms of abuse/neglect since last No Secondary Verification Process Yes visito Completed: Hospitalized since last visit: No Patient Requires Transmission- No Pain Present Now: No Based Precautions: Patient Has Alerts: Yes Patient Alerts: Patient on Blood Thinner No ABIs dt LE blood clots Electronic Signature(s) Signed: 04/01/2015 3:59:50 PM By: Alric Quan Entered By: Alric Quan on 03/28/2015 09:55:44 Philip [name] ([id number]) -------------------------------------------------------------------------------- Encounter Discharge Information Details Patient Name: Philip [name], Philip [name]. Date of Service: 03/28/2015 9:30 AM Medical Record Number: [id number] Patient Account Number: [id number] Date of Birth/Sex: December 28, 1918 (81 y.o. Male) Treating RN: Ahmed Prima Primary Care Physician: Hortencia Pilar Other Clinician: Referring Physician: Hortencia Pilar Treating Physician/Extender: Frann Rider in Treatment: 36 Encounter Discharge Information Items Discharge Pain Level: 0 Discharge Condition:  Stable Ambulatory Status: Walker Discharge Destination: Home Transportation: Private Auto Accompanied By: son Schedule Follow-up Appointment: Yes Medication Reconciliation completed Yes and provided to Patient/Care Camielle Sizer: Provided on Clinical Summary of Care: 03/28/2015 Form Type Recipient Paper Patient GT Electronic Signature(s) Signed: 03/28/2015 11:01:11 AM By: Ruthine Dose Entered By: Ruthine Dose on 03/28/2015 11:01:11 Philip [name] ([id number]) -------------------------------------------------------------------------------- General Visit Notes Details Patient Name: Philip [name]. Date of Service: 03/28/2015 9:30 AM Medical Record Number: [id number] Patient Account Number: [id number] Date of Birth/Sex: Sep 06, 1918 (81 y.o. Male) Treating RN: Ahmed Prima Primary Care Physician: Hortencia Pilar Other Clinician: Referring Physician: Hortencia Pilar Treating Physician/Extender: Frann Rider in Treatment: 37 Notes Marga Hoots nurse with [facility] called and was requesting orders for home health to see pt 2 days a week. Also if home health is unable to get RTD, if they could use hydroferra blue instead. Jeani Hawking stated she would call me back and let me know about the RTD. I asked Dr. Con Memos about these orders and he said that was fine but he said that he really wanted pt to use the RTD if they could get that. Pt will be coming here to [facility] once a week for his wound care. Electronic Signature(s) Signed: 04/01/2015 3:59:50 PM By: Alric Quan Previous Signature: 04/01/2015 1:05:52 PM Version By: Alric Quan Entered By: Alric Quan on 04/01/2015 13:06:06 Philip [name] ([id number]) -------------------------------------------------------------------------------- Lower Extremity Assessment Details Patient Name: Philip [name], Philip [name]. Date of Service: 03/28/2015 9:30 AM Medical Record Number: [id number] Patient Account Number: [id number] Date of  Birth/Sex: October 23, 1918 (81 y.o. Male) Treating RN: Ahmed Prima Primary Care Physician: Hortencia Pilar Other Clinician: Referring Physician: Hortencia Pilar Treating Physician/Extender: Frann Rider in Treatment: 35 Edema Assessment Assessed: [Left: No] [Right: No] E[Left: dema] [Right: :] Calf Left: Right: Point of Measurement: 38 cm From Medial Instep 33.7 cm cm Ankle Left: Right: Point of Measurement: 9 cm From Medial Instep 24.1 cm cm Vascular Assessment Pulses: Posterior Tibial Dorsalis Pedis Doppler: [Left:Multiphasic] Extremity colors, hair growth, and  conditions: Extremity Color: [Left:Mottled] Hair Growth on Extremity: [Left:No] Temperature of Extremity: [Left:Warm] Capillary Refill: [Left:< 3 seconds] Toe Nail Assessment Left: Right: Thick: No Discolored: Yes Deformed: No Improper Length and Hygiene: No Electronic Signature(s) Signed: 04/01/2015 3:59:50 PM By: Alric Quan Entered By: Alric Quan on 03/28/2015 10:08:05 Philip [name] ([id number]) -------------------------------------------------------------------------------- Multi Wound Chart Details Patient Name: Philip [name]. Date of Service: 03/28/2015 9:30 AM Medical Record Number: [id number] Patient Account Number: [id number] Date of Birth/Sex: 1918-12-27 (81 y.o. 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] [5R:Skin Tear]  [N/A:N/A] Comorbid History: [4:Cataracts, Arrhythmia, Congestive Heart Failure] [5R:Cataracts, Arrhythmia, Congestive Heart Failure] [N/A:N/A] Date Acquired: [4:09/20/2014] [5R:09/22/2014] [N/A:N/A] Weeks of Treatment: [4:27] [5R:25] [N/A:N/A] Wound Status: [4:Open] [5R:Open] [N/A:N/A] Wound Recurrence: [4:No] [5R:Yes] [N/A:N/A] Measurements L x W x D 3x2.5x0.3 [5R:4.5x7x0.1] [N/A:N/A] (cm) Area (cm) : [4:5.89] [5R:24.74] [N/A:N/A] Volume (cm) : [4:1.767] [5R:2.474] [N/A:N/A] % Reduction in Area: [4:-3651.60%] [5R:-293.80%] [N/A:N/A] % Reduction in Volume: -10943.70% [5R:-293.90%] [N/A:N/A] Classification: [4:Full Thickness With Exposed Support Structures] [5R:Partial Thickness] [N/A:N/A] Exudate Amount: [4:Large] [5R:Large] [N/A:N/A] Exudate Type: [4:Serous] [5R:Serosanguineous] [N/A:N/A] Exudate Color: [4:amber] [5R:red, brown] [N/A:N/A] Wound Margin: [4:Distinct, outline attached] [5R:Flat and Intact] [N/A:N/A] Granulation Amount: [4:None Present (0%)] [5R:Large (67-100%)] [N/A:N/A] Granulation Quality: [4:N/A] [5R:Red, Pink] [N/A:N/A] Necrotic Amount: [4:Large (67-100%)] [5R:None Present (0%)] [N/A:N/A] Exposed Structures: [4:Fascia: No Fat: No Tendon: No Muscle: No] [5R:Fascia: No Fat: No Tendon: No Muscle: No] [N/A:N/A] Joint: No Joint: No Bone: No Bone: No Limited to Skin Limited to Skin Breakdown Breakdown Epithelialization: None Medium (34-66%) N/A Periwound Skin Texture: Edema: Yes Edema: Yes N/A Excoriation: No Induration: No Callus: No Crepitus: No Fluctuance: No Friable: No Rash: No Scarring: No Periwound Skin Maceration: Yes Moist: Yes N/A Moisture: Moist: Yes Maceration: No Dry/Scaly: No Periwound Skin Color: Erythema: Yes Atrophie Blanche: No N/A Hemosiderin Staining: Yes Cyanosis: No Ecchymosis: No Erythema: No Hemosiderin Staining: No Mottled: No Pallor: No Rubor: No Erythema Location: Circumferential N/A N/A Temperature: No Abnormality No  Abnormality N/A Tenderness on Yes No N/A Palpation: Wound Preparation: Ulcer Cleansing: Other: Ulcer Cleansing: N/A soap and water Rinsed/Irrigated with Saline Topical Anesthetic Applied: Other: lidocaine Topical Anesthetic 4% Applied: None, Other: lidocaine 4% Treatment Notes Electronic Signature(s) Signed: 04/01/2015 3:59:50 PM By: Alric Quan Entered By: Alric Quan on 03/28/2015 10:21:53 Philip [name] ([id number]) -------------------------------------------------------------------------------- [place] Details Patient Name: Philip [name], Philip [name]. Date of Service: 03/28/2015 9:30 AM Medical Record Number: [id number] Patient Account Number: [id number] Date of Birth/Sex: 04-25-1918 (81 y.o. Male) Treating RN: Ahmed Prima Primary Care Physician: Hortencia Pilar Other Clinician: Referring Physician: Hortencia Pilar Treating Physician/Extender: Frann Rider in Treatment: 40 Active Inactive Abuse / Safety / Falls / Self Care Management Nursing Diagnoses: Abuse or neglect; actual or potential Potential for falls Goals: Patient will remain injury free Date Initiated: 05/02/2014 Goal Status: Active Interventions: Assess fall risk on admission and as needed Notes: Necrotic Tissue Nursing Diagnoses: Impaired tissue integrity related to necrotic/devitalized tissue Goals: Necrotic/devitalized tissue will be minimized in the wound bed Date Initiated: 05/02/2014 Goal Status: Active Interventions: Assess patient pain level pre-, during and post procedure and prior to discharge Treatment  Activities: Apply topical anesthetic as ordered : 03/28/2015 Notes: Orientation to the Wound Care Program Nursing Diagnoses: Knowledge deficit related to the wound healing center program REY, KUCZEK ([id number]) Goals: Patient/caregiver will verbalize understanding of the [place] Date Initiated: 05/02/2014 Goal Status:  Active Interventions: Provide education on orientation to the wound center Notes: Electronic Signature(s) Signed: 04/01/2015 3:59:50 PM By: Alric Quan Entered By: Alric Quan on 03/28/2015 10:20:34 Philip [name] ([id number]) -------------------------------------------------------------------------------- Pain Assessment Details Patient Name: Philip [name]. Date of Service: 03/28/2015 9:30 AM Medical Record Number: [id number] Patient Account Number: [id number] Date of Birth/Sex: 12/14/1918 (81 y.o. Male) Treating RN: Ahmed Prima Primary Care Physician: Hortencia Pilar Other Clinician: Referring Physician: Hortencia Pilar Treating Physician/Extender: Frann Rider in Treatment: 77 Active Problems Location of Pain Severity and Description of Pain Patient Has Paino No Site Locations Pain Management and Medication Current Pain Management: Electronic Signature(s) Signed: 04/01/2015 3:59:50 PM By: Alric Quan Entered By: Alric Quan on 03/28/2015 09:55:52 Philip [name] ([id number]) -------------------------------------------------------------------------------- Patient/Caregiver Education Details Patient Name: TAZZ, BURGOS. Date of Service: 03/28/2015 9:30 AM Medical Record Number: [id number] Patient Account Number: [id number] Date of Birth/Gender: Mar 06, 1919 (81 y.o. Male) Treating RN: Ahmed Prima Primary Care Physician: Hortencia Pilar Other Clinician: Referring Physician: Hortencia Pilar Treating Physician/Extender: Frann Rider in Treatment: 66 Education Assessment Education Provided To: Patient and Caregiver Education Topics Provided Wound/Skin Impairment: Handouts: Other: change dressing as directed Methods: Demonstration, Explain/Verbal Responses: State content correctly Electronic Signature(s) Signed: 04/01/2015 3:59:50 PM By: Alric Quan Entered By: Alric Quan on 03/28/2015 11:00:42 Philip [name]  ([id number]) -------------------------------------------------------------------------------- Wound Assessment Details Patient Name: Philip [name], Philip [name]. Date of Service: 03/28/2015 9:30 AM Medical Record Number: [id number] Patient Account Number: [id number] Date of Birth/Sex: 07/31/1918 (81 y.o. Male) Treating RN: Ahmed Prima Primary Care Physician: Hortencia Pilar Other Clinician: Referring Physician: Hortencia Pilar Treating Physician/Extender: Frann Rider in Treatment: 45 Wound Status Wound Number: 4 Primary Malignant Wound Etiology: Wound Location: Left Malleolus - Medial Wound Status: Open Wounding Event: Other Lesion Comorbid Cataracts, Arrhythmia, Congestive Date Acquired: 09/20/2014 History: Heart Failure Weeks Of Treatment: 27 Clustered Wound: No Photos Photo Uploaded By: Alric Quan on 03/28/2015 16:43:32 Wound Measurements Length: (cm) 3 Width: (cm) 2.5 Depth: (cm) 0.3 Area: (cm) 5.89 Volume: (cm) 1.767 % Reduction in Area: -3651.6% % Reduction in Volume: -10943.7% Epithelialization: None Tunneling: No Undermining: No Wound Description Full Thickness With Exposed Foul Odor Afte Classification: Support Structures Wound Margin: Distinct, outline attached Exudate Large Amount: Exudate Type: Serous Exudate Color: amber r Cleansing: No Wound Bed Granulation Amount: None Present (0%) Exposed Structure Necrotic Amount: Large (67-100%) Fascia Exposed: No Necrotic Quality: Adherent Slough Fat Layer Exposed: No Philip [name], Philip [name] ([id number]) Tendon Exposed: No Muscle Exposed: No Joint Exposed: No Bone Exposed: No Limited to Skin Breakdown Periwound Skin Texture Texture Color No Abnormalities Noted: No No Abnormalities Noted: No Localized Edema: Yes Erythema: Yes Erythema Location: Circumferential Moisture Hemosiderin Staining: Yes No Abnormalities Noted: No Maceration: Yes Temperature / Pain Moist: Yes Temperature: No  Abnormality Tenderness on Palpation: Yes Wound Preparation Ulcer Cleansing: Other: soap and water, Topical Anesthetic Applied: Other: lidocaine 4%, Treatment Notes Wound #4 (Left, Medial Malleolus) 1. Cleansed with: Clean wound with Normal Saline 2. Anesthetic Topical Lidocaine 4% cream to wound bed prior to debridement 4. Dressing Applied: Other dressing (specify in notes) 5. Secondary Dressing Applied Bordered Foam Dressing Kerlix/Conform 7. Secured with 2 Layer Compression System - Bilateral Notes kerlix, coban, sorbact, hydroactive B Electronic Signature(s) Signed:  04/01/2015 3:59:50 PM By: Alric Quan Entered By: Alric Quan on 03/28/2015 10:19:26 Philip [name] ([id number]) -------------------------------------------------------------------------------- Wound Assessment Details Patient Name: Philip [name], Philip [name]. Date of Service: 03/28/2015 9:30 AM Medical Record Number: [id number] Patient Account Number: [id number] Date of Birth/Sex: 1919-01-22 (81 y.o. Male) Treating RN: Ahmed Prima Primary Care Physician: Hortencia Pilar Other Clinician: Referring Physician: Hortencia Pilar Treating Physician/Extender: Frann Rider in Treatment: 72 Wound Status Wound Number: 5R Primary Skin Tear Etiology: Wound Location: Right Forearm - Lateral Wound Status: Open Wounding Event: Shear/Friction Comorbid Cataracts, Arrhythmia, Congestive Date Acquired: 09/22/2014 History: Heart Failure Weeks Of Treatment: 25 Clustered Wound: No Photos Photo Uploaded By: Alric Quan on 03/28/2015 16:43:47 Wound Measurements Length: (cm) 4.5 Width: (cm) 7 Depth: (cm) 0.1 Area: (cm) 24.74 Volume: (cm) 2.474 % Reduction in Area: -293.8% % Reduction in Volume: -293.9% Epithelialization: Medium (34-66%) Tunneling: No Undermining: No Wound Description Classification: Partial Thickness Wound Margin: Flat and Intact Exudate Amount: Large Exudate Type:  Serosanguineous Exudate Color: red, brown Foul Odor After Cleansing: No Wound Bed Granulation Amount: Large (67-100%) Exposed Structure Granulation Quality: Red, Pink Fascia Exposed: No Necrotic Amount: None Present (0%) Fat Layer Exposed: No Tendon Exposed: No Philip [name], Philip [name] ([id number]) Muscle Exposed: No Joint Exposed: No Bone Exposed: No Limited to Skin Breakdown Periwound Skin Texture Texture Color No Abnormalities Noted: No No Abnormalities Noted: No Callus: No Atrophie Blanche: No Crepitus: No Cyanosis: No Excoriation: No Ecchymosis: No Fluctuance: No Erythema: No Friable: No Hemosiderin Staining: No Induration: No Mottled: No Localized Edema: Yes Pallor: No Rash: No Rubor: No Scarring: No Temperature / Pain Moisture Temperature: No Abnormality No Abnormalities Noted: No Dry / Scaly: No Maceration: No Moist: Yes Wound Preparation Ulcer Cleansing: Rinsed/Irrigated with Saline Topical Anesthetic Applied: None, Other: lidocaine 4%, Treatment Notes Wound #5R (Right, Lateral Forearm) 1. Cleansed with: Clean wound with Normal Saline 2. Anesthetic Topical Lidocaine 4% cream to wound bed prior to debridement 4. Dressing Applied: Other dressing (specify in notes) 5. Secondary Dressing Applied Bordered Foam Dressing Kerlix/Conform 7. Secured with 2 Layer Compression System - Bilateral Notes kerlix, coban, sorbact, hydroactive B Electronic Signature(s) Signed: 04/01/2015 3:59:50 PM By: Alric Quan Entered By: Alric Quan on 03/28/2015 10:20:20 Philip [name] ([id number], Philip [name] ([id number]) -------------------------------------------------------------------------------- Vitals Details Patient Name: Philip [name], Philip [name]. Date of Service: 03/28/2015 9:30 AM Medical Record Number: [id number] Patient Account Number: [id number] Date of Birth/Sex: May 29, 1918 (81 y.o. Male) Treating RN: Ahmed Prima Primary Care Physician: Hortencia Pilar Other Clinician: Referring Physician: Hortencia Pilar Treating Physician/Extender: Frann Rider in Treatment: 33 Vital Signs Time Taken: 09:56 Temperature (F): 97.6 Height (in): 69 Pulse (bpm): 74 Weight (lbs): 179 Respiratory Rate (breaths/min): 18 Body Mass Index (BMI): 26.4 Blood Pressure (mmHg): 129/42 Reference Range: 80 - 120 mg / dl Electronic Signature(s) Signed: 04/01/2015 3:59:50 PM By: Alric Quan Entered By: Alric Quan on 03/28/2015 09:57:50

## 2015-04-02 NOTE — Progress Notes (Signed)
Philip [name], Philip [name] ([id number]) Visit Report for 03/28/2015 Chief Complaint Document Details Patient Name: Philip [name], Philip [name]. Date of Service: 03/28/2015 9:30 AM Medical Record Number: [id number] Patient Account Number: [id number] Date of Birth/Sex: [birth year] (81 y.o. Male) Treating RN: Philip [name] Primary Care Physician: Philip [name] Other Clinician: Referring Physician: Hortencia [name] Treating Physician/Extender: Philip [name] in Treatment: 11 Information Obtained from: Patient Chief Complaint R foot ulcer. L forearm ulcer. 07/19/2014 -- about 2 weeks ago he had a surgical procedure done by dermatologist in [place] and has an open surgical wound on the dorsum of the right foot. Electronic Signature(s) Signed: 03/28/2015 10:45:24 AM By: Philip Fudge MD, FACS Entered By: Philip [name] on 03/28/2015 10:45:23 Philip [name] ([id number]) -------------------------------------------------------------------------------- Debridement Details Patient Name: Philip [name], Philip [name]. Date of Service: 03/28/2015 9:30 AM Medical Record Number: [id number] Patient Account Number: [id number] Date of Birth/Sex: [birth year] (81 y.o. Male) Treating RN: Philip [name] Primary Care Physician: Philip [name] Other Clinician: Referring Physician: Hortencia [name] Treating Physician/Extender: Philip [name] in Treatment: 73 Debridement Performed for Wound #4 Left,Medial Malleolus Assessment: Performed By: Physician Philip Fudge, MD Debridement: Debridement Pre-procedure Yes Verification/Time Out Taken: Start Time: 10:30 Pain Control: Other : lidocaine 4% Level: Skin/Subcutaneous Tissue Total Area Debrided (L x 3 (cm) x 2.5 (cm) = 7.5 (cm) W): Tissue and other Viable, Non-Viable, Exudate, Fibrin/Slough, Subcutaneous material debrided: Instrument: Curette Bleeding: Minimum Hemostasis Achieved: Pressure End Time: 10:34 Procedural Pain: 2 Post Procedural Pain: 2 Response to Treatment:  Procedure was tolerated well Post Debridement Measurements of Total Wound Length: (cm) 3 Width: (cm) 2.5 Depth: (cm) 0.4 Volume: (cm) 2.356 Post Procedure Diagnosis Same as Pre-procedure Electronic Signature(s) Signed: 03/28/2015 10:45:14 AM By: Philip Fudge MD, FACS Signed: 04/01/2015 3:59:50 PM By: Philip [name] Entered By: Philip [name] on 03/28/2015 10:45:14 Philip [name] ([id number]) -------------------------------------------------------------------------------- HPI Details Patient Name: Philip [name], Philip [name]. Date of Service: 03/28/2015 9:30 AM Medical Record Number: [id number] Patient Account Number: [id number] Date of Birth/Sex: [birth year] (81 y.o. Male) Treating RN: Philip [name] Primary Care Physician: Philip [name] Other Clinician: Referring Physician: Hortencia [name] Treating Physician/Extender: Philip [name] in Treatment: 14 History of Present Illness Location: right leg Duration: Dec 2015 Modifying Factors: history of an injury to the right leg with resulting hematoma and thrombophlebitis and later an ulcer of posterior leg Associated Signs and Symptoms: marked lymphedema of the right leg. He is already on Eloquis. HPI Description: 06/14/14 -- He returns for followup today. He denies any fevers. no fresh issues and his daughter says he's been doing fine. 06/21/14 -- after he sustained a fall this week earlier he applied a bandage over this himself and did not seek any medical attention. he did however manage to control the bleeding and had a dressing in place the next morning when his son to the visit. In this dressing was removed there was further damaged skin. His right leg has been doing fine otherwise. 07/12/14 --Very pleasant 81 year old with past medical history significant for congestive heart failure (EF 15%), peripheral vascular disease, and chronic kidney disease. He was hospitalized at [facility] in December 2015 for congestive heart failure. He says that  he fell during his hospital course and developed a hematoma over his right calf. He was also diagnosed with a right lower extremity DVT for which he takes Eliquis. The hematoma subsequently turned into an ulceration around Christmas, which has healed. He subsequently developed an ulcer on his right dorsal foot and a traumatic left forearm ulcer. Per his report, he underwent biopsy  of the right dorsal foot ulceration which demonstrated a skin cancer. His PCP and dermatologist office are both closed today. I reviewed his records in [place] but find no report of biopsy or pathology. He s without complaints today. No significant pain. No fever or chills. Minimal drainage. 07/19/2014 - the patient and his son tell me that about 2 weeks ago the dermatologist did a skin biopsy and this was a large area on the dorsum of his right foot which was left open and no dressing instructions were recommended. Since then he has been called and told that it is a cancer and the need to do a further procedure but that will not happen until about 2 weeks from now. In the meanwhile the patient has not been taking care of his right foot. The left forearm where he had an abrasion and laceration is doing very well. 07/26/2014 -- Reports from 07/08/2014 from the dermatology group reviewed. A excision was done of a lesion located on the dorsum of the right foot and this was 1.7 cm in diameter which was a shave biopsy performed. The wound was left open after appropriate cauterization and the patient was given this dressing instructions. 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 08/02/2014 -- all the original problems he came with have completely resolved. He now has a surgical wound on his right foot dorsum where a skin cancer was excised. He goes to see his dermatologist this Philip, [name] ([id number]) coming Tuesday and will have  definite news next Friday. 08/09/2014 he had gone to his dermatologist on Tuesday and she has injected the base of his ulcer with some chemotherapeutic agent. He was supposed to bring some papers with him but forgot to get them and will bring them in next week. Other than that the dermatologist had suggested using Mehdi honey on the wound. 08/16/2014 -- the patient has brought in his notes from the dermatologist and on 08/06/2014 he received a injection of 5 FU, 500 mg grams into the lesion. the pathology report was also sent and it was a squamous cell carcinoma well-differentiated and edges were involved. They wanted him to use many honey for the wound dressing changes to be done 3 times a week. 08/30/2014 -- he has finished his second injection of 5-FU and has the next one in 2 weeks' time. He is doing fine otherwise. 09/13/2014 - No new complaints. No significant pain. No fever or chills. Minimal drainage. Still receiving 5-FU injections. 09/20/2014 -- He was seen by the dermatologist on 09/10/2014 and Dr. Phillip Heal injected his foot both the right on the dorsum and left near the medial malleolus with 5-FU. The next dose of 5-FU is to be given after 3 months. The patient says he now has a spot on the left medial malleolus where he was injected with 5-FU. 09/27/2014 -- the area on the left ankle where he was injected with 5-FU is now a full-blown ulcer. He also has mild pain in both ankle areas. 10/04/2014 - large had a bit of a fall and injured his right arm last evening and has had a laceration with no evidence of any foreign body in the right forearm. 12/20/2014 -- he recently saw his dermatologist at [facility] and she was pleased with his wound healing on the right lower extremity. She has not given him any further 5-FU injections and will see him back on a when necessary basis.  01/03/2015 -- his skin substitute Grafix has been approved by his insurance and we will go ahead with this next  week. 01/10/2015 -- he has his first application of Grafix today. he recently had a nightmare and injured his right forearm and had some abrasions. 01/13/2015 -- Iona Beard has developed significant swelling of the left calf with some tenderness of the calf. This was only noticed this morning. Addendum: The DVT study done in the hospital -- IMPRESSION:No evidence of deep venous thrombosis left lower extremity. The result was called into the patient's son who acknowledges the report and will bring the patient back on Friday 01/17/2015 -- he saw his PCP Dr. Hoy Morn and she has increases dosage of Lasix and his edema on the left lower extremity is looking much better. He is here for a second applications of Grafix [id number] -- he is overall doing very well his edema has come down significantly. He is here for his third application of grafix. 02/04/2015 -- he has no new issues and is here for his fourth application of grafix [id number] -- he is here for a wound review today and has no fresh issues. 02/21/2015 -- he has no new issues and is here for his fifth and last application of grafix. 03/21/2015 -- he has had one of the vascular test done last week and is awaiting the second one. Philip [name], Philip [name] ([id number]) than that his health has not changed in any way. 03/28/2015 -- we tried hard but no vascular reports are available yet and his next test is on December 29. We've had him authorized for [place], but I'm awaiting his vascular workup and I believe it won't be too early January when we will take a decision regarding this. Electronic Signature(s) Signed: 03/28/2015 10:46:29 AM By: Philip Fudge MD, FACS Entered By: Philip [name] on 03/28/2015 10:46:29 Philip [name] ([id number]) -------------------------------------------------------------------------------- Physical Exam Details Patient Name: Philip [name], Philip [name]. Date of Service: 03/28/2015 9:30 AM Medical Record Number:  [id number] Patient Account Number: [id number] Date of Birth/Sex: 1918/05/16 (81 y.o. Male) Treating RN: Philip [name] Primary Care Physician: Philip [name] Other Clinician: Referring Physician: Hortencia [name] Treating Physician/Extender: Philip [name] in Treatment: 31 Constitutional . Pulse regular. Respirations normal and unlabored. Afebrile. . Eyes Nonicteric. Reactive to light. Ears, Nose, Mouth, and Throat Lips, teeth, and gums WNL.Marland Kitchen Moist mucosa without lesions . Neck supple and nontender. No palpable supraclavicular or cervical adenopathy. Normal sized without goiter. Respiratory WNL. No retractions.. Cardiovascular Pedal Pulses WNL. No clubbing, cyanosis or edema. Lymphatic No adneopathy. No adenopathy. No adenopathy. Musculoskeletal Adexa without tenderness or enlargement.. Digits and nails w/o clubbing, cyanosis, infection, petechiae, ischemia, or inflammatory conditions.. Integumentary (Hair, Skin) No suspicious lesions. No crepitus or fluctuance. No peri-wound warmth or erythema. No masses.Marland Kitchen Psychiatric Judgement and insight Intact.. No evidence of depression, anxiety, or agitation.. Notes the right arm continues to look better and we will continue with local care as there is no maceration and the ulcer is almost healed. The left medial ankle is larger and there is slough which is sharply debrided with a curet down to the subcutaneous tissue and fascia. There continues to be islands of good granulation tissue. Electronic Signature(s) Signed: 03/28/2015 10:47:22 AM By: Philip Fudge MD, FACS Entered By: Philip [name] on 03/28/2015 10:47:22 Philip [name] ([id number]) -------------------------------------------------------------------------------- Physician Orders Details Patient Name: Philip [name], Philip [name]. Date of Service: 03/28/2015 9:30 AM Medical Record Number: [id number] Patient Account Number: [id number] Date of Birth/Sex: 07-19-1918 (81 y.o. Male) Treating  RN: Carolyne Fiscal,  Debi Primary Care Physician: Philip [name] Other Clinician: Referring Physician: Hortencia [name] Treating Physician/Extender: Philip [name] in Treatment: 44 Verbal / Phone Orders: No Diagnosis Coding Wound Cleansing Wound #4 Left,Medial Malleolus o Cleanse wound with mild soap and water Wound #5R Right,Lateral Forearm o Cleanse wound with mild soap and water Anesthetic Wound #4 Left,Medial Malleolus o Topical Lidocaine 4% cream applied to wound bed prior to debridement Wound #5R Right,Lateral Forearm o Topical Lidocaine 4% cream applied to wound bed prior to debridement Primary Wound Dressing Wound #4 Left,Medial Malleolus o Other: - RTD Wound #5R Right,Lateral Forearm o Other: - Hydroactive B Secondary Dressing Wound #4 Left,Medial Malleolus o Boardered Foam Dressing Wound #5R Right,Lateral Forearm o Gauze, ABD and Kerlix/Conform Dressing Change Frequency Wound #4 Left,Medial Malleolus o Change dressing every day. Wound #5R Right,Lateral Forearm o Change dressing every week Follow-up Appointments Philip [name], Philip [name] ([id number]) Wound #4 Left,Medial Malleolus o Return Appointment in 1 week. Wound #5R Right,Lateral Forearm o Return Appointment in 1 week. Edema Control o 2 Layer Compression System - Left Lower Extremity - kerlix, coban Home Health Wound #4 [place] Nurse may visit PRN to address patientos wound care needs. o FACE TO FACE ENCOUNTER: MEDICARE and MEDICAID PATIENTS: I certify that this patient is under my care and that I had a face-to-face encounter that meets the physician face-to-face encounter requirements with this patient on this date. The encounter with the patient was in whole or in part for the following MEDICAL CONDITION: (primary reason for [place]) MEDICAL NECESSITY: I certify, that based on my findings, NURSING services are a  medically necessary home health service. HOME BOUND STATUS: I certify that my clinical findings support that this patient is homebound (i.e., Due to illness or injury, pt requires aid of supportive devices such as crutches, cane, wheelchairs, walkers, the use of special transportation or the assistance of another person to leave their place of residence. There is a normal inability to leave the home and doing so requires considerable and taxing effort. Other absences are for medical reasons / religious services and are infrequent or of short duration when for other reasons). o If current dressing causes regression in wound condition, may D/C ordered dressing product/s and apply Normal Saline Moist Dressing daily until next [place] / Other MD appointment. [place] of regression in wound condition at [phone]. o Please direct any NON-WOUND related issues/requests for orders to patient's Primary Care Physician Wound #5R Right,Lateral Forearm o [place] Nurse may visit PRN to address patientos wound care needs. o FACE TO FACE ENCOUNTER: MEDICARE and MEDICAID PATIENTS: I certify that this patient is under my care and that I had a face-to-face encounter that meets the physician face-to-face encounter requirements with this patient on this date. The encounter with the patient was in whole or in part for the following MEDICAL CONDITION: (primary reason for [place]) MEDICAL NECESSITY: I certify, that based on my findings, NURSING services are a medically necessary home health service. HOME BOUND STATUS: I certify that my clinical findings support that this patient is homebound (i.e., Due to illness or injury, pt requires aid of supportive devices such as crutches, cane, wheelchairs, walkers, the use of special transportation or the assistance of another person to leave their place of residence. There is a normal  inability to leave the home and doing so requires considerable and taxing effort. Other absences are  for medical reasons / religious services and are infrequent or of short duration when for other reasons). o If current dressing causes regression in wound condition, may D/C ordered dressing product/s and apply Normal Saline Moist Dressing daily until next [place] / Other MD appointment. [place] of regression in wound condition at [phone]. Philip [name], Philip [name] ([id number]) o Please direct any NON-WOUND related issues/requests for orders to patient's Primary Care Physician Electronic Signature(s) Signed: 03/28/2015 4:13:31 PM By: Philip Fudge MD, FACS Signed: 04/01/2015 3:59:50 PM By: Philip [name] Entered By: Philip [name] on 03/28/2015 12:15:00 Philip [name] ([id number]) -------------------------------------------------------------------------------- Problem List Details Patient Name: Philip [name], Philip [name]. Date of Service: 03/28/2015 9:30 AM Medical Record Number: [id number] Patient Account Number: [id number] Date of Birth/Sex: 1919-02-24 (81 y.o. Male) Treating RN: Philip [name] Primary Care Physician: Philip [name] Other Clinician: Referring Physician: Hortencia [name] Treating Physician/Extender: Philip [name] in Treatment: 12 Active Problems ICD-10 Encounter Code Description Active Date Diagnosis I70.232 Atherosclerosis of native arteries of right leg with 06/21/2014 Yes ulceration of calf I82.401 Acute embolism and thrombosis of unspecified deep veins 06/21/2014 Yes of right lower extremity S91.301A Unspecified open wound, right foot, initial encounter 07/19/2014 Yes Z92.21 Personal history of antineoplastic chemotherapy 08/23/2014 Yes L97.522 Non-pressure chronic ulcer of other part of left foot with fat 09/20/2014 Yes layer exposed S41.111A Laceration without foreign body of right upper arm, initial 10/04/2014 Yes encounter Inactive  Problems Resolved Problems ICD-10 Code Description Active Date Resolved Date L97.212 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 Date of Service: 03/28/2015 9:30 AM Medical Record Number: [id number] Patient Account Number: [id number] Date of Birth/Sex: Jan 14, 1919 (81 y.o. Male) Treating RN: Philip [name] Primary Care Physician: Philip [name] Other Clinician: Referring Physician: Hortencia [name] Treating Physician/Extender: Philip [name] in Treatment: 15 Subjective Chief Complaint Information obtained from Patient R foot ulcer. L forearm ulcer. 07/19/2014 -- about 2 weeks ago he had a surgical procedure done by dermatologist in [place] and has an open surgical wound on the dorsum of the right foot. History of Present Illness (HPI) The following HPI elements were documented for the patient's wound: Location: right leg Duration: Dec 2015 Modifying Factors: history of an injury to the right leg with resulting hematoma and thrombophlebitis and later an ulcer of posterior leg Associated Signs and Symptoms: marked lymphedema of the right leg. He is already on Eloquis. 06/14/14 -- He returns for followup today. He denies any fevers. no fresh issues and his daughter says he's been doing fine. 06/21/14 -- after he sustained a fall this week earlier he applied a bandage over this himself and did not seek any medical attention. he did however manage to control the bleeding and had a dressing in place the next morning when his son to the  visit. In this dressing was removed there was further damaged skin. His right leg has been doing fine otherwise. 07/12/14 --Very pleasant 81 year old with past medical history significant for congestive heart failure (EF 15%), peripheral vascular disease, and chronic kidney disease. He was hospitalized at [facility] in December 2015 for congestive heart failure. He says that he fell during his hospital course and developed a hematoma over his right calf. He was  also diagnosed with a right lower extremity DVT for which he takes Eliquis. The hematoma subsequently turned into an ulceration around Christmas, which has healed. He subsequently developed an ulcer on his right dorsal foot and a traumatic left forearm ulcer. Per his report, he underwent biopsy of the right dorsal foot ulceration which demonstrated a skin cancer. His PCP and dermatologist office are both closed today. I reviewed his records in [place] but find no report of biopsy or pathology. He s without complaints today. No significant pain. No fever or chills. Minimal drainage. 07/19/2014 - the patient and his son tell me that about 2 weeks ago the dermatologist did a skin biopsy and this was a large area on the dorsum of his right foot which was left open and no dressing instructions were recommended. Since then he has been called and told that it is a cancer and the need to do a further procedure but that will not happen until about 2 weeks from now. In the meanwhile the patient has not been taking care of his right foot. The left forearm where he had an abrasion and laceration is doing very well. Philip [name], BOYTE ([id number]) 07/26/2014 -- Reports from 07/08/2014 from the dermatology group reviewed. A excision was done of a lesion located on the dorsum of the right foot and this was 1.7 cm in diameter which was a shave biopsy performed. The wound was left open after appropriate cauterization and the patient was given this  dressing instructions. 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 08/02/2014 -- all the original problems he came with have completely resolved. He now has a surgical wound on his right foot dorsum where a skin cancer was excised. He goes to see his dermatologist this coming Tuesday and will have definite news next Friday. 08/09/2014 he had gone to his dermatologist on Tuesday and she has injected the base of his ulcer with some chemotherapeutic agent. He was supposed to bring some papers with him but forgot to get them and will bring them in next week. Other than that the dermatologist had suggested using Mehdi honey on the wound. 08/16/2014 -- the patient has brought in his notes from the dermatologist and on 08/06/2014 he received a injection of 5 FU, 500 mg grams into the lesion. the pathology report was also sent and it was a squamous cell carcinoma well-differentiated and edges were involved. They wanted him to use many honey for the wound dressing changes to be done 3 times a week. 08/30/2014 -- he has finished his second injection of 5-FU and has the next one in 2 weeks' time. He is doing fine otherwise. 09/13/2014 - No new complaints. No significant pain. No fever or chills. Minimal drainage. Still receiving 5-FU injections. 09/20/2014 -- He was seen by the dermatologist on 09/10/2014 and Dr. Phillip Heal injected his foot both the right on the dorsum and left near the medial malleolus with 5-FU. The next dose of 5-FU is to be given after 3 months. The patient says he now has a spot on the left medial malleolus where he was injected with 5-FU. 09/27/2014 -- the area on the left ankle where he was injected with 5-FU is now a full-blown ulcer. He also has mild pain in both ankle areas. 10/04/2014 - large had a bit of a fall and injured his right arm last evening and has had a laceration with  no evidence of  any foreign body in the right forearm. 12/20/2014 -- he recently saw his dermatologist at [facility] and she was pleased with his wound healing on the right lower extremity. She has not given him any further 5-FU injections and will see him back on a when necessary basis. 01/03/2015 -- his skin substitute Grafix has been approved by his insurance and we will go ahead with this next week. 01/10/2015 -- he has his first application of Grafix today. he recently had a nightmare and injured his right forearm and had some abrasions. 01/13/2015 -- Iona Beard has developed significant swelling of the left calf with some tenderness of the calf. This was only noticed this morning. Addendum: The DVT study done in the hospital -- IMPRESSION:No evidence of deep venous thrombosis left lower extremity. The result was called into the patient's son who acknowledges the report and will bring the patient back on Friday KHAALIQ, SHARPLEY ([id number]) 01/17/2015 -- he saw his PCP Dr. Hoy Morn and she has increases dosage of Lasix and his edema on the left lower extremity is looking much better. He is here for a second applications of Grafix [id number] -- he is overall doing very well his edema has come down significantly. He is here for his third application of grafix. 02/04/2015 -- he has no new issues and is here for his fourth application of grafix [id number] -- he is here for a wound review today and has no fresh issues. 02/21/2015 -- he has no new issues and is here for his fifth and last application of grafix. 03/21/2015 -- he has had one of the vascular test done last week and is awaiting the second one. Other than that his health has not changed in any way. 03/28/2015 -- we tried hard but no vascular reports are available yet and his next test is on December 29. We've had him authorized for Theraskin, but I'm awaiting his vascular workup and I believe it won't be too early January when we will take  a decision regarding this. Objective Constitutional Pulse regular. Respirations normal and unlabored. Afebrile. Vitals Time Taken: 9:56 AM, Height: 69 in, Weight: 179 lbs, BMI: 26.4, Temperature: 97.6 F, Pulse: 74 bpm, Respiratory Rate: 18 breaths/min, Blood Pressure: 129/42 mmHg. Eyes Nonicteric. Reactive to light. Ears, Nose, Mouth, and Throat Lips, teeth, and gums WNL.Marland Kitchen Moist mucosa without lesions . Neck supple and nontender. No palpable supraclavicular or cervical adenopathy. Normal sized without goiter. Respiratory WNL. No retractions.. Cardiovascular Pedal Pulses WNL. No clubbing, cyanosis or edema. Lymphatic No adneopathy. No adenopathy. No adenopathy. Musculoskeletal Adexa without tenderness or enlargement.. Digits and nails w/o clubbing, cyanosis, infection, petechiae, ischemia, or inflammatory conditions.TAVIS, VANKLEECK ([id number]) Psychiatric Judgement and insight Intact.. No evidence of depression, anxiety, or agitation.. General Notes: the right arm continues to look better and we will continue with local care as there is no maceration and the ulcer is almost healed. The left medial ankle is larger and there is slough which is sharply debrided with a curet down to the subcutaneous tissue and fascia. There continues to be islands of good granulation tissue. Integumentary (Hair, Skin) No suspicious lesions. No crepitus or fluctuance. No peri-wound warmth or erythema. No masses.. Wound #4 status is Open. Original cause of wound was Other Lesion. The wound is located on the Left,Medial Malleolus. The wound measures 3cm length x 2.5cm width x 0.3cm depth; 5.89cm^2 area and 1.767cm^3 volume. The wound is limited to skin breakdown. There is no tunneling or undermining noted. There is a  large amount of serous drainage noted. The wound margin is distinct with the outline attached to the wound base. There is no granulation within the wound bed. There is a large (67-100%)  amount of necrotic tissue within the wound bed including Adherent Slough. The periwound skin appearance exhibited: Localized Edema, Maceration, Moist, Hemosiderin Staining, Erythema. The surrounding wound skin color is noted with erythema which is circumferential. Periwound temperature was noted as No Abnormality. The periwound has tenderness on palpation. Wound #5R status is Open. Original cause of wound was Shear/Friction. The wound is located on the Right,Lateral Forearm. The wound measures 4.5cm length x 7cm width x 0.1cm depth; 24.74cm^2 area and 2.474cm^3 volume. The wound is limited to skin breakdown. There is no tunneling or undermining noted. There is a large amount of serosanguineous drainage noted. The wound margin is flat and intact. There is large (67-100%) red, pink granulation within the wound bed. There is no necrotic tissue within the wound bed. The periwound skin appearance exhibited: Localized Edema, Moist. The periwound skin appearance did not exhibit: Callus, Crepitus, Excoriation, Fluctuance, Friable, Induration, Rash, Scarring, Dry/Scaly, Maceration, Atrophie Blanche, Cyanosis, Ecchymosis, Hemosiderin Staining, Mottled, Pallor, Rubor, Erythema. Periwound temperature was noted as No Abnormality. 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 There was a Skin/Subcutaneous Tissue Debridement [id number])  debridement with total area of 7.5 sq cm performed by Philip Fudge, MD. with the following instrument(s): Curette to remove Viable and Non-Viable tissue/material including Exudate, Fibrin/Slough, and Subcutaneous after achieving pain control using Other (lidocaine 4%). A time out was conducted prior to the start of the procedure. A Minimum amount of bleeding was controlled with Pressure. The procedure was tolerated well with a pain level of 2 throughout and a pain level of 2 following the procedure. Post Debridement Measurements: 3cm length x 2.5cm width x 0.4cm depth; 2.356cm^3 volume. Post procedure Diagnosis Wound #4: Same as Pre-Procedure Plan Wound Cleansing: Wound #4 Left,Medial Malleolus: Cleanse wound with mild soap and water Wound #5R Right,Lateral Forearm: Cleanse wound with mild soap and water Anesthetic: Wound #4 Left,Medial Malleolus: Topical Lidocaine 4% cream applied to wound bed prior to debridement Wound #5R Right,Lateral Forearm: Topical Lidocaine 4% cream applied to wound bed prior to debridement Primary Wound Dressing: Wound #4 Left,Medial Malleolus: Other: - RTD Wound #5R Right,Lateral Forearm: Other: - Hydroactive B Secondary Dressing: Wound #4 Left,Medial Malleolus: Boardered Foam Dressing Wound #5R Right,Lateral Forearm: Gauze, ABD and Kerlix/Conform Dressing Change Frequency: Wound #4 Left,Medial Malleolus: Change dressing every day. Wound #5R Right,Lateral Forearm: Change dressing every week Follow-up Appointments: Wound #4 Left,Medial Malleolus: Return Appointment in 1 week. RODDERICK, ORSAK ([id number]) Wound #5R Right,Lateral Forearm: Return Appointment in 1 week. Edema Control: 2 Layer Compression System - Left Lower Extremity - kerlix, coban Home Health: Wound #4 Left,Medial Malleolus: [place] Nurse may visit PRN to address patient s wound care needs. FACE TO FACE ENCOUNTER: MEDICARE and MEDICAID PATIENTS: I  certify that this patient is under my care and that I  had a face-to-face encounter that meets the physician face-to-face encounter requirements with this patient on this date. The encounter with the patient was in whole or in part for the following MEDICAL CONDITION: (primary reason for [place]) MEDICAL NECESSITY: I certify, that based on my findings, NURSING services are a medically necessary home health service. HOME BOUND STATUS: I certify that my clinical findings support that this patient is homebound (i.e., Due to illness or injury, pt requires aid of supportive devices such as crutches, cane, wheelchairs, walkers, the use of special transportation or the assistance of another person to leave their place of residence. There is a normal inability to leave the home and doing so requires considerable and taxing effort. Other absences are for medical reasons / religious services and are infrequent or of short duration when for other reasons). If current dressing causes regression in wound condition, may D/C ordered dressing product/s and apply Normal Saline Moist Dressing daily until next [place] / Other MD appointment. [place] of regression in wound condition at [phone]. Please direct any NON-WOUND related issues/requests for orders to patient's Primary Care Physician Wound #5R Right,Lateral Forearm: [place] Nurse may visit PRN to address patient s wound care needs. FACE TO FACE ENCOUNTER: MEDICARE and MEDICAID PATIENTS: I certify that this patient is under my care and that I had a face-to-face encounter that meets the physician face-to-face encounter requirements with this patient on this date. The encounter with the patient was in whole or in part for the following MEDICAL CONDITION: (primary reason for [place]) MEDICAL NECESSITY: I certify, that based on my findings, NURSING services are a medically  necessary home health service. HOME BOUND STATUS: I certify that my clinical findings support that this patient is homebound (i.e., Due to illness or injury, pt requires aid of supportive devices such as crutches, cane, wheelchairs, walkers, the use of special transportation or the assistance of another person to leave their place of residence. There is a normal inability to leave the home and doing so requires considerable and taxing effort. Other absences are for medical reasons / religious services and are infrequent or of short duration when for other reasons). If current dressing causes regression in wound condition, may D/C ordered dressing product/s and apply Normal Saline Moist Dressing daily until next [place] / Other MD appointment. [place] of regression in wound condition at [phone]. Please direct any NON-WOUND related issues/requests for orders to patient's Primary Care Physician On his right arm I have recommended a piece of Hydoactive B Kerlix and Coban to be left intact for the week. FLAY, RIBERA ([id number]) On his left medial ankle we will use Sorbact and foam and will put a Kerlix and Coban to reduce his edema. however I'm going to change over to RTD and have ordered this for his home health to start changing 3 times a week. We are awaiting his vascular studies and if no vascular intervention is needed he may need another round of a skin substitute possibly Theraskin. application of this will be delayed until we know his vascular status and I anticipated to be early January before we take this decision. Electronic Signature(s) Signed: 03/28/2015 4:16:40 PM By: Philip Fudge MD, FACS Previous Signature: 03/28/2015 10:48:42 AM Version By: Philip Fudge MD, FACS Entered By: Philip [name] on 03/28/2015 16:16:40 Philip [name] ([id number]) -------------------------------------------------------------------------------- SuperBill  Details Patient Name: PARTHIV, MCLEMORE. Date of Service: 03/28/2015 Medical  Record Number: [id number] Patient Account Number: [id number] Date of Birth/Sex: 06-02-1918 (81 y.o. Male) Treating RN: Philip [name] Primary Care Physician: Philip [name] Other Clinician: Referring Physician: Hortencia [name] Treating Physician/Extender: Philip [name] in Treatment: 81 Diagnosis Coding ICD-10 Codes Code Description [phone] 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 I82.401 Acute embolism and thrombosis of unspecified deep veins of right lower extremity S91.301A Unspecified open wound, right foot, initial encounter Z92.21 Personal history of antineoplastic chemotherapy L97.522 Non-pressure chronic ulcer of other part of left foot with fat layer exposed S41.111A Laceration without foreign body of right upper arm, initial encounter Facility Procedures The patient participates with Medicare or their insurance follows the Medicare Facility Guidelines: CPT4 Code Description Modifier Quantity [id number] 11042 - DEB SUBQ TISSUE 20 SQ CM/< 1 ICD-10 Description Diagnosis I70.232 Atherosclerosis of native  arteries of right leg with ulceration of calf Z92.21 Personal history of antineoplastic chemotherapy L97.522 Non-pressure chronic ulcer of other part of left foot with fat layer exposed S41.111A Laceration without foreign body of right upper arm, initial  encounter Physician Procedures CPT4 Code Description: [id number] - WC PHYS SUBQ TISS 20 SQ CM ICD-10 Description Diagnosis I70.232 Atherosclerosis of native arteries of right leg with ul Z92.21 Personal history of antineoplastic chemotherapy L97.522 Non-pressure chronic ulcer of  other part of left foot w S41.111A Laceration without foreign body of right upper arm, ini Modifier: ceration of ith fat laye tial encount Quantity: 1 calf r exposed er Electronic Signature(s) Signed: 03/28/2015 10:48:59 AM By: Philip Fudge MD,  FACS Entered By: Philip [name] on 03/28/2015 10:48:58 Philip [name] ([id number])

## 2015-04-04 ENCOUNTER — Encounter: Payer: Medicare Other | Admitting: Surgery

## 2015-04-04 DIAGNOSIS — I70232 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: Secondary | ICD-10-CM | POA: Diagnosis not present

## 2015-04-05 NOTE — Progress Notes (Signed)
Philip [name] ([id number]) Visit Report for 04/04/2015 Chief Complaint Document Details Patient Name: Philip [name], Philip [name]. Date of Service: 04/04/2015 9:30 AM Medical Record Number: [id number] Patient Account Number: [id number] Date of Birth/Sex: Aug 25, 1918 (81 y.o. Male) Treating RN: Montey Hora Primary Care Physician: Hortencia Pilar Other Clinician: Referring Physician: Hortencia Pilar Treating Physician/Extender: Frann Rider in Treatment: 69 Information Obtained from: Patient Chief Complaint R foot ulcer. L forearm ulcer. 07/19/2014 -- about 2 weeks ago he had a surgical procedure done by dermatologist in [place] and has an open surgical wound on the dorsum of the right foot. Electronic Signature(s) Signed: 04/04/2015 10:14:36 AM By: Christin Fudge MD, FACS Entered By: Christin Fudge on 04/04/2015 10:14:36 [name] ([id number]) -------------------------------------------------------------------------------- HPI Details Patient Name: Philip [name]. Date of Service: 04/04/2015 9:30 AM Medical Record Number: [id number] Patient Account Number: [id number] Date of Birth/Sex: 1918-12-07 (81 y.o. Male) Treating RN: Montey Hora Primary Care Physician: Hortencia Pilar Other Clinician: Referring Physician: Hortencia Pilar Treating Physician/Extender: Frann Rider in Treatment: 41 History of Present Illness Location: right leg Duration: Dec 2015 Modifying Factors: history of an injury to the right leg with resulting hematoma and thrombophlebitis and later an ulcer of posterior leg Associated Signs and Symptoms: marked lymphedema of the right leg. He is already on Eloquis. HPI Description: 06/14/14 -- He returns for followup today. He denies any fevers. no fresh issues and his daughter says he's been doing fine. 06/21/14 -- after he sustained a fall this week earlier he applied a bandage over this himself and did not seek any medical attention. he did however manage  to control the bleeding and had a dressing in place the next morning when his son to the visit. In this dressing was removed there was further damaged skin. His right leg has been doing fine otherwise. 07/12/14 --Very pleasant 81 year old with past medical history significant for congestive heart failure (EF 15%), peripheral vascular disease, and chronic kidney disease. He was hospitalized at [name] in December 2015 for congestive heart failure. He says that he fell during his hospital course and developed a hematoma over his right calf. He was also diagnosed with a right lower extremity DVT for which he takes Eliquis. The hematoma subsequently turned into an ulceration around Christmas, which has healed. He subsequently developed an ulcer on his right dorsal foot and a traumatic left forearm ulcer. Per his report, he underwent biopsy of the right dorsal foot ulceration which demonstrated a skin cancer. His PCP and dermatologist office are both closed today. I reviewed his records in [place] but find no report of biopsy or pathology. He s without complaints today. No significant pain. No fever or chills. Minimal drainage. 07/19/2014 - the patient and his son tell me that about 2 weeks ago the dermatologist did a skin biopsy and this was a large area on the dorsum of his right foot which was left open and no dressing instructions were recommended. Since then he has been called and told that it is a cancer and the need to do a further procedure but that will not happen until about 2 weeks from now. In the meanwhile the patient has not been taking care of his right foot. The left forearm where he had an abrasion and laceration is doing very well. 07/26/2014 -- Reports from 07/08/2014 from the dermatology group reviewed. A excision was done of a lesion located on the dorsum of the right foot and this was 1.7 cm in diameter which was a shave  biopsy performed. The wound was left open after appropriate  cauterization and the patient was given this dressing instructions. 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 08/02/2014 -- all the original problems he came with have completely resolved. He now has a surgical wound on his right foot dorsum where a skin cancer was excised. He goes to see his dermatologist this Philip [name] ([id number]) coming Tuesday and will have definite news next Friday. 08/09/2014 he had gone to his dermatologist on Tuesday and she has injected the base of his ulcer with some chemotherapeutic agent. He was supposed to bring some papers with him but forgot to get them and will bring them in next week. Other than that the dermatologist had suggested using Mehdi honey on the wound. 08/16/2014 -- the patient has brought in his notes from the dermatologist and on 08/06/2014 he received a injection of 5 FU, 500 mg grams into the lesion. the pathology report was also sent and it was a squamous cell carcinoma well-differentiated and edges were involved. They wanted him to use many honey for the wound dressing changes to be done 3 times a week. 08/30/2014 -- he has finished his second injection of 5-FU and has the next one in 2 weeks' time. He is doing fine otherwise. 09/13/2014 - No new complaints. No significant pain. No fever or chills. Minimal drainage. Still receiving 5-FU injections. 09/20/2014 -- He was seen by the dermatologist on 09/10/2014 and Dr. Phillip Heal injected his foot both the right on the dorsum and left near the medial malleolus with 5-FU. The next dose of 5-FU is to be given after 3 months. The patient says he now has a spot on the left medial malleolus where he was injected with 5-FU. 09/27/2014 -- the area on the left ankle where he was injected with 5-FU is now a full-blown ulcer. He also has mild pain in both ankle areas. 10/04/2014 - large had a bit of a fall and  injured his right arm last evening and has had a laceration with no evidence of any foreign body in the right forearm. 12/20/2014 -- he recently saw his dermatologist at [facility] and she was pleased with his wound healing on the right lower extremity. She has not given him any further 5-FU injections and will see him back on a when necessary basis. 01/03/2015 -- his skin substitute Grafix has been approved by his insurance and we will go ahead with this next week. 01/10/2015 -- he has his first application of Grafix today. he recently had a nightmare and injured his right forearm and had some abrasions. 01/13/2015 -- Iona Beard has developed significant swelling of the left calf with some tenderness of the calf. This was only noticed this morning. Addendum: The DVT study done in the hospital -- IMPRESSION:No evidence of deep venous thrombosis left lower extremity. The result was called into the patient's son who acknowledges the report and will bring the patient back on Friday 01/17/2015 -- he saw his PCP Dr. Hoy Morn and she has increases dosage of Lasix and his edema on the left lower extremity is looking much better. He is here for a second applications of Grafix [id number] -- he is overall doing very well his edema has come down significantly. He is here for his third application of grafix. 02/04/2015 -- he has no new issues and is here for his fourth application of grafix [id number] -- he is here  for a wound review today and has no fresh issues. 02/21/2015 -- he has no new issues and is here for his fifth and last application of grafix. 03/21/2015 -- he has had one of the vascular test done last week and is awaiting the second one. KUSHAGRA, BIGHAM ([id number]) than that his health has not changed in any way. 03/28/2015 -- we tried hard but no vascular reports are available yet and his next test is on December 29. We've had him authorized for Theraskin, but I'm awaiting his vascular  workup and I believe it won't be too early January when we will take a decision regarding this. Electronic Signature(s) Signed: 04/04/2015 10:14:46 AM By: Christin Fudge MD, FACS Entered By: Christin Fudge on 04/04/2015 10:14:46 [name] ([id number]) -------------------------------------------------------------------------------- Physical Exam Details Patient Name: DANARIUS, MERRYWEATHER. Date of Service: 04/04/2015 9:30 AM Medical Record Number: [id number] Patient Account Number: [id number] Date of Birth/Sex: [birth year] (81 y.o. Male) Treating RN: Montey Hora Primary Care Physician: Hortencia Pilar Other Clinician: Referring Physician: Hortencia Pilar Treating Physician/Extender: Frann Rider in Treatment: 48 Constitutional . Pulse regular. Respirations normal and unlabored. Afebrile. . Eyes Nonicteric. Reactive to light. Ears, Nose, Mouth, and Throat Lips, teeth, and gums WNL.Marland Kitchen Moist mucosa without lesions . Neck supple and nontender. No palpable supraclavicular or cervical adenopathy. Normal sized without goiter. Respiratory WNL. No retractions.. Cardiovascular Pedal Pulses WNL. No clubbing, cyanosis or edema. Lymphatic No adneopathy. No adenopathy. No adenopathy. Musculoskeletal Adexa without tenderness or enlargement.. Digits and nails w/o clubbing, cyanosis, infection, petechiae, ischemia, or inflammatory conditions.. Integumentary (Hair, Skin) No suspicious lesions. No crepitus or fluctuance. No peri-wound warmth or erythema. No masses.Marland Kitchen Psychiatric Judgement and insight Intact.. No evidence of depression, anxiety, or agitation.. Notes the right arm is looking excellent and there is no maceration and almost completely healed. The left medial ankle has healthy granulation tissue today and there is some fascia exposed but there is no cellulitis surrounding it. The edema on his left lower extremity has improved markedly. Electronic Signature(s) Signed: 04/04/2015  10:15:34 AM By: Christin Fudge MD, FACS Entered By: Christin Fudge on 04/04/2015 10:15:34 [name] ([id number]) -------------------------------------------------------------------------------- Physician Orders Details Patient Name: EMILEO, SCHADLER. Date of Service: 04/04/2015 9:30 AM Medical Record Number: [id number] Patient Account Number: [id number] Date of Birth/Sex: [birth year] (81 y.o. Male) Treating RN: Montey Hora Primary Care Physician: Hortencia Pilar Other Clinician: Referring Physician: Hortencia Pilar Treating Physician/Extender: Frann Rider in Treatment: 47 Verbal / Phone Orders: Yes Clinician: Montey Hora Read Back and Verified: Yes Diagnosis Coding Wound Cleansing Wound #4 Left,Medial Malleolus o Cleanse wound with mild soap and water Wound #5R Right,Lateral Forearm o Cleanse wound with mild soap and water Anesthetic Wound #4 Left,Medial Malleolus o Topical Lidocaine 4% cream applied to wound bed prior to debridement Wound #5R Right,Lateral Forearm o Topical Lidocaine 4% cream applied to wound bed prior to debridement Primary Wound Dressing Wound #4 Left,Medial Malleolus o Other: - RTD - HHRN to order RTD - may use hydrofera blue until RTD is available Wound #5R Right,Lateral Forearm o Other: - Hydroactive B Secondary Dressing Wound #4 Left,Medial Malleolus o Boardered Foam Dressing Wound #5R Right,Lateral Forearm o Gauze, ABD and Kerlix/Conform Dressing Change Frequency Wound #4 Left,Medial Malleolus o Three times weekly Wound #5R Right,Lateral Forearm o Change dressing every week Follow-up Appointments CLEOFAS, SCHLOSS. ([id number]) Wound #4 Left,Medial Malleolus o Return Appointment in 1 week. Wound #5R Right,Lateral Forearm o Return Appointment in 1 week. Edema Control o 2 Layer  Compression System - Left Lower Extremity - kerlix, coban Home Health Wound #4 [place]  Visits - [place] to order RTD - may use hydrofera blue until RTD is available o Home Health Nurse may visit PRN to address patientos wound care needs. o FACE TO FACE ENCOUNTER: MEDICARE and MEDICAID PATIENTS: I certify that this patient is under my care and that I had a face-to-face encounter that meets the physician face-to-face encounter requirements with this patient on this date. The encounter with the patient was in whole or in part for the following MEDICAL CONDITION: (primary reason for [place]) MEDICAL NECESSITY: I certify, that based on my findings, NURSING services are a medically necessary home health service. HOME BOUND STATUS: I certify that my clinical findings support that this patient is homebound (i.e., Due to illness or injury, pt requires aid of supportive devices such as crutches, cane, wheelchairs, walkers, the use of special transportation or the assistance of another person to leave their place of residence. There is a normal inability to leave the home and doing so requires considerable and taxing effort. Other absences are for medical reasons / religious services and are infrequent or of short duration when for other reasons). o If current dressing causes regression in wound condition, may D/C ordered dressing product/s and apply Normal Saline Moist Dressing daily until next [place] / Other MD appointment. [place] of regression in wound condition at [phone]. o Please direct any NON-WOUND related issues/requests for orders to patient's Primary Care Physician Wound #5R Right,Lateral Forearm o [place] Visits - [place] Nurse may visit PRN to address patientos wound care needs. o FACE TO FACE ENCOUNTER: MEDICARE and MEDICAID PATIENTS: I certify that this patient is under my care and that I had a face-to-face encounter that meets the physician face-to-face encounter requirements  with this patient on this date. The encounter with the patient was in whole or in part for the following MEDICAL CONDITION: (primary reason for [place]) MEDICAL NECESSITY: I certify, that based on my findings, NURSING services are a medically necessary home health service. HOME BOUND STATUS: I certify that my clinical findings support that this patient is homebound (i.e., Due to illness or injury, pt requires aid of supportive devices such as crutches, cane, wheelchairs, walkers, the use of special transportation or the assistance of another person to leave their place of residence. There is a normal inability to leave the home and doing so requires considerable and taxing effort. Other absences are for medical reasons / religious services and are infrequent or of short duration when for other reasons). o If current dressing causes regression in wound condition, may D/C ordered dressing product/s and apply Normal Saline Moist Dressing daily until next [place] / Other MD appointment. [place] of regression in wound condition at [phone]. CARNEL, TORNES ([id number]) o Please direct any NON-WOUND related issues/requests for orders to patient's Primary Care Physician Electronic Signature(s) Signed: 04/04/2015 4:28:44 PM By: Christin Fudge MD, FACS Signed: 04/04/2015 5:02:54 PM By: Montey Hora Entered By: Montey Hora on 04/04/2015 10:08:59 [name] ([id number]) -------------------------------------------------------------------------------- Problem List Details Patient Name: CHANO, RINNE. Date of Service: 04/04/2015 9:30 AM Medical Record Number: [id number] Patient Account Number: [id number] Date of Birth/Sex: [birth year] (81 y.o. Male) Treating RN: Montey Hora Primary Care Physician: Hortencia Pilar Other Clinician: Referring Physician: Hortencia Pilar Treating Physician/Extender: Frann Rider in Treatment: 21 Active  Problems ICD-10 Encounter Code Description Active Date Diagnosis I70.232 Atherosclerosis of native arteries of right leg with 06/21/2014 Yes ulceration of calf I82.401 Acute embolism and thrombosis of unspecified deep veins 06/21/2014 Yes of right lower extremity S91.301A Unspecified open wound, right foot, initial encounter 07/19/2014 Yes Z92.21 Personal history of antineoplastic chemotherapy 08/23/2014 Yes L97.522 Non-pressure chronic ulcer of other part of left foot with fat 09/20/2014 Yes layer exposed S41.111A Laceration without foreign body of right upper arm, initial 10/04/2014 Yes encounter Inactive Problems Resolved Problems ICD-10 Code Description Active Date Resolved Date L97.212 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 Date of Service: 04/04/2015 9:30 AM Medical Record Number: [id number] Patient Account Number: [id number] Date of Birth/Sex: Dec 01, 1918 (81 y.o. Male) Treating RN: Montey Hora Primary Care Physician: Hortencia Pilar Other Clinician: Referring Physician: Hortencia Pilar Treating Physician/Extender: Frann Rider in Treatment: 66 Subjective Chief Complaint Information obtained from Patient R foot ulcer. L forearm ulcer. 07/19/2014 -- about 2 weeks ago he had a surgical procedure done by dermatologist in [place] and has an open surgical wound on the dorsum of the right foot. History of Present Illness (HPI) The following HPI elements were documented for the patient's wound: Location: right leg Duration:  Dec 2015 Modifying Factors: history of an injury to the right leg with resulting hematoma and thrombophlebitis and later an ulcer of posterior leg Associated Signs and Symptoms: marked lymphedema of the right leg. He is already on Eloquis. 06/14/14 -- He returns for followup today. He denies any fevers. no fresh issues and his daughter says he's been doing fine. 06/21/14 -- after he sustained a fall this week earlier he applied a bandage over this himself and did not seek any medical attention. he did however manage to control the bleeding and had a dressing in place the next morning when his son to the visit. In this dressing was removed there was further damaged skin. His right leg has been doing fine otherwise. 07/12/14 --Very pleasant 81 year old with past medical history significant for congestive heart failure (EF 15%), peripheral vascular disease, and chronic kidney disease. He was hospitalized at [facility] in December 2015 for congestive heart failure. He says that he fell during his hospital course and developed a hematoma over his right calf. He was also diagnosed with a right lower extremity DVT for which he takes Eliquis. The hematoma subsequently turned into an ulceration around Christmas, which has healed. He subsequently developed an ulcer on his right dorsal foot and a traumatic left forearm ulcer. Per his report, he underwent biopsy of the right dorsal foot ulceration which demonstrated a skin cancer. His PCP and dermatologist office are both closed today. I reviewed his records in [place] but find no report of biopsy or pathology. He s without complaints today. No significant pain. No fever or chills. Minimal drainage. 07/19/2014 - the patient and his son tell me that about 2 weeks ago the dermatologist did a skin biopsy and this was a large area on the dorsum of his right foot which was left open and no dressing instructions were recommended. Since then he has been called and told that  it is a cancer and the need to do a further procedure  but that will not happen until about 2 weeks from now. In the meanwhile the patient has not been taking care of his right foot. The left forearm where he had an abrasion and laceration is doing very well. KAELIN, DEMAN ([id number]) 07/26/2014 -- Reports from 07/08/2014 from the dermatology group reviewed. A excision was done of a lesion located on the dorsum of the right foot and this was 1.7 cm in diameter which was a shave biopsy performed. The wound was left open after appropriate cauterization and the patient was given this dressing instructions. 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 08/02/2014 -- all the original problems he came with have completely resolved. He now has a surgical wound on his right foot dorsum where a skin cancer was excised. He goes to see his dermatologist this coming Tuesday and will have definite news next Friday. 08/09/2014 he had gone to his dermatologist on Tuesday and she has injected the base of his ulcer with some chemotherapeutic agent. He was supposed to bring some papers with him but forgot to get them and will bring them in next week. Other than that the dermatologist had suggested using Mehdi honey on the wound. 08/16/2014 -- the patient has brought in his notes from the dermatologist and on 08/06/2014 he received a injection of 5 FU, 500 mg grams into the lesion. the pathology report was also sent and it was a squamous cell carcinoma well-differentiated and edges were involved. They wanted him to use many honey for the wound dressing changes to be done 3 times a week. 08/30/2014 -- he has finished his second injection of 5-FU and has the next one in 2 weeks' time. He is doing fine otherwise. 09/13/2014 - No new complaints. No significant pain. No fever or chills. Minimal drainage. Still receiving  5-FU injections. 09/20/2014 -- He was seen by the dermatologist on 09/10/2014 and Dr. Phillip Heal injected his foot both the right on the dorsum and left near the medial malleolus with 5-FU. The next dose of 5-FU is to be given after 3 months. The patient says he now has a spot on the left medial malleolus where he was injected with 5-FU. 09/27/2014 -- the area on the left ankle where he was injected with 5-FU is now a full-blown ulcer. He also has mild pain in both ankle areas. 10/04/2014 - large had a bit of a fall and injured his right arm last evening and has had a laceration with no evidence of any foreign body in the right forearm. 12/20/2014 -- he recently saw his dermatologist at [facility] and she was pleased with his wound healing on the right lower extremity. She has not given him any further 5-FU injections and will see him back on a when necessary basis. 01/03/2015 -- his skin substitute Grafix has been approved by his insurance and we will go ahead with this next week. 01/10/2015 -- he has his first application of Grafix today. he recently had a nightmare and injured his right forearm and had some abrasions. 01/13/2015 -- Iona Beard has developed significant swelling of the left calf with some tenderness of the calf. This was only noticed this morning. Addendum: The DVT study done in the hospital -- IMPRESSION:No evidence of deep venous thrombosis left lower extremity. The result was called into the patient's son who acknowledges the report and will bring the patient back on Friday OLAOLUWA, FERNS ([id number]) 01/17/2015 --  he saw his PCP Dr. Hoy Morn and she has increases dosage of Lasix and his edema on the left lower extremity is looking much better. He is here for a second applications of Grafix [id number] -- he is overall doing very well his edema has come down significantly. He is here for his third application of grafix. 02/04/2015 -- he has no new issues and is here for his fourth  application of grafix [id number] -- he is here for a wound review today and has no fresh issues. 02/21/2015 -- he has no new issues and is here for his fifth and last application of grafix. 03/21/2015 -- he has had one of the vascular test done last week and is awaiting the second one. Other than that his health has not changed in any way. 03/28/2015 -- we tried hard but no vascular reports are available yet and his next test is on December 29. We've had him authorized for Theraskin, but I'm awaiting his vascular workup and I believe it won't be too early January when we will take a decision regarding this. Objective Constitutional Pulse regular. Respirations normal and unlabored. Afebrile. Vitals Time Taken: 9:39 AM, Height: 69 in, Weight: 179 lbs, BMI: 26.4, Temperature: 97.9 F, Pulse: 69 bpm, Respiratory Rate: 18 breaths/min, Blood Pressure: 128/47 mmHg. Eyes Nonicteric. Reactive to light. Ears, Nose, Mouth, and Throat Lips, teeth, and gums WNL.Marland Kitchen Moist mucosa without lesions . Neck supple and nontender. No palpable supraclavicular or cervical adenopathy. Normal sized without goiter. Respiratory WNL. No retractions.. Cardiovascular Pedal Pulses WNL. No clubbing, cyanosis or edema. Lymphatic No adneopathy. No adenopathy. No adenopathy. Musculoskeletal Adexa without tenderness or enlargement.. Digits and nails w/o clubbing, cyanosis, infection, petechiae, ischemia, or inflammatory conditions.CASIMIER, DUMITRESCU ([id number]) Psychiatric Judgement and insight Intact.. No evidence of depression, anxiety, or agitation.. General Notes: the right arm is looking excellent and there is no maceration and almost completely healed. The left medial ankle has healthy granulation tissue today and there is some fascia exposed but there is no cellulitis surrounding it. The edema on his left lower extremity has improved markedly. Integumentary (Hair, Skin) No suspicious lesions. No crepitus or  fluctuance. No peri-wound warmth or erythema. No masses.. Wound #4 status is Open. Original cause of wound was Other Lesion. The wound is located on the Left,Medial Malleolus. The wound measures 3.3cm length x 2.4cm width x 0.3cm depth; 6.22cm^2 area and 1.866cm^3 volume. The wound is limited to skin breakdown. There is no tunneling or undermining noted. There is a large amount of serous drainage noted. The wound margin is distinct with the outline attached to the wound base. There is small (1-33%) pink granulation within the wound bed. There is a large (67-100%) amount of necrotic tissue within the wound bed including Adherent Slough. The periwound skin appearance exhibited: Localized Edema, Maceration, Moist, Hemosiderin Staining, Erythema. The surrounding wound skin color is noted with erythema which is circumferential. Periwound temperature was noted as No Abnormality. The periwound has tenderness on palpation. Wound #5R status is Open. Original cause of wound was Shear/Friction. The wound is located on the Right,Lateral Forearm. The wound measures 6cm length x 2.3cm width x 0.1cm depth; 10.838cm^2 area and 1.084cm^3 volume. The wound is limited to skin breakdown. There is no tunneling or undermining noted. There is a large amount of purulent drainage noted. The wound margin is flat and intact. There is large (67-100%) red, pink granulation within the wound bed. There is no necrotic tissue within the wound bed. The periwound skin  appearance exhibited: Localized Edema, Moist. The periwound skin appearance did not exhibit: Callus, Crepitus, Excoriation, Fluctuance, Friable, Induration, Rash, Scarring, Dry/Scaly, Maceration, Atrophie Blanche, Cyanosis, Ecchymosis, Hemosiderin Staining, Mottled, Pallor, Rubor, Erythema. Periwound temperature was noted as No Abnormality. 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 S41.111A - Laceration without foreign body of right upper arm, initial encounter QUASIR, CAPEHART ([id number]) Plan Wound Cleansing: Wound #4 Left,Medial Malleolus: Cleanse wound with mild soap and water Wound #5R Right,Lateral Forearm: Cleanse wound with mild soap and water Anesthetic: Wound #4 Left,Medial Malleolus: Topical Lidocaine 4% cream applied to wound bed prior to debridement Wound #5R Right,Lateral Forearm: Topical Lidocaine 4% cream applied to wound bed prior to debridement Primary Wound Dressing: Wound #4 Left,Medial Malleolus: Other: - RTD - HHRN to order RTD - may use hydrofera blue until RTD is available Wound #5R Right,Lateral Forearm: Other: - Hydroactive B Secondary Dressing: Wound #4 Left,Medial Malleolus: Boardered Foam Dressing Wound #5R Right,Lateral Forearm: Gauze, ABD and Kerlix/Conform Dressing Change Frequency: Wound #4 Left,Medial Malleolus: Three times weekly Wound #5R Right,Lateral Forearm: Change dressing every week Follow-up Appointments: Wound #4 Left,Medial Malleolus: Return Appointment in 1 week. Wound #5R Right,Lateral Forearm: Return Appointment in 1 week. Edema Control: 2 Layer Compression System - Left Lower Extremity - kerlix, coban Home Health: Wound #4 Left,Medial Malleolus: [place] Visits - [place] to order RTD - may use hydrofera blue until RTD is available Home Health Nurse may visit PRN to address patient s wound care needs. FACE TO FACE ENCOUNTER: MEDICARE and MEDICAID PATIENTS: I certify that this patient is under my care and that I had a face-to-face encounter that meets the physician face-to-face  encounter requirements with this patient on this date. The encounter with the patient was in whole or in part for the following MEDICAL CONDITION: (primary reason for [place]) MEDICAL NECESSITY: I certify, that based on my findings, NURSING services are a medically necessary home health service. HOME BOUND STATUS: I certify that my clinical findings support that this patient is homebound (i.e., Due to illness or injury, pt requires aid of supportive devices such as crutches, cane, wheelchairs, walkers, the use of special transportation or the assistance of another person to leave their place of residence. There is a normal inability to leave the home and doing so requires considerable and taxing effort. Other absences are for medical reasons / religious services and are infrequent or of short duration when for other reasons). If current dressing causes regression in wound condition, may D/C ordered dressing product/s and apply IDAN, NAZARI ([id number]) Normal Saline Moist Dressing daily until next [place] / Other MD appointment. [place] of regression in wound condition at [phone]. Please direct any NON-WOUND related issues/requests for orders to patient's Primary Care Physician Wound #5R Right,Lateral Forearm: [place] Nurse may visit PRN to address patient s wound care needs. FACE TO FACE ENCOUNTER: MEDICARE and MEDICAID PATIENTS: I certify that this patient is under my care and that I had a face-to-face encounter that meets the physician face-to-face encounter requirements with this patient on this date. The encounter with the patient was  in whole or in part for the following MEDICAL CONDITION: (primary reason for Home Healthcare) MEDICAL NECESSITY: I certify, that based on my findings, NURSING services are a medically necessary home health service. HOME BOUND STATUS: I certify that my clinical findings  support that this patient is homebound (i.e., Due to illness or injury, pt requires aid of supportive devices such as crutches, cane, wheelchairs, walkers, the use of special transportation or the assistance of another person to leave their place of residence. There is a normal inability to leave the home and doing so requires considerable and taxing effort. Other absences are for medical reasons / religious services and are infrequent or of short duration when for other reasons). If current dressing causes regression in wound condition, may D/C ordered dressing product/s and apply Normal Saline Moist Dressing daily until next [place] / Other MD appointment. [place] of regression in wound condition at [phone]. Please direct any NON-WOUND related issues/requests for orders to patient's Primary Care Physician On his right arm I have recommended a piece of Hydoactive B, Kerlix and Coban to be left intact for the week. On his left medial ankle we will use Hydrofera Blue, Kerlix and Coban to reduce his edema. however I'm going to change over to RTD and have ordered this for his home health to start changing 3 times a week. We are awaiting his vascular studies and if no vascular intervention is needed he may need another round of a skin substitute possibly Ther[place]in. application of this will be delayed until we know his vascular status and I anticipated to be early January before we take this decision. Electronic Signature(s) Signed: 04/04/2015 10:16:17 AM By: Christin Fudge MD, FACS Entered By: Christin Fudge on 04/04/2015 10:16:17 [name] ([id number]) -------------------------------------------------------------------------------- [place] Details Patient Name: KNOWLEDGE, BAQUERO. Date of Service: 04/04/2015 Medical Record Number: [id number] Patient Account Number: [id number] Date of Birth/Sex: [birth year] (81 y.o. 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 S41.111A Laceration without foreign body of right upper arm, initial encounter Facility Procedures The patient participates with Medicare or their insurance follows the Medicare Facility Guidelines: CPT4 Code Description Modifier Quantity [id number] [place] VISIT-LEV 4 EST PT 1 Physician Procedures CPT4 Code Description: [id number] - WC PHYS LEVEL 3 - EST PT ICD-10 Description Diagnosis I70.232 Atherosclerosis of native arteries of right leg with ul L97.522 Non-pressure chronic ulcer of other part of left foot w Z92.21 Personal history of  antineoplastic chemotherapy S41.111A Laceration without foreign body of right upper arm, ini Modifier: ceration of ith fat laye tial encount Quantity: 1 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

## 2015-04-05 NOTE — Progress Notes (Signed)
VOLVI, KAPSNER ([id number]) Visit Report for 04/04/2015 Arrival Information Details Patient Name: Philip [name], Philip [name]. Date of Service: 04/04/2015 9:30 AM Medical Record Number: [id number] Patient Account Number: [id number] Date of Birth/Sex: [birth year] (81 y.o. Male) Treating RN: Montey Hora Primary Care Physician: Hortencia Pilar Other Clinician: Referring Physician: Hortencia Pilar Treating Physician/Extender: Frann Rider in Treatment: 64 Visit Information History Since Last Visit Added or deleted any medications: No Patient Arrived: Walker Any new allergies or adverse reactions: No Arrival Time: 09:38 Had a fall or experienced change in No Accompanied By: son activities of daily living that may affect Transfer Assistance: None risk of falls: Patient Identification Verified: Yes Signs or symptoms of abuse/neglect since last No Secondary Verification Process Yes visito Completed: Hospitalized since last visit: No Patient Requires Transmission- No Pain Present Now: Yes Based Precautions: Patient Has Alerts: Yes Patient Alerts: Patient on Blood Thinner No ABIs dt LE blood clots Electronic Signature(s) Signed: 04/04/2015 5:02:54 PM By: Montey Hora Entered By: Montey Hora on 04/04/2015 09:39:21 Philip [name] ([id number]) -------------------------------------------------------------------------------- Clinic Level of Care Assessment Details Patient Name: Philip [name]. Date of Service: 04/04/2015 9:30 AM Medical Record Number: [id number] Patient Account Number: [id number] Date of Birth/Sex: 04-16-1919 (81 y.o. Male) Treating RN: Montey Hora Primary Care Physician: Hortencia Pilar Other Clinician: Referring Physician: Hortencia Pilar Treating Physician/Extender: Frann Rider in Treatment: 43 Clinic Level of Care Assessment Items TOOL 4 Quantity Score []  - Use when only an EandM is performed on FOLLOW-UP visit 0 ASSESSMENTS - Nursing Assessment /  Reassessment X - Reassessment of Co-morbidities (includes updates in patient status) 1 10 X - Reassessment of Adherence to Treatment Plan 1 5 ASSESSMENTS - Wound and Skin Assessment / Reassessment []  - Simple Wound Assessment / Reassessment - one wound 0 X - Complex Wound Assessment / Reassessment - multiple wounds 2 5 []  - Dermatologic / Skin Assessment (not related to wound area) 0 ASSESSMENTS - Focused Assessment X - Circumferential Edema Measurements - multi extremities 1 5 []  - Nutritional Assessment / Counseling / Intervention 0 X - Lower Extremity Assessment (monofilament, tuning fork, pulses) 1 5 []  - Peripheral Arterial Disease Assessment (using hand held doppler) 0 ASSESSMENTS - Ostomy and/or Continence Assessment and Care []  - Incontinence Assessment and Management 0 []  - Ostomy Care Assessment and Management (repouching, etc.) 0 PROCESS - Coordination of Care X - Simple Patient / Family Education for ongoing care 1 15 []  - Complex (extensive) Patient / Family Education for ongoing care 0 []  - Staff obtains Programmer, systems, Records, Test Results / Process Orders 0 []  - Staff telephones HHA, Nursing Homes / Clarify orders / etc 0 []  - Routine Transfer to another Facility (non-emergent condition) 0 Philip [name], Philip [name] ([id number]) []  - Routine Hospital Admission (non-emergent condition) 0 []  - New Admissions / Insurance Authorizations / Ordering NPWT, Apligraf, etc. 0 []  - Emergency Hospital Admission (emergent condition) 0 X - Simple Discharge Coordination 1 10 []  - Complex (extensive) Discharge Coordination 0 PROCESS - Special Needs []  - Pediatric / Minor Patient Management 0 []  - Isolation Patient Management 0 []  - Hearing / Language / Visual special needs 0 []  - Assessment of Community assistance (transportation, D/C planning, etc.) 0 []  - Additional assistance / Altered mentation 0 []  - Support Surface(s) Assessment (bed, cushion, seat, etc.) 0 INTERVENTIONS - Wound Cleansing /  Measurement []  - Simple Wound Cleansing - one wound 0 X - Complex Wound Cleansing - multiple wounds 2 5 X - Wound Imaging (photographs - any  number of wounds) 1 5 []  - Wound Tracing (instead of photographs) 0 []  - Simple Wound Measurement - one wound 0 X - Complex Wound Measurement - multiple wounds 2 5 INTERVENTIONS - Wound Dressings []  - Small Wound Dressing one or multiple wounds 0 []  - Medium Wound Dressing one or multiple wounds 0 X - Large Wound Dressing one or multiple wounds 2 20 []  - Application of Medications - topical 0 []  - Application of Medications - injection 0 INTERVENTIONS - Miscellaneous []  - External ear exam 0 Philip [name], Philip [name] ([id number]) []  - Specimen Collection (cultures, biopsies, blood, body fluids, etc.) 0 []  - Specimen(s) / Culture(s) sent or taken to Lab for analysis 0 []  - Patient Transfer (multiple staff / Harrel Lemon Lift / Similar devices) 0 []  - Simple Staple / Suture removal (25 or less) 0 []  - Complex Staple / Suture removal (26 or more) 0 []  - Hypo / Hyperglycemic Management (close monitor of Blood Glucose) 0 []  - Ankle / Brachial Index (ABI) - do not check if billed separately 0 X - Vital Signs 1 5 Has the patient been seen at the hospital within the last three years: Yes Total Score: 130 Level Of Care: New/Established - Level 4 Electronic Signature(s) Signed: 04/04/2015 5:02:54 PM By: Montey Hora Entered By: Montey Hora on 04/04/2015 10:09:43 Philip [name] ([id number]) -------------------------------------------------------------------------------- Encounter Discharge Information Details Patient Name: Philip [name], Philip [name]. Date of Service: 04/04/2015 9:30 AM Medical Record Number: [id number] Patient Account Number: [id number] Date of Birth/Sex: 19-Jan-1919 (81 y.o. Male) Treating RN: Montey Hora Primary Care Physician: Hortencia Pilar Other Clinician: Referring Physician: Hortencia Pilar Treating Physician/Extender: Frann Rider in  Treatment: 3 Encounter Discharge Information Items Discharge Pain Level: 0 Discharge Condition: Stable Ambulatory Status: Walker Discharge Destination: Home Transportation: Private Auto Accompanied By: son Schedule Follow-up Appointment: Yes Medication Reconciliation completed and provided to Patient/Care No Lamonta [place]: Provided on Clinical Summary of Care: 04/04/2015 Form Type Recipient Paper Patient GT Electronic Signature(s) Signed: 04/04/2015 10:25:47 AM By: Ruthine Dose Entered By: Ruthine Dose on 04/04/2015 10:25:47 Philip [name] ([id number]) -------------------------------------------------------------------------------- Lower Extremity Assessment Details Patient Name: Philip [name], Philip [name]. Date of Service: 04/04/2015 9:30 AM Medical Record Number: [id number] Patient Account Number: [id number] Date of Birth/Sex: 06-11-1918 (81 y.o. Male) Treating RN: Montey Hora Primary Care Physician: Hortencia Pilar Other Clinician: Referring Physician: Hortencia Pilar Treating Physician/Extender: Frann Rider in Treatment: 48 Edema Assessment Assessed: [Left: No] [Right: No] Edema: [Left: Ye] [Right: s] Calf Left: Right: Point of Measurement: 38 cm From Medial Instep 33.5 cm cm Ankle Left: Right: Point of Measurement: 9 cm From Medial Instep 23.8 cm cm Vascular Assessment Pulses: Posterior Tibial Dorsalis Pedis Palpable: [Left:No] Doppler: [Left:Multiphasic] Extremity colors, hair growth, and conditions: Extremity Color: [Left:Red] Hair Growth on Extremity: [Left:No] Temperature of Extremity: [Left:Warm] Capillary Refill: [Left:< 3 seconds] Toe Nail Assessment Left: Right: Thick: Yes Discolored: Yes Deformed: No Improper Length and Hygiene: No Electronic Signature(s) Signed: 04/04/2015 5:02:54 PM By: Montey Hora Entered By: Montey Hora on 04/04/2015 09:57:41 Philip [name] ([id number], MARINELARENA  ([id number]) -------------------------------------------------------------------------------- Multi Wound Chart Details Patient Name: Philip [name], Philip [name]. Date of Service: 04/04/2015 9:30 AM Medical Record Number: [id number] Patient Account Number: [id number] Date of Birth/Sex: [birth year] (81 y.o. 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] [5R:Skin Tear] [N/A:N/A] Comorbid History: [4:Cataracts, Arrhythmia, Congestive Heart Failure] [5R:Cataracts, Arrhythmia, Congestive Heart Failure] [N/A:N/A] Date Acquired: [4:09/20/2014] [5R:09/22/2014] [N/A:N/A] Weeks of Treatment: [4:28] [5R:26] [N/A:N/A] Wound Status: [4:Open] [5R:Open] [N/A:N/A] Wound Recurrence: [4:No] [5R:Yes] [N/A:N/A] Measurements L x W x D 3.3x2.4x0.3 [5R:6x2.3x0.1] [N/A:N/A] (cm) Area (cm) : [4:6.22] [5R:10.838] [N/A:N/A] Volume (cm) : [4:1.866] [5R:1.084] [N/A:N/A] % Reduction in Area: [4:-3861.80%] [5R:-72.50%] [N/A:N/A] % Reduction in Volume: -11562.50% [5R:-72.60%] [N/A:N/A] Classification: [4:Full Thickness With Exposed Support Structures] [5R:Partial Thickness] [N/A:N/A] Exudate Amount: [4:Large] [5R:Large] [N/A:N/A] Exudate Type: [4:Serous] [5R:Purulent] [N/A:N/A] Exudate Color: [4:amber] [5R:yellow, brown, green] [N/A:N/A] Wound Margin: [4:Distinct, outline attached] [5R:Flat and Intact] [N/A:N/A] Granulation Amount: [4:Small (1-33%)] [5R:Large (67-100%)]  [N/A:N/A] Granulation Quality: [4:Pink] [5R:Red, Pink] [N/A:N/A] Necrotic Amount: [4:Large (67-100%)] [5R:None Present (0%)] [N/A:N/A] Exposed Structures: [4:Fascia: No Fat: No Tendon: No Muscle: No] [5R:Fascia: No Fat: No Tendon: No Muscle: No] [N/A:N/A] Joint: No Joint: No Bone: No Bone: No Limited to Skin Limited to Skin Breakdown Breakdown Epithelialization: None Medium (34-66%) N/A Periwound Skin Texture: Edema: Yes Edema: Yes N/A Excoriation: No Induration: No Callus: No Crepitus: No Fluctuance: No Friable: No Rash: No Scarring: No Periwound Skin Maceration: Yes Moist: Yes N/A Moisture: Moist: Yes Maceration: No Dry/Scaly: No Periwound Skin Color: Erythema: Yes Atrophie Blanche: No N/A Hemosiderin Staining: Yes Cyanosis: No Ecchymosis: No Erythema: No Hemosiderin Staining: No Mottled: No Pallor: No Rubor: No Erythema Location: Circumferential N/A N/A Temperature: No Abnormality No Abnormality N/A Tenderness on Yes No N/A Palpation: Wound Preparation: Ulcer Cleansing: Other: Ulcer Cleansing: N/A soap and water Rinsed/Irrigated with Saline Topical Anesthetic Applied: Other: lidocaine Topical Anesthetic 4% Applied: None Treatment Notes Electronic Signature(s) Signed: 04/04/2015 5:02:54 PM By: Montey Hora Entered By: Montey Hora on 04/04/2015 09:58:08 Philip [name] ([id number]) -------------------------------------------------------------------------------- [place] Details Patient Name: Philip [name], Philip [name]. Date of Service: 04/04/2015 9:30 AM Medical Record Number: [id number] Patient Account Number: [id number] Date of Birth/Sex: 02-May-1918 (81 y.o. Male) Treating RN: Montey Hora Primary Care Physician: Hortencia Pilar Other Clinician: Referring Physician: Hortencia Pilar Treating Physician/Extender: Frann Rider in Treatment: 74 Active Inactive Abuse / Safety / Falls / Self Care Management Nursing Diagnoses: Abuse or  neglect; actual or potential Potential for falls Goals: Patient will remain injury free Date Initiated: 05/02/2014 Goal Status: Active Interventions: Assess fall risk on admission and as needed Notes: Necrotic Tissue Nursing Diagnoses: Impaired tissue integrity related to necrotic/devitalized tissue Goals: Necrotic/devitalized tissue will be minimized in the wound bed Date Initiated: 05/02/2014 Goal Status: Active Interventions: Assess patient pain level pre-, during and post procedure and prior to discharge Treatment Activities: Apply topical anesthetic as ordered : 04/04/2015 Notes: Orientation to the Wound Care Program Nursing Diagnoses: Knowledge deficit related to the wound healing center program Philip [name], Philip [name] ([id number]) Goals: Patient/caregiver will verbalize understanding of the [place] Program Date Initiated: 05/02/2014 Goal Status: Active Interventions: Provide education on orientation to the wound center Notes: Electronic Signature(s) Signed: 04/04/2015 5:02:54 PM By: Montey Hora Entered By: Montey Hora on 04/04/2015 09:57:58 Philip [name] ([id number]) -------------------------------------------------------------------------------- Pain Assessment Details Patient Name: Philip [name]. Date of Service: 04/04/2015 9:30 AM Medical Record Number: [id number] Patient Account Number: [id number] Date of Birth/Sex: 04/30/1918 (81 y.o. Male) Treating RN: Montey Hora Primary Care Physician: Hortencia Pilar Other Clinician: Referring Physician: Hortencia Pilar Treating Physician/Extender: Frann Rider in Treatment: 50 Active Problems  Location of Pain Severity and Description of Pain Patient Has Paino Yes Site Locations Pain Location: Pain in Ulcers With Dressing Change: Yes Duration of the Pain. Constant / Intermittento Constant Pain Management and Medication Current Pain Management: Electronic Signature(s) Signed: 04/04/2015  5:02:54 PM By: Montey Hora Entered By: Montey Hora on 04/04/2015 09:39:34 Philip [name] ([id number]) -------------------------------------------------------------------------------- Patient/Caregiver Education Details Patient Name: Philip [name], Philip [name]. Date of Service: 04/04/2015 9:30 AM Medical Record Number: [id number] Patient Account Number: [id number] Date of Birth/Gender: [birth year] (81 y.o. Male) Treating RN: Montey Hora Primary Care Physician: Hortencia Pilar Other Clinician: Referring Physician: Hortencia Pilar Treating Physician/Extender: Frann Rider in Treatment: 49 Education Assessment Education Provided To: Patient and Caregiver Education Topics Provided Wound/Skin Impairment: Handouts: Other: wound care as ordered Methods: Demonstration, Explain/Verbal Responses: State content correctly Electronic Signature(s) Signed: 04/04/2015 5:02:54 PM By: Montey Hora Entered By: Montey Hora on 04/04/2015 09:59:04 Philip [name] ([id number]) -------------------------------------------------------------------------------- Wound Assessment Details Patient Name: Philip [name], Philip [name]. Date of Service: 04/04/2015 9:30 AM Medical Record Number: [id number] Patient Account Number: [id number] Date of Birth/Sex: 07/04/1918 (81 y.o. Male) Treating RN: Montey Hora Primary Care Physician: Hortencia Pilar Other Clinician: Referring Physician: Hortencia Pilar Treating Physician/Extender: Frann Rider in Treatment: 48 Wound Status Wound Number: 4 Primary Malignant Wound Etiology: Wound Location: Left Malleolus - Medial Wound Status: Open Wounding Event: Other Lesion Comorbid Cataracts, Arrhythmia, Congestive Date Acquired: 09/20/2014 History: Heart Failure Weeks Of Treatment: 28 Clustered Wound: No Photos Photo Uploaded By: Montey Hora on 04/04/2015 12:55:21 Wound Measurements Length: (cm) 3.3 Width: (cm) 2.4 Depth: (cm) 0.3 Area: (cm) 6.22 Volume:  (cm) 1.866 % Reduction in Area: -3861.8% % Reduction in Volume: -11562.5% Epithelialization: None Tunneling: No Undermining: No Wound Description Full Thickness With Exposed Foul Odor Afte Classification: Support Structures Wound Margin: Distinct, outline attached Exudate Large Amount: Exudate Type: Serous Exudate Color: amber r Cleansing: No Wound Bed Granulation Amount: Small (1-33%) Exposed Structure Granulation Quality: Pink Fascia Exposed: No Necrotic Amount: Large (67-100%) Fat Layer Exposed: No Philip [name], Philip [name] ([id number]) Necrotic Quality: Adherent Slough Tendon Exposed: No Muscle Exposed: No Joint Exposed: No Bone Exposed: No Limited to Skin Breakdown Periwound Skin Texture Texture Color No Abnormalities Noted: No No Abnormalities Noted: No Localized Edema: Yes Erythema: Yes Erythema Location: Circumferential Moisture Hemosiderin Staining: Yes No Abnormalities Noted: No Maceration: Yes Temperature / Pain Moist: Yes Temperature: No Abnormality Tenderness on Palpation: Yes Wound Preparation Ulcer Cleansing: Other: soap and water, Topical Anesthetic Applied: Other: lidocaine 4%, Treatment Notes Wound #4 (Left, Medial Malleolus) 1. Cleansed with: Cleanse wound with antibacterial soap and water 2. Anesthetic Topical Lidocaine 4% cream to wound bed prior to debridement 4. Dressing Applied: Hydrafera Blue 5. Secondary Dressing Applied Gauze and Kerlix/Conform 7. Secured with Tape Notes kerlix, coban Electronic Signature(s) Signed: 04/04/2015 5:02:54 PM By: Montey Hora Entered By: Montey Hora on 04/04/2015 09:50:59 Philip [name] ([id number]) -------------------------------------------------------------------------------- Wound Assessment Details Patient Name: Philip [name], Philip [name]. Date of Service: 04/04/2015 9:30 AM Medical Record Number: [id number] Patient Account Number: [id number] Date of Birth/Sex: 23-Feb-1919 (81 y.o. Male) Treating RN:  Montey Hora Primary Care Physician: Hortencia Pilar Other Clinician: Referring Physician: Hortencia Pilar Treating Physician/Extender: Frann Rider in Treatment: 48 Wound Status Wound Number: 5R Primary Skin Tear Etiology: Wound Location: Right Forearm - Lateral Wound Status: Open Wounding Event: Shear/Friction Comorbid Cataracts, Arrhythmia, Congestive Date Acquired: 09/22/2014 History: Heart Failure Weeks Of Treatment: 26 Clustered Wound: No Photos Photo Uploaded By: Montey Hora on 04/04/2015 12:55:23 Wound Measurements Length: (cm) 6 Width: (  cm) 2.3 Depth: (cm) 0.1 Area: (cm) 10.838 Volume: (cm) 1.084 % Reduction in Area: -72.5% % Reduction in Volume: -72.6% Epithelialization: Medium (34-66%) Tunneling: No Undermining: No Wound Description Classification: Partial Thickness Foul Odor Afte Wound Margin: Flat and Intact Exudate Amount: Large Exudate Type: Purulent Exudate Color: yellow, brown, green r Cleansing: No Wound Bed Granulation Amount: Large (67-100%) Exposed Structure Granulation Quality: Red, Pink Fascia Exposed: No Necrotic Amount: None Present (0%) Fat Layer Exposed: No Tendon Exposed: No Philip [name], Philip [name] ([id number]) Muscle Exposed: No Joint Exposed: No Bone Exposed: No Limited to Skin Breakdown Periwound Skin Texture Texture Color No Abnormalities Noted: No No Abnormalities Noted: No Callus: No Atrophie Blanche: No Crepitus: No Cyanosis: No Excoriation: No Ecchymosis: No Fluctuance: No Erythema: No Friable: No Hemosiderin Staining: No Induration: No Mottled: No Localized Edema: Yes Pallor: No Rash: No Rubor: No Scarring: No Temperature / Pain Moisture Temperature: No Abnormality No Abnormalities Noted: No Dry / Scaly: No Maceration: No Moist: Yes Wound Preparation Ulcer Cleansing: Rinsed/Irrigated with Saline Topical Anesthetic Applied: None Treatment Notes Wound #5R (Right, Lateral Forearm) 1. Cleansed  with: Clean wound with Normal Saline 4. Dressing Applied: Other dressing (specify in notes) 5. Secondary Dressing Applied Kerlix/Conform Notes coban, hydroactive B Electronic Signature(s) Signed: 04/04/2015 5:02:54 PM By: Montey Hora Entered By: Montey Hora on 04/04/2015 09:50:08 Philip [name] ([id number]) -------------------------------------------------------------------------------- [place] Details Patient Name: CHRISTOPHR, MILETO. Date of Service: 04/04/2015 9:30 AM Medical Record Number: [id number] Patient Account Number: [id number] Date of Birth/Sex: 05/10/1918 (81 y.o. Male) Treating RN: Montey Hora Primary Care Physician: Hortencia Pilar Other Clinician: Referring Physician: Hortencia Pilar Treating Physician/Extender: Frann Rider in Treatment: 35 Vital Signs Time Taken: 09:39 Temperature (F): 97.9 Height (in): 69 Pulse (bpm): 69 Weight (lbs): 179 Respiratory Rate (breaths/min): 18 Body Mass Index (BMI): 26.4 Blood Pressure (mmHg): 128/47 Reference Range: 80 - 120 mg / dl Electronic Signature(s) Signed: 04/04/2015 5:02:54 PM By: Montey Hora Entered By: Montey Hora on 04/04/2015 09:40:48

## 2015-04-09 ENCOUNTER — Other Ambulatory Visit: Payer: Self-pay | Admitting: Vascular Surgery

## 2015-04-10 ENCOUNTER — Encounter: Payer: Medicare Other | Admitting: Surgery

## 2015-04-10 DIAGNOSIS — I70232 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: Secondary | ICD-10-CM | POA: Diagnosis not present

## 2015-04-10 NOTE — Progress Notes (Addendum)
Philip, [name] ([id number]) Visit Report for 04/10/2015 Chief Complaint Document Details Patient Name: Philip [name], Philip [name]. Date of Service: 04/10/2015 10:00 AM Medical Record Number: [id number] Patient Account Number: [id number] Date of Birth/Sex: [birth year] (81 y.o. Male) Treating RN: Montey Hora Primary Care Physician: Hortencia Pilar Other Clinician: Referring Physician: Hortencia Pilar Treating Physician/Extender: Frann Rider in Treatment: 1 Information Obtained from: Patient Chief Complaint R foot ulcer. L forearm ulcer. 07/19/2014 -- about 2 weeks ago he had a surgical procedure done by dermatologist in [place] and has an open surgical wound on the dorsum of the right foot. Electronic Signature(s) Signed: 04/10/2015 10:32:57 AM By: Christin Fudge MD, FACS Entered By: Christin Fudge on 04/10/2015 10:32:57 [name] ([id number]) -------------------------------------------------------------------------------- HPI Details Patient Name: Philip, [name]. Date of Service: 04/10/2015 10:00 AM Medical Record Number: [id number] Patient Account Number: [id number] Date of Birth/Sex: 22-Mar-1919 (81 y.o. Male) Treating RN: Montey Hora Primary Care Physician: Hortencia Pilar Other Clinician: Referring Physician: Hortencia Pilar Treating Physician/Extender: Frann Rider in Treatment: 27 History of Present Illness Location: right leg Duration: Dec 2015 Modifying Factors: history of an injury to the right leg with resulting hematoma and thrombophlebitis and later an ulcer of posterior leg Associated Signs and Symptoms: marked lymphedema of the right leg. He is already on Eloquis. HPI Description: 06/21/14 -- after he sustained a fall this week earlier he applied a bandage over this himself and did not seek any medical attention. he did however manage to control the bleeding and had a dressing in place the next morning when his son to the visit. In this dressing was removed  there was further damaged skin. His right leg has been doing fine otherwise. 07/12/14 --Very pleasant 81 year old with past medical history significant for congestive heart failure (EF 15%), peripheral vascular disease, and chronic kidney disease. He was hospitalized at [facility] in December 2015 for congestive heart failure. He says that he fell during his hospital course and developed a hematoma over his right calf. He was also diagnosed with a right lower extremity DVT for which he takes Eliquis. The hematoma subsequently turned into an ulceration around Christmas, which has healed. He subsequently developed an ulcer on his right dorsal foot and a traumatic left forearm ulcer. Per his report, he underwent biopsy of the right dorsal foot ulceration which demonstrated a skin cancer. His PCP and dermatologist office are both closed today. I reviewed his records in [place] but find no report of biopsy or pathology. He s without complaints today. No significant pain. No fever or chills. Minimal drainage. 07/19/2014 - the patient and his son tell me that about 2 weeks ago the dermatologist did a skin biopsy and this was a large area on the dorsum of his right foot which was left open and no dressing instructions were recommended. Since then he has been called and told that it is a cancer and the need to do a further procedure but that will not happen until about 2 weeks from now. In the meanwhile the patient has not been taking care of his right foot. The left forearm where he had an abrasion and laceration is doing very well. 07/26/2014 -- Reports from 07/08/2014 from the dermatology group reviewed. A excision was done of a lesion located on the dorsum of the right foot and this was 1.7 cm in diameter which was a shave biopsy performed. The wound was left open after appropriate cauterization and the patient was given this dressing instructions. The pathology report dated  07/08/2014 revealed that it was a  squamous cell carcinoma well- differentiated and the edges were involved. 08/02/2014 -- all the original problems he came with have completely resolved. He now has a surgical wound on his right foot dorsum where a skin cancer was excised. He goes to see his dermatologist this coming Tuesday and will have definite news next Friday. 08/09/2014 he had gone to his dermatologist on Tuesday and she has injected the base of his ulcer with some chemotherapeutic agent. He was supposed to bring some papers with him but forgot to get them and CASMIR, VOTA. ([id number]) will bring them in next week. Other than that the dermatologist had suggested using Mehdi honey on the wound. 08/16/2014 -- the patient has brought in his notes from the dermatologist and on 08/06/2014 he received a injection of 5 FU, 500 mg grams into the lesion. the pathology report was also sent and it was a squamous cell carcinoma well-differentiated and edges were involved. They wanted him to use many honey for the wound dressing changes to be done 3 times a week. 08/30/2014 -- he has finished his second injection of 5-FU and has the next one in 2 weeks' time. He is doing fine otherwise. 09/13/2014 - No new complaints. No significant pain. No fever or chills. Minimal drainage. Still receiving 5-FU injections. 09/20/2014 -- He was seen by the dermatologist on 09/10/2014 and Dr. Phillip Heal injected his foot both the right on the dorsum and left near the medial malleolus with 5-FU. The next dose of 5-FU is to be given after 3 months. The patient says he now has a spot on the left medial malleolus where he was injected with 5-FU. 09/27/2014 -- the area on the left ankle where he was injected with 5-FU is now a full-blown ulcer. He also has mild pain in both ankle areas. 10/04/2014 - large had a bit of a fall and injured his right arm last evening and has had a laceration with no evidence of any foreign body in the right  forearm. 12/20/2014 -- he recently saw his dermatologist at [facility] and she was pleased with his wound healing on the right lower extremity. She has not given him any further 5-FU injections and will see him back on a when necessary basis. 01/03/2015 -- his skin substitute Grafix has been approved by his insurance and we will go ahead with this next week. 01/10/2015 -- he has his first application of Grafix today. he recently had a nightmare and injured his right forearm and had some abrasions. 01/13/2015 -- Iona Beard has developed significant swelling of the left calf with some tenderness of the calf. This was only noticed this morning. Addendum: The DVT study done in the hospital -- IMPRESSION:No evidence of deep venous thrombosis left lower extremity. The result was called into the patient's son who acknowledges the report and will bring the patient back on Friday 01/17/2015 -- he saw his PCP Dr. Hoy Morn and she has increases dosage of Lasix and his edema on the left lower extremity is looking much better. He is here for a second applications of Grafix [id number] -- he is overall doing very well his edema has come down significantly. He is here for his third application of grafix. 02/04/2015 -- he has no new issues and is here for his fourth application of grafix [id number] -- he is here for a wound review today and has no fresh issues. 02/21/2015 -- he has no new issues and is here for his  fifth and last application of grafix. 03/21/2015 -- he has had one of the vascular test done last week and is awaiting the second one. Other than that his health has not changed in any way. 03/28/2015 -- we tried hard but no vascular reports are available yet and his next test is on December 29. We've had him authorized for [place], but I'm awaiting his vascular workup and I believe it won't be too early January when we will take a decision regarding this. KAYSE, LOSEE ([id number]) 04/10/2015 -- he  was seen by Dr. Lucky Cowboy this week and though we do not have official reports from what I understand an arterial procedure is planned for the first week of January to try and improve his circulation in the left lower extremity. Electronic Signature(s) Signed: 04/10/2015 11:09:52 AM By: Christin Fudge MD, FACS Previous Signature: 04/10/2015 10:33:44 AM Version By: Christin Fudge MD, FACS Entered By: Christin Fudge on 04/10/2015 11:09:51 [name] ([id number]) -------------------------------------------------------------------------------- Physical Exam Details Patient Name: SAVERIO, SORTO. Date of Service: 04/10/2015 10:00 AM Medical Record Number: [id number] Patient Account Number: [id number] Date of Birth/Sex: Mar 30, 1919 (81 y.o. Male) Treating RN: Montey Hora Primary Care Physician: Hortencia Pilar Other Clinician: Referring Physician: Hortencia Pilar Treating Physician/Extender: Frann Rider in Treatment: 31 Constitutional . Pulse regular. Respirations normal and unlabored. Afebrile. . Eyes Nonicteric. Reactive to light. Ears, Nose, Mouth, and Throat Lips, teeth, and gums WNL.Marland Kitchen Moist mucosa without lesions. Neck supple and nontender. No palpable supraclavicular or cervical adenopathy. Normal sized without goiter. Respiratory WNL. No retractions.. Cardiovascular Pedal Pulses WNL. No clubbing, cyanosis or edema. Lymphatic No adneopathy. No adenopathy. No adenopathy. Musculoskeletal Adexa without tenderness or enlargement.. Digits and nails w/o clubbing, cyanosis, infection, petechiae, ischemia, or inflammatory conditions.. Integumentary (Hair, Skin) No suspicious lesions. No crepitus or fluctuance. No peri-wound warmth or erythema. No masses.Marland Kitchen Psychiatric Judgement and insight Intact.. No evidence of depression, anxiety, or agitation.. Notes the right arm is looking excellent and there is no maceration and almost completely healed. The left medial ankle has healthy  granulation tissue today and there is some fascia exposed but there is no cellulitis surrounding it. The edema on his left lower extremity has improved markedly. Electronic Signature(s) Signed: 04/10/2015 11:08:38 AM By: Christin Fudge MD, FACS Previous Signature: 04/10/2015 10:34:25 AM Version By: Christin Fudge MD, FACS Entered By: Christin Fudge on 04/10/2015 11:08:38 [name] ([id number]) -------------------------------------------------------------------------------- Physician Orders Details Patient Name: ELIEZAR, HOCKADAY. Date of Service: 04/10/2015 10:00 AM Medical Record Number: [id number] Patient Account Number: [id number] Date of Birth/Sex: March 02, 1919 (81 y.o. Male) Treating RN: Montey Hora Primary Care Physician: Hortencia Pilar Other Clinician: Referring Physician: Hortencia Pilar Treating Physician/Extender: Frann Rider in Treatment: 39 Verbal / Phone Orders: Yes Clinician: Montey Hora Read Back and Verified: Yes Diagnosis Coding ICD-10 Coding Code Description [phone] 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 I82.401 Acute embolism and thrombosis of unspecified deep veins of right lower extremity Z92.21 Personal history of antineoplastic chemotherapy L97.522 Non-pressure chronic ulcer of other part of left foot with fat layer exposed S41.111A Laceration without foreign body of right upper arm, initial encounter Wound Cleansing Wound #4 Left,Medial Malleolus o Cleanse wound with mild soap and water Wound #5R Right,Lateral Forearm o Cleanse wound with mild soap and water Anesthetic Wound #4 Left,Medial Malleolus o Topical Lidocaine 4% cream applied to wound bed prior to debridement Wound #5R Right,Lateral Forearm o Topical Lidocaine 4% cream applied to wound bed prior to debridement Primary  Wound Dressing Wound #4 Left,Medial Malleolus o Hydrafera Blue - please cut hydrofera blue slightly smaller than wound and  cut slits in the shinny side so drainage can come through the to secondary bandage Wound #5R Right,Lateral Forearm o Other: - Mepilex Lite Secondary Dressing Wound #4 Left,Medial Malleolus o ABD pad Wound #5R Right,Lateral Forearm JAKERYAN, BURNET. ([id number]) o Gauze, ABD and Kerlix/Conform - coban lightly to secure Dressing Change Frequency Wound #4 Left,Medial Malleolus o Three times weekly Wound #5R Right,Lateral Forearm o Change dressing every week Follow-up Appointments Wound #4 Left,Medial Malleolus o Return Appointment in 2 weeks. Wound #5R Right,Lateral Forearm o Return Appointment in 2 weeks. Edema Control o 2 Layer Compression System - Left Lower Extremity - kerlix, coban from toes to 3cm below the knee [place] #4 [place] Visits - [place] to visit patient Monday, Wednesday and Friday week of 04/14/2015 and Monday, Wednesday week of 04/21/2015 o Home Health Nurse may visit PRN to address patientos wound care needs. o FACE TO FACE ENCOUNTER: MEDICARE and MEDICAID PATIENTS: I certify that this patient is under my care and that I had a face-to-face encounter that meets the physician face-to-face encounter requirements with this patient on this date. The encounter with the patient was in whole or in part for the following MEDICAL CONDITION: (primary reason for [place]) MEDICAL NECESSITY: I certify, that based on my findings, NURSING services are a medically necessary home health service. HOME BOUND STATUS: I certify that my clinical findings support that this patient is homebound (i.e., Due to illness or injury, pt requires aid of supportive devices such as crutches, cane, wheelchairs, walkers, the use of special transportation or the assistance of another person to leave their place of residence. There is a normal inability to leave the home and doing so requires considerable and taxing  effort. Other absences are for medical reasons / religious services and are infrequent or of short duration when for other reasons). o If current dressing causes regression in wound condition, may D/C ordered dressing product/s and apply Normal Saline Moist Dressing daily until next [place] / Other MD appointment. [place] of regression in wound condition at [phone]. o Please direct any NON-WOUND related issues/requests for orders to patient's Primary Care Physician Wound #5R Right,Lateral Forearm o [place] Visits - [place] to visit patient Monday, Wednesday and Friday week of 04/14/2015 and Monday, Wednesday week of 04/21/2015 o Home Health Nurse may visit PRN to address patientos wound care needs. o FACE TO FACE ENCOUNTER: MEDICARE and MEDICAID PATIENTS: I certify that this patient is under my care and that I had a face-to-face encounter that meets the physician face-to-face JAYDON, PUSKARICH ([id number]) encounter requirements with this patient on this date. The encounter with the patient was in whole or in part for the following MEDICAL CONDITION: (primary reason for [place]) MEDICAL NECESSITY: I certify, that based on my findings, NURSING services are a medically necessary home health service. HOME BOUND STATUS: I certify that my clinical findings support that this patient is homebound (i.e., Due to illness or injury, pt requires aid of supportive devices such as crutches, cane, wheelchairs, walkers, the use of special transportation or the assistance of another person to leave their place of residence. There is a normal inability to leave the home and doing so requires considerable and taxing effort. Other absences are for medical reasons / religious services and are infrequent  or of short duration when for other reasons). o If current dressing causes regression in wound condition, may D/C ordered dressing  product/s and apply Normal Saline Moist Dressing daily until next [name] / Other MD appointment. [place] of regression in wound condition at [phone]. o Please direct any NON-WOUND related issues/requests for orders to patient's Primary Care Physician Electronic Signature(s) Signed: 04/10/2015 5:05:23 PM By: Christin Fudge MD, FACS Signed: 04/10/2015 5:45:44 PM By: Montey Hora Entered By: Montey Hora on 04/10/2015 10:53:55 [name] ([id number]) -------------------------------------------------------------------------------- Problem List Details Patient Name: VICTORHUGO, SPITALE. Date of Service: 04/10/2015 10:00 AM Medical Record Number: [id number] Patient Account Number: [id number] Date of Birth/Sex: Aug 09, 1918 (81 y.o. Male) Treating RN: Montey Hora Primary Care Physician: Hortencia Pilar Other Clinician: Referring Physician: Hortencia Pilar Treating Physician/Extender: Frann Rider in Treatment: 20 Active Problems ICD-10 Encounter Code Description Active Date Diagnosis I70.232 Atherosclerosis of native arteries of right leg with 06/21/2014 Yes ulceration of calf I82.401 Acute embolism and thrombosis of unspecified deep veins 06/21/2014 Yes of right lower extremity Z92.21 Personal history of antineoplastic chemotherapy 08/23/2014 Yes L97.522 Non-pressure chronic ulcer of other part of left foot with fat 09/20/2014 Yes layer exposed S41.111A Laceration without foreign body of right upper arm, initial 10/04/2014 Yes encounter Inactive Problems Resolved Problems ICD-10 Code Description Active Date Resolved Date L97.212 Non-pressure chronic ulcer of right calf with fat layer 06/21/2014 06/21/2014 exposed S51.812A Laceration without foreign body of left forearm, initial 06/21/2014 06/21/2014 encounter S91.301A Unspecified open wound, right foot, initial encounter 07/19/2014 07/19/2014 [name] ([id number]) Electronic  Signature(s) Signed: 04/10/2015 10:32:41 AM By: Christin Fudge MD, FACS Entered By: Christin Fudge on 04/10/2015 10:32:41 [name] ([id number]) -------------------------------------------------------------------------------- Progress Note Details Patient Name: [name]. Date of Service: 04/10/2015 10:00 AM Medical Record Number: [id number] Patient Account Number: [id number] Date of Birth/Sex: [birth year] (81 y.o. Male) Treating RN: Montey Hora Primary Care Physician: Hortencia Pilar Other Clinician: Referring Physician: Hortencia Pilar Treating Physician/Extender: Frann Rider in Treatment: 6 Subjective Chief Complaint Information obtained from Patient R foot ulcer. L forearm ulcer. 07/19/2014 -- about 2 weeks ago he had a surgical procedure done by dermatologist in [place] and has an open surgical wound on the dorsum of the right foot. History of Present Illness (HPI) The following HPI elements were documented for the patient's wound: Location: right leg Duration: Dec 2015 Modifying Factors: history of an injury to the right leg with resulting hematoma and thrombophlebitis and later an ulcer of posterior leg Associated Signs and Symptoms: marked lymphedema of the right leg. He is already on Eloquis. 06/21/14 -- after he sustained a fall this week earlier he applied a bandage over this himself and did not seek any medical attention. he did however manage to control the bleeding and had a dressing in place the next morning when his son to the visit. In this dressing was removed there was further damaged skin. His right leg has been doing fine otherwise. 07/12/14 --Very pleasant 81 year old with past medical history significant for congestive heart failure (EF 15%), peripheral vascular disease, and chronic kidney disease. He was hospitalized at [facility] in December 2015 for congestive heart failure. He says that he fell during his hospital course and developed a  hematoma over his right calf. He was also diagnosed with a right lower extremity DVT for which he takes Eliquis. The hematoma subsequently turned into an ulceration around Christmas, which has healed. He subsequently developed an ulcer on his right dorsal  foot and a traumatic left forearm ulcer. Per his report, he underwent biopsy of the right dorsal foot ulceration which demonstrated a skin cancer. His PCP and dermatologist office are both closed today. I reviewed his records in [place] but find no report of biopsy or pathology. He s without complaints today. No significant pain. No fever or chills. Minimal drainage. 07/19/2014 - the patient and his son tell me that about 2 weeks ago the dermatologist did a skin biopsy and this was a large area on the dorsum of his right foot which was left open and no dressing instructions were recommended. Since then he has been called and told that it is a cancer and the need to do a further procedure but that will not happen until about 2 weeks from now. In the meanwhile the patient has not been taking care of his right foot. The left forearm where he had an abrasion and laceration is doing very well. 07/26/2014 -- Reports from 07/08/2014 from the dermatology group reviewed. A excision was done of a lesion located on the dorsum of the right foot and this was 1.7 cm in diameter which was a shave biopsy performed. The wound was left open after appropriate cauterization and the patient was given this dressing WALLIE, GUY. ([id number]) instructions. 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 08/02/2014 -- all the original problems he came with have completely resolved. He now has a surgical wound on his right foot dorsum where a skin cancer was excised. He goes to see his dermatologist this coming Tuesday and will have definite news next Friday. 08/09/2014 he had gone  to his dermatologist on Tuesday and she has injected the base of his ulcer with some chemotherapeutic agent. He was supposed to bring some papers with him but forgot to get them and will bring them in next week. Other than that the dermatologist had suggested using Mehdi honey on the wound. 08/16/2014 -- the patient has brought in his notes from the dermatologist and on 08/06/2014 he received a injection of 5 FU, 500 mg grams into the lesion. the pathology report was also sent and it was a squamous cell carcinoma well-differentiated and edges were involved. They wanted him to use many honey for the wound dressing changes to be done 3 times a week. 08/30/2014 -- he has finished his second injection of 5-FU and has the next one in 2 weeks' time. He is doing fine otherwise. 09/13/2014 - No new complaints. No significant pain. No fever or chills. Minimal drainage. Still receiving 5-FU injections. 09/20/2014 -- He was seen by the dermatologist on 09/10/2014 and Dr. Phillip Heal injected his foot both the right on the dorsum and left near the medial malleolus with 5-FU. The next dose of 5-FU is to be given after 3 months. The patient says he now has a spot on the left medial malleolus where he was injected with 5-FU. 09/27/2014 -- the area on the left ankle where he was injected with 5-FU is now a full-blown ulcer. He also has mild pain in both ankle areas. 10/04/2014 - large had a bit of a fall and injured his right arm last evening and has had a laceration with no evidence of any foreign body in the right forearm. 12/20/2014 -- he recently saw his dermatologist at [name] and she was pleased with his wound healing on the right lower extremity. She has not given him any  further 5-FU injections and will see him back on a when necessary basis. 01/03/2015 -- his skin substitute Grafix has been approved by his insurance and we will go ahead with this next week. 01/10/2015 -- he has his first application of  Grafix today. he recently had a nightmare and injured his right forearm and had some abrasions. 01/13/2015 -- Iona Beard has developed significant swelling of the left calf with some tenderness of the calf. This was only noticed this morning. Addendum: The DVT study done in the hospital -- IMPRESSION:No evidence of deep venous thrombosis left lower extremity. The result was called into the patient's son who acknowledges the report and will bring the patient back on Friday 01/17/2015 -- he saw his PCP Dr. Hoy Morn and she has increases dosage of Lasix and his edema on the left lower extremity is looking much better. He is here for a second applications of Grafix [id number] -- he is overall doing very well his edema has come down significantly. He is here for his third application of grafix. TUSHAR, DENNEE ([id number]) 02/04/2015 -- he has no new issues and is here for his fourth application of grafix [id number] -- he is here for a wound review today and has no fresh issues. 02/21/2015 -- he has no new issues and is here for his fifth and last application of grafix. 03/21/2015 -- he has had one of the vascular test done last week and is awaiting the second one. Other than that his health has not changed in any way. 03/28/2015 -- we tried hard but no vascular reports are available yet and his next test is on December 29. We've had him authorized for Theraskin, but I'm awaiting his vascular workup and I believe it won't be too early January when we will take a decision regarding this. 04/10/2015 -- he was seen by Dr. Lucky Cowboy this week and though we do not have official reports from what I understand an arterial procedure is planned for the first week of January to try and improve his circulation in the left lower extremity. Objective Constitutional Pulse regular. Respirations normal and unlabored. Afebrile. Vitals Time Taken: 10:16 AM, Height: 69 in, Weight: 179 lbs, BMI: 26.4, Temperature: 97.7 F,  Pulse: 67 bpm, Respiratory Rate: 18 breaths/min, Blood Pressure: 150/45 mmHg. Eyes Nonicteric. Reactive to light. Ears, Nose, Mouth, and Throat Lips, teeth, and gums WNL.Marland Kitchen Moist mucosa without lesions. Neck supple and nontender. No palpable supraclavicular or cervical adenopathy. Normal sized without goiter. Respiratory WNL. No retractions.. Cardiovascular Pedal Pulses WNL. No clubbing, cyanosis or edema. Lymphatic No adneopathy. No adenopathy. No adenopathy. Musculoskeletal Adexa without tenderness or enlargement.. Digits and nails w/o clubbing, cyanosis, infection, petechiae, ischemia, or inflammatory conditions.Marland Kitchen Psychiatric KAZUMI, BRUMBLEY ([id number]) Judgement and insight Intact.. No evidence of depression, anxiety, or agitation.. General Notes: the right arm is looking excellent and there is no maceration and almost completely healed. The left medial ankle has healthy granulation tissue today and there is some fascia exposed but there is no cellulitis surrounding it. The edema on his left lower extremity has improved markedly. Integumentary (Hair, Skin) No suspicious lesions. No crepitus or fluctuance. No peri-wound warmth or erythema. No masses.. Wound #4 status is Open. Original cause of wound was Other Lesion. The wound is located on the Left,Medial Malleolus. The wound measures 3.2cm length x 2.6cm width x 0.3cm depth; 6.535cm^2 area and 1.96cm^3 volume. The wound is limited to skin breakdown. There is no tunneling or undermining noted. There is a large amount  of serous drainage noted. The wound margin is distinct with the outline attached to the wound base. There is medium (34-66%) pink granulation within the wound bed. There is a medium (34- 66%) amount of necrotic tissue within the wound bed including Adherent Slough. The periwound skin appearance exhibited: Localized Edema, Maceration, Moist, Hemosiderin Staining, Erythema. The surrounding wound skin color is noted with  erythema which is circumferential. Periwound temperature was noted as No Abnormality. The periwound has tenderness on palpation. Wound #5R status is Open. Original cause of wound was Shear/Friction. The wound is located on the Right,Lateral Forearm. The wound measures 5cm length x 4.3cm width x 0.1cm depth; 16.886cm^2 area and 1.689cm^3 volume. The wound is limited to skin breakdown. There is no tunneling or undermining noted. There is a large amount of serosanguineous drainage noted. The wound margin is flat and intact. There is large (67-100%) red, pink granulation within the wound bed. There is no necrotic tissue within the wound bed. The periwound skin appearance exhibited: Localized Edema, Moist. The periwound skin appearance did not exhibit: Callus, Crepitus, Excoriation, Fluctuance, Friable, Induration, Rash, Scarring, Dry/Scaly, Maceration, Atrophie Blanche, Cyanosis, Ecchymosis, Hemosiderin Staining, Mottled, Pallor, Rubor, Erythema. Periwound temperature was noted as No Abnormality. 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 I82.401 - Acute embolism and thrombosis of unspecified deep veins of right lower extremity Z92.21 - Personal history of antineoplastic chemotherapy L97.522 - Non-pressure chronic ulcer of other part of left foot with fat layer exposed S41.111A - Laceration without foreign body of right upper arm, initial encounter Plan TRAYCEN, CIESLIK ([id number]) Wound Cleansing: Wound #4 Left,Medial Malleolus: Cleanse wound with mild soap and water Wound #5R Right,Lateral Forearm: Cleanse wound with mild soap and water Anesthetic: Wound #4 Left,Medial Malleolus: Topical Lidocaine 4% cream applied to wound bed prior to debridement Wound #5R Right,Lateral Forearm: Topical Lidocaine 4% cream applied to wound bed prior to debridement Primary Wound Dressing: Wound #4 Left,Medial Malleolus: Hydrafera  Blue - please cut hydrofera blue slightly smaller than wound and cut slits in the shinny side so drainage can come through the to secondary bandage Wound #5R Right,Lateral Forearm: Other: - Mepilex Lite Secondary Dressing: Wound #4 Left,Medial Malleolus: ABD pad Wound #5R Right,Lateral Forearm: Gauze, ABD and Kerlix/Conform - coban lightly to secure Dressing Change Frequency: Wound #4 Left,Medial Malleolus: Three times weekly Wound #5R Right,Lateral Forearm: Change dressing every week Follow-up Appointments: Wound #4 Left,Medial Malleolus: Return Appointment in 2 weeks. Wound #5R Right,Lateral Forearm: Return Appointment in 2 weeks. Edema Control: 2 Layer Compression System - Left Lower Extremity - kerlix, coban from toes to 3cm below the knee Home Health: Wound #4 Left,Medial Malleolus: [place] to visit patient Monday, Wednesday and Friday week of 04/14/2015 and Monday, Wednesday week of 04/21/2015 Home Health Nurse may visit PRN to address patient s wound care needs. FACE TO FACE ENCOUNTER: MEDICARE and MEDICAID PATIENTS: I certify that this patient is under my care and that I had a face-to-face encounter that meets the physician face-to-face encounter requirements with this patient on this date. The encounter with the patient was in whole or in part for the following MEDICAL CONDITION: (primary reason for [place]) MEDICAL NECESSITY: I certify, that based on my findings, NURSING services are a medically necessary home health service. HOME BOUND STATUS: I certify that my clinical findings support that this patient is homebound (i.e., Due to illness or injury, pt requires aid of  supportive devices such as crutches, cane, wheelchairs, walkers, the use of special transportation or the assistance of another person to leave their place of residence. There is a normal inability to leave the home and doing so requires considerable and taxing  effort. Other absences are for medical reasons / religious services and are infrequent or of short duration when for other reasons). If current dressing causes regression in wound condition, may D/C ordered dressing product/s and apply Normal Saline Moist Dressing daily until next [place] / Other MD appointment. Notify Wound KOREN, IAQUINTO ([id number]) [place] of regression in wound condition at [phone]. Please direct any NON-WOUND related issues/requests for orders to patient's Primary Care Physician Wound #5R Right,Lateral Forearm: [place] to visit patient Monday, Wednesday and Friday week of 04/14/2015 and Monday, Wednesday week of 04/21/2015 Home Health Nurse may visit PRN to address patient s wound care needs. FACE TO FACE ENCOUNTER: MEDICARE and MEDICAID PATIENTS: I certify that this patient is under my care and that I had a face-to-face encounter that meets the physician face-to-face encounter requirements with this patient on this date. The encounter with the patient was in whole or in part for the following MEDICAL CONDITION: (primary reason for [place]) MEDICAL NECESSITY: I certify, that based on my findings, NURSING services are a medically necessary home health service. HOME BOUND STATUS: I certify that my clinical findings support that this patient is homebound (i.e., Due to illness or injury, pt requires aid of supportive devices such as crutches, cane, wheelchairs, walkers, the use of special transportation or the assistance of another person to leave their place of residence. There is a normal inability to leave the home and doing so requires considerable and taxing effort. Other absences are for medical reasons / religious services and are infrequent or of short duration when for other reasons). If current dressing causes regression in wound condition, may D/C ordered dressing product/s and apply Normal  Saline Moist Dressing daily until next [place] / Other MD appointment. [place] of regression in wound condition at [phone]. Please direct any NON-WOUND related issues/requests for orders to patient's Primary Care Physician On his right arm I have recommended a piece of Mepitel light, Kerlix and Coban to be left intact for the week. On his left medial ankle we will use Hydrofera Blue, Kerlix and Coban to reduce his edema. however I wanted to change over to RTD but his home health is unable to procure this and we will continue to use Hydrofera Blue and change it 3 times a week. We are awaiting his official vascular studies [place] and he is going to have a procedure in early January. He may need another round of a skin substitute possibly Theraskin. application. This decision will be delayed until we know his vascular status and I anticipated to be mid January before we take this decision. Electronic Signature(s) Signed: 04/10/2015 11:14:09 AM By: Christin Fudge MD, FACS Entered By: Christin Fudge on 04/10/2015 11:14:09 [name] ([id number]) -------------------------------------------------------------------------------- SuperBill Details Patient Name: TIZIANO, PRINGLE. Date of Service: 04/10/2015 Medical Record Number: [id number] Patient Account Number: [id number] Date of Birth/Sex: 1919/01/19 (81 y.o. 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 I82.401 Acute embolism and thrombosis of unspecified  deep veins of right lower extremity Z92.21 Personal history of antineoplastic chemotherapy L97.522 Non-pressure chronic ulcer of other part of left foot with fat layer  exposed S41.111A Laceration without foreign body of right upper arm, initial encounter Facility Procedures The patient participates with Medicare or their insurance follows the Medicare Facility Guidelines: CPT4 Code Description Modifier Quantity [id number] [place] VISIT-LEV 4 EST PT 1 Physician Procedures CPT4: Description Modifier Quantity Code [id number] - WC PHYS LEVEL 3 - EST PT 1 ICD-10 Description Diagnosis I70.232 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 I82.401 Acute embolism and thrombosis of unspecified deep  veins of right lower extremity Z92.21 Personal history of antineoplastic chemotherapy L97.522 Non-pressure chronic ulcer of other part of left foot with fat layer exposed Electronic Signature(s) Signed: 04/10/2015 12:17:52 PM By: Montey Hora Signed: 04/10/2015 5:05:23 PM By: Christin Fudge MD, FACS Previous Signature: 04/10/2015 11:14:23 AM Version By: Christin Fudge MD, FACS Entered By: Montey Hora on 04/10/2015 12:17:51

## 2015-04-11 NOTE — Progress Notes (Signed)
Philip [name], Philip [name] ([id number]) Visit Report for 04/10/2015 Arrival Information Details Patient Name: Philip [name], Philip [name]. Date of Service: 04/10/2015 10:00 AM Medical Record Number: [id number] Patient Account Number: [id number] Date of Birth/Sex: March 01, 1919 (81 y.o. Male) Treating RN: Montey Hora Primary Care Physician: Hortencia Pilar Other Clinician: Referring Physician: Hortencia Pilar Treating Physician/Extender: Frann Rider in Treatment: 95 Visit Information History Since Last Visit Added or deleted any medications: No Patient Arrived: Walker Any new allergies or adverse reactions: No Arrival Time: 10:13 Had a fall or experienced change in No Accompanied By: son activities of daily living that may affect Transfer Assistance: None risk of falls: Patient Identification Verified: Yes Signs or symptoms of abuse/neglect since last No Secondary Verification Process Yes visito Completed: Hospitalized since last visit: No Patient Requires Transmission- No Pain Present Now: No Based Precautions: Patient Has Alerts: Yes Patient Alerts: Patient on Blood Thinner No ABIs dt LE blood clots Electronic Signature(s) Signed: 04/10/2015 5:45:44 PM By: Montey Hora Entered By: Montey Hora on 04/10/2015 10:13:54 Philip [name] ([id number]) -------------------------------------------------------------------------------- Clinic Level of Care Assessment Details Patient Name: Philip [name]. Date of Service: 04/10/2015 10:00 AM Medical Record Number: [id number] Patient Account Number: [id number] Date of Birth/Sex: [birth year] (81 y.o. Male) Treating RN: Montey Hora Primary Care Physician: Hortencia Pilar Other Clinician: Referring Physician: Hortencia Pilar Treating Physician/Extender: Frann Rider in Treatment: 36 Clinic Level of Care Assessment Items TOOL 4 Quantity Score []  - Use when only an EandM is performed on FOLLOW-UP visit 0 ASSESSMENTS - Nursing Assessment  / Reassessment X - Reassessment of Co-morbidities (includes updates in patient status) 1 10 X - Reassessment of Adherence to Treatment Plan 1 5 ASSESSMENTS - Wound and Skin Assessment / Reassessment []  - Simple Wound Assessment / Reassessment - one wound 0 X - Complex Wound Assessment / Reassessment - multiple wounds 2 5 []  - Dermatologic / Skin Assessment (not related to wound area) 0 ASSESSMENTS - Focused Assessment X - Circumferential Edema Measurements - multi extremities 1 5 []  - Nutritional Assessment / Counseling / Intervention 0 X - Lower Extremity Assessment (monofilament, tuning fork, pulses) 1 5 []  - Peripheral Arterial Disease Assessment (using hand held doppler) 0 ASSESSMENTS - Ostomy and/or Continence Assessment and Care []  - Incontinence Assessment and Management 0 []  - Ostomy Care Assessment and Management (repouching, etc.) 0 PROCESS - Coordination of Care X - Simple Patient / Family Education for ongoing care 1 15 []  - Complex (extensive) Patient / Family Education for ongoing care 0 []  - Staff obtains Programmer, systems, Records, Test Results / Process Orders 0 []  - Staff telephones HHA, Nursing Homes / Clarify orders / etc 0 []  - Routine Transfer to another Facility (non-emergent condition) 0 Philip [name], Philip [name] ([id number]) []  - Routine Hospital Admission (non-emergent condition) 0 []  - New Admissions / Biomedical engineer / Ordering NPWT, Apligraf, etc. 0 []  - Emergency Hospital Admission (emergent condition) 0 X - Simple Discharge Coordination 1 10 []  - Complex (extensive) Discharge Coordination 0 PROCESS - Special Needs []  - Pediatric / Minor Patient Management 0 []  - Isolation Patient Management 0 []  - Hearing / Language / Visual special needs 0 []  - Assessment of Community assistance (transportation, D/C planning, etc.) 0 []  - Additional assistance / Altered mentation 0 []  - Support Surface(s) Assessment (bed, cushion, seat, etc.) 0 INTERVENTIONS - Wound Cleansing /  Measurement []  - Simple Wound Cleansing - one wound 0 X - Complex Wound Cleansing - multiple wounds 2 5 X - Wound Imaging (photographs - any  number of wounds) 1 5 []  - Wound Tracing (instead of photographs) 0 []  - Simple Wound Measurement - one wound 0 X - Complex Wound Measurement - multiple wounds 2 5 INTERVENTIONS - Wound Dressings []  - Small Wound Dressing one or multiple wounds 0 X - Medium Wound Dressing one or multiple wounds 2 15 []  - Large Wound Dressing one or multiple wounds 0 []  - Application of Medications - topical 0 []  - Application of Medications - injection 0 INTERVENTIONS - Miscellaneous []  - External ear exam 0 Philip [name], Philip [name] ([id number]) []  - Specimen Collection (cultures, biopsies, blood, body fluids, etc.) 0 []  - Specimen(s) / Culture(s) sent or taken to Lab for analysis 0 []  - Patient Transfer (multiple staff / Harrel Lemon Lift / Similar devices) 0 []  - Simple Staple / Suture removal (25 or less) 0 []  - Complex Staple / Suture removal (26 or more) 0 []  - Hypo / Hyperglycemic Management (close monitor of Blood Glucose) 0 []  - Ankle / Brachial Index (ABI) - do not check if billed separately 0 X - Vital Signs 1 5 Has the patient been seen at the hospital within the last three years: Yes Total Score: 120 Level Of Care: New/Established - Level 4 Electronic Signature(s) Signed: 04/10/2015 12:17:41 PM By: Montey Hora Entered By: Montey Hora on 04/10/2015 12:17:40 Philip [name] ([id number]) -------------------------------------------------------------------------------- Encounter Discharge Information Details Patient Name: Philip [name], Philip [name]. Date of Service: 04/10/2015 10:00 AM Medical Record Number: [id number] Patient Account Number: [id number] Date of Birth/Sex: 1918/09/29 (81 y.o. Male) Treating RN: Montey Hora Primary Care Physician: Hortencia Pilar Other Clinician: Referring Physician: Hortencia Pilar Treating Physician/Extender: Frann Rider in  Treatment: 15 Encounter Discharge Information Items Discharge Pain Level: 0 Discharge Condition: Stable Ambulatory Status: Walker Discharge Destination: Home Transportation: Private Auto Accompanied By: son Schedule Follow-up Appointment: Yes Medication Reconciliation completed and provided to Patient/Care No Markia Kyer: Provided on Clinical Summary of Care: 04/10/2015 Form Type Recipient Paper Patient GT Electronic Signature(s) Signed: 04/10/2015 12:20:05 PM By: Montey Hora Previous Signature: 04/10/2015 11:13:43 AM Version By: Ruthine Dose Entered By: Montey Hora on 04/10/2015 12:20:05 Philip [name] ([id number]) -------------------------------------------------------------------------------- Lower Extremity Assessment Details Patient Name: Philip [name], Philip [name]. Date of Service: 04/10/2015 10:00 AM Medical Record Number: [id number] Patient Account Number: [id number] Date of Birth/Sex: Aug 25, 1918 (80 y.o. Male) Treating RN: Montey Hora Primary Care Physician: Hortencia Pilar Other Clinician: Referring Physician: Hortencia Pilar Treating Physician/Extender: Frann Rider in Treatment: 25 Edema Assessment Assessed: [Left: No] [Right: No] E[Left: dema] [Right: :] Calf Left: Right: Point of Measurement: 38 cm From Medial Instep 33.7 cm cm Ankle Left: Right: Point of Measurement: 9 cm From Medial Instep 23.9 cm cm Vascular Assessment Pulses: Posterior Tibial Dorsalis Pedis Palpable: [Left:No] Doppler: [Left:Multiphasic] Extremity colors, hair growth, and conditions: Extremity Color: [Left:Hyperpigmented] Hair Growth on Extremity: [Left:No] Temperature of Extremity: [Left:Warm] Capillary Refill: [Left:< 3 seconds] Electronic Signature(s) Signed: 04/10/2015 5:45:44 PM By: Montey Hora Entered By: Montey Hora on 04/10/2015 10:31:38 Philip [name] ([id number]) -------------------------------------------------------------------------------- Multi Wound  Chart Details Patient Name: Philip [name]. Date of Service: 04/10/2015 10:00 AM Medical Record Number: [id number] Patient Account Number: [id number] Date of Birth/Sex: Oct 25, 1918 (81 y.o. 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] [5R:Skin Tear] [N/A:N/A] Comorbid History: [4:Cataracts, Arrhythmia, Congestive Heart Failure] [5R:Cataracts, Arrhythmia, Congestive Heart Failure] [N/A:N/A] Date Acquired: [4:09/20/2014] [5R:09/22/2014] [N/A:N/A] Weeks of Treatment: [4:28] [5R:26] [N/A:N/A] Wound Status: [4:Open] [5R:Open] [N/A:N/A] Wound Recurrence: [4:No] [5R:Yes] [N/A:N/A] Measurements L x W x D 3.2x2.6x0.3 [5R:5x4.3x0.1] [N/A:N/A] (cm) Area (cm) : [4:6.535] [5R:16.886] [N/A:N/A] Volume (cm) : [4:1.96] [5R:1.689] [N/A:N/A] % Reduction in Area: [4:-4062.40%] [5R:-168.80%] [N/A:N/A] % Reduction in Volume: -12150.00% [5R:-168.90%] [N/A:N/A] Classification: [4:Full Thickness With Exposed Support Structures] [5R:Partial Thickness] [N/A:N/A] Exudate Amount: [4:Large] [5R:Large] [N/A:N/A] Exudate Type: [4:Serous] [5R:Serosanguineous] [N/A:N/A] Exudate Color: [4:amber] [5R:red, brown] [N/A:N/A] Wound Margin: [4:Distinct, outline attached] [5R:Flat and Intact] [N/A:N/A] Granulation Amount: [4:Medium (34-66%)] [5R:Large (67-100%)] [N/A:N/A] Granulation Quality: [4:Pink] [5R:Red, Pink] [N/A:N/A] Necrotic Amount: [4:Medium (34-66%)] [5R:None  Present (0%)] [N/A:N/A] Exposed Structures: [4:Fascia: No Fat: No Tendon: No Muscle: No] [5R:Fascia: No Fat: No Tendon: No Muscle: No] [N/A:N/A] Joint: No Joint: No Bone: No Bone: No Limited to Skin Limited to Skin Breakdown Breakdown Epithelialization: None Medium (34-66%) N/A Periwound Skin Texture: Edema: Yes Edema: Yes N/A Excoriation: No Induration: No Callus: No Crepitus: No Fluctuance: No Friable: No Rash: No Scarring: No Periwound Skin Maceration: Yes Moist: Yes N/A Moisture: Moist: Yes Maceration: No Dry/Scaly: No Periwound Skin Color: Erythema: Yes Atrophie Blanche: No N/A Hemosiderin Staining: Yes Cyanosis: No Ecchymosis: No Erythema: No Hemosiderin Staining: No Mottled: No Pallor: No Rubor: No Erythema Location: Circumferential N/A N/A Temperature: No Abnormality No Abnormality N/A Tenderness on Yes No N/A Palpation: Wound Preparation: Ulcer Cleansing: Other: Ulcer Cleansing: Other: N/A soap and water soap and water Topical Anesthetic Topical Anesthetic Applied: Other: lidocaine Applied: None 4% Treatment Notes Electronic Signature(s) Signed: 04/10/2015 5:45:44 PM By: Montey Hora Entered By: Montey Hora on 04/10/2015 10:48:47 Philip [name] ([id number]) -------------------------------------------------------------------------------- [place] Details Patient Name: Philip [name], Philip [name]. Date of Service: 04/10/2015 10:00 AM Medical Record Number: [id number] Patient Account Number: [id number] Date of Birth/Sex: 1918/12/13 (81 y.o. Male) Treating RN: Montey Hora Primary Care Physician: Hortencia Pilar Other Clinician: Referring Physician: Hortencia Pilar Treating Physician/Extender: Frann Rider in Treatment: 68 Active Inactive Abuse / Safety / Falls / Self Care Management Nursing Diagnoses: Abuse or neglect; actual or potential Potential for falls Goals: Patient will remain injury free Date Initiated:  05/02/2014 Goal Status: Active Interventions: Assess fall risk on admission and as needed Notes: Necrotic Tissue Nursing Diagnoses: Impaired tissue integrity related to necrotic/devitalized tissue Goals: Necrotic/devitalized tissue will be minimized in the wound bed Date Initiated: 05/02/2014 Goal Status: Active Interventions: Assess patient pain level pre-, during and post procedure and prior to discharge Treatment Activities: Apply topical anesthetic as ordered : 04/10/2015 Notes: Orientation to the Wound Care Program Nursing Diagnoses: Knowledge deficit related to the wound healing center program Philip [name], Philip [name] ([id number]) Goals: Patient/caregiver will verbalize understanding of the [facility] Program Date Initiated: 05/02/2014 Goal Status: Active Interventions: Provide education on orientation to the wound center Notes: Electronic Signature(s) Signed: 04/10/2015 5:45:44 PM By: Montey Hora Entered By: Montey Hora on 04/10/2015 10:48:34 Philip [name] ([id number]) -------------------------------------------------------------------------------- Patient/Caregiver Education Details Patient Name: Philip [name]. Date of Service: 04/10/2015 10:00 AM Medical Record Number: [id number] Patient Account Number: [id number] Date of Birth/Gender: 1919-03-23 (81 y.o. Male) Treating RN: Montey Hora Primary Care Physician: Hortencia Pilar Other Clinician: Referring Physician: Hortencia Pilar Treating Physician/Extender: Frann Rider in Treatment: 48 Education Assessment Education Provided To: Patient and Caregiver Education Topics Provided Wound/Skin Impairment: Handouts:  Other: wound care as ordered Methods: Demonstration, Explain/Verbal Responses: State content correctly Electronic Signature(s) Signed: 04/10/2015 12:20:24 PM By: Montey Hora Entered By: Montey Hora on 04/10/2015 12:20:24 Philip [name]  ([id number]) -------------------------------------------------------------------------------- Wound Assessment Details Patient Name: Philip [name], Philip [name]. Date of Service: 04/10/2015 10:00 AM Medical Record Number: [id number] Patient Account Number: [id number] Date of Birth/Sex: 09-16-1918 (81 y.o. Male) Treating RN: Montey Hora Primary Care Physician: Hortencia Pilar Other Clinician: Referring Physician: Hortencia Pilar Treating Physician/Extender: Frann Rider in Treatment: 13 Wound Status Wound Number: 4 Primary Malignant Wound Etiology: Wound Location: Left Malleolus - Medial Wound Status: Open Wounding Event: Other Lesion Comorbid Cataracts, Arrhythmia, Congestive Date Acquired: 09/20/2014 History: Heart Failure Weeks Of Treatment: 28 Clustered Wound: No Photos Photo Uploaded By: Montey Hora on 04/10/2015 17:22:06 Wound Measurements Length: (cm) 3.2 Width: (cm) 2.6 Depth: (cm) 0.3 Area: (cm) 6.535 Volume: (cm) 1.96 % Reduction in Area: -4062.4% % Reduction in Volume: -12150% Epithelialization: None Tunneling: No Undermining: No Wound Description Full Thickness With Exposed Classification: Support Structures Wound Margin: Distinct, outline attached Exudate Large Amount: Exudate Type: Serous Exudate Color: amber Foul Odor After Cleansing: No Wound Bed Granulation Amount: Medium (34-66%) Exposed Structure Granulation Quality: Pink Fascia Exposed: No Necrotic Amount: Medium (34-66%) Fat Layer Exposed: No DEMETRIAS, TWOREK ([id number]) Necrotic Quality: Adherent Slough Tendon Exposed: No Muscle Exposed: No Joint Exposed: No Bone Exposed: No Limited to Skin Breakdown Periwound Skin Texture Texture Color No Abnormalities Noted: No No Abnormalities Noted: No Localized Edema: Yes Erythema: Yes Erythema Location: Circumferential Moisture Hemosiderin Staining: Yes No Abnormalities Noted: No Maceration: Yes Temperature / Pain Moist:  Yes Temperature: No Abnormality Tenderness on Palpation: Yes Wound Preparation Ulcer Cleansing: Other: soap and water, Topical Anesthetic Applied: Other: lidocaine 4%, Treatment Notes Wound #4 (Left, Medial Malleolus) 1. Cleansed with: Cleanse wound with antibacterial soap and water 2. Anesthetic Topical Lidocaine 4% cream to wound bed prior to debridement 4. Dressing Applied: Hydrafera Blue 5. Secondary Dressing Applied ABD and Kerlix/Conform Notes kerlix, coban Electronic Signature(s) Signed: 04/10/2015 5:45:44 PM By: Montey Hora Entered By: Montey Hora on 04/10/2015 10:47:04 Philip [name] ([id number]) -------------------------------------------------------------------------------- Wound Assessment Details Patient Name: Philip [name], Philip [name]. Date of Service: 04/10/2015 10:00 AM Medical Record Number: [id number] Patient Account Number: [id number] Date of Birth/Sex: 09/03/1918 (81 y.o. Male) Treating RN: Montey Hora Primary Care Physician: Hortencia Pilar Other Clinician: Referring Physician: Hortencia Pilar Treating Physician/Extender: Frann Rider in Treatment: 56 Wound Status Wound Number: 5R Primary Skin Tear Etiology: Wound Location: Right Forearm - Lateral Wound Status: Open Wounding Event: Shear/Friction Comorbid Cataracts, Arrhythmia, Congestive Date Acquired: 09/22/2014 History: Heart Failure Weeks Of Treatment: 26 Clustered Wound: No Photos Photo Uploaded By: Montey Hora on 04/10/2015 17:21:42 Wound Measurements Length: (cm) 5 Width: (cm) 4.3 Depth: (cm) 0.1 Area: (cm) 16.886 Volume: (cm) 1.689 % Reduction in Area: -168.8% % Reduction in Volume: -168.9% Epithelialization: Medium (34-66%) Tunneling: No Undermining: No Wound Description Classification: Partial Thickness Wound Margin: Flat and Intact Exudate Amount: Large Exudate Type: Serosanguineous Exudate Color: red, brown Foul Odor After Cleansing: No Wound Bed Granulation  Amount: Large (67-100%) Exposed Structure Granulation Quality: Red, Pink Fascia Exposed: No Necrotic Amount: None Present (0%) Fat Layer Exposed: No Tendon Exposed: No Philip [name], Philip [name]. ([id number]) Muscle Exposed: No Joint Exposed: No Bone Exposed: No Limited to Skin Breakdown Periwound Skin Texture Texture Color No Abnormalities Noted: No No Abnormalities Noted: No Callus: No Atrophie Blanche: No Crepitus: No Cyanosis: No Excoriation: No Ecchymosis: No Fluctuance: No Erythema: No Friable: No Hemosiderin  Staining: No Induration: No Mottled: No Localized Edema: Yes Pallor: No Rash: No Rubor: No Scarring: No Temperature / Pain Moisture Temperature: No Abnormality No Abnormalities Noted: No Dry / Scaly: No Maceration: No Moist: Yes Wound Preparation Ulcer Cleansing: Other: soap and water, Topical Anesthetic Applied: None Treatment Notes Wound #5R (Right, Lateral Forearm) 1. Cleansed with: Cleanse wound with antibacterial soap and water 3. Peri-wound Care: Skin Prep 4. Dressing Applied: Other dressing (specify in notes) Notes mepilex lite, conform, coban Electronic Signature(s) Signed: 04/10/2015 5:45:44 PM By: Montey Hora Entered By: Montey Hora on 04/10/2015 10:47:59 Philip [name] ([id number]) -------------------------------------------------------------------------------- [place] Details Patient Name: Philip [name]. Date of Service: 04/10/2015 10:00 AM Medical Record Number: [id number] Patient Account Number: [id number] Date of Birth/Sex: [birth year] (81 y.o. Male) Treating RN: Montey Hora Primary Care Physician: Hortencia Pilar Other Clinician: Referring Physician: Hortencia Pilar Treating Physician/Extender: Frann Rider in Treatment: 72 Vital Signs Time Taken: 10:16 Temperature (F): 97.7 Height (in): 69 Pulse (bpm): 67 Weight (lbs): 179 Respiratory Rate (breaths/min): 18 Body Mass Index (BMI): 26.4 Blood Pressure (mmHg):  150/45 Reference Range: 80 - 120 mg / dl Electronic Signature(s) Signed: 04/10/2015 5:45:44 PM By: Montey Hora Entered By: Montey Hora on 04/10/2015 10:16:48

## 2015-04-24 ENCOUNTER — Ambulatory Visit
Admission: RE | Admit: 2015-04-24 | Discharge: 2015-04-24 | Disposition: A | Discharge status: Home / Self Care | Payer: Medicare Other | Source: Ambulatory Visit | Attending: Vascular Surgery | Admitting: Vascular Surgery

## 2015-04-24 ENCOUNTER — Encounter: Payer: Self-pay | Admitting: *Deleted

## 2015-04-24 ENCOUNTER — Encounter
Admission: RE | Disposition: A | Discharge status: Home / Self Care | Payer: Self-pay | Source: Ambulatory Visit | Attending: Vascular Surgery

## 2015-04-24 DIAGNOSIS — I82409 Acute embolism and thrombosis of unspecified deep veins of unspecified lower extremity: Secondary | ICD-10-CM

## 2015-04-24 DIAGNOSIS — I70243 Atherosclerosis of native arteries of left leg with ulceration of ankle: Secondary | ICD-10-CM

## 2015-04-24 DIAGNOSIS — Z87891 Personal history of nicotine dependence: Secondary | ICD-10-CM | POA: Insufficient documentation

## 2015-04-24 DIAGNOSIS — Z79899 Other long term (current) drug therapy: Secondary | ICD-10-CM | POA: Insufficient documentation

## 2015-04-24 DIAGNOSIS — Z7982 Long term (current) use of aspirin: Secondary | ICD-10-CM | POA: Insufficient documentation

## 2015-04-24 DIAGNOSIS — Z7901 Long term (current) use of anticoagulants: Secondary | ICD-10-CM | POA: Insufficient documentation

## 2015-04-24 DIAGNOSIS — I509 Heart failure, unspecified: Secondary | ICD-10-CM

## 2015-04-24 HISTORY — DX: Gastro-esophageal reflux disease without esophagitis: K21.9

## 2015-04-24 HISTORY — DX: Peripheral vascular disease, unspecified: I73.9

## 2015-04-24 HISTORY — DX: Acute embolism and thrombosis of unspecified deep veins of unspecified lower extremity: I82.409

## 2015-04-24 HISTORY — DX: Heart failure, unspecified: I50.9

## 2015-04-24 HISTORY — PX: PERIPHERAL VASCULAR CATHETERIZATION: SHX172C

## 2015-04-24 HISTORY — DX: Malignant (primary) neoplasm, unspecified: C80.1

## 2015-04-24 HISTORY — DX: Disease of blood and blood-forming organs, unspecified: D75.9

## 2015-04-24 SURGERY — LOWER EXTREMITY ANGIOGRAPHY
Anesthesia: Moderate Sedation | Laterality: Left

## 2015-04-24 MED ORDER — FENTANYL CITRATE (PF) 100 MCG/2ML IJ SOLN
INTRAMUSCULAR | Status: DC | PRN
  Administered 2015-04-24 (×2): 50 ug via INTRAVENOUS

## 2015-04-24 MED ORDER — SODIUM CHLORIDE 0.9 % IV SOLN
INTRAVENOUS | Status: DC
  Administered 2015-04-24: 13:00:00 via INTRAVENOUS

## 2015-04-24 MED ORDER — LABETALOL HCL 5 MG/ML IV SOLN: 10.0000 mg | INTRAVENOUS | Status: DC | PRN

## 2015-04-24 MED ORDER — OXYCODONE-ACETAMINOPHEN 5-325 MG PO TABS: 1.0000 | ORAL_TABLET | ORAL | Status: DC | PRN

## 2015-04-24 MED ORDER — MIDAZOLAM HCL 5 MG/5ML IJ SOLN
INTRAMUSCULAR | Status: AC
  Filled 2015-04-24: qty 5

## 2015-04-24 MED ORDER — ONDANSETRON HCL 4 MG/2ML IJ SOLN: 4.0000 mg | Freq: Four times a day (QID) | INTRAMUSCULAR | Status: DC | PRN

## 2015-04-24 MED ORDER — HYDROMORPHONE HCL 1 MG/ML IJ SOLN: 0.5000 mg | INTRAMUSCULAR | Status: DC | PRN

## 2015-04-24 MED ORDER — HYDROMORPHONE HCL 1 MG/ML IJ SOLN: 1.0000 mg | Freq: Once | INTRAMUSCULAR | Status: DC

## 2015-04-24 MED ORDER — MIDAZOLAM HCL 2 MG/2ML IJ SOLN
INTRAMUSCULAR | Status: DC | PRN
  Administered 2015-04-24: 1 mg via INTRAVENOUS
  Administered 2015-04-24: 2 mg via INTRAVENOUS

## 2015-04-24 MED ORDER — LIDOCAINE-EPINEPHRINE (PF) 1 %-1:200000 IJ SOLN
INTRAMUSCULAR | Status: AC
  Filled 2015-04-24: qty 30

## 2015-04-24 MED ORDER — FAMOTIDINE 20 MG PO TABS: 40.0000 mg | ORAL_TABLET | ORAL | Status: DC | PRN

## 2015-04-24 MED ORDER — DEXTROSE 5 % IV SOLN
1.5000 g | INTRAVENOUS | Status: AC
  Administered 2015-04-24: 1.5 g via INTRAVENOUS

## 2015-04-24 MED ORDER — SODIUM CHLORIDE 0.9 % IV SOLN: 500.0000 mL | Freq: Once | INTRAVENOUS | Status: DC | PRN

## 2015-04-24 MED ORDER — PHENOL 1.4 % MT LIQD: 1.0000 | OROMUCOSAL | Status: DC | PRN

## 2015-04-24 MED ORDER — HYDRALAZINE HCL 20 MG/ML IJ SOLN
5.0000 mg | INTRAMUSCULAR | Status: DC | PRN
Start: 2015-04-24 — End: 2015-04-24

## 2015-04-24 MED ORDER — SODIUM BICARBONATE 8.4 % IV SOLN
INTRAVENOUS | Status: DC
  Filled 2015-04-24: qty 1000

## 2015-04-24 MED ORDER — GUAIFENESIN-DM 100-10 MG/5ML PO SYRP
15.0000 mL | ORAL_SOLUTION | ORAL | Status: DC | PRN
Start: 2015-04-24 — End: 2015-04-24

## 2015-04-24 MED ORDER — CEFUROXIME SODIUM 1.5 G IJ SOLR
INTRAMUSCULAR | Status: AC
  Filled 2015-04-24 (×12): qty 1.5

## 2015-04-24 MED ORDER — METOPROLOL TARTRATE 1 MG/ML IV SOLN: 2.0000 mg | INTRAVENOUS | Status: DC | PRN

## 2015-04-24 MED ORDER — HEPARIN SODIUM (PORCINE) 1000 UNIT/ML IJ SOLN
INTRAMUSCULAR | Status: AC
  Filled 2015-04-24: qty 1

## 2015-04-24 MED ORDER — ACETAMINOPHEN 325 MG RE SUPP: 325.0000 mg | RECTAL | Status: DC | PRN

## 2015-04-24 MED ORDER — FENTANYL CITRATE (PF) 100 MCG/2ML IJ SOLN
INTRAMUSCULAR | Status: AC
  Filled 2015-04-24: qty 2

## 2015-04-24 MED ORDER — ACETAMINOPHEN 325 MG PO TABS
ORAL_TABLET | ORAL | Status: AC
  Administered 2015-04-24: 650 mg via ORAL
  Filled 2015-04-24: qty 2

## 2015-04-24 MED ORDER — LIDOCAINE-EPINEPHRINE (PF) 1 %-1:200000 IJ SOLN
INTRAMUSCULAR | Status: DC | PRN
  Administered 2015-04-24: 10 mL via INTRADERMAL

## 2015-04-24 MED ORDER — SODIUM BICARBONATE BOLUS VIA INFUSION
INTRAVENOUS | Status: AC
  Administered 2015-04-24: 10:00:00 via INTRAVENOUS
  Filled 2015-04-24: qty 1

## 2015-04-24 MED ORDER — HEPARIN (PORCINE) IN NACL 2-0.9 UNIT/ML-% IJ SOLN
INTRAMUSCULAR | Status: AC
  Filled 2015-04-24: qty 1000

## 2015-04-24 MED ORDER — ACETAMINOPHEN 325 MG PO TABS
325.0000 mg | ORAL_TABLET | ORAL | Status: DC | PRN
  Administered 2015-04-24: 650 mg via ORAL

## 2015-04-24 MED ORDER — IOHEXOL 300 MG/ML  SOLN
INTRAMUSCULAR | Status: DC | PRN
  Administered 2015-04-24: 60 mL via INTRA_ARTERIAL

## 2015-04-24 MED ORDER — METHYLPREDNISOLONE SODIUM SUCC 125 MG IJ SOLR: 125.0000 mg | INTRAMUSCULAR | Status: DC | PRN

## 2015-04-24 MED ORDER — CLINDAMYCIN PHOSPHATE 300 MG/50ML IV SOLN
INTRAVENOUS | Status: AC
  Filled 2015-04-24: qty 50

## 2015-04-24 MED ORDER — HEPARIN SODIUM (PORCINE) 1000 UNIT/ML IJ SOLN
INTRAMUSCULAR | Status: DC | PRN
  Administered 2015-04-24: 4000 [IU] via INTRAVENOUS

## 2015-04-24 SURGICAL SUPPLY — 18 items
BALLN LUTONIX 5X150X130 (BALLOONS) ×3
BALLN ULTRVRSE 2.5X300X150 (BALLOONS) ×3
BALLN ULTRVRSE 3.5X100X150 (BALLOONS) ×3
BALLOON LUTONIX 5X150X130 (BALLOONS) ×1 IMPLANT
BALLOON ULTRVRSE 2.5X300X150 (BALLOONS) ×1 IMPLANT
BALLOON ULTRVRSE 3.5X100X150 (BALLOONS) ×1 IMPLANT
CATH PIG 70CM (CATHETERS) ×3 IMPLANT
CATH VERT 100CM (CATHETERS) ×3 IMPLANT
DEVICE PRESTO INFLATION (MISCELLANEOUS) ×3 IMPLANT
DEVICE STARCLOSE SE CLOSURE (Vascular Products) ×3 IMPLANT
GLIDEWIRE ADV .035X260CM (WIRE) ×3 IMPLANT
PACK ANGIOGRAPHY (CUSTOM PROCEDURE TRAY) ×3 IMPLANT
SHEATH BRITE TIP 5FRX11 (SHEATH) ×3 IMPLANT
SHEATH RAABE 6FRX70 (SHEATH) ×3 IMPLANT
SYR MEDRAD MARK V 150ML (SYRINGE) ×3 IMPLANT
TUBING CONTRAST HIGH PRESS 72 (TUBING) ×3 IMPLANT
WIRE G V18X300CM (WIRE) ×3 IMPLANT
WIRE J 3MM .035X145CM (WIRE) ×3 IMPLANT

## 2015-04-24 NOTE — H&P (Signed)
  [facility] History & Physical Update  The patient was interviewed and re-examined.  The patient's previous History and Physical has been reviewed and is unchanged.  There is no change in the plan of care. We plan to proceed with the scheduled procedure.  Stefanny Pieri, MD  04/24/2015, 9:10 AM

## 2015-04-24 NOTE — OR Nursing (Signed)
Daughter requested to make own follow up appt, for father, she has consult calender at home.

## 2015-04-24 NOTE — Discharge Instructions (Signed)
Angiogram, Care After °Refer to this sheet in the next few weeks. These instructions provide you with information about caring for yourself after your procedure. Your health care provider may also give you more specific instructions. Your treatment has been planned according to current medical practices, but problems sometimes occur. Call your health care provider if you have any problems or questions after your procedure. °WHAT TO EXPECT AFTER THE PROCEDURE °After your procedure, it is typical to have the following: °· Bruising at the catheter insertion site that usually fades within 1-2 weeks. °· Blood collecting in the tissue (hematoma) that may be painful to the touch. It should usually decrease in size and tenderness within 1-2 weeks. °HOME CARE INSTRUCTIONS °· Take medicines only as directed by your health care provider. °· You may shower 24-48 hours after the procedure or as directed by your health care provider. Remove the bandage (dressing) and gently wash the site with plain soap and water. Pat the area dry with a clean towel. Do not rub the site, because this may cause bleeding. °· Do not take baths, swim, or use a hot tub until your health care provider approves. °· Check your insertion site every day for redness, swelling, or drainage. °· Do not apply powder or lotion to the site. °· Do not lift over 10 lb (4.5 kg) for 5 days after your procedure or as directed by your health care provider. °· Ask your health care provider when it is okay to: °¨ Return to work or school. °¨ Resume usual physical activities or sports. °¨ Resume sexual activity. °· Do not drive home if you are discharged the same day as the procedure. Have someone else drive you. °· You may drive 24 hours after the procedure unless otherwise instructed by your health care provider. °· Do not operate machinery or power tools for 24 hours after the procedure or as directed by your health care provider. °· If your procedure was done as an  outpatient procedure, which means that you went home the same day as your procedure, a responsible adult should be with you for the first 24 hours after you arrive home. °· Keep all follow-up visits as directed by your health care provider. This is important. °SEEK MEDICAL CARE IF: °· You have a fever. °· You have chills. °· You have increased bleeding from the catheter insertion site. Hold pressure on the site. °SEEK IMMEDIATE MEDICAL CARE IF: °· You have unusual pain at the catheter insertion site. °· You have redness, warmth, or swelling at the catheter insertion site. °· You have drainage (other than a small amount of blood on the dressing) from the catheter insertion site. °· The catheter insertion site is bleeding, and the bleeding does not stop after 30 minutes of holding steady pressure on the site. °· The area near or just beyond the catheter insertion site becomes pale, cool, tingly, or numb. °  °This information is not intended to replace advice given to you by your health care provider. Make sure you discuss any questions you have with your health care provider. °  °Document Released: 10/22/2004 Document Revised: 04/26/2014 Document Reviewed: 09/06/2012 °[employer]vier Interactive Patient Education ©2016 [employer] ° °

## 2015-04-24 NOTE — Op Note (Signed)
[facility] Percutaneous Study/Intervention Procedural Note   Date of Surgery: 04/24/2015  Surgeon(s):DEW,JASON   Assistants:none  Pre-operative Diagnosis: PAD with ulceration LLE  Post-operative diagnosis: Same  Procedure(s) Performed: 1. Ultrasound guidance for vascular access right femoral artery 2. Catheter placement into left peroneal artery from right femoral approach 3. Aortogram and selective left lower extremity angiogram 4. Percutaneous transluminal angioplasty of left peroneal artery with 2.5 mm diameter angioplasty balloon 5. Percutaneous transluminal angioplasty of left popliteal artery with 5 mm diameter Lutonix drug-coated angioplasty balloon  6.  Percutaneous transluminal angioplasty of tibioperoneal trunk and proximal peroneal artery with 3.5 mm diameter angioplasty balloon 7. StarClose closure device right femoral artery  EBL: Minimal  Contrast: 60 cc  Fluoro time: 4.5 minutes  Moderate conscious sedation time: 45 minutes  Indications: Patient is a 82 year old male with a nonhealing ulceration of the left lower extremity. The patient has noninvasive study showing popliteal artery stenosis or occlusion and severe tibial disease. The patient is brought in for angiography for further evaluation and potential treatment. Risks and benefits are discussed and informed consent is obtained  Procedure: The patient was identified and appropriate procedural time out was performed. The patient was then placed supine on the table and prepped and draped in the usual sterile fashion. Ultrasound was used to evaluate the right common femoral artery. It was patent . A digital ultrasound image was acquired. A Seldinger needle was used to access the right common femoral artery under direct ultrasound guidance and a permanent image was performed. A 0.035 J wire was  advanced without resistance and a 5Fr sheath was placed. Pigtail catheter was placed into the aorta and an AP aortogram was performed. This demonstrated normal renal arteries and normal aorta and iliac segments without significant stenosis although the contrast traversed very slowly likely due to reduced ejection fraction. I then crossed the aortic bifurcation and advanced to the left femoral head. I had to advance into the mid superficial femoral artery to image the lower leg due to the slow contrast flow. Selective left lower extremity angiogram was then performed. This demonstrated normal common femoral artery and profunda femoris artery. The SFA was patent down to just below Hunter's canal distal above the knee where the artery abruptly occluded and a calcified area of occlusion was seen with essentially no runoff identified distally initially and complete thrombosis and occlusion of the tibial vessels. The patient was systemically heparinized and a 6 French 70 cm sheath was then placed into the mid SFA over the Terumo Advantage wire. I then used a Kumpe catheter and the advantage wire to navigate through the popliteal artery and into the tibioperoneal trunk. I then exchanged for a 0.018 wire and the Kumpe catheter showed selective peroneal artery cannulation and an intraluminal spot. I then proceeded with treatment. I replaced a 0.018 wire and treated the peroneal artery in down to the mid to distal peroneal artery up through the tibioperoneal trunk with a 2.5 x 30 cm length angioplasty balloon. This was inflated to 12 atm for 1 minute. To treat the popliteal artery occlusion a 5 mm diameter by 15 cm length Lutonix drug-coated angioplasty balloon was inflated to 6 atm for 1 minute. Imaging following these treatment showed the popliteal artery now to be patent but there was what appeared to be chronic thrombus and occlusion of the tibioperoneal trunk and proximal peroneal artery. I elected to treat this area  with a 3.5 mm diameter by 15 cm length angioplasty balloon  inflated to 10 atm for 1 minute. Completion angiogram following this showed in-line flow through the peroneal artery down to the ankle with collateral flow into the foot. Less than 30% residual stenosis was identified in the tibioperoneal trunk and peroneal artery and the popliteal artery now appeared widely patent without significant stenosis. I elected to terminate the procedure. The sheath was removed and StarClose closure device was deployed in the left femoral artery with excellent hemostatic result. The patient was taken to the recovery room in stable condition having tolerated the procedure well.  Findings:  Aortogram: Normal renal arteries. Normal aorta and iliac segments. Left Lower Extremity: SFA and common femoral artery without significant stenosis. Popliteal artery occluded. All tibial vessels occluded initially. Able to restore flow with intervention through the peroneal artery distally   Disposition: Patient was taken to the recovery room in stable condition having tolerated the procedure well.  Complications: None  DEW,JASON 04/24/2015 11:21 AM

## 2015-04-24 NOTE — Progress Notes (Signed)
Pt doing well post procedure, hob up, no bleeding nor hematoma at right groin site, discharge teaching done with daughter/pt/son, taking po;'s without difficulty,Dr Dew in to speak with family with questions answered,

## 2015-04-25 ENCOUNTER — Encounter: Payer: Medicare Other | Attending: Surgery | Admitting: Surgery

## 2015-04-25 ENCOUNTER — Encounter
Admission: AD | Disposition: A | Discharge status: Hospice | Payer: Self-pay | Source: Ambulatory Visit | Attending: Vascular Surgery

## 2015-04-25 ENCOUNTER — Inpatient Hospital Stay
Admission: AD | Admit: 2015-04-25 | Discharge: 2015-04-29 | DRG: 271 | Disposition: A | Discharge status: Hospice | Payer: Medicare Other | Source: Ambulatory Visit | Attending: Vascular Surgery | Admitting: Vascular Surgery

## 2015-04-25 ENCOUNTER — Encounter: Payer: Self-pay | Admitting: Vascular Surgery

## 2015-04-25 DIAGNOSIS — K5900 Constipation, unspecified: Secondary | ICD-10-CM | POA: Diagnosis not present

## 2015-04-25 DIAGNOSIS — I743 Embolism and thrombosis of arteries of the lower extremities: Secondary | ICD-10-CM | POA: Diagnosis present

## 2015-04-25 DIAGNOSIS — I480 Paroxysmal atrial fibrillation: Secondary | ICD-10-CM | POA: Diagnosis present

## 2015-04-25 DIAGNOSIS — K219 Gastro-esophageal reflux disease without esophagitis: Secondary | ICD-10-CM | POA: Diagnosis present

## 2015-04-25 DIAGNOSIS — Z7982 Long term (current) use of aspirin: Secondary | ICD-10-CM

## 2015-04-25 DIAGNOSIS — D759 Disease of blood and blood-forming organs, unspecified: Secondary | ICD-10-CM | POA: Diagnosis present

## 2015-04-25 DIAGNOSIS — Z79899 Other long term (current) drug therapy: Secondary | ICD-10-CM | POA: Diagnosis not present

## 2015-04-25 DIAGNOSIS — L97829 Non-pressure chronic ulcer of other part of left lower leg with unspecified severity: Secondary | ICD-10-CM | POA: Diagnosis present

## 2015-04-25 DIAGNOSIS — Z7901 Long term (current) use of anticoagulants: Secondary | ICD-10-CM

## 2015-04-25 DIAGNOSIS — Z86718 Personal history of other venous thrombosis and embolism: Secondary | ICD-10-CM | POA: Diagnosis not present

## 2015-04-25 DIAGNOSIS — I70232 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: Secondary | ICD-10-CM | POA: Insufficient documentation

## 2015-04-25 DIAGNOSIS — I493 Ventricular premature depolarization: Secondary | ICD-10-CM | POA: Diagnosis present

## 2015-04-25 DIAGNOSIS — I509 Heart failure, unspecified: Secondary | ICD-10-CM | POA: Diagnosis present

## 2015-04-25 DIAGNOSIS — I998 Other disorder of circulatory system: Secondary | ICD-10-CM | POA: Diagnosis present

## 2015-04-25 DIAGNOSIS — Z515 Encounter for palliative care: Secondary | ICD-10-CM | POA: Diagnosis present

## 2015-04-25 DIAGNOSIS — S41111A Laceration without foreign body of right upper arm, initial encounter: Secondary | ICD-10-CM | POA: Insufficient documentation

## 2015-04-25 DIAGNOSIS — E872 Acidosis: Secondary | ICD-10-CM | POA: Diagnosis present

## 2015-04-25 DIAGNOSIS — L97522 Non-pressure chronic ulcer of other part of left foot with fat layer exposed: Secondary | ICD-10-CM | POA: Insufficient documentation

## 2015-04-25 DIAGNOSIS — I739 Peripheral vascular disease, unspecified: Secondary | ICD-10-CM | POA: Insufficient documentation

## 2015-04-25 DIAGNOSIS — Z66 Do not resuscitate: Secondary | ICD-10-CM | POA: Diagnosis present

## 2015-04-25 DIAGNOSIS — I491 Atrial premature depolarization: Secondary | ICD-10-CM | POA: Diagnosis present

## 2015-04-25 DIAGNOSIS — Z9221 Personal history of antineoplastic chemotherapy: Secondary | ICD-10-CM | POA: Insufficient documentation

## 2015-04-25 DIAGNOSIS — N189 Chronic kidney disease, unspecified: Secondary | ICD-10-CM | POA: Insufficient documentation

## 2015-04-25 DIAGNOSIS — I70262 Atherosclerosis of native arteries of extremities with gangrene, left leg: Secondary | ICD-10-CM | POA: Diagnosis present

## 2015-04-25 DIAGNOSIS — H9190 Unspecified hearing loss, unspecified ear: Secondary | ICD-10-CM | POA: Diagnosis present

## 2015-04-25 DIAGNOSIS — Z87891 Personal history of nicotine dependence: Secondary | ICD-10-CM

## 2015-04-25 DIAGNOSIS — I82401 Acute embolism and thrombosis of unspecified deep veins of right lower extremity: Secondary | ICD-10-CM | POA: Insufficient documentation

## 2015-04-25 HISTORY — PX: PERIPHERAL VASCULAR CATHETERIZATION: SHX172C

## 2015-04-25 LAB — CBC
HCT: 34.6 % — ABNORMAL LOW (ref 40.0–52.0)
HEMOGLOBIN: 11.8 g/dL — AB (ref 13.0–18.0)
MCH: 31.7 pg (ref 26.0–34.0)
MCHC: 34.2 g/dL (ref 32.0–36.0)
MCV: 92.4 fL (ref 80.0–100.0)
PLATELETS: 419 10*3/uL (ref 150–440)
RBC: 3.74 MIL/uL — AB (ref 4.40–5.90)
RDW: 13 % (ref 11.5–14.5)
WBC: 15.8 10*3/uL — AB (ref 3.8–10.6)

## 2015-04-25 LAB — BASIC METABOLIC PANEL
ANION GAP: 6 (ref 5–15)
BUN: 49 mg/dL — ABNORMAL HIGH (ref 6–20)
CALCIUM: 9 mg/dL (ref 8.9–10.3)
CO2: 24 mmol/L (ref 22–32)
CREATININE: 2.3 mg/dL — AB (ref 0.61–1.24)
Chloride: 106 mmol/L (ref 101–111)
GFR, EST AFRICAN AMERICAN: 26 mL/min — AB (ref >60)
GFR, EST NON AFRICAN AMERICAN: 23 mL/min — AB (ref >60)
Glucose, Bld: 98 mg/dL (ref 65–99)
Potassium: 4.9 mmol/L (ref 3.5–5.1)
SODIUM: 136 mmol/L (ref 135–145)

## 2015-04-25 LAB — GLUCOSE, CAPILLARY: GLUCOSE-CAPILLARY: 151 mg/dL — AB (ref 65–99)

## 2015-04-25 LAB — PROTIME-INR
INR: 1.28
PROTHROMBIN TIME: 16.1 s — AB (ref 11.4–15.0)

## 2015-04-25 LAB — MAGNESIUM: MAGNESIUM: 2.4 mg/dL (ref 1.7–2.4)

## 2015-04-25 LAB — APTT: aPTT: 42 seconds — ABNORMAL HIGH (ref 24–36)

## 2015-04-25 SURGERY — LOWER EXTREMITY ANGIOGRAPHY
Anesthesia: Moderate Sedation | Laterality: Left

## 2015-04-25 SURGERY — ABDOMINAL AORTOGRAM W/LOWER EXTREMITY
Wound class: Clean

## 2015-04-25 MED ORDER — SODIUM CHLORIDE 0.9 % IV SOLN
250.0000 mL | INTRAVENOUS | Status: DC | PRN
  Administered 2015-04-25: 1000 mL via INTRAVENOUS

## 2015-04-25 MED ORDER — FENTANYL CITRATE (PF) 100 MCG/2ML IJ SOLN
INTRAMUSCULAR | Status: DC | PRN
  Administered 2015-04-25 (×2): 25 ug via INTRAVENOUS
  Administered 2015-04-25 (×2): 50 ug via INTRAVENOUS

## 2015-04-25 MED ORDER — LISINOPRIL 5 MG PO TABS
2.5000 mg | ORAL_TABLET | Freq: Every day | ORAL | Status: DC
  Administered 2015-04-25 – 2015-04-29 (×4): 2.5 mg via ORAL
  Filled 2015-04-25 (×5): qty 1

## 2015-04-25 MED ORDER — FENTANYL CITRATE (PF) 100 MCG/2ML IJ SOLN
INTRAMUSCULAR | Status: AC
  Filled 2015-04-25: qty 2

## 2015-04-25 MED ORDER — FUROSEMIDE 40 MG PO TABS
40.0000 mg | ORAL_TABLET | Freq: Every day | ORAL | Status: DC
  Administered 2015-04-25 – 2015-04-29 (×4): 40 mg via ORAL
  Filled 2015-04-25 (×4): qty 1

## 2015-04-25 MED ORDER — MIDAZOLAM HCL 2 MG/2ML IJ SOLN
INTRAMUSCULAR | Status: DC | PRN
  Administered 2015-04-25: 2 mg via INTRAVENOUS
  Administered 2015-04-25 (×3): 1 mg via INTRAVENOUS

## 2015-04-25 MED ORDER — SODIUM CHLORIDE 0.9 % IJ SOLN
3.0000 mL | INTRAMUSCULAR | Status: DC | PRN
  Administered 2015-04-27: 3 mL via INTRAVENOUS
  Filled 2015-04-25: qty 10

## 2015-04-25 MED ORDER — ADULT MULTIVITAMIN W/MINERALS CH
1.0000 | ORAL_TABLET | Freq: Every morning | ORAL | Status: DC
  Administered 2015-04-26 – 2015-04-29 (×4): 1 via ORAL
  Filled 2015-04-25 (×4): qty 1

## 2015-04-25 MED ORDER — TRAZODONE HCL 100 MG PO TABS
100.0000 mg | ORAL_TABLET | Freq: Every day | ORAL | Status: DC
  Administered 2015-04-25 – 2015-04-28 (×4): 100 mg via ORAL
  Filled 2015-04-25 (×4): qty 1

## 2015-04-25 MED ORDER — APIXABAN 2.5 MG PO TABS
2.5000 mg | ORAL_TABLET | Freq: Two times a day (BID) | ORAL | Status: DC
  Administered 2015-04-25 – 2015-04-26 (×2): 2.5 mg via ORAL
  Filled 2015-04-25 (×4): qty 1

## 2015-04-25 MED ORDER — FLEET ENEMA 7-19 GM/118ML RE ENEM: 1.0000 | ENEMA | Freq: Once | RECTAL | Status: DC | PRN

## 2015-04-25 MED ORDER — DEXTROSE-NACL 5-0.9 % IV SOLN
INTRAVENOUS | Status: DC
  Administered 2015-04-26: 01:00:00 via INTRAVENOUS

## 2015-04-25 MED ORDER — CLINDAMYCIN PHOSPHATE 300 MG/50ML IV SOLN
INTRAVENOUS | Status: AC
  Filled 2015-04-25: qty 50

## 2015-04-25 MED ORDER — DEXTROSE 5 % IV SOLN
1.5000 g | INTRAVENOUS | Status: AC
  Administered 2015-04-25: 1.5 g via INTRAVENOUS

## 2015-04-25 MED ORDER — HEPARIN (PORCINE) IN NACL 100-0.45 UNIT/ML-% IJ SOLN
1300.0000 [IU]/h | INTRAMUSCULAR | Status: DC
  Filled 2015-04-25 (×2): qty 250

## 2015-04-25 MED ORDER — TIROFIBAN HCL IN NACL 5-0.9 MG/100ML-% IV SOLN
0.0750 ug/kg/min | INTRAVENOUS | Status: DC
  Administered 2015-04-25 – 2015-04-26 (×2): 0.075 ug/kg/min via INTRAVENOUS
  Filled 2015-04-25 (×2): qty 100

## 2015-04-25 MED ORDER — SODIUM CHLORIDE 0.9 % IJ SOLN
3.0000 mL | Freq: Two times a day (BID) | INTRAMUSCULAR | Status: DC
  Administered 2015-04-26 – 2015-04-29 (×6): 3 mL via INTRAVENOUS

## 2015-04-25 MED ORDER — MIDAZOLAM HCL 5 MG/5ML IJ SOLN
INTRAMUSCULAR | Status: AC
  Filled 2015-04-25: qty 5

## 2015-04-25 MED ORDER — ASPIRIN EC 81 MG PO TBEC
81.0000 mg | DELAYED_RELEASE_TABLET | Freq: Every day | ORAL | Status: DC
  Administered 2015-04-26 – 2015-04-29 (×4): 81 mg via ORAL
  Filled 2015-04-25 (×4): qty 1

## 2015-04-25 MED ORDER — SODIUM CHLORIDE 0.9 % IV SOLN: INTRAVENOUS | Status: DC

## 2015-04-25 MED ORDER — HEPARIN SODIUM (PORCINE) 1000 UNIT/ML IJ SOLN
INTRAMUSCULAR | Status: DC | PRN
  Administered 2015-04-25: 3000 [IU] via INTRAVENOUS

## 2015-04-25 MED ORDER — CLINDAMYCIN PHOSPHATE 300 MG/50ML IV SOLN
300.0000 mg | Freq: Once | INTRAVENOUS | Status: DC
  Filled 2015-04-25: qty 50

## 2015-04-25 MED ORDER — ONDANSETRON HCL 4 MG PO TABS: 4.0000 mg | ORAL_TABLET | Freq: Four times a day (QID) | ORAL | Status: DC | PRN

## 2015-04-25 MED ORDER — DOCUSATE SODIUM 100 MG PO CAPS: 100.0000 mg | ORAL_CAPSULE | Freq: Two times a day (BID) | ORAL | Status: DC

## 2015-04-25 MED ORDER — DULOXETINE HCL 30 MG PO CPEP
30.0000 mg | ORAL_CAPSULE | Freq: Every day | ORAL | Status: DC
  Administered 2015-04-25 – 2015-04-29 (×5): 30 mg via ORAL
  Filled 2015-04-25 (×5): qty 1

## 2015-04-25 MED ORDER — HYDROCODONE-ACETAMINOPHEN 5-325 MG PO TABS
1.0000 | ORAL_TABLET | ORAL | Status: DC | PRN
  Administered 2015-04-27: 2 via ORAL
  Administered 2015-04-27: 1 via ORAL
  Administered 2015-04-28 – 2015-04-29 (×6): 2 via ORAL
  Filled 2015-04-25 (×4): qty 2
  Filled 2015-04-25: qty 1
  Filled 2015-04-25 (×3): qty 2

## 2015-04-25 MED ORDER — HEPARIN (PORCINE) IN NACL 2-0.9 UNIT/ML-% IJ SOLN
INTRAMUSCULAR | Status: AC
  Filled 2015-04-25: qty 1000

## 2015-04-25 MED ORDER — LIDOCAINE-EPINEPHRINE (PF) 1 %-1:200000 IJ SOLN
INTRAMUSCULAR | Status: AC
  Filled 2015-04-25: qty 30

## 2015-04-25 MED ORDER — METOPROLOL SUCCINATE ER 25 MG PO TB24
25.0000 mg | ORAL_TABLET | Freq: Every day | ORAL | Status: DC
Start: 2015-04-25 — End: 2015-04-29
  Administered 2015-04-25 – 2015-04-29 (×4): 25 mg via ORAL
  Filled 2015-04-25 (×4): qty 1

## 2015-04-25 MED ORDER — HEPARIN SODIUM (PORCINE) 1000 UNIT/ML IJ SOLN
INTRAMUSCULAR | Status: AC
  Filled 2015-04-25: qty 1

## 2015-04-25 MED ORDER — FAMOTIDINE 20 MG PO TABS
20.0000 mg | ORAL_TABLET | Freq: Every day | ORAL | Status: DC
  Administered 2015-04-25 – 2015-04-26 (×2): 20 mg via ORAL
  Filled 2015-04-25 (×2): qty 1

## 2015-04-25 MED ORDER — HEPARIN (PORCINE) IN NACL 2-0.9 UNIT/ML-% IJ SOLN
INTRAMUSCULAR | Status: AC
  Filled 2015-04-25: qty 500

## 2015-04-25 MED ORDER — ONDANSETRON HCL 4 MG/2ML IJ SOLN: 4.0000 mg | Freq: Four times a day (QID) | INTRAMUSCULAR | Status: DC | PRN

## 2015-04-25 MED ORDER — ACETAMINOPHEN 650 MG RE SUPP: 650.0000 mg | Freq: Four times a day (QID) | RECTAL | Status: DC | PRN

## 2015-04-25 MED ORDER — SODIUM BICARBONATE BOLUS VIA INFUSION
INTRAVENOUS | Status: AC
  Filled 2015-04-25: qty 1

## 2015-04-25 MED ORDER — ACETAMINOPHEN 325 MG PO TABS: 650.0000 mg | ORAL_TABLET | Freq: Four times a day (QID) | ORAL | Status: DC | PRN

## 2015-04-25 MED ORDER — MAGNESIUM HYDROXIDE 400 MG/5ML PO SUSP: 30.0000 mL | Freq: Every day | ORAL | Status: DC | PRN

## 2015-04-25 MED ORDER — TIROFIBAN HCL IN NACL 5-0.9 MG/100ML-% IV SOLN
INTRAVENOUS | Status: AC
  Filled 2015-04-25: qty 100

## 2015-04-25 MED ORDER — ALUM & MAG HYDROXIDE-SIMETH 200-200-20 MG/5ML PO SUSP: 30.0000 mL | Freq: Four times a day (QID) | ORAL | Status: DC | PRN

## 2015-04-25 MED ORDER — CHLORHEXIDINE GLUCONATE CLOTH 2 % EX PADS: 6.0000 | MEDICATED_PAD | Freq: Once | CUTANEOUS | Status: DC

## 2015-04-25 MED ORDER — MORPHINE SULFATE (PF) 2 MG/ML IV SOLN: 2.0000 mg | INTRAVENOUS | Status: DC | PRN

## 2015-04-25 MED ORDER — SORBITOL 70 % SOLN
30.0000 mL | Freq: Every day | Status: DC | PRN
  Filled 2015-04-25: qty 30

## 2015-04-25 MED ORDER — IOHEXOL 300 MG/ML  SOLN
INTRAMUSCULAR | Status: DC | PRN
  Administered 2015-04-25: 45 mL via INTRA_ARTERIAL

## 2015-04-25 MED ORDER — SODIUM BICARBONATE 8.4 % IV SOLN
INTRAVENOUS | Status: AC
  Administered 2015-04-25: 19:00:00 via INTRAVENOUS
  Filled 2015-04-25: qty 500

## 2015-04-25 MED ORDER — DOCUSATE SODIUM 100 MG PO CAPS
100.0000 mg | ORAL_CAPSULE | Freq: Two times a day (BID) | ORAL | Status: DC
  Administered 2015-04-25 – 2015-04-29 (×8): 100 mg via ORAL
  Filled 2015-04-25 (×8): qty 1

## 2015-04-25 SURGICAL SUPPLY — 26 items
BALLN ULTRVRSE 2.5X220X150 (BALLOONS) ×5
BALLN ULTRVRSE 3X300X150 (BALLOONS) ×2
BALLN ULTRVRSE 3X300X150 OTW (BALLOONS) ×3
BALLN ULTRVRSE 4X220X150 (BALLOONS) ×2
BALLN ULTRVRSE 4X220X150 OTW (BALLOONS) ×3
BALLOON ULTRVRSE 2.5X220X150 (BALLOONS) ×3 IMPLANT
BALLOON ULTRVRSE 3X300X150 OTW (BALLOONS) ×3 IMPLANT
BALLOON ULTRVRSE 4X220X150 OTW (BALLOONS) ×3 IMPLANT
CATH CXI SUPP ANG 4FR 135 (MICROCATHETER) ×3 IMPLANT
CATH CXI SUPP ANG 4FR 135CM (MICROCATHETER) ×5
CATH PIG 70CM (CATHETERS) ×5 IMPLANT
CATH RIM 65CM (CATHETERS) ×5 IMPLANT
DEVICE PRESTO INFLATION (MISCELLANEOUS) ×5 IMPLANT
DEVICE SOLENT OMNI 120CM (CATHETERS) ×5 IMPLANT
DEVICE STARCLOSE SE CLOSURE (Vascular Products) ×5 IMPLANT
DEVICE TORQUE (MISCELLANEOUS) ×5 IMPLANT
GLIDEWIRE ADV .035X260CM (WIRE) ×5 IMPLANT
PACK ANGIOGRAPHY (CUSTOM PROCEDURE TRAY) ×5 IMPLANT
SHEATH BRITE TIP 5FRX11 (SHEATH) ×5 IMPLANT
SHEATH RAABE 6FRX70 (SHEATH) ×5 IMPLANT
STENT VIABAHN 5X150X120 (Permanent Stent) ×5 IMPLANT
SYR MEDRAD MARK V 150ML (SYRINGE) ×5 IMPLANT
TOWEL OR 17X26 4PK STRL BLUE (TOWEL DISPOSABLE) ×5 IMPLANT
TUBING CONTRAST HIGH PRESS 72 (TUBING) ×5 IMPLANT
WIRE G V18X300CM (WIRE) ×5 IMPLANT
WIRE J 3MM .035X145CM (WIRE) ×5 IMPLANT

## 2015-04-25 NOTE — H&P (Signed)
  [facility] History & Physical Update  The patient was interviewed and re-examined.  The patient's previous History and Physical has been reviewed and his leg is now ischemic. See office note for full details.  Admit, angiogram today.  There is no change in the plan of care. We plan to proceed with the scheduled procedure.  DEW,JASON, MD  04/25/2015, 1:21 PM

## 2015-04-25 NOTE — Progress Notes (Signed)
Received update that pt may transfer to CCU.  Tried to call report to CCU for pt currently in OR, however pt transfer order has not been placed by MD yet.  PACU/OR to give update upon transfer. Day shift Agricultural consultant aware and night shift Agricultural consultant aware.

## 2015-04-25 NOTE — Op Note (Signed)
[facility] Percutaneous Study/Intervention Procedural Note   Date of Surgery: 04/25/2015  Surgeon(s):Jammie Troup   Assistants:none  Pre-operative Diagnosis: PAD with rest pain and ulceration LLE, acute on chronic ischemia  Post-operative diagnosis: Same  Procedure(s) Performed: 1. Ultrasound guidance for vascular access right femoral artery 2. Catheter placement into left peroneal artery from right femoral appriach 3. Selective left lower extremity angiogram 4. Catheter directed thrombolysis with 4 mg of TPA to the left popliteal artery tibioperoneal trunk and peroneal artery with the AngioJet Omni catheter 5. Mechanical rheolytic thrombectomy with the AngioJet on the catheter to the left popliteal artery, tibioperoneal trunk, and peroneal artery  6.  Percutaneous transluminal angioplasty of left peroneal artery and tibioperoneal trunk with 2.5 and 3 mm diameter angioplasty balloon  7.  Percutaneous transluminal angioplasty of left popliteal artery with 4 mm diameter angioplasty balloon  8.  Covered stent placement to left popliteal artery for greater than 50% residual stenosis in residual thrombosis after angioplasty using a 5 mm diameter by 15 cm length Viabahn covered stent 9. StarClose closure device right femoral artery  EBL: 25 cc  Contrast: 45 cc  Fluro Time: 6 minutes  Conscious Sedation Time: approximately 45 minutes  Indications: Patient is a 82 y.o.male with acute on chronic ischemia of the left lower extremity after revascularization earlier this week. The patient has noninvasive study showing thrombosis of his previous intervention with no flow from the popliteal artery down. The patient is brought in for angiography for further evaluation and potential treatment. Risks and benefits are discussed and informed consent is obtained  Procedure: The patient  was identified and appropriate procedural time out was performed. The patient was then placed supine on the table and prepped and draped in the usual sterile fashion. Ultrasound was used to evaluate the right common femoral artery. It was patent . A digital ultrasound image was acquired. A Seldinger needle was used to access the right common femoral artery under direct ultrasound guidance and a permanent image was performed. A 0.035 J wire was advanced without resistance and a 5Fr sheath was placed. I then crossed the aortic bifurcation and advanced to the left femoral head and advanced a catheter into the mid to distal superficial femoral artery. Selective left lower extremity angiogram was then performed. This demonstrated occlusion of the popliteal artery just above the knee with no distal reconstitution identified on the initial images. The patient was systemically heparinized and a 6 French 70 cm sheath was then placed over the Terumo Advantage wire. I then used a Kumpe catheter and the advantage wire to navigate through the popliteal occlusion and into the peroneal artery. I placed a CXI catheter to confirm intraluminal flow and there was clearly thrombus within the peroneal artery. I exchanged for a 0.018 wire and began intervention. 4 mg of TPA was deployed and a power pulse spray fashion with the AngioJet on the catheter to the left popliteal artery, tibioperoneal trunk, and peroneal artery down to the mid to distal segment. This was allowed to dwell for 10-15 minutes. Mechanical rheolytic thrombectomy was performed with 2 passes of the AngioJet Omni catheter to the left popliteal artery, tibioperoneal trunk, and peroneal artery with a total of 210 cc of effluent returned. Percutaneous transluminal angioplasty was performed first on the tibioperoneal trunk and peroneal artery. I first used a 2.5 mm diameter by 30 cm length angioplasty balloon inflated to 12 atm throughout the peroneal artery to its  distal segment up through the popliteal artery.  The popliteal artery was then treated with a 4 mm diameter by 22 cm angioplasty balloon. This was inflated to 12 atm. Angiogram following this showed a large chunk of thrombus in the mid popliteal artery with nearly occlusive flow and very little flow getting distally. I treated the popliteal artery with a 5 mm diameter by 15 cm length Viabahn covered stent postdilated with a 4 mm balloon. This resulted in flow to the tibioperoneal trunk but residual occlusion of the peroneal artery and tibioperoneal trunk. I then balloon inflated a 3 mm diameter by 30 cm length angioplasty balloon from the distal peroneal artery up to through the peroneal trunk and into the distal popliteal artery at the level of the distal end of the stent. This was inflated to 10 atm for 1 minute. Completion angiogram showed some residual thrombus within the peroneal artery but there was now flow distally. I did not feel there was anything else I could do to improve his flow at this time and we will keep him on IV anticoagulants in hopes of limb salvage. I elected to terminate the procedure. The sheath was removed and StarClose closure device was deployed in the left femoral artery with excellent hemostatic result. The patient was taken to the recovery room in stable condition having tolerated the procedure well.  Findings:  Left Lower Extremity: Recurrent occlusion of the left popliteal artery and tibial vessels. Flow was restored with intervention but some thrombus remained.   Disposition: Patient was taken to the recovery room in stable condition having tolerated the procedure well.  Complications: None  Leoni Goodness 04/25/2015 6:25 PM

## 2015-04-25 NOTE — Progress Notes (Signed)
Spoke with Dr. Lucky Cowboy concerning that upon first assessment, pedal pulses on the left foot could not be found.  Foot was also cool to the touch.  Dr Lucky Cowboy is aware.

## 2015-04-25 NOTE — Consult Note (Signed)
ANTICOAGULATION CONSULT NOTE - Initial Consult  Pharmacy Consult for heparin Indication: DVT  Allergies  Allergen Reactions  . Penapar-Vk [Penicillin V] Nausea And Vomiting  . Sulfa Antibiotics Nausea And Vomiting    Patient Measurements: Height: 5\' 9"  (175.3 cm) Weight: 168 lb 3 oz (76.289 kg) IBW/kg (Calculated) : 70.7 Heparin Dosing Weight: 76.3kg  Vital Signs: Temp: 97.8 F (36.6 C) (01/06 1350) Temp Source: Oral (01/06 1350) BP: 136/47 mmHg (01/06 1350) Pulse Rate: 41 (01/06 1350)  Labs: No results for input(s): HGB, HCT, PLT, APTT, LABPROT, INR, HEPARINUNFRC, CREATININE, CKTOTAL, CKMB, TROPONINI in the last 72 hours.  CrCl cannot be calculated (Patient has no serum creatinine result on file.).   Medical History: Past Medical History  Diagnosis Date  . Peripheral vascular disease ([place])   . CHF (congestive heart failure) ([place])   . Cancer ([place])   . GERD (gastroesophageal reflux disease)   . Blood dyscrasia   . DVT (deep venous thrombosis) ([facility])     Medications:  Scheduled:  . aspirin EC  81 mg Oral Daily  . clindamycin (CLEOCIN) IV  300 mg Intravenous Once  . docusate sodium  100 mg Oral BID  . sodium chloride  3 mL Intravenous Q12H    Assessment: Pt is a 82 year old male who had a angiography done yesterday was d/c. Pt now presents with ischemic leg. Pharmacy consulted to dose a heparin drip. Per Dr. Lucky Cowboy he would prefer no bolus. Patient takes apixiban at home, but has not had a dose in 4-5 days per Dr. Lucky Cowboy. Basline APTT, PT and CBC were ordered  Goal of Therapy:  Heparin level 0.3-0.7 units/ml Monitor platelets by anticoagulation protocol: Yes   Plan:  Start heparin drip at 1300 units/hr. Recheck heparin level in 8 hours.  Dayna Geurts D Gearldene Fiorenza 04/25/2015,2:36 PM

## 2015-04-25 NOTE — Progress Notes (Signed)
Pt remains clinically stable post procedure, on bicarb gtt along with iv ns fluids for hydration, started aggrastat gtt per orders  With second iv access started, family present with Dr Lucky Cowboy out to speak with family with questions answered, to stay overnight in ccu with blood thinner gtt, report given to [facility] with orders and plan reviewed, no bleeding nor hematoma visible at right groin site with pad device on overnight.

## 2015-04-25 NOTE — Progress Notes (Signed)
eLink Physician-Brief Progress Note Patient Name: MAYSON LONIE DOB: Sep 03, 1918 MRN: [id number]   Date of Service  04/25/2015  HPI/Events of Note  S/p angioplasty to Lt leg.  eICU Interventions  No elink interventions at this time.     Intervention Category Major Interventions: Other:  Sheli Dorin 04/25/2015, 11:01 PM

## 2015-04-26 LAB — TYPE AND SCREEN
ABO/RH(D): O POS
ANTIBODY SCREEN: NEGATIVE

## 2015-04-26 LAB — BASIC METABOLIC PANEL
Anion gap: 8 (ref 5–15)
BUN: 38 mg/dL — AB (ref 6–20)
CHLORIDE: 105 mmol/L (ref 101–111)
CO2: 24 mmol/L (ref 22–32)
CREATININE: 1.97 mg/dL — AB (ref 0.61–1.24)
Calcium: 8.3 mg/dL — ABNORMAL LOW (ref 8.9–10.3)
GFR, EST AFRICAN AMERICAN: 32 mL/min — AB (ref >60)
GFR, EST NON AFRICAN AMERICAN: 27 mL/min — AB (ref >60)
Glucose, Bld: 144 mg/dL — ABNORMAL HIGH (ref 65–99)
POTASSIUM: 4.1 mmol/L (ref 3.5–5.1)
SODIUM: 137 mmol/L (ref 135–145)

## 2015-04-26 LAB — APTT: aPTT: 45 seconds — ABNORMAL HIGH (ref 24–36)

## 2015-04-26 LAB — ABO/RH: ABO/RH(D): O POS

## 2015-04-26 LAB — CBC
HCT: 34.2 % — ABNORMAL LOW (ref 40.0–52.0)
Hemoglobin: 11.2 g/dL — ABNORMAL LOW (ref 13.0–18.0)
MCH: 30.6 pg (ref 26.0–34.0)
MCHC: 32.6 g/dL (ref 32.0–36.0)
MCV: 93.8 fL (ref 80.0–100.0)
PLATELETS: 371 10*3/uL (ref 150–440)
RBC: 3.65 MIL/uL — AB (ref 4.40–5.90)
RDW: 13 % (ref 11.5–14.5)
WBC: 16 10*3/uL — AB (ref 3.8–10.6)

## 2015-04-26 LAB — HEPARIN LEVEL (UNFRACTIONATED): Heparin Unfractionated: 1.94 IU/mL — ABNORMAL HIGH (ref 0.30–0.70)

## 2015-04-26 LAB — MAGNESIUM: MAGNESIUM: 2.3 mg/dL (ref 1.7–2.4)

## 2015-04-26 LAB — MRSA PCR SCREENING: MRSA BY PCR: NEGATIVE

## 2015-04-26 MED ORDER — CETYLPYRIDINIUM CHLORIDE 0.05 % MT LIQD
7.0000 mL | Freq: Two times a day (BID) | OROMUCOSAL | Status: DC
  Administered 2015-04-26 – 2015-04-29 (×6): 7 mL via OROMUCOSAL

## 2015-04-26 MED ORDER — PANTOPRAZOLE SODIUM 40 MG PO TBEC
40.0000 mg | DELAYED_RELEASE_TABLET | Freq: Every day | ORAL | Status: DC
  Administered 2015-04-26 – 2015-04-29 (×4): 40 mg via ORAL
  Filled 2015-04-26 (×4): qty 1

## 2015-04-26 MED ORDER — HEPARIN (PORCINE) IN NACL 100-0.45 UNIT/ML-% IJ SOLN
14.0000 [IU]/kg/h | INTRAMUSCULAR | Status: DC
  Administered 2015-04-26 – 2015-04-28 (×2): 14 [IU]/kg/h via INTRAVENOUS
  Filled 2015-04-26 (×3): qty 250

## 2015-04-26 NOTE — Progress Notes (Signed)
57mL's air removed at 1400 from P.A.D. 49mL's removed from P.A.D. At 1850. Philip [name] E 7:04 PM 04/26/2015

## 2015-04-26 NOTE — Progress Notes (Signed)
ANTICOAGULATION CONSULT NOTE - Initial Consult  Pharmacy Consult for Heparin Indication: Ischemia left lower extremity  Allergies  Allergen Reactions  . Penapar-Vk [Penicillin V] Nausea And Vomiting  . Sulfa Antibiotics Nausea And Vomiting    Patient Measurements: Height: 5\' 9"  (175.3 cm) Weight: 171 lb 4.8 oz (77.7 kg) IBW/kg (Calculated) : 70.7 Heparin Dosing Weight: 77.7 kg  Vital Signs: Temp: 97.8 F (36.6 C) (01/07 0700) Temp Source: Oral (01/07 0700) BP: 108/51 mmHg (01/07 1137) Pulse Rate: 47 (01/07 0800)  Labs:  Recent Labs  04/25/15 1421 04/25/15 1454 04/26/15 0510  HGB 11.8*  --  11.2*  HCT 34.6*  --  34.2*  PLT 419  --  371  APTT  --  42*  --   LABPROT  --  16.1*  --   INR  --  1.28  --   CREATININE 2.30*  --  1.97*    Estimated Creatinine Clearance: 22.9 mL/min (by C-G formula based on Cr of 1.97).   Medical History: Past Medical History  Diagnosis Date  . Peripheral vascular disease ([place])   . CHF (congestive heart failure) ([place])   . Cancer ([place])   . GERD (gastroesophageal reflux disease)   . Blood dyscrasia   . DVT (deep venous thrombosis) [facility])     Assessment: 82 yo male with PVD and ischemia of left lower extremity s/p intervention starting on heparin drip. Pt was on apixaban during this hospitalization, last dose at 1140 this morning. Spoke with Marcelle Overlie, PA regarding timing of the start of heparin drip since last dose of apixaban was this morning. Discussed that we normally start heparin drips 12 hours after last apixaban dose and PA stated that it would be ok to start heparin drip at 2330, no urgent need to start heparin immediately.  Goal of Therapy:  Heparin level 0.3-0.7 units/ml Monitor platelets by anticoagulation protocol: Yes   Plan:  Stat anti-Xa level and aPTT for baseline labs Continue orders for heparin 14 units/kg/hr (1100 units/hr =11 ml/hr) to start at 2330 tonight. (No bolus) APTT level in 8h after start of  heparin drip CBC in AM  Pharmacy will continue to follow.   Rocky Morel 04/26/2015,2:23 PM

## 2015-04-26 NOTE — Progress Notes (Signed)
Spoke with Linus Salmons, PA concerning orders for PAD on right groin site.  Per PA, leave PAD in place overnight with air intact and she will assess tomorrow.  Pt on aggrestat.

## 2015-04-26 NOTE — Progress Notes (Addendum)
HERMANN, SALYERS ([id number]) Visit Report for 04/25/2015 Arrival Information Details Patient Name: Philip [name], Philip [name]. Date of Service: 04/25/2015 9:30 AM Medical Record Number: [id number] Patient Account Number: [id number] Date of Birth/Sex: 30-Sep-1918 (82 y.o. Male) Treating RN: Montey Hora Primary Care Physician: Hortencia Pilar Other Clinician: Referring Physician: Hortencia Pilar Treating Physician/Extender: Frann Rider in Treatment: 16 Visit Information History Since Last Visit Added or deleted any medications: No Patient Arrived: Walker Any new allergies or adverse reactions: No Arrival Time: 10:10 Had a fall or experienced change in No Accompanied By: son activities of daily living that may affect Transfer Assistance: None risk of falls: Patient Identification Verified: Yes Signs or symptoms of abuse/neglect since last No Secondary Verification Process Yes visito Completed: Hospitalized since last visit: No Patient Requires Transmission- No Pain Present Now: No Based Precautions: Patient Has Alerts: Yes Patient Alerts: Patient on Blood Thinner No ABIs dt LE blood clots Electronic Signature(s) Signed: 04/25/2015 5:42:17 PM By: Montey Hora Entered By: Montey Hora on 04/25/2015 10:11:35 Philip [name] ([id number]) -------------------------------------------------------------------------------- Clinic Level of Care Assessment Details Patient Name: Philip [name]. Date of Service: 04/25/2015 9:30 AM Medical Record Number: [id number] Patient Account Number: [id number] Date of Birth/Sex: 11/04/1918 (82 y.o. Male) Treating RN: Montey Hora Primary Care Physician: Hortencia Pilar Other Clinician: Referring Physician: Hortencia Pilar Treating Physician/Extender: Frann Rider in Treatment: 82 Clinic Level of Care Assessment Items TOOL 4 Quantity Score []  - Use when only an EandM is performed on FOLLOW-UP visit 0 ASSESSMENTS - Nursing Assessment /  Reassessment X - Reassessment of Co-morbidities (includes updates in patient status) 1 10 X - Reassessment of Adherence to Treatment Plan 1 5 ASSESSMENTS - Wound and Skin Assessment / Reassessment []  - Simple Wound Assessment / Reassessment - one wound 0 X - Complex Wound Assessment / Reassessment - multiple wounds 2 5 []  - Dermatologic / Skin Assessment (not related to wound area) 0 ASSESSMENTS - Focused Assessment []  - Circumferential Edema Measurements - multi extremities 0 []  - Nutritional Assessment / Counseling / Intervention 0 X - Lower Extremity Assessment (monofilament, tuning fork, pulses) 1 5 X - Peripheral Arterial Disease Assessment (using hand held doppler) 1 10 ASSESSMENTS - Ostomy and/or Continence Assessment and Care []  - Incontinence Assessment and Management 0 []  - Ostomy Care Assessment and Management (repouching, etc.) 0 PROCESS - Coordination of Care X - Simple Patient / Family Education for ongoing care 1 15 []  - Complex (extensive) Patient / Family Education for ongoing care 0 []  - Staff obtains Programmer, systems, Records, Test Results / Process Orders 0 []  - Staff telephones HHA, Nursing Homes / Clarify orders / etc 0 []  - Routine Transfer to another Facility (non-emergent condition) 0 CATALDO, MELILLO ([id number]) []  - Routine Hospital Admission (non-emergent condition) 0 []  - New Admissions / Biomedical engineer / Ordering NPWT, Apligraf, etc. 0 []  - Emergency Hospital Admission (emergent condition) 0 X - Simple Discharge Coordination 1 10 []  - Complex (extensive) Discharge Coordination 0 PROCESS - Special Needs []  - Pediatric / Minor Patient Management 0 []  - Isolation Patient Management 0 []  - Hearing / Language / Visual special needs 0 []  - Assessment of Community assistance (transportation, D/C planning, etc.) 0 []  - Additional assistance / Altered mentation 0 []  - Support Surface(s) Assessment (bed, cushion, seat, etc.) 0 INTERVENTIONS - Wound Cleansing /  Measurement []  - Simple Wound Cleansing - one wound 0 X - Complex Wound Cleansing - multiple wounds 2 5 X - Wound Imaging (photographs - any  number of wounds) 1 5 []  - Wound Tracing (instead of photographs) 0 []  - Simple Wound Measurement - one wound 0 X - Complex Wound Measurement - multiple wounds 2 5 INTERVENTIONS - Wound Dressings X - Small Wound Dressing one or multiple wounds 1 10 X - Medium Wound Dressing one or multiple wounds 1 15 []  - Large Wound Dressing one or multiple wounds 0 []  - Application of Medications - topical 0 []  - Application of Medications - injection 0 INTERVENTIONS - Miscellaneous []  - External ear exam 0 TYWAUN, TROUP ([id number]) []  - Specimen Collection (cultures, biopsies, blood, body fluids, etc.) 0 []  - Specimen(s) / Culture(s) sent or taken to Lab for analysis 0 []  - Patient Transfer (multiple staff / Harrel Lemon Lift / Similar devices) 0 []  - Simple Staple / Suture removal (25 or less) 0 []  - Complex Staple / Suture removal (26 or more) 0 []  - Hypo / Hyperglycemic Management (close monitor of Blood Glucose) 0 []  - Ankle / Brachial Index (ABI) - do not check if billed separately 0 X - Vital Signs 1 5 Has the patient been seen at the hospital within the last three years: Yes Total Score: 120 Level Of Care: New/Established - Level 4 Electronic Signature(s) Signed: 04/25/2015 12:15:38 PM By: Montey Hora Entered By: Montey Hora on 04/25/2015 12:15:37 Philip [name] ([id number]) -------------------------------------------------------------------------------- Encounter Discharge Information Details Patient Name: Philip [name]. Date of Service: 04/25/2015 9:30 AM Medical Record Number: [id number] Patient Account Number: [id number] Date of Birth/Sex: [birth year] (82 y.o. Male) Treating RN: Montey Hora Primary Care Physician: Hortencia Pilar Other Clinician: Referring Physician: Hortencia Pilar Treating Physician/Extender: Frann Rider in  Treatment: 67 Encounter Discharge Information Items Discharge Pain Level: 0 Discharge Condition: Stable Ambulatory Status: Walker Discharge Destination: Home Private Transportation: Auto Accompanied By: son Schedule Follow-up Appointment: Yes Medication Reconciliation completed and No provided to Patient/Care Won Kreuzer: Patient Clinical Summary of Care: Declined Electronic Signature(s) Signed: 04/25/2015 1:19:15 PM By: Montey Hora Previous Signature: 04/25/2015 10:46:17 AM Version By: Ruthine Dose Entered By: Montey Hora on 04/25/2015 13:19:15 Philip [name] ([id number]) -------------------------------------------------------------------------------- Lower Extremity Assessment Details Patient Name: TYLIQUE, CIRILLO. Date of Service: 04/25/2015 9:30 AM Medical Record Number: [id number] Patient Account Number: [id number] Date of Birth/Sex: August 09, 1918 (82 y.o. Male) Treating RN: Montey Hora Primary Care Physician: Hortencia Pilar Other Clinician: Referring Physician: Hortencia Pilar Treating Physician/Extender: Frann Rider in Treatment: 59 Vascular Assessment Pulses: Posterior Tibial Dorsalis Pedis Palpable: [Left:No] Doppler: [Left:Inaudible] Extremity colors, hair growth, and conditions: Extremity Color: [Left:Mottled] Hair Growth on Extremity: [Left:No] Temperature of Extremity: [Left:Cold] Capillary Refill: [Left:< 3 seconds] Electronic Signature(s) Signed: 04/25/2015 5:42:17 PM By: Montey Hora Entered By: Montey Hora on 04/25/2015 10:21:21 Philip [name] ([id number]) -------------------------------------------------------------------------------- Multi Wound Chart Details Patient Name: Philip [name]. Date of Service: 04/25/2015 9:30 AM Medical Record Number: [id number] Patient Account Number: [id number] Date of Birth/Sex: 1918-11-03 (82 y.o. Male) Treating RN: Montey Hora Primary Care Physician: Hortencia Pilar Other Clinician: Referring  Physician: Hortencia Pilar Treating Physician/Extender: Frann Rider in Treatment: 79 Vital Signs Height(in): 69 Pulse(bpm): 57 Weight(lbs): 179 Blood Pressure 114/57 (mmHg): Body Mass Index(BMI): 26 Temperature(F): 97.9 Respiratory Rate 16 (breaths/min): Photos: [4:No Photos] [5R:No Photos] [N/A:N/A] Wound Location: [4:Left Malleolus - Medial] [5R:Right Forearm - Lateral] [N/A:N/A] Wounding Event: [4:Other Lesion] [5R:Shear/Friction] [N/A:N/A] Primary Etiology: [4:Malignant Wound] [5R:Skin Tear] [N/A:N/A] Comorbid History: [4:Cataracts, Arrhythmia, Congestive Heart Failure] [5R:Cataracts, Arrhythmia, Congestive Heart Failure] [N/A:N/A] Date Acquired: [4:09/20/2014] [5R:09/22/2014] [N/A:N/A] Weeks of Treatment: [4:31] [5R:29] [N/A:N/A] Wound Status: [  4:Open] [5R:Open] [N/A:N/A] Wound Recurrence: [4:No] [5R:Yes] [N/A:N/A] Measurements L x W x D 3.3x2x0.3 [5R:2.3x2.8x0.1] [N/A:N/A] (cm) Area (cm) : [4:5.184] [5R:5.058] [N/A:N/A] Volume (cm) : [4:1.555] [5R:0.506] [N/A:N/A] % Reduction in Area: [4:-3201.90%] [5R:19.50%] [N/A:N/A] % Reduction in Volume: -9618.70% [5R:19.40%] [N/A:N/A] Classification: [4:Full Thickness With Exposed Support Structures] [5R:Partial Thickness] [N/A:N/A] Exudate Amount: [4:Large] [5R:Large] [N/A:N/A] Exudate Type: [4:Serous] [5R:Serosanguineous] [N/A:N/A] Exudate Color: [4:amber] [5R:red, brown] [N/A:N/A] Wound Margin: [4:Distinct, outline attached] [5R:Flat and Intact] [N/A:N/A] Granulation Amount: [4:Medium (34-66%)] [5R:Large (67-100%)] [N/A:N/A] Granulation Quality: [4:Pink] [5R:Red, Pink] [N/A:N/A] Necrotic Amount: [4:Medium (34-66%)] [5R:None Present (0%)] [N/A:N/A] Exposed Structures: [4:Fascia: No Fat: No Tendon: No Muscle: No] [5R:Fascia: No Fat: No Tendon: No Muscle: No] [N/A:N/A] Joint: No Joint: No Bone: No Bone: No Limited to Skin Limited to Skin Breakdown Breakdown Epithelialization: None Medium (34-66%) N/A Periwound Skin  Texture: Edema: Yes Edema: Yes N/A Excoriation: No Induration: No Callus: No Crepitus: No Fluctuance: No Friable: No Rash: No Scarring: No Periwound Skin Maceration: Yes Moist: Yes N/A Moisture: Moist: Yes Maceration: No Dry/Scaly: No Periwound Skin Color: Cyanosis: Yes Atrophie Blanche: No N/A Erythema: Yes Cyanosis: No Hemosiderin Staining: Yes Ecchymosis: No Erythema: No Hemosiderin Staining: No Mottled: No Pallor: No Rubor: No Erythema Location: Circumferential N/A N/A Temperature: No Abnormality No Abnormality N/A Tenderness on Yes No N/A Palpation: Wound Preparation: Ulcer Cleansing: Ulcer Cleansing: N/A Rinsed/Irrigated with Rinsed/Irrigated with Saline Saline Topical Anesthetic Topical Anesthetic Applied: None Applied: None Treatment Notes Electronic Signature(s) Signed: 04/25/2015 5:42:17 PM By: Montey Hora Entered By: Montey Hora on 04/25/2015 10:29:41 Philip [name] ([id number]) -------------------------------------------------------------------------------- [place] Details Patient Name: SALAR, KOLBA. Date of Service: 04/25/2015 9:30 AM Medical Record Number: [id number] Patient Account Number: [id number] Date of Birth/Sex: April 14, 1919 (82 y.o. Male) Treating RN: Montey Hora Primary Care Physician: Hortencia Pilar Other Clinician: Referring Physician: Hortencia Pilar Treating Physician/Extender: Frann Rider in Treatment: 41 Active Inactive Electronic Signature(s) Signed: 05/06/2015 8:36:33 AM By: Montey Hora Previous Signature: 04/25/2015 5:42:17 PM Version By: Montey Hora Entered By: Montey Hora on 05/06/2015 08:36:33 Philip [name] ([id number]) -------------------------------------------------------------------------------- Patient/Caregiver Education Details Patient Name: WOODFIN, MAYABB. Date of Service: 04/25/2015 9:30 AM Medical Record Number: [id number] Patient Account Number: [id number] Date of  Birth/Gender: 1919-02-11 (82 y.o. Male) Treating RN: Montey Hora Primary Care Physician: Hortencia Pilar Other Clinician: Referring Physician: Hortencia Pilar Treating Physician/Extender: Frann Rider in Treatment: 26 Education Assessment Education Provided To: Patient and Caregiver Education Topics Provided Wound/Skin Impairment: Handouts: Other: patient to go to Dr Ozella Almond office right now Methods: Explain/Verbal Responses: State content correctly [employer]) Signed: 04/25/2015 1:19:50 PM By: Montey Hora Entered By: Montey Hora on 04/25/2015 13:19:50 Philip [name] ([id number]) -------------------------------------------------------------------------------- Wound Assessment Details Patient Name: KEYANTE, KHOURY. Date of Service: 04/25/2015 9:30 AM Medical Record Number: [id number] Patient Account Number: [id number] Date of Birth/Sex: June 13, 1918 (82 y.o. Male) Treating RN: Montey Hora Primary Care Physician: Hortencia Pilar Other Clinician: Referring Physician: Hortencia Pilar Treating Physician/Extender: Frann Rider in Treatment: 40 Wound Status Wound Number: 4 Primary Malignant Wound Etiology: Wound Location: Left Malleolus - Medial Wound Status: Open Wounding Event: Other Lesion Comorbid Cataracts, Arrhythmia, Congestive Date Acquired: 09/20/2014 History: Heart Failure Weeks Of Treatment: 31 Clustered Wound: No Photos Photo Uploaded By: Montey Hora on 04/25/2015 16:48:48 Wound Measurements Length: (cm) 3.3 Width: (cm) 2 Depth: (cm) 0.3 Area: (cm) 5.184 Volume: (cm) 1.555 % Reduction in Area: -3201.9% % Reduction in Volume: -9618.7% Epithelialization: None Tunneling: No Undermining: No Wound Description Full Thickness With Exposed Foul Odor Afte  Classification: Support Structures Wound Margin: Distinct, outline attached Exudate Large Amount: Exudate Type: Serous Exudate Color: amber r Cleansing: No Wound  Bed Granulation Amount: Medium (34-66%) Exposed Structure Granulation Quality: Pink Fascia Exposed: No Necrotic Amount: Medium (34-66%) Fat Layer Exposed: No CAPRICE, DRAKOS ([id number]) Necrotic Quality: Adherent Slough Tendon Exposed: No Muscle Exposed: No Joint Exposed: No Bone Exposed: No Limited to Skin Breakdown Periwound Skin Texture Texture Color No Abnormalities Noted: No No Abnormalities Noted: No Localized Edema: Yes Cyanosis: Yes Erythema: Yes Moisture Erythema Location: Circumferential No Abnormalities Noted: No Hemosiderin Staining: Yes Maceration: Yes Moist: Yes Temperature / Pain Temperature: No Abnormality Tenderness on Palpation: Yes Wound Preparation Ulcer Cleansing: Rinsed/Irrigated with Saline Topical Anesthetic Applied: None Treatment Notes Wound #4 (Left, Medial Malleolus) 1. Cleansed with: Clean wound with Normal Saline 4. Dressing Applied: Saline moistened guaze 5. Secondary Dressing Applied Bordered Foam Dressing Electronic Signature(s) Signed: 04/25/2015 5:42:17 PM By: Montey Hora Entered By: Montey Hora on 04/25/2015 10:26:47 Philip [name] ([id number]) -------------------------------------------------------------------------------- Wound Assessment Details Patient Name: MARTIE, CAVAZOS. Date of Service: 04/25/2015 9:30 AM Medical Record Number: [id number] Patient Account Number: [id number] Date of Birth/Sex: 09-17-1918 (82 y.o. Male) Treating RN: Montey Hora Primary Care Physician: Hortencia Pilar Other Clinician: Referring Physician: Hortencia Pilar Treating Physician/Extender: Frann Rider in Treatment: 47 Wound Status Wound Number: 5R Primary Skin Tear Etiology: Wound Location: Right Forearm - Lateral Wound Status: Open Wounding Event: Shear/Friction Comorbid Cataracts, Arrhythmia, Congestive Date Acquired: 09/22/2014 History: Heart Failure Weeks Of Treatment: 29 Clustered Wound: No Photos Photo Uploaded By:  Montey Hora on 04/25/2015 16:49:51 Wound Measurements Length: (cm) 2.3 Width: (cm) 2.8 Depth: (cm) 0.1 Area: (cm) 5.058 Volume: (cm) 0.506 % Reduction in Area: 19.5% % Reduction in Volume: 19.4% Epithelialization: Medium (34-66%) Tunneling: No Undermining: No Wound Description Classification: Partial Thickness Wound Margin: Flat and Intact Exudate Amount: Large Exudate Type: Serosanguineous Exudate Color: red, brown Foul Odor After Cleansing: No Wound Bed Granulation Amount: Large (67-100%) Exposed Structure Granulation Quality: Red, Pink Fascia Exposed: No Necrotic Amount: None Present (0%) Fat Layer Exposed: No Tendon Exposed: No OLANDO, ROZZELL ([id number]) Muscle Exposed: No Joint Exposed: No Bone Exposed: No Limited to Skin Breakdown Periwound Skin Texture Texture Color No Abnormalities Noted: No No Abnormalities Noted: No Callus: No Atrophie Blanche: No Crepitus: No Cyanosis: No Excoriation: No Ecchymosis: No Fluctuance: No Erythema: No Friable: No Hemosiderin Staining: No Induration: No Mottled: No Localized Edema: Yes Pallor: No Rash: No Rubor: No Scarring: No Temperature / Pain Moisture Temperature: No Abnormality No Abnormalities Noted: No Dry / Scaly: No Maceration: No Moist: Yes Wound Preparation Ulcer Cleansing: Rinsed/Irrigated with Saline Topical Anesthetic Applied: None Treatment Notes Wound #5R (Right, Lateral Forearm) 1. Cleansed with: Clean wound with Normal Saline 3. Peri-wound Care: Antifungal powder 4. Dressing Applied: Other dressing (specify in notes) 5. Secondary Dressing Applied Kerlix/Conform Notes mepilex lite, conform, coban Electronic Signature(s) Signed: 04/25/2015 5:42:17 PM By: Montey Hora Entered By: Montey Hora on 04/25/2015 10:27:11 Philip [name] ([id number]) -------------------------------------------------------------------------------- [place] Details Patient Name: CHANTE, MIDENCE. Date  of Service: 04/25/2015 9:30 AM Medical Record Number: [id number] Patient Account Number: [id number] Date of Birth/Sex: 1918-08-30 (82 y.o. Male) Treating RN: Montey Hora Primary Care Physician: Hortencia Pilar Other Clinician: Referring Physician: Hortencia Pilar Treating Physician/Extender: Frann Rider in Treatment: 97 Vital Signs Time Taken: 10:12 Temperature (F): 97.9 Height (in): 69 Pulse (bpm): 57 Weight (lbs): 179 Respiratory Rate (breaths/min): 16 Body Mass Index (BMI): 26.4 Blood Pressure (mmHg): 114/57 Reference Range: 80 -  120 mg / dl Electronic Signature(s) Signed: 04/25/2015 5:42:17 PM By: Montey Hora Entered By: Montey Hora on 04/25/2015 10:11:57

## 2015-04-26 NOTE — Progress Notes (Addendum)
[facility]  Daily Progress Note  Subjective: 1 Day Post-Op: left lower extremity angiogram with catheter directed thrombolysis with 4 mg of TPA to the left popliteal artery tibioperoneal trunk and peroneal artery with the AngioJet Omni catheter, mechanical rheolytic thrombectomy with the AngioJet on the catheter to the left popliteal artery, tibioperoneal trunk, and peroneal artery, percutaneous transluminal angioplasty of left peroneal artery and tibioperoneal trunk with 2.5 and 3 mm diameter angioplasty balloon, percutaneous transluminal angioplasty of left popliteal artery with 4 mm diameter angioplasty balloon, covered stent placement to left popliteal artery for greater than 50% residual stenosis in residual thrombosis after angioplasty using a 5 mm diameter by 15 cm length Viabahn covered stent, StarClose closure device right femoral artery  Patient sleeping comfortably during exam. Son and daughter at bedside.   Objective: Filed Vitals:   04/26/15 0600 04/26/15 0700 04/26/15 0800 04/26/15 1137  BP: 144/52 137/49 136/54 108/51  Pulse: 49 63 47   Temp:  97.8 F (36.6 C)    TempSrc:  Oral    Resp: 25 25 24    Height:      Weight:      SpO2: 95% 96% 97%     Intake/Output Summary (Last 24 hours) at 04/26/15 1356 Last data filed at 04/26/15 0600  Gross per 24 hour  Intake  458.6 ml  Output    600 ml  Net -141.4 ml    Physical Exam: NAD CV: RRR Pulmonary: CTA Bilaterally Abdomen: Soft, Nontender, Nondistended Vascular:  Left lower extremity: left foot cold, blue, no palpable pulses up to ankle. Calf with some warm. Calf soft. Thigh warm. Thigh soft.  Right Groin: pressure device intact, no drainage or swelling noted   Laboratory: CBC    Component Value Date/Time   WBC 16.0* 04/26/2015 0510   HGB 11.2* 04/26/2015 0510   HCT 34.2* 04/26/2015 0510   PLT 371 04/26/2015 0510   BMET    Component Value Date/Time   NA 137 04/26/2015 0510   K 4.1  04/26/2015 0510   CL 105 04/26/2015 0510   CO2 24 04/26/2015 0510   GLUCOSE 144* 04/26/2015 0510   BUN 38* 04/26/2015 0510   CREATININE 1.97* 04/26/2015 0510   CALCIUM 8.3* 04/26/2015 0510   GFRNONAA 27* 04/26/2015 0510   GFRAA 32* 04/26/2015 0510   Assessment/Planning: 82 year old male with PAD with rest pain and ulceration LLE, acute on chronic ischemia taken to IR last evening for left lower extremity angiogram with intervention  1) As per physical exam, intervention doesn't seem to have been successful.  2) Will allow patients leg to declare itself and plan on either BKA or AKA on Monday. 3) Heparin gtt 4) Clindamycin 5) Stepdown status. 6) Pain Control  Marcelle Overlie PA-C 04/26/2015 1:56 PM

## 2015-04-27 LAB — APTT
aPTT: 77 s — ABNORMAL HIGH (ref 24–36)
aPTT: 88 seconds — ABNORMAL HIGH (ref 24–36)

## 2015-04-27 LAB — BASIC METABOLIC PANEL WITH GFR
Anion gap: 4 — ABNORMAL LOW (ref 5–15)
BUN: 37 mg/dL — ABNORMAL HIGH (ref 6–20)
CO2: 26 mmol/L (ref 22–32)
Calcium: 8.4 mg/dL — ABNORMAL LOW (ref 8.9–10.3)
Chloride: 106 mmol/L (ref 101–111)
Creatinine, Ser: 2.01 mg/dL — ABNORMAL HIGH (ref 0.61–1.24)
GFR calc Af Amer: 31 mL/min — ABNORMAL LOW (ref >60)
GFR calc non Af Amer: 27 mL/min — ABNORMAL LOW (ref >60)
Glucose, Bld: 115 mg/dL — ABNORMAL HIGH (ref 65–99)
Potassium: 4.3 mmol/L (ref 3.5–5.1)
Sodium: 136 mmol/L (ref 135–145)

## 2015-04-27 LAB — CBC
HCT: 34.5 % — ABNORMAL LOW (ref 40.0–52.0)
Hemoglobin: 11.3 g/dL — ABNORMAL LOW (ref 13.0–18.0)
MCH: 30.7 pg (ref 26.0–34.0)
MCHC: 32.8 g/dL (ref 32.0–36.0)
MCV: 93.6 fL (ref 80.0–100.0)
Platelets: 364 K/uL (ref 150–440)
RBC: 3.69 MIL/uL — ABNORMAL LOW (ref 4.40–5.90)
RDW: 12.8 % (ref 11.5–14.5)
WBC: 20.5 K/uL — ABNORMAL HIGH (ref 3.8–10.6)

## 2015-04-27 LAB — HEPARIN LEVEL (UNFRACTIONATED): Heparin Unfractionated: 0.95 [IU]/mL — ABNORMAL HIGH (ref 0.30–0.70)

## 2015-04-27 LAB — MAGNESIUM: Magnesium: 2.3 mg/dL (ref 1.7–2.4)

## 2015-04-27 NOTE — Consult Note (Signed)
[facility] Cardiology  CARDIOLOGY CONSULT NOTE  Patient ID: Philip [name] MRN: [id number] DOB/AGE: 10-03-1918 82 y.o.  Admit date: 04/25/2015 Referring Physician Dew Primary Physician Grandis Primary Cardiologist Fath Reason for Consultation atrial arrhythmias  HPI: 82 year old gentleman referred for evaluation of atrial arrhythmias. The patient is status post thrombolyzes, atherectomy and percutaneous transluminal angioplasty of left popliteal artery for acute on chronic lower extremity ischemia. Telemetry has revealed frequent atrial ectopy with brief runs of atrial fibrillation, asymptomatic. The patient denies chest pain or shortness of breath. He is not experiencing palpitations or heart racing. Endovascular intervention of left lower extremity does not appear to be successful, however, patient appears to be hesitant to proceed with amputation.  Review of systems complete and found to be negative unless listed above     Past Medical History  Diagnosis Date  . Peripheral vascular disease ([place])   . CHF (congestive heart failure) ([place])   . Cancer ([place])   . GERD (gastroesophageal reflux disease)   . Blood dyscrasia   . DVT (deep venous thrombosis) [facility])     Past Surgical History  Procedure Laterality Date  . Breast surgery    . Hernia repair    . Peripheral vascular catheterization Left 04/24/2015    Procedure: Lower Extremity Angiography;  Surgeon: Algernon Huxley, MD;  Location: [place] CV [facility];  Service: Cardiovascular;  Laterality: Left;  . Peripheral vascular catheterization Left 04/24/2015    Procedure: Lower Extremity Intervention;  Surgeon: Algernon Huxley, MD;  Location: [place] [facility];  Service: Cardiovascular;  Laterality: Left;    Prescriptions prior to admission  Medication Sig Dispense Refill Last Dose  . apixaban (ELIQUIS) 2.5 MG TABS tablet Take 2.5 mg by mouth 2 (two) times daily.   04/21/2015  . aspirin 81 MG tablet Take 81 mg by mouth daily.   04/21/2015 at Unknown time   . docusate sodium (COLACE) 100 MG capsule Take 100 mg by mouth 2 (two) times daily.   04/24/2015 at Unknown time  . DULoxetine (CYMBALTA) 30 MG capsule Take 30 mg by mouth daily.   04/24/2015 at Unknown time  . furosemide (LASIX) 40 MG tablet Take 40 mg by mouth.   04/24/2015 at Unknown time  . lisinopril (PRINIVIL,ZESTRIL) 2.5 MG tablet Take 2.5 mg by mouth daily.   04/24/2015 at Unknown time  . Melatonin 1 MG TABS Take 1 tablet by mouth.   04/23/2015 at Unknown time  . metoprolol succinate (TOPROL-XL) 25 MG 24 hr tablet Take 25 mg by mouth daily.   04/24/2015 at Unknown time  . Multiple Vitamins-Minerals (CENTRUM MEN PO) Take 1 tablet by mouth every morning.   04/24/2015 at Unknown time  . ranitidine (ZANTAC) 300 MG capsule Take 300 mg by mouth every evening.   04/23/2015 at Unknown time  . tamsulosin (FLOMAX) 0.4 MG CAPS capsule Take 0.4 mg by mouth.   04/24/2015 at Unknown time  . traZODone (DESYREL) 100 MG tablet Take 100 mg by mouth at bedtime.   04/23/2015 at Unknown time   Social History   Social History  . Marital Status: Married    Spouse Name: N/A  . Number of Children: N/A  . Years of Education: N/A   Occupational History  . Not on file.   Social History Main Topics  . Smoking status: Former Smoker    Types: Cigars  . Smokeless tobacco: Not on file  . Alcohol Use: No  . Drug Use: Not on file  . Sexual Activity: Not  Currently   Other Topics Concern  . Not on file   Social History Narrative    History reviewed. No pertinent family history.    Review of systems complete and found to be negative unless listed above      PHYSICAL EXAM  General: Well developed, well nourished, in no acute distress HEENT:  Normocephalic and atramatic Neck:  No JVD.  Lungs: Clear bilaterally to auscultation and percussion. Heart: HRRR . Normal S1 and S2 without gallops or murmurs.  Abdomen: Bowel sounds are positive, abdomen soft and non-tender  Msk:  Back normal, normal gait. Normal strength and  tone for age. Extremities: No clubbing, cyanosis or edema.   Neuro: Alert and oriented X 3. Psych:  Good affect, responds appropriately  Labs:   Lab Results  Component Value Date   WBC 20.5* 04/27/2015   HGB 11.3* 04/27/2015   HCT 34.5* 04/27/2015   MCV 93.6 04/27/2015   PLT 364 04/27/2015    Recent Labs Lab 04/27/15 0714  NA 136  K 4.3  CL 106  CO2 26  BUN 37*  CREATININE 2.01*  CALCIUM 8.4*  GLUCOSE 115*   No results found for: CKTOTAL, CKMB, CKMBINDEX, TROPONINI No results found for: CHOL No results found for: HDL No results found for: LDLCALC No results found for: TRIG No results found for: CHOLHDL No results found for: LDLDIRECT    Radiology: No results found.  EKG: Predominant sinus rhythm, frequent atrial ectopy, brief atrial runs probable paroxysmal atrial fibrillation  ASSESSMENT AND PLAN:   1. Frequent premature atrial contractions, brief runs of atrial fibrillation, asymptomatic, currently on metoprolol succinate 2. Unsuccessful intravascular intervention of acute on chronic left lower extremity ischemia, patient hasn't to proceed with amputation, wishes palliative care  Recommendations  1. Agree with overall current therapy 2. If atrial arrhythmias becomes more problematic, consider up titration of metoprolol succinate 3. Palliative care consult pending   Signed: Isaias Cowman MD,PhD, [facility] 04/27/2015, 1:43 PM

## 2015-04-27 NOTE — Progress Notes (Addendum)
[facility]  Daily Progress Note  Subjective: 2 Days Post-Op: left lower extremity angiogram with catheter directed thrombolysis with 4 mg of TPA to the left popliteal artery tibioperoneal trunk and peroneal artery with the AngioJet Omni catheter, mechanical rheolytic thrombectomy with the AngioJet on the catheter to the left popliteal artery, tibioperoneal trunk, and peroneal artery, percutaneous transluminal angioplasty of left peroneal artery and tibioperoneal trunk with 2.5 and 3 mm diameter angioplasty balloon, percutaneous transluminal angioplasty of left popliteal artery with 4 mm diameter angioplasty balloon, covered stent placement to left popliteal artery for greater than 50% residual stenosis in residual thrombosis after angioplasty using a 5 mm diameter by 15 cm length Viabahn covered stent, StarClose closure device right femoral artery  Patient resting comfortably in bed. Asking about end of life issues if he doesn't proceed with amputation. Denies any pain.   Objective: Filed Vitals:   04/27/15 0900 04/27/15 1000 04/27/15 1054 04/27/15 1100  BP: 138/55 142/86 142/86 133/59  Pulse: 82 61 109 105  Temp:      TempSrc:      Resp: 26 28  23   Height:      Weight:      SpO2: 92% 95%  97%    Intake/Output Summary (Last 24 hours) at 04/27/15 1255 Last data filed at 04/27/15 1200  Gross per 24 hour  Intake 290.73 ml  Output   1150 ml  Net -859.27 ml    Physical Exam: A&Ox3, NAD CV: RRR Pulmonary: CTA Bilaterally Abdomen: Soft, Nontender, Nondistended Vascular: Left lower extremity: left foot cold, cyanosis increased slightly toward ankle, no palpable pulses up to ankle. Calf with some warm. Calf soft. Thigh warm. Thigh soft. Right Groin: no drainage or swelling noted   Laboratory: CBC    Component Value Date/Time   WBC 20.5* 04/27/2015 0714   HGB 11.3* 04/27/2015 0714   HCT 34.5* 04/27/2015 0714   PLT 364 04/27/2015 0714    BMET    Component Value Date/Time   NA 136 04/27/2015 0714   K 4.3 04/27/2015 0714   CL 106 04/27/2015 0714   CO2 26 04/27/2015 0714   GLUCOSE 115* 04/27/2015 0714   BUN 37* 04/27/2015 0714   CREATININE 2.01* 04/27/2015 0714   CALCIUM 8.4* 04/27/2015 0714   GFRNONAA 27* 04/27/2015 0714   GFRAA 31* 04/27/2015 0714   Assessment/Planning: 82 year old male with PAD with rest pain and ulceration LLE, acute on chronic ischemia taken to IR last evening for left lower extremity angiogram with intervention  1) As per physical exam, endovascular intervention doesn't seem to have been successful.  2) Will allow patients leg to declare itself and plan on either BKA or AKA on Monday however after speaking to patient, seems he does not want to proceed with amputation and may choose hospice. Will order palliative care consult. Will also make NPO after midnight if patient changes his mind and decides to go forward with amputation - will then amputate tomorrow. 3) Heparin gtt 4) Clindamycin - necrotic left foot / necrotic ankle wound 5) Stepdown status. 6) Pain Control 7) patient experiencing sinus arrhythmia with heart rate ranging from low 100's to 160's - cards consult. 8) Patient and family choose patient to be DNR - order changed to DNR  Marcelle Overlie PA-C 04/27/2015 12:55 PM

## 2015-04-27 NOTE — Progress Notes (Signed)
ANTICOAGULATION CONSULT NOTE -FOLLOW UP  Pharmacy Consult for Heparin Indication: Ischemia left lower extremity  Allergies  Allergen Reactions  . Penapar-Vk [Penicillin V] Nausea And Vomiting  . Sulfa Antibiotics Nausea And Vomiting    Patient Measurements: Height: 5\' 9"  (175.3 cm) Weight: 170 lb 13.7 oz (77.5 kg) IBW/kg (Calculated) : 70.7 Heparin Dosing Weight: 77.7 kg  Vital Signs: Temp: 98.5 F (36.9 C) (01/08 0700) Temp Source: Oral (01/08 0700) BP: 146/47 mmHg (01/08 0700) Pulse Rate: 79 (01/08 0700)  Labs:  Recent Labs  04/25/15 1421 04/25/15 1454 04/26/15 0510 04/26/15 1440 04/27/15 0714  HGB 11.8*  --  11.2*  --  11.3*  HCT 34.6*  --  34.2*  --  34.5*  PLT 419  --  371  --  364  APTT  --  42*  --  45* 77*  LABPROT  --  16.1*  --   --   --   INR  --  1.28  --   --   --   HEPARINUNFRC  --   --   --  1.94*  --   CREATININE 2.30*  --  1.97*  --  2.01*    Estimated Creatinine Clearance: 22.5 mL/min (by C-G formula based on Cr of 2.01).   Medical History: Past Medical History  Diagnosis Date  . Peripheral vascular disease ([place])   . CHF (congestive heart failure) ([place])   . Cancer ([place])   . GERD (gastroesophageal reflux disease)   . Blood dyscrasia   . DVT (deep venous thrombosis) [facility])     Assessment: 82 yo male with PVD and ischemia of left lower extremity s/p intervention starting on heparin drip. Pt was on apixaban during this hospitalization, last dose at 1140 this morning. Spoke with Marcelle Overlie, PA regarding timing of the start of heparin drip since last dose of apixaban was this morning. Discussed that we normally start heparin drips 12 hours after last apixaban dose and PA stated that it would be ok to start heparin drip at 2330, no urgent need to start heparin immediately.  Goal of Therapy:  Heparin level 0.3-0.7 units/ml Monitor platelets by anticoagulation protocol: Yes   Plan:  1/8: APTT within goal range @ 77. Will order another  APTT in 8 hours. Will need to follow until APTT and HL are within goal range. Once in goal range can start using HL to guide dosing adjustments. Next APTT scheduled to be drawn @ 15:30.  Pharmacy will continue to follow.   Barnabas Henriques D 04/27/2015,8:54 AM

## 2015-04-27 NOTE — Progress Notes (Signed)
ANTICOAGULATION CONSULT NOTE -FOLLOW UP  Pharmacy Consult for Heparin Indication: Ischemia left lower extremity  Allergies  Allergen Reactions  . Penapar-Vk [Penicillin V] Nausea And Vomiting  . Sulfa Antibiotics Nausea And Vomiting    Patient Measurements: Height: 5\' 9"  (175.3 cm) Weight: 170 lb 13.7 oz (77.5 kg) IBW/kg (Calculated) : 70.7 Heparin Dosing Weight: 77.7 kg  Vital Signs: Temp: 98.6 F (37 C) (01/08 1400) Temp Source: Axillary (01/08 1400) BP: 119/46 mmHg (01/08 1500) Pulse Rate: 51 (01/08 1500)  Labs:  Recent Labs  04/25/15 1421  04/25/15 1454 04/26/15 0510 04/26/15 1440 04/27/15 0714 04/27/15 1517  HGB 11.8*  --   --  11.2*  --  11.3*  --   HCT 34.6*  --   --  34.2*  --  34.5*  --   PLT 419  --   --  371  --  364  --   APTT  --   < > 42*  --  45* 77* 88*  LABPROT  --   --  16.1*  --   --   --   --   INR  --   --  1.28  --   --   --   --   HEPARINUNFRC  --   --   --   --  1.94*  --  0.95*  CREATININE 2.30*  --   --  1.97*  --  2.01*  --   < > = values in this interval not displayed.  Estimated Creatinine Clearance: 22.5 mL/min (by C-G formula based on Cr of 2.01).   Medical History: Past Medical History  Diagnosis Date  . Peripheral vascular disease ([place])   . CHF (congestive heart failure) ([place])   . Cancer ([place])   . GERD (gastroesophageal reflux disease)   . Blood dyscrasia   . DVT (deep venous thrombosis) [facility])     Assessment: 82 yo male with PVD and ischemia of left lower extremity s/p intervention starting on heparin drip. Pt was on apixaban during this hospitalization, last dose at 1140 this morning. Spoke with Marcelle Overlie, PA regarding timing of the start of heparin drip since last dose of apixaban was this morning. Discussed that we normally start heparin drips 12 hours after last apixaban dose and PA stated that it would be ok to start heparin drip at 2330, no urgent need to start heparin immediately.  Goal of Therapy:  APTT  level 68-109 seconds Heparin level 0.3-0.7 units/ml Monitor platelets by anticoagulation protocol: Yes   Plan:  1/8 1530: APTT within goal range @ 88, heparin level at 0.95 still being affected by apixaban. Will need to follow until APTT and HL are within goal range. Once in goal range can start using HL to guide dosing adjustments. Next APTT and heparin level scheduled to be drawn with am labs.  Pharmacy will continue to follow.   Paulina Fusi, PharmD, BCPS 04/27/2015 4:33 PM

## 2015-04-27 NOTE — Progress Notes (Signed)
Marcelle Overlie P.A. Notified of pt having sinus arrhythmia with heart rate ranging from low 100's to 160's. Acknowledged. No new orders. Chrystian Ressler E 12:00 PM 04/27/2015

## 2015-04-28 ENCOUNTER — Encounter: Payer: Self-pay | Admitting: Vascular Surgery

## 2015-04-28 LAB — CBC
HEMATOCRIT: 33.3 % — AB (ref 40.0–52.0)
HEMOGLOBIN: 11 g/dL — AB (ref 13.0–18.0)
MCH: 31.1 pg (ref 26.0–34.0)
MCHC: 33 g/dL (ref 32.0–36.0)
MCV: 94.2 fL (ref 80.0–100.0)
Platelets: 370 10*3/uL (ref 150–440)
RBC: 3.54 MIL/uL — AB (ref 4.40–5.90)
RDW: 13.1 % (ref 11.5–14.5)
WBC: 15.5 10*3/uL — AB (ref 3.8–10.6)

## 2015-04-28 LAB — BASIC METABOLIC PANEL
ANION GAP: 6 (ref 5–15)
BUN: 39 mg/dL — ABNORMAL HIGH (ref 6–20)
CHLORIDE: 104 mmol/L (ref 101–111)
CO2: 28 mmol/L (ref 22–32)
Calcium: 8.3 mg/dL — ABNORMAL LOW (ref 8.9–10.3)
Creatinine, Ser: 2.14 mg/dL — ABNORMAL HIGH (ref 0.61–1.24)
GFR calc non Af Amer: 25 mL/min — ABNORMAL LOW (ref >60)
GFR, EST AFRICAN AMERICAN: 29 mL/min — AB (ref >60)
Glucose, Bld: 94 mg/dL (ref 65–99)
POTASSIUM: 4.1 mmol/L (ref 3.5–5.1)
Sodium: 138 mmol/L (ref 135–145)

## 2015-04-28 LAB — MAGNESIUM: MAGNESIUM: 2.4 mg/dL (ref 1.7–2.4)

## 2015-04-28 LAB — HEPARIN LEVEL (UNFRACTIONATED): HEPARIN UNFRACTIONATED: 0.81 [IU]/mL — AB (ref 0.30–0.70)

## 2015-04-28 LAB — APTT
APTT: 114 s — AB (ref 24–36)
aPTT: 78 seconds — ABNORMAL HIGH (ref 24–36)

## 2015-04-28 MED ORDER — HEPARIN (PORCINE) IN NACL 100-0.45 UNIT/ML-% IJ SOLN
1000.0000 [IU]/h | INTRAMUSCULAR | Status: DC
  Administered 2015-04-29: 06:00:00 1000 [IU]/h via INTRAVENOUS
  Filled 2015-04-28 (×2): qty 250

## 2015-04-28 NOTE — Progress Notes (Signed)
ANTICOAGULATION CONSULT NOTE -FOLLOW UP  Pharmacy Consult for Heparin Indication: Ischemia left lower extremity  Allergies  Allergen Reactions  . Penapar-Vk [Penicillin V] Nausea And Vomiting  . Sulfa Antibiotics Nausea And Vomiting    Patient Measurements: Height: 5\' 9"  (175.3 cm) Weight: 172 lb 13.5 oz (78.4 kg) IBW/kg (Calculated) : 70.7 Heparin Dosing Weight: 77.7 kg  Vital Signs: Temp: 97.7 F (36.5 C) (01/09 2132) Temp Source: Oral (01/09 2132) BP: 101/56 mmHg (01/09 2132) Pulse Rate: 49 (01/09 2132)  Labs:  Recent Labs  04/26/15 0510  04/26/15 1440 04/27/15 0714 04/27/15 1517 04/28/15 0442 04/28/15 1429  HGB 11.2*  --   --  11.3*  --  11.0*  --   HCT 34.2*  --   --  34.5*  --  33.3*  --   PLT 371  --   --  364  --  370  --   APTT  --   < > 45* 77* 88* 114* 78*  HEPARINUNFRC  --   --  1.94*  --  0.95* 0.81*  --   CREATININE 1.97*  --   --  2.01*  --  2.14*  --   < > = values in this interval not displayed.  Estimated Creatinine Clearance: 21.1 mL/min (by C-G formula based on Cr of 2.14).   Medical History: Past Medical History  Diagnosis Date  . Peripheral vascular disease ([place])   . CHF (congestive heart failure) ([place])   . Cancer ([place])   . GERD (gastroesophageal reflux disease)   . Blood dyscrasia   . DVT (deep venous thrombosis) [facility])     Assessment: 82 yo male with PVD and ischemia of left lower extremity s/p intervention starting on heparin drip. Pt was on apixaban during this hospitalization, last dose at 1140 this morning. Spoke with Marcelle Overlie, PA regarding timing of the start of heparin drip since last dose of apixaban was this morning. Discussed that we normally start heparin drips 12 hours after last apixaban dose and PA stated that it would be ok to start heparin drip at 2330, no urgent need to start heparin immediately.  Goal of Therapy:  APTT level 68-109 seconds Heparin level 0.3-0.7 units/ml Monitor platelets by anticoagulation  protocol: Yes   Plan:  APTT and heparin level supratherapeutic. Will decrease to 1000 units/hr and recheck APTT in 8 hours. Will recheck heparin level with AM labs.   1/9 1400 aPTT therapeutic at 78. Continue current regimen. Recheck aPTT and heparin level in AM.  [employer], Pharm.D., BCPS  04/28/2015 10:21 PM

## 2015-04-28 NOTE — Progress Notes (Signed)
ANTICOAGULATION CONSULT NOTE -FOLLOW UP  Pharmacy Consult for Heparin Indication: Ischemia left lower extremity  Allergies  Allergen Reactions  . Penapar-Vk [Penicillin V] Nausea And Vomiting  . Sulfa Antibiotics Nausea And Vomiting    Patient Measurements: Height: 5\' 9"  (175.3 cm) Weight: 171 lb 1.2 oz (77.6 kg) IBW/kg (Calculated) : 70.7 Heparin Dosing Weight: 77.7 kg  Vital Signs: Temp: 98.2 F (36.8 C) (01/09 0200) Temp Source: Oral (01/09 0200) BP: 112/59 mmHg (01/09 0500) Pulse Rate: 66 (01/09 0500)  Labs:  Recent Labs  04/25/15 1454 04/26/15 0510 04/26/15 1440 04/27/15 0714 04/27/15 1517 04/28/15 0442  HGB  --  11.2*  --  11.3*  --  11.0*  HCT  --  34.2*  --  34.5*  --  33.3*  PLT  --  371  --  364  --  370  APTT 42*  --  45* 77* 88* 114*  LABPROT 16.1*  --   --   --   --   --   INR 1.28  --   --   --   --   --   HEPARINUNFRC  --   --  1.94*  --  0.95* 0.81*  CREATININE  --  1.97*  --  2.01*  --  2.14*    Estimated Creatinine Clearance: 21.1 mL/min (by C-G formula based on Cr of 2.14).   Medical History: Past Medical History  Diagnosis Date  . Peripheral vascular disease ([place])   . CHF (congestive heart failure) ([place])   . Cancer ([place])   . GERD (gastroesophageal reflux disease)   . Blood dyscrasia   . DVT (deep venous thrombosis) [facility])     Assessment: 82 yo male with PVD and ischemia of left lower extremity s/p intervention starting on heparin drip. Pt was on apixaban during this hospitalization, last dose at 1140 this morning. Spoke with Marcelle Overlie, PA regarding timing of the start of heparin drip since last dose of apixaban was this morning. Discussed that we normally start heparin drips 12 hours after last apixaban dose and PA stated that it would be ok to start heparin drip at 2330, no urgent need to start heparin immediately.  Goal of Therapy:  APTT level 68-109 seconds Heparin level 0.3-0.7 units/ml Monitor platelets by  anticoagulation protocol: Yes   Plan:  APTT and heparin level supratherapeutic. Will decrease to 1000 units/hr and recheck APTT in 8 hours. Will recheck heparin level with AM labs.   Carys Malina A. [place], [place].D., BCPS  04/28/2015 6:10 AM

## 2015-04-28 NOTE — Progress Notes (Signed)
[facility]  Patient Status: active  [facility] is providing the following services: SN/HHA  If patient discharges after hours, please call [phone].   Florene Glen 04/28/2015, 12:23 PM

## 2015-04-28 NOTE — Progress Notes (Signed)
[facility]  SUBJECTIVE: I don't have chest pain   Filed Vitals:   04/28/15 0700 04/28/15 0757 04/28/15 0800 04/28/15 0900  BP: 109/41  105/50 96/50  Pulse: 61  40 45  Temp:  98.2 F (36.8 C)    TempSrc:  Axillary    Resp: 17  17 17   Height:      Weight:      SpO2: 100%  99% 98%     Intake/Output Summary (Last 24 hours) at 04/28/15 0934 Last data filed at 04/28/15 0900  Gross per 24 hour  Intake    733 ml  Output    300 ml  Net    433 ml      PHYSICAL EXAM  General: Well developed, well nourished, in no acute distress HEENT:  Normocephalic and atramatic Neck:  No JVD.  Lungs: Clear bilaterally to auscultation and percussion. Heart: HRRR . Normal S1 and S2 without gallops or murmurs.  Abdomen: Bowel sounds are positive, abdomen soft and non-tender  Msk:  Back normal, normal gait. Normal strength and tone for age. Extremities: Gangrene left foot  Neuro: Alert and oriented X 3. Psych:  Good affect, responds appropriately   LABS: Basic Metabolic Panel:  Recent Labs  04/27/15 0714 04/28/15 0442  NA 136 138  K 4.3 4.1  CL 106 104  CO2 26 28  GLUCOSE 115* 94  BUN 37* 39*  CREATININE 2.01* 2.14*  CALCIUM 8.4* 8.3*  MG 2.3 2.4   Liver Function Tests: No results for input(s): AST, ALT, ALKPHOS, BILITOT, PROT, ALBUMIN in the last 72 hours. No results for input(s): LIPASE, AMYLASE in the last 72 hours. CBC:  Recent Labs  04/27/15 0714 04/28/15 0442  WBC 20.5* 15.5*  HGB 11.3* 11.0*  HCT 34.5* 33.3*  MCV 93.6 94.2  PLT 364 370   Cardiac Enzymes: No results for input(s): CKTOTAL, CKMB, CKMBINDEX, TROPONINI in the last 72 hours. BNP: Invalid input(s): POCBNP D-Dimer: No results for input(s): DDIMER in the last 72 hours. Hemoglobin A1C: No results for input(s): HGBA1C in the last 72 hours. Fasting Lipid Panel: No results for input(s): CHOL, HDL, LDLCALC, TRIG, CHOLHDL, LDLDIRECT in the last 72 hours. Thyroid Function Tests: No results for  input(s): TSH, T4TOTAL, T3FREE, THYROIDAB in the last 72 hours.  Invalid input(s): FREET3 Anemia Panel: No results for input(s): VITAMINB12, FOLATE, FERRITIN, TIBC, IRON, RETICCTPCT in the last 72 hours.  No results found.   Echo   TELEMETRY: Predominant normal sinus rhythm  ASSESSMENT AND PLAN:  Active Problems:   Ischemic leg    1. Frequent atrial ectopy, brief paroxysmal atrial fibrillation, clinically stable, predominantly in sinus rhythm 2. Unsuccessful revascularization left lower extremity  Recommendations  1. Continue current medications 2. Continue metoprolol succinate for control of atrial arrhythmias 3. No further cardiac diagnostics at this time 4. Palliative care consult pending  Signed off for now, please call if any questions   Rickiya Picariello, MD, PhD, [facility] 04/28/2015 9:34 AM

## 2015-04-28 NOTE — Progress Notes (Signed)
New referral for [facility] at home following discharge. Philip [name] is a 82 year old man who was admitted for a revascularization procedure to restore blood flow to his left foot. This procedure was unsuccessful. Per chart note review his left foot and ankle are ischemic, no further interventions planned. Patient has decline amputation. Patient and his family have decided for him to return home with hospice services. 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  Writer met in the room with the patient, his daughters Sharyn Dross and son Delfino Lovett (goes by Richardson Landry). Patient was alert and involved in the conversation. Education initiated regarding hospice services, philosophy and team approach to care with questions answered. Daughters live out of town and son lives next door. They plan to hire 24 hour care but daughters will also be in the home at this time. Family has requested a hospital bed with over bed table. Patient is currently on oxygen at 2 liters, he has requested NOT to have oxygen in the home. He is currently on 2 liters and was not on oxygen prior to this admission. Writer spoke with [facility] Joni Reining regarding a trial with out oxygen to determine need.  Hospice information and contact numbers left with family. Patient's daughter Hurshel Keys to be the contact for equipment delivery. Request has been made for delivery tomorrow morning to allow the family time to arrange the room and move current furniture. Patient information faxed to referral. Will follow through final disposition. Thank you. Flo Shanks RN, BSN, [facility] [place], [facility] [phone] c

## 2015-04-28 NOTE — Progress Notes (Addendum)
Philip [name], Philip [name] ([id number]) Visit Report for 04/25/2015 Chief Complaint Document Details Patient Name: Philip [name], Philip [name]. Date of Service: 04/25/2015 9:30 AM Medical Record Number: [id number] Patient Account Number: [id number] Date of Birth/Sex: 22-Nov-1918 (82 y.o. Male) Treating RN: Montey Hora Primary Care Physician: Hortencia Pilar Other Clinician: Referring Physician: Hortencia Pilar Treating Physician/Extender: Frann Rider in Treatment: 66 Information Obtained from: Patient Chief Complaint R foot ulcer. L forearm ulcer. 07/19/2014 -- about 2 weeks ago he had a surgical procedure done by dermatologist in [place] and has an open surgical wound on the dorsum of the right foot. Electronic Signature(s) Signed: 04/25/2015 10:46:45 AM By: Christin Fudge MD, FACS Entered By: Christin Fudge on 04/25/2015 10:46:40 Philip [name] ([id number]) -------------------------------------------------------------------------------- HPI Details Patient Name: Philip [name], Philip [name]. Date of Service: 04/25/2015 9:30 AM Medical Record Number: [id number] Patient Account Number: [id number] Date of Birth/Sex: 1919-03-27 (82 y.o. Male) Treating RN: Montey Hora Primary Care Physician: Hortencia Pilar Other Clinician: Referring Physician: Hortencia Pilar Treating Physician/Extender: Frann Rider in Treatment: 75 History of Present Illness Location: right leg Duration: Dec 2015 Modifying Factors: history of an injury to the right leg with resulting hematoma and thrombophlebitis and later an ulcer of posterior leg Associated Signs and Symptoms: marked lymphedema of the right leg. He is already on Eloquis. HPI Description: 06/21/14 -- after he sustained a fall this week earlier he applied a bandage over this himself and did not seek any medical attention. he did however manage to control the bleeding and had a dressing in place the next morning when his son to the visit. In this dressing was removed there was  further damaged skin. His right leg has been doing fine otherwise. 07/12/14 --Very pleasant 82 year old with past medical history significant for congestive heart failure (EF 15%), peripheral vascular disease, and chronic kidney disease. He was hospitalized at [facility] in December 2015 for congestive heart failure. He says that he fell during his hospital course and developed a hematoma over his right calf. He was also diagnosed with a right lower extremity DVT for which he takes Eliquis. The hematoma subsequently turned into an ulceration around Christmas, which has healed. He subsequently developed an ulcer on his right dorsal foot and a traumatic left forearm ulcer. Per his report, he underwent biopsy of the right dorsal foot ulceration which demonstrated a skin cancer. His PCP and dermatologist office are both closed today. I reviewed his records in [place] but find no report of biopsy or pathology. He s without complaints today. No significant pain. No fever or chills. Minimal drainage. 07/19/2014 - the patient and his son tell me that about 2 weeks ago the dermatologist did a skin biopsy and this was a large area on the dorsum of his right foot which was left open and no dressing instructions were recommended. Since then he has been called and told that it is a cancer and the need to do a further procedure but that will not happen until about 2 weeks from now. In the meanwhile the patient has not been taking care of his right foot. The left forearm where he had an abrasion and laceration is doing very well. 07/26/2014 -- Reports from 07/08/2014 from the dermatology group reviewed. A excision was done of a lesion located on the dorsum of the right foot and this was 1.7 cm in diameter which was a shave biopsy performed. The wound was left open after appropriate cauterization and the patient was given this dressing instructions. 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 08/02/2014 -- all the original problems he came with have completely resolved. He now has a surgical wound on his right foot dorsum where a skin cancer was excised. He goes to see his dermatologist this coming Tuesday and will have definite news next Friday. 08/09/2014 he had gone to his dermatologist on Tuesday and she has injected the base of his ulcer with some chemotherapeutic agent. He was supposed to bring some papers with him but forgot to get them and Philip [name], Philip [name]. ([id number]) will bring them in next week. Other than that the dermatologist had suggested using Mehdi honey on the wound. 08/16/2014 -- the patient has brought in his notes from the dermatologist and on 08/06/2014 he received a injection of 5 FU, 500 mg grams into the lesion. the pathology report was also sent and it was a squamous cell carcinoma well-differentiated and edges were involved. They wanted him to use many honey for the wound dressing changes to be done 3 times a week. 08/30/2014 -- he has finished his second injection of 5-FU and has the next one in 2 weeks' time. He is doing fine otherwise. 09/13/2014 - No new complaints. No significant pain. No fever or chills. Minimal drainage. Still receiving 5-FU injections. 09/20/2014 -- He was seen by the dermatologist on 09/10/2014 and Dr. Phillip Heal injected his foot both the right on the dorsum and left near the medial malleolus with 5-FU. The next dose of 5-FU is to be given after 3 months. The patient says he now has a spot on the left medial malleolus where he was injected with 5-FU. 09/27/2014 -- the area on the left ankle where he was injected with 5-FU is now a full-blown ulcer. He also has mild pain in both ankle areas. 10/04/2014 - large had a bit of a fall and injured his right arm last evening and has had a laceration with no evidence of any foreign body in the right forearm. 12/20/2014 -- he  recently saw his dermatologist at [facility] and she was pleased with his wound healing on the right lower extremity. She has not given him any further 5-FU injections and will see him back on a when necessary basis. 01/03/2015 -- his skin substitute Grafix has been approved by his insurance and we will go ahead with this next week. 01/10/2015 -- he has his first application of Grafix today. he recently had a nightmare and injured his right forearm and had some abrasions. 01/13/2015 -- Iona Beard has developed significant swelling of the left calf with some tenderness of the calf. This was only noticed this morning. Addendum: The DVT study done in the hospital -- IMPRESSION:No evidence of deep venous thrombosis left lower extremity. The result was called into the patient's son who acknowledges the report and will bring the patient back on Friday 01/17/2015 -- he saw his PCP Dr. Hoy Morn and she has increases dosage of Lasix and his edema on the left lower extremity is looking much better. He is here for a second applications of Grafix [id number] -- he is overall doing very well his edema has come down significantly. He is here for his third application of grafix. 02/04/2015 -- he has no new issues and is here for his fourth application of grafix [id number] -- he is here for a wound review today and has no fresh issues. 02/21/2015 -- he has no new issues and is here for his  fifth and last application of grafix. 03/21/2015 -- he has had one of the vascular test done last week and is awaiting the second one. Other than that his health has not changed in any way. 03/28/2015 -- we tried hard but no vascular reports are available yet and his next test is on December 29. We've had him authorized for [place], but I'm awaiting his vascular workup and I believe it won't be too early January when we will take a decision regarding this. Philip [name], Philip [name] ([id number]) 04/10/2015 -- he was seen by Dr. Lucky Cowboy this  week and though we do not have official reports from what I understand an arterial procedure is planned for the first week of January to try and improve his circulation in the left lower extremity. 04/25/2015 -- he had a vascular procedure done by Dr. Lucky Cowboy yesterday and the procedure was an aortogram with selective left lower extremity angiogram and a angioplasty of the left peroneal artery and the left popliteal artery and also of the tibioperoneal trunk and proximal peroneal artery. Laterrance has been having severe pain in his left lower extremity since midnight and has not been able to sleep. after examining the patient I was able to establish that he has probably had a heart flow obstruction to his left lower extremity and has significant ischemia of the distal lower leg and foot. He will need emergent intervention by Dr. Leotis Pain and I have got onto the phone spoken to him and he will see him immediately for a duplex study and appropriate management. Electronic Signature(s) Signed: 04/25/2015 10:48:14 AM By: Christin Fudge MD, FACS Previous Signature: 04/25/2015 10:38:50 AM Version By: Christin Fudge MD, FACS Previous Signature: 04/25/2015 10:38:33 AM Version By: Christin Fudge MD, FACS Previous Signature: 04/25/2015 10:25:03 AM Version By: Christin Fudge MD, FACS Entered By: Christin Fudge on 04/25/2015 10:48:13 Philip [name] ([id number]) -------------------------------------------------------------------------------- Physical Exam Details Patient Name: Philip [name], Philip [name]. Date of Service: 04/25/2015 9:30 AM Medical Record Number: [id number] Patient Account Number: [id number] Date of Birth/Sex: 07/02/1918 (82 y.o. Male) Treating RN: Montey Hora Primary Care Physician: Hortencia Pilar Other Clinician: Referring Physician: Hortencia Pilar Treating Physician/Extender: Frann Rider in Treatment: 51 Constitutional . Pulse regular. Respirations normal and unlabored. Afebrile. . Eyes Nonicteric.  Reactive to light. Ears, Nose, Mouth, and Throat Lips, teeth, and gums WNL.Marland Kitchen Moist mucosa without lesions. Neck supple and nontender. No palpable supraclavicular or cervical adenopathy. Normal sized without goiter. Respiratory WNL. No retractions.. Cardiovascular Pedal Pulses WNL. no Doppler signal left ankle pulses. he has cyanosis of his left lower extremity from the lower third down to his toes. Lymphatic No adneopathy. No adenopathy. No adenopathy. Musculoskeletal Adexa without tenderness or enlargement.. Digits and nails w/o clubbing, cyanosis, infection, petechiae, ischemia, or inflammatory conditions.. Integumentary (Hair, Skin) No suspicious lesions. No crepitus or fluctuance. No peri-wound warmth or erythema. No masses.Marland Kitchen Psychiatric Judgement and insight Intact.. No evidence of depression, anxiety, or agitation.. Notes he is status post left lower extremity angioplasty and now has significant ischemia of the distal third of his left lower extremity and foot. He will need urgent vascular intervention and appropriate arrangements are being made Electronic Signature(s) Signed: 04/25/2015 10:49:31 AM By: Christin Fudge MD, FACS Entered By: Christin Fudge on 04/25/2015 10:49:31 Philip [name] ([id number]) -------------------------------------------------------------------------------- Physician Orders Details Patient Name: Philip [name], Philip [name]. Date of Service: 04/25/2015 9:30 AM Medical Record Number: [id number] Patient Account Number: [id number] Date of Birth/Sex: Jul 26, 1918 (82 y.o. Male) Treating RN: Montey Hora Primary Care Physician: Hoy Morn,  Heidi Other Clinician: Referring Physician: Hortencia Pilar Treating Physician/Extender: Frann Rider in Treatment: 31 Verbal / Phone Orders: Yes Clinician: Montey Hora Read Back and Verified: Yes Diagnosis Coding ICD-10 Coding Code Description [phone] Atherosclerosis of native arteries of right leg with ulceration of  calf I82.401 Acute embolism and thrombosis of unspecified deep veins of right lower extremity Z92.21 Personal history of antineoplastic chemotherapy L97.522 Non-pressure chronic ulcer of other part of left foot with fat layer exposed S41.111A Laceration without foreign body of right upper arm, initial encounter Wound Cleansing Wound #4 Left,Medial Malleolus o Cleanse wound with mild soap and water Wound #5R Right,Lateral Forearm o Cleanse wound with mild soap and water Anesthetic Wound #4 Left,Medial Malleolus o Topical Lidocaine 4% cream applied to wound bed prior to debridement Wound #5R Right,Lateral Forearm o Topical Lidocaine 4% cream applied to wound bed prior to debridement Primary Wound Dressing Wound #4 Left,Medial Malleolus o Saline moistened gauze - in wound clinic today only o Hydrafera Blue - please cut hydrofera blue slightly smaller than wound and cut slits in the shinny side so drainage can come through the to secondary bandage Wound #5R Right,Lateral Forearm o Other: - Mepilex Lite with nystatin powder Secondary Dressing Wound #4 Left,Medial Malleolus o Boardered Foam Dressing Philip [name], Philip [name] ([id number]) Wound #5R Right,Lateral Forearm o Conform/Kerlix - coban lightly to secure o XtraSorb Dressing Change Frequency Wound #4 Left,Medial Malleolus o Three times weekly Wound #5R Right,Lateral Forearm o Change dressing every week Follow-up Appointments Wound #4 Left,Medial Malleolus o Return Appointment in 1 week. Wound #5R Right,Lateral Forearm o Return Appointment in 1 week. Home Health Wound #4 [place] Visits - [place] Nurse may visit PRN to address patientos wound care needs. o FACE TO FACE ENCOUNTER: MEDICARE and MEDICAID PATIENTS: I certify that this patient is under my care and that I had a face-to-face encounter that meets the physician face-to-face encounter  requirements with this patient on this date. The encounter with the patient was in whole or in part for the following MEDICAL CONDITION: (primary reason for [place]) MEDICAL NECESSITY: I certify, that based on my findings, NURSING services are a medically necessary home health service. HOME BOUND STATUS: I certify that my clinical findings support that this patient is homebound (i.e., Due to illness or injury, pt requires aid of supportive devices such as crutches, cane, wheelchairs, walkers, the use of special transportation or the assistance of another person to leave their place of residence. There is a normal inability to leave the home and doing so requires considerable and taxing effort. Other absences are for medical reasons / religious services and are infrequent or of short duration when for other reasons). o If current dressing causes regression in wound condition, may D/C ordered dressing product/s and apply Normal Saline Moist Dressing daily until next [place] / Other MD appointment. [place] of regression in wound condition at [phone]. o Please direct any NON-WOUND related issues/requests for orders to patient's Primary Care Physician Wound #5R Right,Lateral Forearm o [place] Visits - [place] Nurse may visit PRN to address patientos wound care needs. o FACE TO FACE ENCOUNTER: MEDICARE and MEDICAID PATIENTS: I certify that this patient is under my care and that I had a face-to-face encounter that meets the physician face-to-face encounter requirements with this patient on this date. The encounter with the patient was in whole or in part for the following MEDICAL CONDITION: (  primary reason for [place]) Philip [name], Philip [name] ([id number]) MEDICAL NECESSITY: I certify, that based on my findings, NURSING services are a medically necessary home health service. HOME BOUND STATUS: I certify that my  clinical findings support that this patient is homebound (i.e., Due to illness or injury, pt requires aid of supportive devices such as crutches, cane, wheelchairs, walkers, the use of special transportation or the assistance of another person to leave their place of residence. There is a normal inability to leave the home and doing so requires considerable and taxing effort. Other absences are for medical reasons / religious services and are infrequent or of short duration when for other reasons). o If current dressing causes regression in wound condition, may D/C ordered dressing product/s and apply Normal Saline Moist Dressing daily until next [place] / Other MD appointment. [place] of regression in wound condition at [phone]. o Please direct any NON-WOUND related issues/requests for orders to patient's Primary Care Physician Notes Patient to go to Dr Bunnie Domino office now Electronic Signature(s) Signed: 04/25/2015 12:03:35 PM By: Montey Hora Signed: 04/25/2015 5:05:49 PM By: Christin Fudge MD, FACS Previous Signature: 04/25/2015 12:01:54 PM Version By: Montey Hora Entered By: Montey Hora on 04/25/2015 12:03:34 Philip [name] ([id number]) -------------------------------------------------------------------------------- Problem List Details Patient Name: Philip [name], Philip [name]. Date of Service: 04/25/2015 9:30 AM Medical Record Number: [id number] Patient Account Number: [id number] Date of Birth/Sex: Nov 29, 1918 (82 y.o. Male) Treating RN: Montey Hora Primary Care Physician: Hortencia Pilar Other Clinician: Referring Physician: Hortencia Pilar Treating Physician/Extender: Frann Rider in Treatment: 66 Active Problems ICD-10 Encounter Code Description Active Date Diagnosis I70.232 Atherosclerosis of native arteries of right leg with 06/21/2014 Yes ulceration of calf I82.401 Acute embolism and thrombosis of unspecified deep veins 06/21/2014 Yes of  right lower extremity Z92.21 Personal history of antineoplastic chemotherapy 08/23/2014 Yes L97.522 Non-pressure chronic ulcer of other part of left foot with fat 09/20/2014 Yes layer exposed S41.111A Laceration without foreign body of right upper arm, initial 10/04/2014 Yes encounter Inactive Problems Resolved Problems ICD-10 Code Description Active Date Resolved Date L97.212 Non-pressure chronic ulcer of right calf with fat layer 06/21/2014 06/21/2014 exposed S51.812A Laceration without foreign body of left forearm, initial 06/21/2014 06/21/2014 encounter S91.301A Unspecified open wound, right foot, initial encounter 07/19/2014 07/19/2014 Philip [name] ([id number]) Electronic Signature(s) Signed: 04/25/2015 10:46:27 AM By: Christin Fudge MD, FACS Entered By: Christin Fudge on 04/25/2015 10:46:27 Philip [name] ([id number]) -------------------------------------------------------------------------------- Progress Note Details Patient Name: Philip [name]. Date of Service: 04/25/2015 9:30 AM Medical Record Number: [id number] Patient Account Number: [id number] Date of Birth/Sex: Mar 25, 1919 (82 y.o. Male) Treating RN: Montey Hora Primary Care Physician: Hortencia Pilar Other Clinician: Referring Physician: Hortencia Pilar Treating Physician/Extender: Frann Rider in Treatment: 56 Subjective Chief Complaint Information obtained from Patient R foot ulcer. L forearm ulcer. 07/19/2014 -- about 2 weeks ago he had a surgical procedure done by dermatologist in [place] and has an open surgical wound on the dorsum of the right foot. History of Present Illness (HPI) The following HPI elements were documented for the patient's wound: Location: right leg Duration: Dec 2015 Modifying Factors: history of an injury to the right leg with resulting hematoma and thrombophlebitis and later an ulcer of posterior leg Associated Signs and Symptoms: marked lymphedema of the right leg. He is already on  Eloquis. 06/21/14 -- after he sustained a fall this week earlier he applied a bandage over this himself and did not seek any medical attention. he did however manage  to control the bleeding and had a dressing in place the next morning when his son to the visit. In this dressing was removed there was further damaged skin. His right leg has been doing fine otherwise. 07/12/14 --Very pleasant 82 year old with past medical history significant for congestive heart failure (EF 15%), peripheral vascular disease, and chronic kidney disease. He was hospitalized at [facility] in December 2015 for congestive heart failure. He says that he fell during his hospital course and developed a hematoma over his right calf. He was also diagnosed with a right lower extremity DVT for which he takes Eliquis. The hematoma subsequently turned into an ulceration around Christmas, which has healed. He subsequently developed an ulcer on his right dorsal foot and a traumatic left forearm ulcer. Per his report, he underwent biopsy of the right dorsal foot ulceration which demonstrated a skin cancer. His PCP and dermatologist office are both closed today. I reviewed his records in [place] but find no report of biopsy or pathology. He s without complaints today. No significant pain. No fever or chills. Minimal drainage. 07/19/2014 - the patient and his son tell me that about 2 weeks ago the dermatologist did a skin biopsy and this was a large area on the dorsum of his right foot which was left open and no dressing instructions were recommended. Since then he has been called and told that it is a cancer and the need to do a further procedure but that will not happen until about 2 weeks from now. In the meanwhile the patient has not been taking care of his right foot. The left forearm where he had an abrasion and laceration is doing very well. 07/26/2014 -- Reports from 07/08/2014 from the dermatology group reviewed. A excision was done of  a lesion located on the dorsum of the right foot and this was 1.7 cm in diameter which was a shave biopsy performed. The wound was left open after appropriate cauterization and the patient was given this dressing UTAH, WELDEN. ([id number]) instructions. 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 08/02/2014 -- all the original problems he came with have completely resolved. He now has a surgical wound on his right foot dorsum where a skin cancer was excised. He goes to see his dermatologist this coming Tuesday and will have definite news next Friday. 08/09/2014 he had gone to his dermatologist on Tuesday and she has injected the base of his ulcer with some chemotherapeutic agent. He was supposed to bring some papers with him but forgot to get them and will bring them in next week. Other than that the dermatologist had suggested using Mehdi honey on the wound. 08/16/2014 -- the patient has brought in his notes from the dermatologist and on 08/06/2014 he received a injection of 5 FU, 500 mg grams into the lesion. the pathology report was also sent and it was a squamous cell carcinoma well-differentiated and edges were involved. They wanted him to use many honey for the wound dressing changes to be done 3 times a week. 08/30/2014 -- he has finished his second injection of 5-FU and has the next one in 2 weeks' time. He is doing fine otherwise. 09/13/2014 - No new complaints. No significant pain. No fever or chills. Minimal drainage. Still receiving 5-FU injections. 09/20/2014 -- He was seen by the dermatologist on 09/10/2014 and Dr. Phillip Heal injected his foot both the right on the dorsum and left near  the medial malleolus with 5-FU. The next dose of 5-FU is to be given after 3 months. The patient says he now has a spot on the left medial malleolus where he was injected with 5-FU. 09/27/2014 -- the area on  the left ankle where he was injected with 5-FU is now a full-blown ulcer. He also has mild pain in both ankle areas. 10/04/2014 - large had a bit of a fall and injured his right arm last evening and has had a laceration with no evidence of any foreign body in the right forearm. 12/20/2014 -- he recently saw his dermatologist at [facility] and she was pleased with his wound healing on the right lower extremity. She has not given him any further 5-FU injections and will see him back on a when necessary basis. 01/03/2015 -- his skin substitute Grafix has been approved by his insurance and we will go ahead with this next week. 01/10/2015 -- he has his first application of Grafix today. he recently had a nightmare and injured his right forearm and had some abrasions. 01/13/2015 -- Iona Beard has developed significant swelling of the left calf with some tenderness of the calf. This was only noticed this morning. Addendum: The DVT study done in the hospital -- IMPRESSION:No evidence of deep venous thrombosis left lower extremity. The result was called into the patient's son who acknowledges the report and will bring the patient back on Friday 01/17/2015 -- he saw his PCP Dr. Hoy Morn and she has increases dosage of Lasix and his edema on the left lower extremity is looking much better. He is here for a second applications of Grafix [id number] -- he is overall doing very well his edema has come down significantly. He is here for his third application of grafix. Philip [name], GANE ([id number]) 02/04/2015 -- he has no new issues and is here for his fourth application of grafix [id number] -- he is here for a wound review today and has no fresh issues. 02/21/2015 -- he has no new issues and is here for his fifth and last application of grafix. 03/21/2015 -- he has had one of the vascular test done last week and is awaiting the second one. Other than that his health has not changed in any way. 03/28/2015 -- we  tried hard but no vascular reports are available yet and his next test is on December 29. We've had him authorized for [place], but I'm awaiting his vascular workup and I believe it won't be too early January when we will take a decision regarding this. 04/10/2015 -- he was seen by Dr. Lucky Cowboy this week and though we do not have official reports from what I understand an arterial procedure is planned for the first week of January to try and improve his circulation in the left lower extremity. 04/25/2015 -- he had a vascular procedure done by Dr. Lucky Cowboy yesterday and the procedure was an aortogram with selective left lower extremity angiogram and a angioplasty of the left peroneal artery and the left popliteal artery and also of the tibioperoneal trunk and proximal peroneal artery. Lamor has been having severe pain in his left lower extremity since midnight and has not been able to sleep. after examining the patient I was able to establish that he has probably had a heart flow obstruction to his left lower extremity and has significant ischemia of the distal lower leg and foot. He will need emergent intervention by Dr. Leotis Pain and I have got onto the phone spoken to him  and he will see him immediately for a duplex study and appropriate management. Objective Constitutional Pulse regular. Respirations normal and unlabored. Afebrile. Vitals Time Taken: 10:12 AM, Height: 69 in, Weight: 179 lbs, BMI: 26.4, Temperature: 97.9 F, Pulse: 57 bpm, Respiratory Rate: 16 breaths/min, Blood Pressure: 114/57 mmHg. Eyes Nonicteric. Reactive to light. Ears, Nose, Mouth, and Throat Lips, teeth, and gums WNL.Marland Kitchen Moist mucosa without lesions. Neck supple and nontender. No palpable supraclavicular or cervical adenopathy. Normal sized without goiter. Respiratory WNL. No retractions.. Cardiovascular JERRIMIAH, POWELL ([id number]) Pedal Pulses WNL. no Doppler signal left ankle pulses. he has cyanosis of his left  lower extremity from the lower third down to his toes. Lymphatic No adneopathy. No adenopathy. No adenopathy. Musculoskeletal Adexa without tenderness or enlargement.. Digits and nails w/o clubbing, cyanosis, infection, petechiae, ischemia, or inflammatory conditions.Marland Kitchen Psychiatric Judgement and insight Intact.. No evidence of depression, anxiety, or agitation.. General Notes: he is status post left lower extremity angioplasty and now has significant ischemia of the distal third of his left lower extremity and foot. He will need urgent vascular intervention and appropriate arrangements are being made Integumentary (Hair, Skin) No suspicious lesions. No crepitus or fluctuance. No peri-wound warmth or erythema. No masses.. Wound #4 status is Open. Original cause of wound was Other Lesion. The wound is located on the Left,Medial Malleolus. The wound measures 3.3cm length x 2cm width x 0.3cm depth; 5.184cm^2 area and 1.555cm^3 volume. The wound is limited to skin breakdown. There is no tunneling or undermining noted. There is a large amount of serous drainage noted. The wound margin is distinct with the outline attached to the wound base. There is medium (34-66%) pink granulation within the wound bed. There is a medium (34- 66%) amount of necrotic tissue within the wound bed including Adherent Slough. The periwound skin appearance exhibited: Localized Edema, Maceration, Moist, Cyanosis, Hemosiderin Staining, Erythema. The surrounding wound skin color is noted with erythema which is circumferential. Periwound temperature was noted as No Abnormality. The periwound has tenderness on palpation. Wound #5R status is Open. Original cause of wound was Shear/Friction. The wound is located on the Right,Lateral Forearm. The wound measures 2.3cm length x 2.8cm width x 0.1cm depth; 5.058cm^2 area and 0.506cm^3 volume. The wound is limited to skin breakdown. There is no tunneling or undermining noted. There  is a large amount of serosanguineous drainage noted. The wound margin is flat and intact. There is large (67-100%) red, pink granulation within the wound bed. There is no necrotic tissue within the wound bed. The periwound skin appearance exhibited: Localized Edema, Moist. The periwound skin appearance did not exhibit: Callus, Crepitus, Excoriation, Fluctuance, Friable, Induration, Rash, Scarring, Dry/Scaly, Maceration, Atrophie Blanche, Cyanosis, Ecchymosis, Hemosiderin Staining, Mottled, Pallor, Rubor, Erythema. Periwound temperature was noted as No Abnormality. 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 CHOL, GRAFFIUS. ([id number]) I82.401 - Acute embolism and thrombosis of unspecified deep veins of right lower extremity Z92.21 - Personal history of antineoplastic chemotherapy L97.522 - Non-pressure chronic ulcer of other part of left foot with fat layer exposed S41.111A - Laceration without foreign body of right upper arm, initial encounter The patient did very well initially after his left lower extremity angioplasty but now has developed ischemia over the last several hours. He will need urgent intervention and I have spoken to his vascular surgeon Dr. Lucky Cowboy who will see him emergently in the office and do the appropriate procedure. Patient and his son understand the severity  of the issue and will go right away to see Dr. Lucky Cowboy Plan Wound Cleansing: Wound #4 Left,Medial Malleolus: Cleanse wound with mild soap and water Wound #5R Right,Lateral Forearm: Cleanse wound with mild soap and water Anesthetic: Wound #4 Left,Medial Malleolus: Topical Lidocaine 4% cream applied to wound bed prior to debridement Wound #5R Right,Lateral Forearm: Topical Lidocaine 4% cream applied to wound bed prior to debridement Primary Wound Dressing: Wound #4 Left,Medial Malleolus: Saline moistened gauze - in wound clinic today  only Hydrafera Blue - please cut hydrofera blue slightly smaller than wound and cut slits in the shinny side so drainage can come through the to secondary bandage Wound #5R Right,Lateral Forearm: Other: - Mepilex Lite with nystatin powder Secondary Dressing: Wound #4 Left,Medial Malleolus: Boardered Foam Dressing Wound #5R Right,Lateral Forearm: Conform/Kerlix - coban lightly to secure XtraSorb Dressing Change Frequency: Wound #4 Left,Medial Malleolus: Three times weekly Wound #5R Right,Lateral Forearm: Change dressing every week Follow-up Appointments: Wound #4 Left,Medial Malleolus: Return Appointment in 1 week. THEDFORD, LOVETTE ([id number]) Wound #5R Right,Lateral Forearm: Return Appointment in 1 week. Home Health: Wound #4 Left,Medial Malleolus: Continue Home Health Visits - [place] Nurse may visit PRN to address patient s wound care needs. FACE TO FACE ENCOUNTER: MEDICARE and MEDICAID PATIENTS: I certify that this patient is under my care and that I had a face-to-face encounter that meets the physician face-to-face encounter requirements with this patient on this date. The encounter with the patient was in whole or in part for the following MEDICAL CONDITION: (primary reason for [place]) MEDICAL NECESSITY: I certify, that based on my findings, NURSING services are a medically necessary home health service. HOME BOUND STATUS: I certify that my clinical findings support that this patient is homebound (i.e., Due to illness or injury, pt requires aid of supportive devices such as crutches, cane, wheelchairs, walkers, the use of special transportation or the assistance of another person to leave their place of residence. There is a normal inability to leave the home and doing so requires considerable and taxing effort. Other absences are for medical reasons / religious services and are infrequent or of short duration when for other reasons). If current dressing  causes regression in wound condition, may D/C ordered dressing product/s and apply Normal Saline Moist Dressing daily until next [place] / Other MD appointment. [place] of regression in wound condition at [phone]. Please direct any NON-WOUND related issues/requests for orders to patient's Primary Care Physician Wound #5R Right,Lateral Forearm: [place] Nurse may visit PRN to address patient s wound care needs. FACE TO FACE ENCOUNTER: MEDICARE and MEDICAID PATIENTS: I certify that this patient is under my care and that I had a face-to-face encounter that meets the physician face-to-face encounter requirements with this patient on this date. The encounter with the patient was in whole or in part for the following MEDICAL CONDITION: (primary reason for [place]) MEDICAL NECESSITY: I certify, that based on my findings, NURSING services are a medically necessary home health service. HOME BOUND STATUS: I certify that my clinical findings support that this patient is homebound (i.e., Due to illness or injury, pt requires aid of supportive devices such as crutches, cane, wheelchairs, walkers, the use of special transportation or the assistance of another person to leave their place of residence. There is a normal inability to leave the home and doing so requires considerable and taxing effort. Other absences are for medical reasons /  religious services and are infrequent or of short duration when for other reasons). If current dressing causes regression in wound condition, may D/C ordered dressing product/s and apply Normal Saline Moist Dressing daily until next [place] / Other MD appointment. [place] of regression in wound condition at [phone]. Please direct any NON-WOUND related issues/requests for orders to patient's Primary Care Physician General Notes: Patient to go to Dr  Bunnie Domino office now The patient did very well initially after his left lower extremity angioplasty but now has developed ischemia over the last several hours. He will need urgent intervention and I have spoken to his vascular surgeon Dr. Lucky Cowboy who will see him emergently in the office and do the appropriate procedure. Patient and his son understand the severity of the issue and will go right away to see Dr. Omer Jack, [place] ([id number]) Electronic Signature(s) Signed: 04/25/2015 5:01:40 PM By: Christin Fudge MD, FACS Previous Signature: 04/25/2015 10:50:59 AM Version By: Christin Fudge MD, FACS Entered By: Christin Fudge on 04/25/2015 17:01:40 Philip [name] ([id number]) -------------------------------------------------------------------------------- SuperBill Details Patient Name: SADARIUS, KRIEGER. Date of Service: 04/25/2015 Medical Record Number: [id number] Patient Account Number: [id number] Date of Birth/Sex: 1918/11/15 (82 y.o. Male) Treating RN: Montey Hora Primary Care Physician: Hortencia Pilar Other Clinician: Referring Physician: Hortencia Pilar Treating Physician/Extender: Frann Rider in Treatment: 77 Diagnosis Coding ICD-10 Codes Code Description [phone] 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 I82.401 Acute embolism and thrombosis of unspecified deep veins of right lower extremity Z92.21 Personal history of antineoplastic chemotherapy L97.522 Non-pressure chronic ulcer of other part of left foot with fat layer exposed S41.111A Laceration without foreign body of right upper arm, initial encounter Physician Procedures CPT4: Description Modifier Quantity Code [id number] - WC PHYS LEVEL 4 - EST PT 1 ICD-10 Description Diagnosis I70.232 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 I82.401 Acute embolism and thrombosis of unspecified deep  veins of right lower extremity Z92.21 Personal history of antineoplastic  chemotherapy L97.522 Non-pressure chronic ulcer of other part of left foot with fat layer exposed Electronic Signature(s) Signed: 04/25/2015 10:51:54 AM By: Christin Fudge MD, FACS Entered By: Christin Fudge on 04/25/2015 10:51:54

## 2015-04-28 NOTE — Care Management (Addendum)
Patient with limb threatening ischemia and does not wish to pursue additional treatment/amputation.  Patient and her family who are at bedside wish for patient to return home with hospice services.  Agency preference is [facility].  Will need hospital bed for sure.  Family wish to hire round the clock "nurses" and understand that this is not provided by hospice agency.  Patient has long term care policy.  Will provide family with list of agencies that can provide continuous in home services

## 2015-04-28 NOTE — Progress Notes (Signed)
[facility]  Daily Progress Note   Subjective  - 3 Days Post-Op  vicodin controlling pain.  He is comfortable, awake and alert Does not want amputation  Objective Filed Vitals:   04/28/15 0700 04/28/15 0757 04/28/15 0800 04/28/15 0900  BP: 109/41  105/50 96/50  Pulse: 61  40 45  Temp:  98.2 F (36.8 C)    TempSrc:  Axillary    Resp: 17  17 17   Height:      Weight:      SpO2: 100%  99% 98%    Intake/Output Summary (Last 24 hours) at 04/28/15 0958 Last data filed at 04/28/15 0900  Gross per 24 hour  Intake    733 ml  Output    300 ml  Net    433 ml    PULM  CTAB CV  Irregularly irregular VASC  Foot is cool, pulseless.  Cyanotic and irreversible skin changes to just above the ankle on the left  Laboratory CBC    Component Value Date/Time   WBC 15.5* 04/28/2015 0442   HGB 11.0* 04/28/2015 0442   HCT 33.3* 04/28/2015 0442   PLT 370 04/28/2015 0442    BMET    Component Value Date/Time   NA 138 04/28/2015 0442   K 4.1 04/28/2015 0442   CL 104 04/28/2015 0442   CO2 28 04/28/2015 0442   GLUCOSE 94 04/28/2015 0442   BUN 39* 04/28/2015 0442   CREATININE 2.14* 04/28/2015 0442   CALCIUM 8.3* 04/28/2015 0442   GFRNONAA 25* 04/28/2015 0442   GFRAA 29* 04/28/2015 0442    Assessment/Planning: POD #3 and 4 s/p LLE angiogram with intervention   Foot and ankle and clearly ischemic, and no other good options for revascularization exist  He is very lucid and comfortable currently  He is adamant that he does not want an amputation  He understands that this will likely lead to death within a matter of a couple weeks and he is comfortable with that.  Will ask Palliative care to set up Hospice arrangements and plan for home hospice at discharge in next day or so    Philip [name]  04/28/2015, 9:58 AM

## 2015-04-29 DIAGNOSIS — Z87891 Personal history of nicotine dependence: Secondary | ICD-10-CM

## 2015-04-29 DIAGNOSIS — H9190 Unspecified hearing loss, unspecified ear: Secondary | ICD-10-CM

## 2015-04-29 DIAGNOSIS — D759 Disease of blood and blood-forming organs, unspecified: Secondary | ICD-10-CM

## 2015-04-29 DIAGNOSIS — E872 Acidosis: Secondary | ICD-10-CM

## 2015-04-29 DIAGNOSIS — I493 Ventricular premature depolarization: Secondary | ICD-10-CM

## 2015-04-29 DIAGNOSIS — K5900 Constipation, unspecified: Secondary | ICD-10-CM

## 2015-04-29 DIAGNOSIS — I998 Other disorder of circulatory system: Secondary | ICD-10-CM

## 2015-04-29 DIAGNOSIS — K219 Gastro-esophageal reflux disease without esophagitis: Secondary | ICD-10-CM

## 2015-04-29 DIAGNOSIS — Z86718 Personal history of other venous thrombosis and embolism: Secondary | ICD-10-CM

## 2015-04-29 DIAGNOSIS — Z515 Encounter for palliative care: Secondary | ICD-10-CM

## 2015-04-29 DIAGNOSIS — I739 Peripheral vascular disease, unspecified: Secondary | ICD-10-CM

## 2015-04-29 DIAGNOSIS — Z66 Do not resuscitate: Secondary | ICD-10-CM

## 2015-04-29 LAB — CBC
HEMATOCRIT: 34.1 % — AB (ref 40.0–52.0)
HEMOGLOBIN: 11 g/dL — AB (ref 13.0–18.0)
MCH: 30 pg (ref 26.0–34.0)
MCHC: 32.4 g/dL (ref 32.0–36.0)
MCV: 92.7 fL (ref 80.0–100.0)
Platelets: 403 10*3/uL (ref 150–440)
RBC: 3.68 MIL/uL — AB (ref 4.40–5.90)
RDW: 13.2 % (ref 11.5–14.5)
WBC: 16.2 10*3/uL — AB (ref 3.8–10.6)

## 2015-04-29 LAB — HEPARIN LEVEL (UNFRACTIONATED): HEPARIN UNFRACTIONATED: 0.38 [IU]/mL (ref 0.30–0.70)

## 2015-04-29 MED ORDER — FLEET ENEMA 7-19 GM/118ML RE ENEM
1.0000 | ENEMA | Freq: Once | RECTAL | Status: AC
  Administered 2015-04-29: 12:00:00 1 via RECTAL

## 2015-04-29 MED ORDER — HYDROCODONE-ACETAMINOPHEN 5-325 MG PO TABS: 2.0000 | ORAL_TABLET | ORAL | Status: AC | PRN

## 2015-04-29 MED ORDER — HYDROCODONE-ACETAMINOPHEN 5-325 MG PO TABS: 2.0000 | ORAL_TABLET | ORAL | Status: DC | PRN

## 2015-04-29 MED ORDER — SENNA 8.6 MG PO TABS: 1.0000 | ORAL_TABLET | Freq: Every day | ORAL | Status: AC

## 2015-04-29 MED ORDER — HYDROCODONE-ACETAMINOPHEN 5-325 MG PO TABS: 1.0000 | ORAL_TABLET | ORAL | Status: DC | PRN

## 2015-04-29 MED ORDER — LORAZEPAM 0.5 MG PO TABS: 0.5000 mg | ORAL_TABLET | ORAL | Status: AC | PRN

## 2015-04-29 MED ORDER — BISACODYL 10 MG RE SUPP: 10.0000 mg | RECTAL | Status: AC | PRN

## 2015-04-29 NOTE — Progress Notes (Signed)
ANTICOAGULATION CONSULT NOTE -FOLLOW UP  Pharmacy Consult for Heparin Indication: Ischemia left lower extremity  Allergies  Allergen Reactions  . Penapar-Vk [Penicillin V] Nausea And Vomiting  . Sulfa Antibiotics Nausea And Vomiting    Patient Measurements: Height: 5\' 9"  (175.3 cm) Weight: 172 lb (78.019 kg) IBW/kg (Calculated) : 70.7 Heparin Dosing Weight: 77.7 kg  Vital Signs: Temp: 97.8 F (36.6 C) (01/10 0457) Temp Source: Oral (01/10 0457) BP: 117/64 mmHg (01/10 0457) Pulse Rate: 56 (01/10 0457)  Labs:  Recent Labs  04/27/15 0714 04/27/15 1517 04/28/15 0442 04/28/15 1429 04/29/15 0614  HGB 11.3*  --  11.0*  --   --   HCT 34.5*  --  33.3*  --   --   PLT 364  --  370  --   --   APTT 77* 88* 114* 78*  --   HEPARINUNFRC  --  0.95* 0.81*  --  0.38  CREATININE 2.01*  --  2.14*  --   --     Estimated Creatinine Clearance: 21.1 mL/min (by C-G formula based on Cr of 2.14).   Medical History: Past Medical History  Diagnosis Date  . Peripheral vascular disease ([place])   . CHF (congestive heart failure) ([place])   . Cancer ([place])   . GERD (gastroesophageal reflux disease)   . Blood dyscrasia   . DVT (deep venous thrombosis) [facility])     Assessment: 82 yo male with PVD and ischemia of left lower extremity s/p intervention starting on heparin drip. Pt was on apixaban during this hospitalization.  Goal of Therapy:  Heparin level 0.3-0.7 units/ml Monitor platelets by anticoagulation protocol: Yes   Plan:  Heparin level now within goal range so no need for further aPTTs. Will continue heparin drip at 1000 units/hr. Will check a confirmatory HL in 8 hours.  Ulice Dash, PharmD Clinical Pharmacist  04/29/2015 8:22 AM

## 2015-04-29 NOTE — Progress Notes (Signed)
[facility]  Daily Progress Note   Subjective  - 4 Days Post-Op  Pain well controlled.  No new events  Objective Filed Vitals:   04/28/15 1548 04/28/15 1657 04/28/15 2132 04/29/15 0457  BP: 119/58  101/56 117/64  Pulse: 56  49 56  Temp: 97.8 F (36.6 C)  97.7 F (36.5 C) 97.8 F (36.6 C)  TempSrc: Oral  Oral Oral  Resp: 18  18 18   Height: 5\' 9"  (1.753 m)     Weight: 78.4 kg (172 lb 13.5 oz)   78.019 kg (172 lb)  SpO2: 99% 97% 96% 98%    Intake/Output Summary (Last 24 hours) at 04/29/15 1011 Last data filed at 04/28/15 1950  Gross per 24 hour  Intake    240 ml  Output    250 ml  Net    -10 ml    PULM  CTAB CV  RRR VASC  Left foot ischemic, irreversible skin changes  Laboratory CBC    Component Value Date/Time   WBC 16.2* 04/29/2015 0614   HGB 11.0* 04/29/2015 0614   HCT 34.1* 04/29/2015 0614   PLT 403 04/29/2015 0614    BMET    Component Value Date/Time   NA 138 04/28/2015 0442   K 4.1 04/28/2015 0442   CL 104 04/28/2015 0442   CO2 28 04/28/2015 0442   GLUCOSE 94 04/28/2015 0442   BUN 39* 04/28/2015 0442   CREATININE 2.14* 04/28/2015 0442   CALCIUM 8.3* 04/28/2015 0442   GFRNONAA 25* 04/28/2015 0442   GFRAA 29* 04/28/2015 0442    Assessment/Planning: POD #4 and 5 s/p attempts at left lower extremity revascularization   Left foot is irreversibly ischemic, and he has declined amputation  Home with home hospice today if arrangements can be made    Roxane Puerto  04/29/2015, 10:11 AM

## 2015-04-29 NOTE — Consult Note (Signed)
Palliative Medicine Inpatient Consult Note   Name: Philip [name] Date: 04/29/2015 MRN: [id number]  DOB: [birth year]  Referring Physician: Algernon Huxley, MD  Palliative Care consult requested for this 82 y.o. male for goals of medical therapy in patient with an ischemic  lower extremity.    TODAY'S DISCUSSIONS AND DECISIONS:  It had already been decided that pt was appropriate to go home with Hospice.  Hospice is already involved. There was a Palliative Care consult requested and at first I thought perhaps this was not needed, but nursing mentioned to me that pts planned DC meds were less than optimal b/c he has such a problem with constipation and needed more symptom RXs.    Pt had an enema today and had only a small BM    I have added Senna at night and a prn dulcolax supp. I also added prn Ativan at daughter's request.  They spoke of him needing two vicodins every 4 hrs routinely. This is ordered prn.  He may need to change over to morphine sooner than later and he might benefit soon from a long acting RX as well if his pain is severe enough to need this much vicodin.  Discussed w/ family and Hospice Liaison.  DC med recs redona and RXs signed and placed in DC packet.   ------------------------------------------------------  IMPRESSION ischemic foot --nothing more to be done --elected not to have surgery /amputation Ventricular ectopy Metabolic acidosis GERD History of CHF (not now and not known what type) H/O DVT Hard of Hearing   REVIEW OF SYSTEMS:  Patient is not able to provide ROS in detail due to some confusion (mild)  SPIRITUAL SUPPORT SYSTEM: Yes --family.  SOCIAL HISTORY:  reports that he has quit smoking. His smoking use included Cigars. He does not have any smokeless tobacco history on file. He reports that he does not drink alcohol.  LEGAL DOCUMENTS:  Portable DNR form  CODE STATUS: DNR  PAST MEDICAL HISTORY: Past Medical History  Diagnosis Date  .  Peripheral vascular disease ([place])   . CHF (congestive heart failure) ([place])   . Cancer ([place])   . GERD (gastroesophageal reflux disease)   . Blood dyscrasia   . DVT (deep venous thrombosis) ([place])     PAST SURGICAL HISTORY:  Past Surgical History  Procedure Laterality Date  . Breast surgery    . Hernia repair    . Peripheral vascular catheterization Left 04/24/2015    Procedure: Lower Extremity Angiography;  Surgeon: Algernon Huxley, MD;  Location: [place] [facility];  Service: Cardiovascular;  Laterality: Left;  . Peripheral vascular catheterization Left 04/24/2015    Procedure: Lower Extremity Intervention;  Surgeon: Algernon Huxley, MD;  Location: [place] [facility];  Service: Cardiovascular;  Laterality: Left;  . Peripheral vascular catheterization N/A 04/25/2015    Procedure: Abdominal Aortogram w/Lower Extremity;  Surgeon: Algernon Huxley, MD;  Location: [place];  Service: Cardiovascular;  Laterality: N/A;  . Peripheral vascular catheterization  04/25/2015    Procedure: Lower Extremity Intervention;  Surgeon: Algernon Huxley, MD;  Location: [place] [facility];  Service: Cardiovascular;;    ALLERGIES:  is allergic to penapar-vk and sulfa antibiotics.  MEDICATIONS:  Current Facility-Administered Medications  Medication Dose Route Frequency Provider Last Rate Last Dose  . 0.9 %  sodium chloride infusion  250 mL Intravenous PRN Algernon Huxley, MD 75 mL/hr at 04/25/15 1633 1,000 mL at 04/25/15 1633  . acetaminophen (TYLENOL) tablet 650 mg  650 mg Oral Q6H PRN Algernon Huxley, MD       Or  . acetaminophen (TYLENOL) suppository 650 mg  650 mg Rectal Q6H PRN Algernon Huxley, MD      . alum & mag hydroxide-simeth (MAALOX/MYLANTA) 200-200-20 MG/5ML suspension 30 mL  30 mL Oral Q6H PRN Algernon Huxley, MD      . aspirin EC tablet 81 mg  81 mg Oral Daily Algernon Huxley, MD   81 mg at 04/29/15 0738  . docusate sodium (COLACE) capsule 100 mg  100 mg Oral BID Algernon Huxley, MD   100 mg at 04/29/15 0739  . DULoxetine  (CYMBALTA) DR capsule 30 mg  30 mg Oral Daily Algernon Huxley, MD   30 mg at 04/29/15 0738  . HYDROcodone-acetaminophen (NORCO/VICODIN) 5-325 MG per tablet 1-2 tablet  1-2 tablet Oral Q4H PRN Algernon Huxley, MD   2 tablet at 04/29/15 1153  . magnesium hydroxide (MILK OF MAGNESIA) suspension 30 mL  30 mL Oral Daily PRN Algernon Huxley, MD      . metoprolol succinate (TOPROL-XL) 24 hr tablet 25 mg  25 mg Oral Daily Algernon Huxley, MD   25 mg at 04/29/15 0739  . morphine 2 MG/ML injection 2 mg  2 mg Intravenous Q1H PRN Algernon Huxley, MD      . ondansetron [facility]) tablet 4 mg  4 mg Oral Q6H PRN Algernon Huxley, MD       Or  . ondansetron [facility]) injection 4 mg  4 mg Intravenous Q6H PRN Algernon Huxley, MD      . sodium chloride 0.9 % injection 3 mL  3 mL Intravenous Q12H Algernon Huxley, MD   3 mL at 04/29/15 0739  . sodium chloride 0.9 % injection 3 mL  3 mL Intravenous PRN Algernon Huxley, MD   3 mL at 04/27/15 2113  . traZODone (DESYREL) tablet 100 mg  100 mg Oral QHS Algernon Huxley, MD   100 mg at 04/28/15 2005    Vital Signs: BP 117/64 mmHg  Pulse 45  Temp(Src) 97.8 F (36.6 C) (Oral)  Resp 18  Ht 5\' 9"  (1.753 m)  Wt 78.019 kg (172 lb)  BMI 25.39 kg/m2  SpO2 95% Filed Weights   04/28/15 0500 04/28/15 1548 04/29/15 0457  Weight: 77.6 kg (171 lb 1.2 oz) 78.4 kg (172 lb 13.5 oz) 78.019 kg (172 lb)    Estimated body mass index is 25.39 kg/(m^2) as calculated from the following:   Height as of this encounter: 5\' 9"  (1.753 m).   Weight as of this encounter: 78.019 kg (172 lb).  PERFORMANCE STATUS (ECOG) : 4 - Bedbound  PHYSICAL EXAM: He is alert but a bit confused Having a recurrence of pain right now EOMI OP clear No JVD or TM Hrt rrr no m but with irreg beats Lungs cta  Ant Abd soft and NT Ext pale warm dry  Skin no cyanosis or mottling of RLE but LLE is cold with cyanosis of ankle and no pulses of left foot/ ankle  LABS: CBC:    Component Value Date/Time   WBC 16.2* 04/29/2015 0614   HGB 11.0*  04/29/2015 0614   HCT 34.1* 04/29/2015 0614   PLT 403 04/29/2015 0614   MCV 92.7 04/29/2015 0614   Comprehensive Metabolic Panel:    Component Value Date/Time   NA 138 04/28/2015 0442   K 4.1 04/28/2015 0442   CL 104 04/28/2015  0442   CO2 28 04/28/2015 0442   BUN 39* 04/28/2015 0442   CREATININE 2.14* 04/28/2015 0442   GLUCOSE 94 04/28/2015 0442   CALCIUM 8.3* 04/28/2015 0442       More than 50% of the visit was spent in counseling/coordination of care: Yes  Time Spent:  55 minutes

## 2015-04-29 NOTE — Care Management Important Message (Signed)
Important Message  Patient Details  Name: Philip [name] MRN: [id number] Date of Birth: [birth year]   Medicare Important Message Given:  Yes    Juliann Pulse A Tatem Holsonback 04/29/2015, 10:15 AM

## 2015-04-29 NOTE — Progress Notes (Signed)
Palliative Care Update  There was a palliative care consult requested, but it appears that pt has already been set up to have Hospice in the Home setting.  However, in talking with nursing, it appears he could use some more home Rxs for symptoms management. Nursing is concerned about pt's constipation problem (which happens very easily per report).  I have added a few meds to his DC list due to this request as part of my role as Optometrist.  See DC Med rec list.    Full note for brief consult to follow.  Colleen Can, MD Palliative Care

## 2015-04-29 NOTE — Discharge Summary (Signed)
[facility]    Discharge Summary    Patient ID:  Philip [name] MRN: [id number] DOB/AGE: [birth year] 82 y.o.  Admit date: 04/25/2015 Discharge date: 04/29/2015 Date of Surgery: 04/25/2015 Surgeon: Surgeon(s): Algernon Huxley, MD  Admission Diagnosis: Ischemic Leg Left Leg Ischemic ischemic leg  Discharge Diagnoses:  Ischemic Leg Left Leg Ischemic ischemic leg  Secondary Diagnoses: Past Medical History  Diagnosis Date  . Peripheral vascular disease ([place])   . CHF (congestive heart failure) ([place])   . Cancer ([place])   . GERD (gastroesophageal reflux disease)   . Blood dyscrasia   . DVT (deep venous thrombosis) ([facility])     Procedure(s): Abdominal Aortogram w/Lower Extremity Lower Extremity Intervention  Discharged Condition: good  HPI:  Patient with severe ischemia and ulcerations LLE  Hospital Course:  Philip [name] is a 82 y.o. male is S/P left Procedure(s): Abdominal Aortogram w/Lower Extremity Lower Extremity Intervention  Physical exam: left foot is ischemic, irreversible skin changes Post-op wounds access site C/D/I Pt. Ambulating, voiding and taking PO diet without difficulty. Pt pain controlled with PO pain meds. Labs as below Complications: irreversible ischemia.    Patient declined amputation and would prefer to go home with Hospice services  Consults:  Treatment Team:  Isaias Cowman, MD  Significant Diagnostic Studies: CBC Lab Results  Component Value Date   WBC 16.2* 04/29/2015   HGB 11.0* 04/29/2015   HCT 34.1* 04/29/2015   MCV 92.7 04/29/2015   PLT 403 04/29/2015    BMET    Component Value Date/Time   NA 138 04/28/2015 0442   K 4.1 04/28/2015 0442   CL 104 04/28/2015 0442   CO2 28 04/28/2015 0442   GLUCOSE 94 04/28/2015 0442   BUN 39* 04/28/2015 0442   CREATININE 2.14* 04/28/2015 0442   CALCIUM 8.3* 04/28/2015 0442   GFRNONAA 25* 04/28/2015 0442   GFRAA 29* 04/28/2015 0442   COAG Lab Results   Component Value Date   INR 1.28 04/25/2015     Disposition:  Discharge to :home with hospice  Discharge Instructions    For home use only DME oxygen    Complete by:  As directed   Mode or (Route):  Nasal cannula  Liters per Minute:  2  Frequency:  Continuous (stationary and portable oxygen unit needed)  Oxygen conserving device:  Yes  Oxygen delivery system:  Gas            Medication List    STOP taking these medications        aspirin 81 MG tablet     CENTRUM MEN PO     docusate sodium 100 MG capsule  Commonly known as:  COLACE     DULoxetine 30 MG capsule  Commonly known as:  CYMBALTA     ELIQUIS 2.5 MG Tabs tablet  Generic drug:  apixaban     furosemide 40 MG tablet  Commonly known as:  LASIX     lisinopril 2.5 MG tablet  Commonly known as:  PRINIVIL,ZESTRIL     Melatonin 1 MG Tabs     metoprolol succinate 25 MG 24 hr tablet  Commonly known as:  TOPROL-XL     ranitidine 300 MG capsule  Commonly known as:  ZANTAC     tamsulosin 0.4 MG Caps capsule  Commonly known as:  FLOMAX      TAKE these medications        HYDROcodone-acetaminophen 5-325 MG tablet  Commonly known as:  NORCO/VICODIN  Take  1-2 tablets by mouth every 4 (four) hours as needed for moderate pain.     traZODone 100 MG tablet  Commonly known as:  DESYREL  Take 100 mg by mouth at bedtime.       Verbal and written Discharge instructions given to the patient. Wound care per Discharge AVS   Signed: Alverna Fawley, MD  04/29/2015, 10:15 AM

## 2015-04-29 NOTE — Plan of Care (Signed)
Problem: Education: Goal: Knowledge of [facility] Education information/materials will improve Outcome: Progressing Pt has a very good understanding of his condition.  He is refusing amputation and states that he has lived a full life and accepts his mortality.       Problem: Safety: Goal: Ability to remain free from injury will improve Outcome: Progressing Pt remains free from injury.  Falls precautions in place.  Problem: Health Behavior/Discharge Planning: Goal: Ability to manage health-related needs will improve Outcome: Progressing Pt to be discharged to home with Hospice care.

## 2015-04-29 NOTE — Care Management (Signed)
Discharge to home today per Dr. Lucky Cowboy. Will be followed by [facility]. Transportation will be arranged thru [facility]. Family is in agreement with this plan Shelbie Ammons RN MSN CCM Care Management [phone]

## 2015-04-29 NOTE — Progress Notes (Signed)
Follow up visit made to new referral for [facility] services at home following discharge. Plan remains for patient to discharge hometoday via EMS. Patient's daughters Katharine Look and Manuela Schwartz along with son Richardson Landry present in the room. Family has not heard regarding hospital bed delivery, writer followed up with Hospice Triage. Bed to be delivered in the next few hours, family made aware. Daughter to notify [facility] Hassan Rowan when bed is delivered at that time EMS can be contacted for transfer. Staff RN Meghan made aware. Updated information faxed to referral. Thank you. Flo Shanks RN, BSN, [place] [facility] [place], [facility]  [phone] c

## 2015-05-02 ENCOUNTER — Ambulatory Visit: Payer: Medicare Other | Admitting: Surgery

## 2015-05-09 ENCOUNTER — Ambulatory Visit: Payer: Medicare Other | Admitting: Surgery

## 2015-05-16 ENCOUNTER — Ambulatory Visit: Payer: Medicare Other | Admitting: Surgery

## 2015-05-21 DEATH — deceased

## 2016-07-04 IMAGING — CR DG CHEST 2V
2 series · 2 of 2 positions shown · non-contrast
Comparison: 02/09/2007

CLINICAL DATA: Short of breath 4 days.

EXAM:
CHEST  2 VIEW

[chest pa]
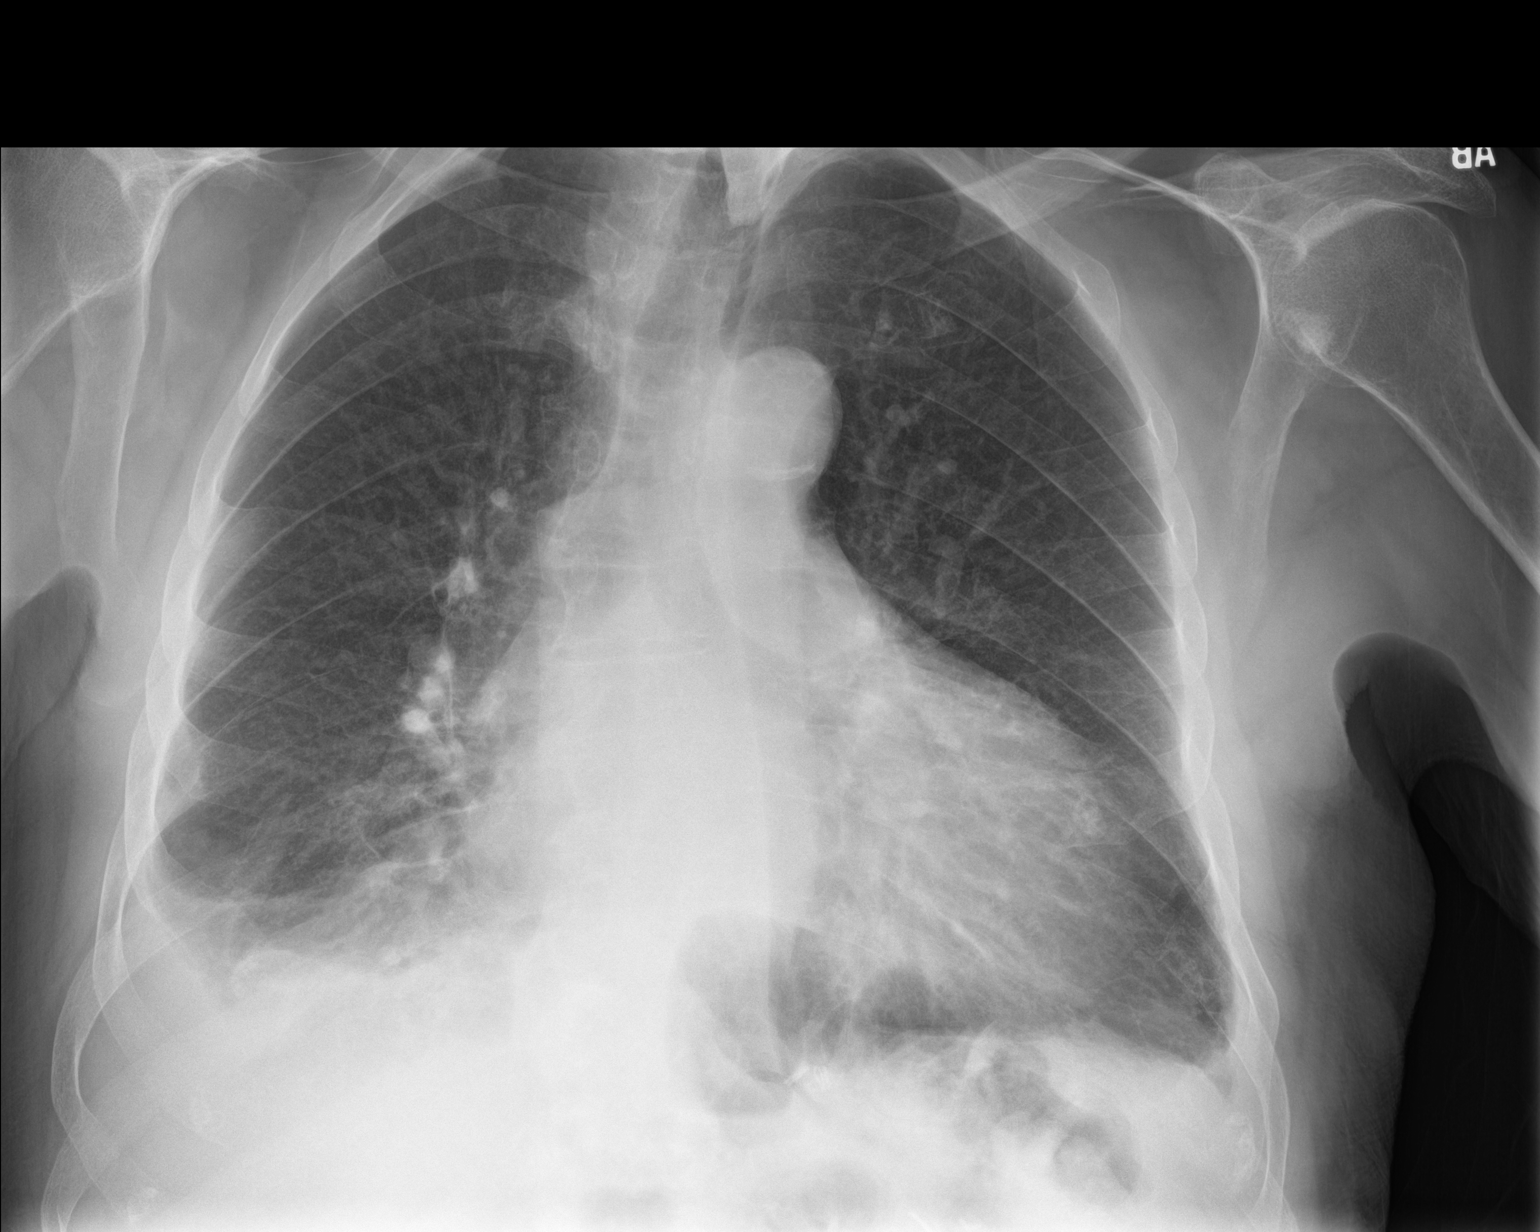

[chest lat]
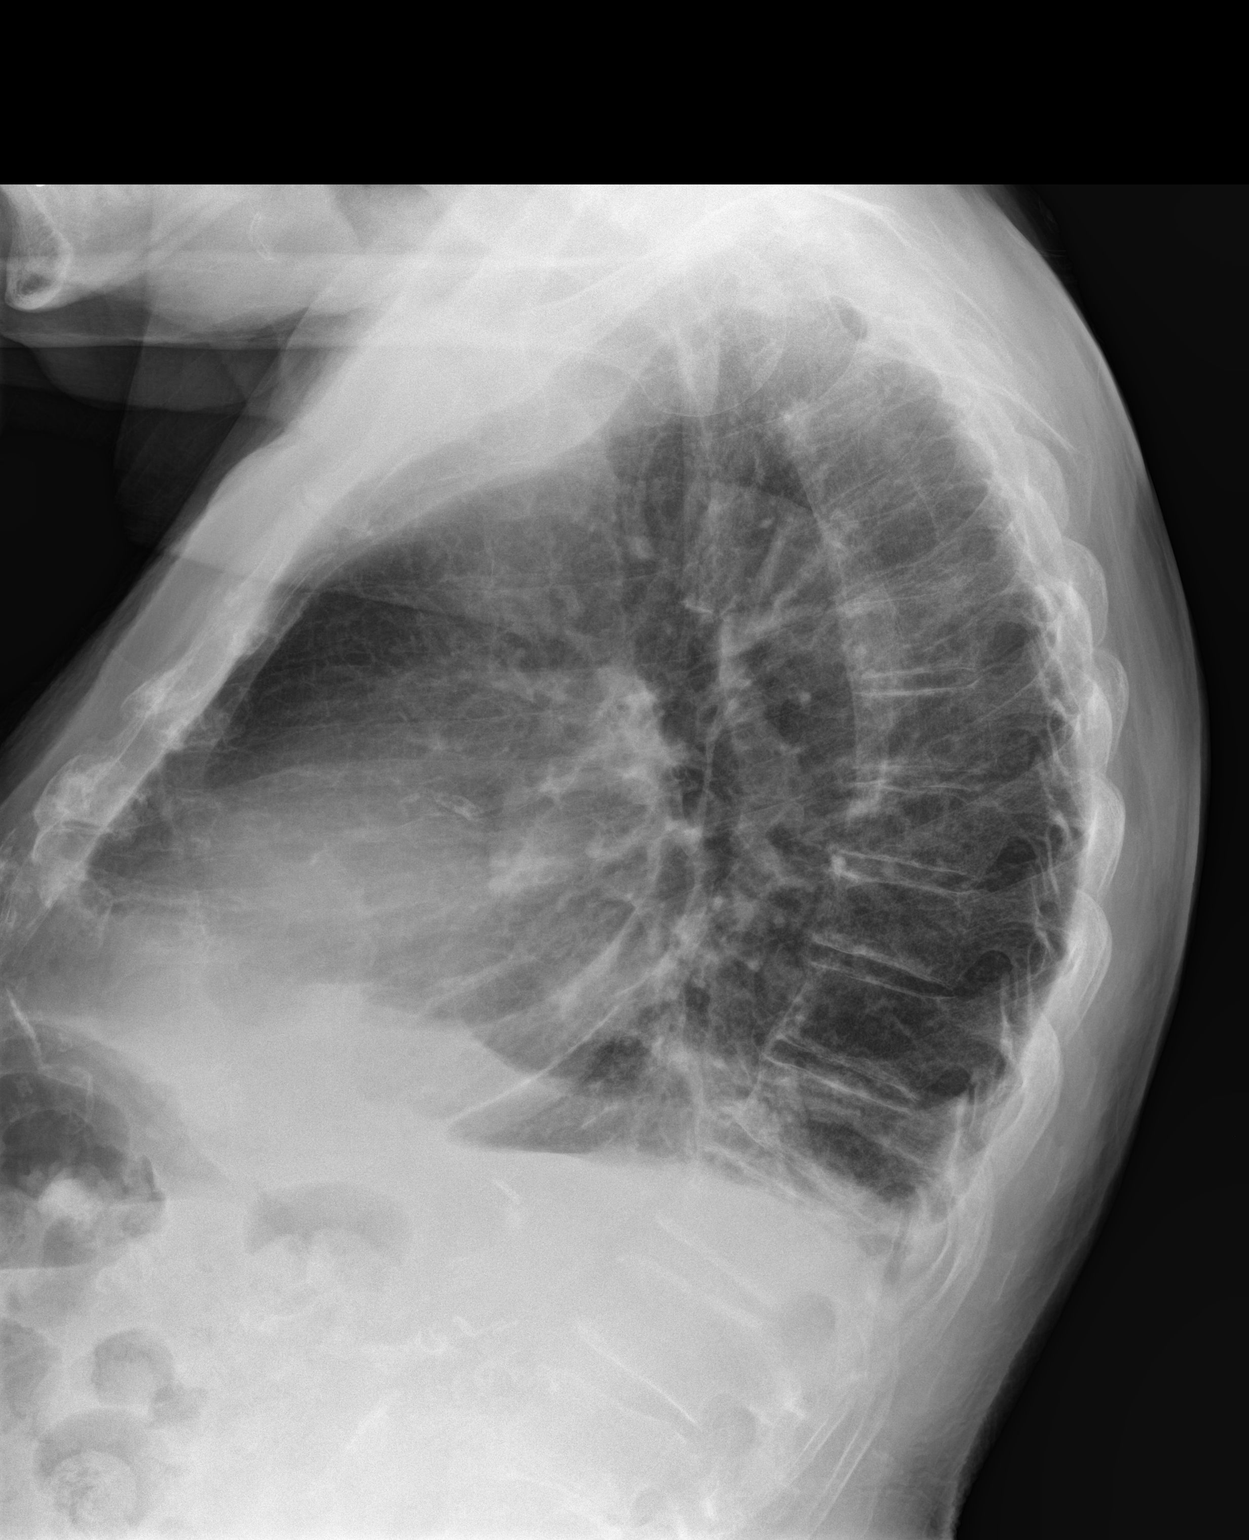

[2 of 2 positions shown; findings below may reference images not displayed]

FINDINGS: Cardiac enlargement. Mild vascular congestion with mild interstitial
edema. Small pleural effusions bilaterally. Bibasilar atelectasis.
IMPRESSION: Mild fluid overload.

## 2016-12-24 IMAGING — US US EXTREM LOW VENOUS*L*
1 series · 13 of 24 positions shown · non-contrast
Comparison: None.

CLINICAL DATA: Pain and swelling of left lower extremity for 1 day.



[Series 1: us extrem low venous*left* · 0.08mm/px · 13 of 33 slices shown]
[im 1/33]
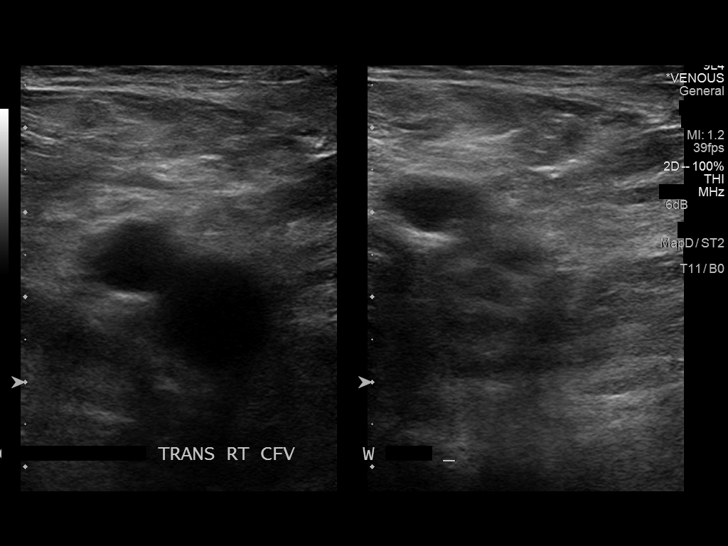
[im 3/33]
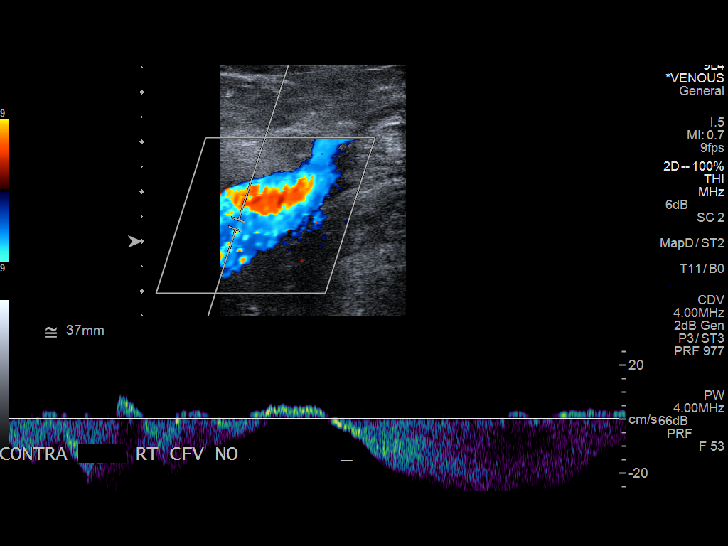
[im 6/33]
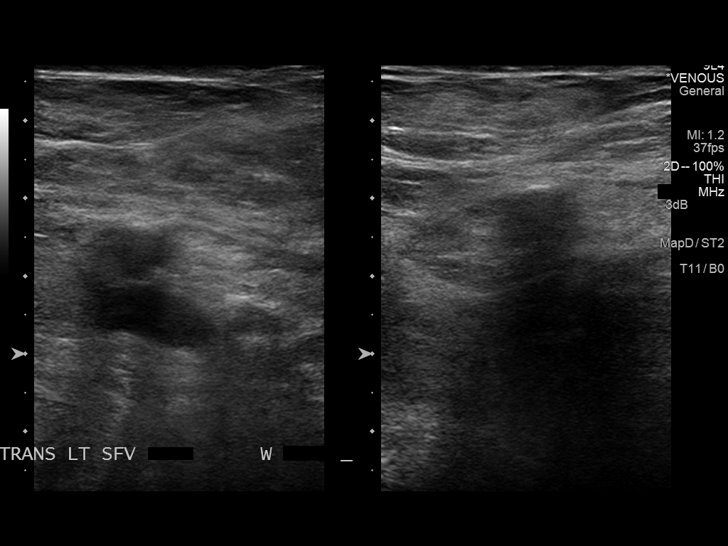
[im 9/33]
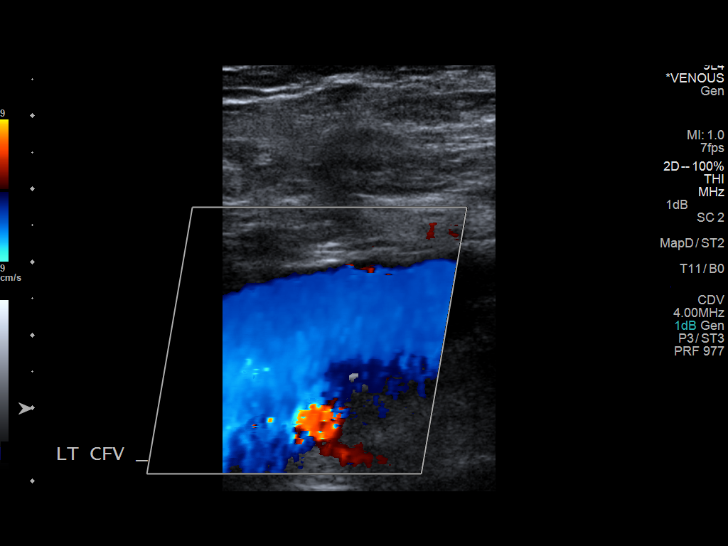
[im 12/33]
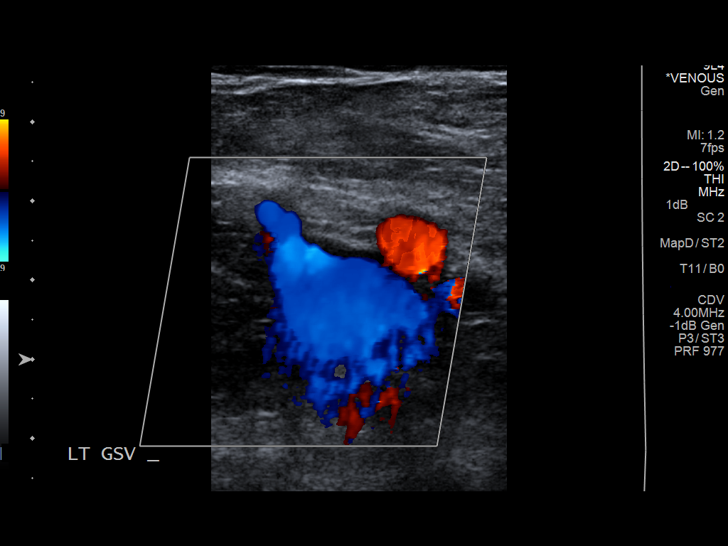
[im 14/33]
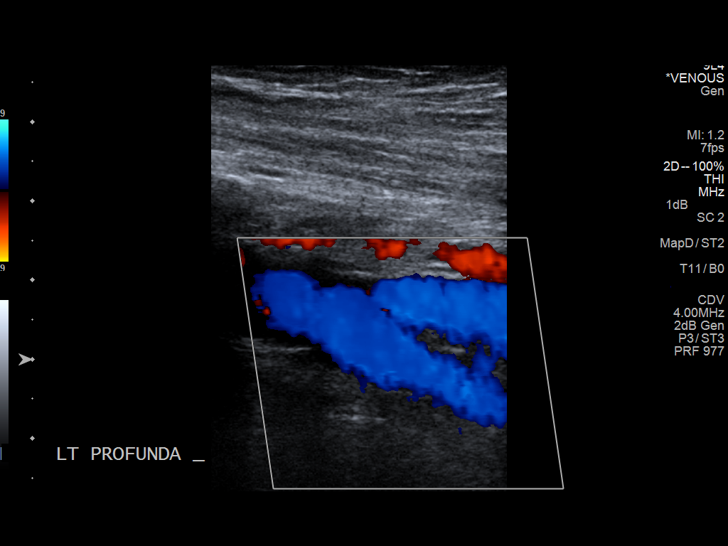
[im 17/33]
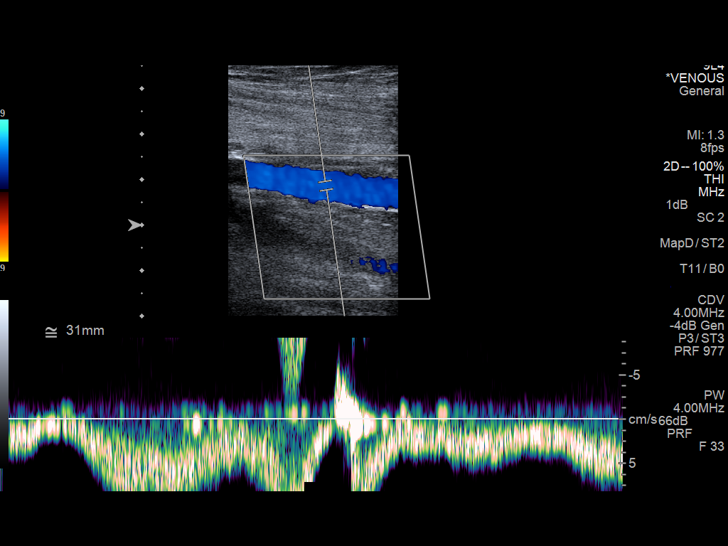
[im 19/33]
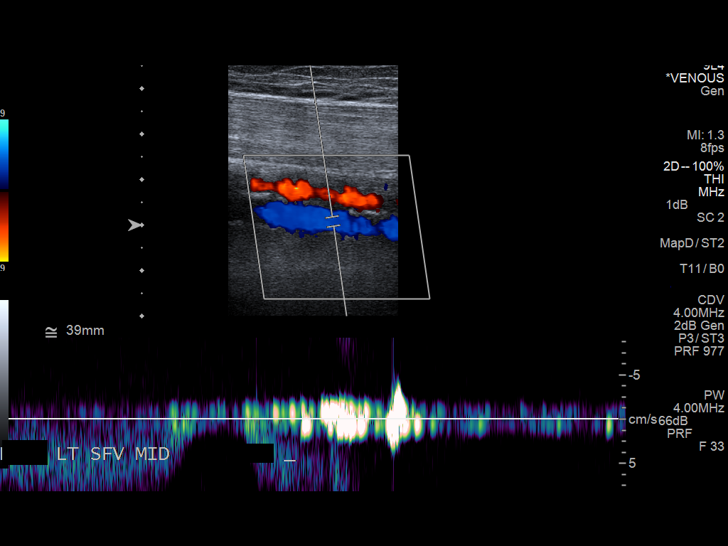
[im 21/33]
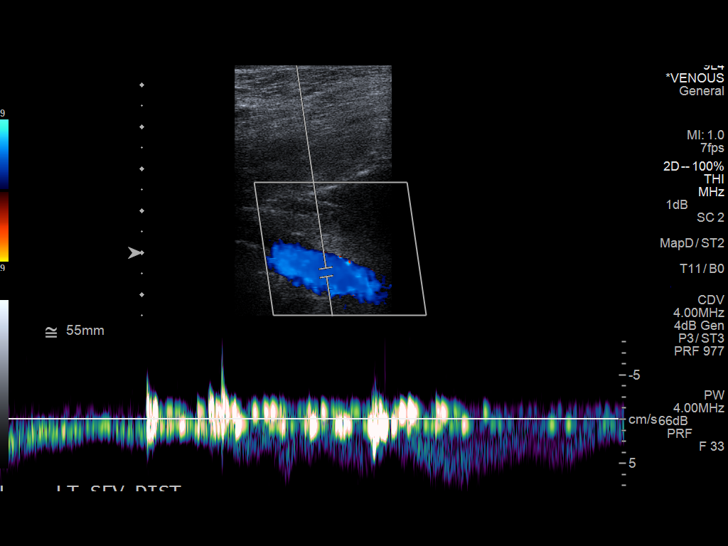
[im 24/33]
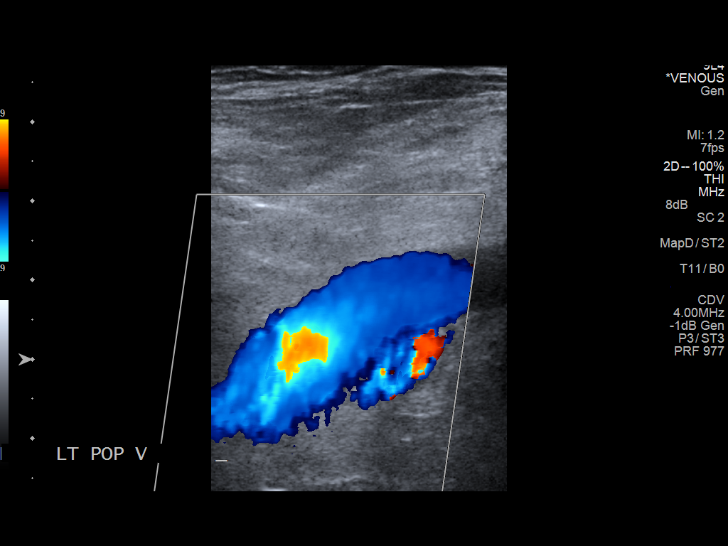
[im 27/33]
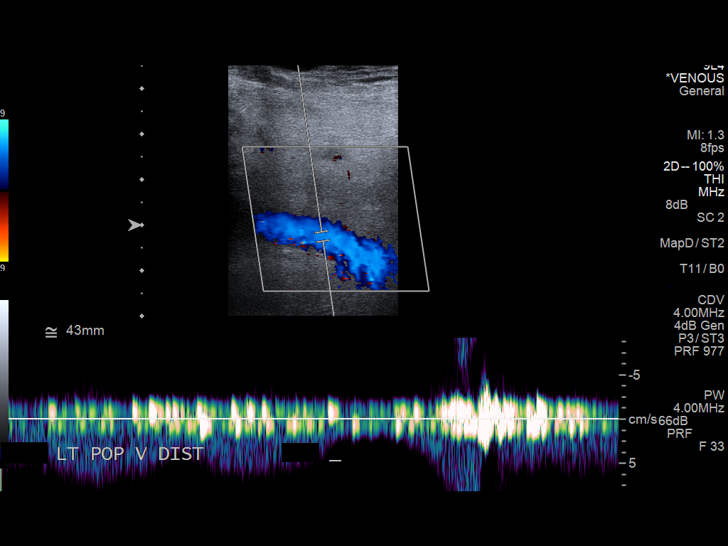
[im 30/33]
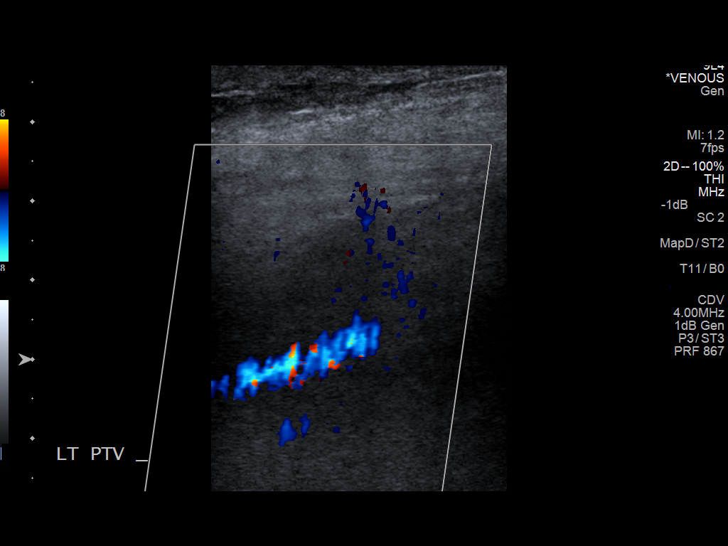
[im 33/33]
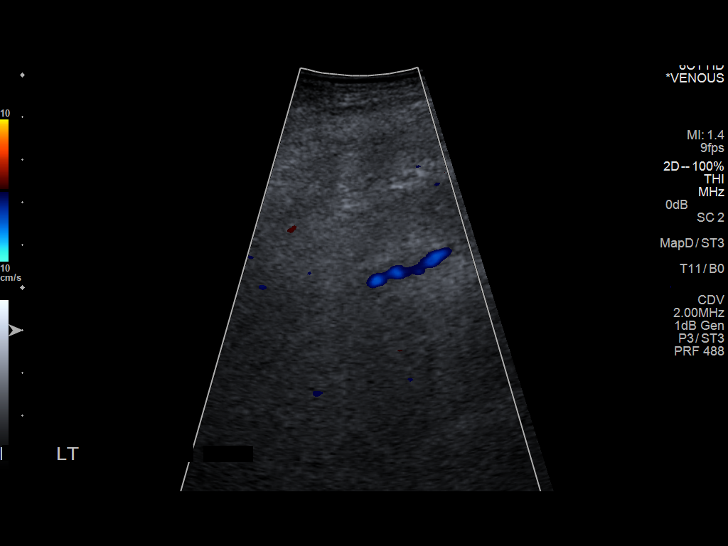

[13 of 24 positions shown; findings below may reference images not displayed]

FINDINGS: Contralateral Common Femoral Vein: Respiratory phasicity is normal
and symmetric with the symptomatic side. No evidence of thrombus.
Normal compressibility.

Common Femoral Vein: No evidence of thrombus. Normal
compressibility, respiratory phasicity and response to augmentation.

Saphenofemoral Junction: No evidence of thrombus. Normal
compressibility and flow on color Doppler imaging.

Profunda Femoral Vein: No evidence of thrombus. Normal
compressibility and flow on color Doppler imaging.

Femoral Vein: No evidence of thrombus. Normal compressibility,
respiratory phasicity and response to augmentation.

Popliteal Vein: No evidence of thrombus. Normal compressibility,
respiratory phasicity and response to augmentation.

Calf Veins: No evidence of thrombus. Left coronal vein not well
visualized.

Superficial Great Saphenous Vein: No evidence of thrombus. Normal
compressibility and flow on color Doppler imaging.

Venous Reflux:  None.

Other Findings:  None.
IMPRESSION: No evidence of deep venous thrombosis.
